# Patient Record
Sex: Male | Born: 1955 | Race: Black or African American | Hispanic: No | Marital: Single | Smoking: Never smoker
Health system: Southern US, Community
[De-identification: ages and names within clinical notes are randomized; demographics above are authoritative.]

## PROBLEM LIST (undated history)

## (undated) DIAGNOSIS — Z8719 Personal history of other diseases of the digestive system: Secondary | ICD-10-CM

## (undated) DIAGNOSIS — K859 Acute pancreatitis without necrosis or infection, unspecified: Secondary | ICD-10-CM

## (undated) DIAGNOSIS — I341 Nonrheumatic mitral (valve) prolapse: Secondary | ICD-10-CM

## (undated) DIAGNOSIS — G8929 Other chronic pain: Secondary | ICD-10-CM

## (undated) DIAGNOSIS — Z9289 Personal history of other medical treatment: Secondary | ICD-10-CM

## (undated) DIAGNOSIS — E785 Hyperlipidemia, unspecified: Secondary | ICD-10-CM

## (undated) DIAGNOSIS — R011 Cardiac murmur, unspecified: Secondary | ICD-10-CM

## (undated) DIAGNOSIS — I10 Essential (primary) hypertension: Secondary | ICD-10-CM

## (undated) DIAGNOSIS — D472 Monoclonal gammopathy: Secondary | ICD-10-CM

## (undated) DIAGNOSIS — S129XXA Fracture of neck, unspecified, initial encounter: Secondary | ICD-10-CM

## (undated) DIAGNOSIS — Z87448 Personal history of other diseases of urinary system: Secondary | ICD-10-CM

## (undated) DIAGNOSIS — D649 Anemia, unspecified: Secondary | ICD-10-CM

## (undated) DIAGNOSIS — R519 Headache, unspecified: Secondary | ICD-10-CM

## (undated) DIAGNOSIS — Z9889 Other specified postprocedural states: Secondary | ICD-10-CM

## (undated) DIAGNOSIS — G4730 Sleep apnea, unspecified: Secondary | ICD-10-CM

## (undated) DIAGNOSIS — I4891 Unspecified atrial fibrillation: Secondary | ICD-10-CM

## (undated) DIAGNOSIS — Z9981 Dependence on supplemental oxygen: Secondary | ICD-10-CM

## (undated) DIAGNOSIS — R51 Headache: Secondary | ICD-10-CM

## (undated) DIAGNOSIS — F419 Anxiety disorder, unspecified: Secondary | ICD-10-CM

## (undated) DIAGNOSIS — F1120 Opioid dependence, uncomplicated: Secondary | ICD-10-CM

## (undated) DIAGNOSIS — F32A Depression, unspecified: Secondary | ICD-10-CM

## (undated) DIAGNOSIS — C9000 Multiple myeloma not having achieved remission: Secondary | ICD-10-CM

## (undated) DIAGNOSIS — J9690 Respiratory failure, unspecified, unspecified whether with hypoxia or hypercapnia: Secondary | ICD-10-CM

## (undated) DIAGNOSIS — I509 Heart failure, unspecified: Secondary | ICD-10-CM

## (undated) DIAGNOSIS — M1990 Unspecified osteoarthritis, unspecified site: Secondary | ICD-10-CM

## (undated) DIAGNOSIS — I5022 Chronic systolic (congestive) heart failure: Secondary | ICD-10-CM

## (undated) DIAGNOSIS — F329 Major depressive disorder, single episode, unspecified: Secondary | ICD-10-CM

## (undated) DIAGNOSIS — E119 Type 2 diabetes mellitus without complications: Secondary | ICD-10-CM

## (undated) HISTORY — DX: Hyperlipidemia, unspecified: E78.5

## (undated) HISTORY — DX: Personal history of other diseases of the digestive system: Z87.19

## (undated) HISTORY — DX: Respiratory failure, unspecified, unspecified whether with hypoxia or hypercapnia: J96.90

## (undated) HISTORY — DX: Opioid dependence, uncomplicated: F11.20

## (undated) HISTORY — DX: Morbid (severe) obesity due to excess calories: E66.01

## (undated) HISTORY — PX: PANCREAS SURGERY: SHX731

## (undated) HISTORY — PX: HERNIA REPAIR: SHX51

## (undated) HISTORY — PX: CHOLECYSTECTOMY: SHX55

## (undated) HISTORY — DX: Acute pancreatitis without necrosis or infection, unspecified: K85.90

## (undated) HISTORY — DX: Personal history of other diseases of urinary system: Z87.448

## (undated) HISTORY — PX: VENTRAL HERNIA REPAIR: SHX424

## (undated) HISTORY — DX: Other chronic pain: G89.29

## (undated) HISTORY — DX: Other specified postprocedural states: Z98.890

## (undated) HISTORY — DX: Heart failure, unspecified: I50.9

## (undated) HISTORY — DX: Unspecified osteoarthritis, unspecified site: M19.90

## (undated) HISTORY — DX: Cardiac murmur, unspecified: R01.1

## (undated) HISTORY — DX: Fracture of neck, unspecified, initial encounter: S12.9XXA

## (undated) HISTORY — PX: SKIN GRAFT: SHX250

## (undated) HISTORY — PX: CATARACT EXTRACTION W/ INTRAOCULAR LENS  IMPLANT, BILATERAL: SHX1307

## (undated) HISTORY — DX: Unspecified atrial fibrillation: I48.91

---

## 2005-03-23 DIAGNOSIS — Z9289 Personal history of other medical treatment: Secondary | ICD-10-CM

## 2005-03-23 HISTORY — PX: TRACHEOSTOMY: SUR1362

## 2005-03-23 HISTORY — DX: Personal history of other medical treatment: Z92.89

## 2006-10-22 HISTORY — PX: INSERTION OF MESH: SHX5868

## 2009-09-23 ENCOUNTER — Inpatient Hospital Stay (HOSPITAL_COMMUNITY): Admission: EM | Admit: 2009-09-23 | Discharge: 2009-09-27 | Payer: Self-pay | Admitting: Emergency Medicine

## 2010-02-12 ENCOUNTER — Encounter (INDEPENDENT_AMBULATORY_CARE_PROVIDER_SITE_OTHER): Payer: Self-pay | Admitting: *Deleted

## 2010-02-12 ENCOUNTER — Inpatient Hospital Stay (HOSPITAL_COMMUNITY): Admission: EM | Admit: 2010-02-12 | Discharge: 2010-02-17 | Payer: Self-pay | Admitting: Emergency Medicine

## 2010-02-17 ENCOUNTER — Encounter (INDEPENDENT_AMBULATORY_CARE_PROVIDER_SITE_OTHER): Payer: Self-pay | Admitting: *Deleted

## 2010-03-03 ENCOUNTER — Telehealth: Payer: Self-pay | Admitting: Internal Medicine

## 2010-03-11 ENCOUNTER — Inpatient Hospital Stay (HOSPITAL_COMMUNITY)
Admission: EM | Admit: 2010-03-11 | Discharge: 2010-03-18 | Disposition: A | Discharge status: Other Acute Inpatient Hospital | Payer: Self-pay | Source: Home / Self Care | Attending: Internal Medicine | Admitting: Internal Medicine

## 2010-03-13 ENCOUNTER — Encounter (INDEPENDENT_AMBULATORY_CARE_PROVIDER_SITE_OTHER): Payer: Self-pay | Admitting: Internal Medicine

## 2010-04-24 NOTE — Discharge Summary (Signed)
Summary: dc summary   Don Brown, Don Brown                 ACCOUNT NO.:  [id number]      MEDICAL RECORD NO.:  [id number]          PATIENT TYPE:  INP      LOCATION:  1336                         FACILITY:  [facility]      PHYSICIAN:  Erick Blinks, MD     DATE OF BIRTH:  July 08, 1955      DATE OF ADMISSION:  02/12/2010   DATE OF DISCHARGE:  02/17/2010                            DISCHARGE SUMMARY - REFERRING         DISCHARGE DIAGNOSES:   1. Acute-on-chronic pancreatitis.   2. Acute gastritis.   3. Chronic pancreatic pseudocyst.   4. Hypertension.   5. Chronic atrial fibrillation.   6. History of congestive heart failure, type unknown, compensated.   7. Acute-on-chronic pain.      DISCHARGE DIAGNOSES:   1. Protonix 40 mg p.o. q.h.s.   2. Metoprolol XL succinate 50 mg p.o. q.a.m.   3. Digoxin 0.125 mg 1 tablet p.o. daily.   4. Tramadol 50 mg p.o. t.i.d. p.r.n.   5. Pancrease 10 1 capsule p.o. t.i.d. with meals.   6. Vicodin 5/500 mg 1-2 tablets p.o. q.6 h. p.r.n.   7. Lasix 40 mg 1 tablet p.o. q.a.m.   8. Lisinopril 10 mg p.o. daily.   9. Advil 200 mg 2 tablets p.o. q.6 h. p.r.n.      BRIEF ADMISSION HISTORY:  This is a 55 year old gentleman with history   of chronic pancreatitis and chronic atrial fibrillation, who presents to   the hospital with abdominal pain.  The patient woke up on the morning of   admission with significant abdominal pain, which was diffuse in nature,   but more so located on the left side.  He reports that this is a regular   pancreatitis pain.  The patient was having nausea as well.  He reported   to the ER, where CT scan was done which showed possible pancreatitis.  He   also had an elevated lipase level.  He was subsequently admitted   for further evaluation.  For further details, please refer the history   and physical dictated on November 23rd.      HOSPITAL COURSE:   1. Acute-on-chronic pancreatitis.  The patient was initially kept       n.p.o., was  given IV fluids.  His diet was subsequently advanced.       His lipase returned to normal level.  He was able to tolerate p.o.       diet and his pancreatitis subsequently resolved.   2. Chronic pancreatic pseudocyst.  This was found on CT scan.  The       patient reports that since his last discharge in July where he was       found to have a pseudocyst of 7 cm.  He had followed up at [facility] with his primary gastroenterologist.  He       reports that pancreatic stent was placed at that time and his cyst  had completely resolved.  Since that time, his stent was removed       and upon presentation during this hospitalization, CT abdomen shows       a recurrence of his pseudocyst at approximately 2.9 x 2.2 cm.  This       was discussed with his primary gastroenterologist at [facility].  It was recommended that once the patient is       discharged from the hospital, he should have a repeat CT scan in       the next 3-4 weeks to evaluate to see if the pseudocyst is       progressively enlarging.  It was recommended that if he wishes he       could follow up at [place] although another option will be to       follow up at [facility] where another stent could       be placed for his pseudocyst if necessary.  This was explained to       the patient.  He does understand this.  A copy of the CT scan       report was also given him.   3. Acute-on-chronic pain.  The patient has significant pain.  He was       using IV narcotics to relieve this.  He was advised that as this       pain began to improve, he should be transitioned over to oral       narcotics.  The patient did not agree to do this.  He adamantly       requested IV pain medicine while he was here in the hospital.  He       was explained that it would not be in his benefit to continue with       IV pain medication as he cannot be discharged home on this.  He        reported that he would deal with oral pain medication when he went       home, but while he was here in the hospital he demanded IV pain       medication.  He said that the oral pain medication made him       increasingly groggy.  A smaller dose of oral pain medication was       offered.  The patient did not want this either.  During his       hospital course, the patient often refused frequent tests.  He       refused any blood draws, any blood cultures.  He refused an x-ray       when he was found to have a mild temperature.  The patient       repeatedly asked for his IV pain medication.  On November 28th on       the day of discharge, the patient was offered only p.o. pain       medication for his pain.  He requested discharge on this day.  The       patient is tolerating an oral diet.  He is consuming 100% of his       tray.  He is ambulating without any assistance.  His vitals were       stable.  His lab work did not show any acute abnormalities and he       appears to be back to  his baseline.  We will discharge him home       today to follow up with the primary care physician and he will be       discharged on oral narcotics.   4. Gastritis.  The patient was maintained on a proton pump inhibitor       while here in the hospital.   5. Chronic atrial fibrillation.  This had remained rate control during       his hospital stay.  The patient is not on Coumadin.      CONSULTATIONS:  None.      PROCEDURES:  None.      DIAGNOSTIC IMAGING:  CT abdomen, pelvis from November 23rd shows a   similar appearance of the pancreatic body and tail favored to represent   findings of chronic calcific pancreatitis given relative normal   appearance of pancreatic head, suspected stricture at the pancreatic   neck, decreased size of peripancreatic fluid, dense lesion likely a   pseudocyst, moderate gastric wall thickening, highly suspicious for   gastritis, mild edema adjacent to the gastric antrum  and pancreatic   neck, although this could be chronic, a component of acute pancreatitis   cannot be excluded, chronic insufficiency/thrombus in the splenic vein   and splenoportal confluence with increase in gastroepiploic collaterals,   hepatic steatosis with probable perfusion anomaly, mild small bowel   adynamic ileus with similar small bowel containing ventral abdominal   wall laxity, suspect LAD coronary artery atherosclerosis.      DISCHARGE INSTRUCTIONS:  The patient should continue on a low-calorie,   low-fat diet.  Conduct his activity as tolerated.  He will need to   follow up with a primary care physician in the next 1-2 weeks.  He will   also need to follow up with his gastroenterologist with a repeat CT scan   in the next 3-4 weeks if he chooses.  He can also follow up at [facility] for the same reason.  Time spent in coordinating this   discharge is 45 minutes.               Erick Blinks, MD            JM/MEDQ  D:  02/17/2010  T:  02/17/2010  Job:  [id number]      Electronically Signed by Durward Mallard MEMON  on 02/26/2010 08:33:00 PM

## 2010-04-24 NOTE — Progress Notes (Signed)
Summary: triage  Phone Note Call from Patient Call back at [phone]   Caller: Patient Call For: Dr. Juanda Chance (doctor of the day) Reason for Call: Talk to Nurse Summary of Call: pt was recently d/c'ed from [facility] with pancreatic problems... was advised to f/u with GI specialist... no GI hx in [place]... says January is too far away for his condition and being out of work due to problem Initial call taken by: Vallarie Mare,  March 03, 2010 3:28 PM  Follow-up for Phone Call        Spoke with pt and retrieved his recent hospital records from Nov., 2011. Also found records of a consult with Dr Evette Cristal from July, 2011. Informed the patient we cannot see him since he was seen at [facility] this year. Pt was very gracious and will call Dr Luan Moore office. Graciella Freer, RN 03/03/10 @ 1615 Follow-up by: Jesse Fall RN,  March 03, 2010 4:18 PM

## 2010-06-02 LAB — COMPREHENSIVE METABOLIC PANEL
ALT: 12 U/L (ref 0–53)
BUN: 9 mg/dL (ref 6–23)
CO2: 27 mEq/L (ref 19–32)
CO2: 29 mEq/L (ref 19–32)
Calcium: 8.2 mg/dL — ABNORMAL LOW (ref 8.4–10.5)
Calcium: 9.1 mg/dL (ref 8.4–10.5)
Chloride: 111 mEq/L (ref 96–112)
Creatinine, Ser: 0.82 mg/dL (ref 0.4–1.5)
Creatinine, Ser: 0.84 mg/dL (ref 0.4–1.5)
GFR calc non Af Amer: >60 mL/min (ref >60)
GFR calc non Af Amer: >60 mL/min (ref >60)
Glucose, Bld: 102 mg/dL — ABNORMAL HIGH (ref 70–99)
Glucose, Bld: 80 mg/dL (ref 70–99)
Sodium: 144 mEq/L (ref 135–145)
Sodium: 145 mEq/L (ref 135–145)
Total Bilirubin: 0.8 mg/dL (ref 0.3–1.2)
Total Protein: 6.6 g/dL (ref 6.0–8.3)

## 2010-06-02 LAB — CBC
HCT: 35.8 % — ABNORMAL LOW (ref 39.0–52.0)
HCT: 42.1 % (ref 39.0–52.0)
Hemoglobin: 11.3 g/dL — ABNORMAL LOW (ref 13.0–17.0)
Hemoglobin: 11.6 g/dL — ABNORMAL LOW (ref 13.0–17.0)
Hemoglobin: 12.7 g/dL — ABNORMAL LOW (ref 13.0–17.0)
Hemoglobin: 13.4 g/dL (ref 13.0–17.0)
MCH: 28 pg (ref 26.0–34.0)
MCH: 28.1 pg (ref 26.0–34.0)
MCH: 28.5 pg (ref 26.0–34.0)
MCHC: 31.3 g/dL (ref 30.0–36.0)
MCHC: 31.6 g/dL (ref 30.0–36.0)
MCHC: 31.8 g/dL (ref 30.0–36.0)
MCV: 88.6 fL (ref 78.0–100.0)
Platelets: 126 10*3/uL — ABNORMAL LOW (ref 150–400)
Platelets: 99 10*3/uL — ABNORMAL LOW (ref 150–400)
RBC: 4.19 MIL/uL — ABNORMAL LOW (ref 4.22–5.81)
RBC: 4.46 MIL/uL (ref 4.22–5.81)
RDW: 16.2 % — ABNORMAL HIGH (ref 11.5–15.5)
RDW: 17 % — ABNORMAL HIGH (ref 11.5–15.5)
WBC: 3.2 10*3/uL — ABNORMAL LOW (ref 4.0–10.5)
WBC: 3.9 10*3/uL — ABNORMAL LOW (ref 4.0–10.5)

## 2010-06-02 LAB — BASIC METABOLIC PANEL
BUN: 3 mg/dL — ABNORMAL LOW (ref 6–23)
CO2: 34 mEq/L — ABNORMAL HIGH (ref 19–32)
Calcium: 8.4 mg/dL (ref 8.4–10.5)
Calcium: 8.6 mg/dL (ref 8.4–10.5)
Creatinine, Ser: 0.91 mg/dL (ref 0.4–1.5)
GFR calc Af Amer: >60 mL/min (ref >60)
GFR calc non Af Amer: >60 mL/min (ref >60)
GFR calc non Af Amer: >60 mL/min (ref >60)
Glucose, Bld: 102 mg/dL — ABNORMAL HIGH (ref 70–99)
Potassium: 3.7 mEq/L (ref 3.5–5.1)
Sodium: 142 mEq/L (ref 135–145)
Sodium: 143 mEq/L (ref 135–145)

## 2010-06-02 LAB — URINALYSIS, ROUTINE W REFLEX MICROSCOPIC
Bilirubin Urine: NEGATIVE
Glucose, UA: NEGATIVE mg/dL
Hgb urine dipstick: NEGATIVE
Ketones, ur: NEGATIVE mg/dL
pH: 5.5 (ref 5.0–8.0)

## 2010-06-02 LAB — LACTIC ACID, PLASMA: Lactic Acid, Venous: 1 mmol/L (ref 0.5–2.2)

## 2010-06-02 LAB — LIPASE, BLOOD
Lipase: 145 U/L — ABNORMAL HIGH (ref 11–59)
Lipase: 427 U/L — ABNORMAL HIGH (ref 11–59)
Lipase: 46 U/L (ref 11–59)

## 2010-06-02 LAB — BRAIN NATRIURETIC PEPTIDE: Pro B Natriuretic peptide (BNP): 528 pg/mL — ABNORMAL HIGH (ref 0.0–100.0)

## 2010-06-02 LAB — DIFFERENTIAL
Basophils Absolute: 0 10*3/uL (ref 0.0–0.1)
Eosinophils Absolute: 0.1 10*3/uL (ref 0.0–0.7)
Lymphs Abs: 0.7 10*3/uL (ref 0.7–4.0)
Neutrophils Relative %: 69 % (ref 43–77)

## 2010-06-02 LAB — POCT CARDIAC MARKERS: Troponin i, poc: <0.05 ng/mL (ref 0.00–0.09)

## 2010-06-02 LAB — MAGNESIUM: Magnesium: 1.6 mg/dL (ref 1.5–2.5)

## 2010-06-03 LAB — CBC
HCT: 43 % (ref 39.0–52.0)
Hemoglobin: 12.5 g/dL — ABNORMAL LOW (ref 13.0–17.0)
Hemoglobin: 14.2 g/dL (ref 13.0–17.0)
MCH: 28.3 pg (ref 26.0–34.0)
MCH: 28.5 pg (ref 26.0–34.0)
MCH: 28.8 pg (ref 26.0–34.0)
MCHC: 32.5 g/dL (ref 30.0–36.0)
MCHC: 32.9 g/dL (ref 30.0–36.0)
MCV: 86.5 fL (ref 78.0–100.0)
MCV: 86.7 fL (ref 78.0–100.0)
MCV: 87.3 fL (ref 78.0–100.0)
Platelets: 124 10*3/uL — ABNORMAL LOW (ref 150–400)
Platelets: 151 10*3/uL (ref 150–400)
RBC: 4.34 MIL/uL (ref 4.22–5.81)
RBC: 4.67 MIL/uL (ref 4.22–5.81)
RDW: 17.9 % — ABNORMAL HIGH (ref 11.5–15.5)
RDW: 18 % — ABNORMAL HIGH (ref 11.5–15.5)
WBC: 3.2 10*3/uL — ABNORMAL LOW (ref 4.0–10.5)

## 2010-06-03 LAB — BASIC METABOLIC PANEL
BUN: 6 mg/dL (ref 6–23)
Calcium: 8 mg/dL — ABNORMAL LOW (ref 8.4–10.5)
Chloride: 109 mEq/L (ref 96–112)
Creatinine, Ser: 0.77 mg/dL (ref 0.4–1.5)

## 2010-06-03 LAB — COMPREHENSIVE METABOLIC PANEL
AST: 16 U/L (ref 0–37)
AST: 18 U/L (ref 0–37)
Albumin: 3.4 g/dL — ABNORMAL LOW (ref 3.5–5.2)
CO2: 28 mEq/L (ref 19–32)
Calcium: 8.3 mg/dL — ABNORMAL LOW (ref 8.4–10.5)
Chloride: 101 mEq/L (ref 96–112)
Chloride: 105 mEq/L (ref 96–112)
Creatinine, Ser: 0.98 mg/dL (ref 0.4–1.5)
Creatinine, Ser: 1.05 mg/dL (ref 0.4–1.5)
GFR calc Af Amer: >60 mL/min (ref >60)
GFR calc Af Amer: >60 mL/min (ref >60)
GFR calc non Af Amer: >60 mL/min (ref >60)
Sodium: 140 mEq/L (ref 135–145)
Total Bilirubin: 0.8 mg/dL (ref 0.3–1.2)

## 2010-06-03 LAB — LIPASE, BLOOD
Lipase: 132 U/L — ABNORMAL HIGH (ref 11–59)
Lipase: 674 U/L — ABNORMAL HIGH (ref 11–59)

## 2010-06-03 LAB — URINALYSIS, ROUTINE W REFLEX MICROSCOPIC
Bilirubin Urine: NEGATIVE
Glucose, UA: NEGATIVE mg/dL
Glucose, UA: NEGATIVE mg/dL
Hgb urine dipstick: NEGATIVE
Hgb urine dipstick: NEGATIVE
Leukocytes, UA: NEGATIVE
Nitrite: NEGATIVE
Protein, ur: 30 mg/dL — AB
Specific Gravity, Urine: 1.01 (ref 1.005–1.030)
Specific Gravity, Urine: >1.046 — ABNORMAL HIGH (ref 1.005–1.030)
Urobilinogen, UA: 1 mg/dL (ref 0.0–1.0)
pH: 6 (ref 5.0–8.0)

## 2010-06-03 LAB — CULTURE, BLOOD (ROUTINE X 2)
Culture  Setup Time: 201111251051
Culture: NO GROWTH

## 2010-06-03 LAB — DIFFERENTIAL
Basophils Absolute: 0 10*3/uL (ref 0.0–0.1)
Basophils Relative: 1 % (ref 0–1)
Eosinophils Absolute: 0.1 10*3/uL (ref 0.0–0.7)
Eosinophils Relative: 1 % (ref 0–5)
Monocytes Absolute: 0.2 10*3/uL (ref 0.1–1.0)
Monocytes Relative: 6 % (ref 3–12)

## 2010-06-03 LAB — HEPATIC FUNCTION PANEL
Albumin: 3.5 g/dL (ref 3.5–5.2)
Alkaline Phosphatase: 52 U/L (ref 39–117)
Bilirubin, Direct: 0.3 mg/dL (ref 0.0–0.3)
Indirect Bilirubin: 0.6 mg/dL (ref 0.3–0.9)
Total Bilirubin: 0.9 mg/dL (ref 0.3–1.2)

## 2010-06-03 LAB — URINE CULTURE: Special Requests: NEGATIVE

## 2010-06-03 LAB — URINE MICROSCOPIC-ADD ON

## 2010-06-08 LAB — COMPREHENSIVE METABOLIC PANEL
ALT: 17 U/L (ref 0–53)
AST: 12 U/L (ref 0–37)
AST: 20 U/L (ref 0–37)
AST: 20 U/L (ref 0–37)
Albumin: 3.3 g/dL — ABNORMAL LOW (ref 3.5–5.2)
Albumin: 3.7 g/dL (ref 3.5–5.2)
Alkaline Phosphatase: 47 U/L (ref 39–117)
Alkaline Phosphatase: 53 U/L (ref 39–117)
BUN: 9 mg/dL (ref 6–23)
CO2: 29 mEq/L (ref 19–32)
Calcium: 9.3 mg/dL (ref 8.4–10.5)
Chloride: 107 mEq/L (ref 96–112)
Chloride: 110 mEq/L (ref 96–112)
Creatinine, Ser: 0.77 mg/dL (ref 0.4–1.5)
Creatinine, Ser: 0.83 mg/dL (ref 0.4–1.5)
GFR calc Af Amer: >60 mL/min (ref >60)
GFR calc Af Amer: >60 mL/min (ref >60)
GFR calc Af Amer: >60 mL/min (ref >60)
GFR calc non Af Amer: >60 mL/min (ref >60)
Potassium: 3.8 mEq/L (ref 3.5–5.1)
Potassium: 4.1 mEq/L (ref 3.5–5.1)
Sodium: 142 mEq/L (ref 135–145)
Total Bilirubin: 0.7 mg/dL (ref 0.3–1.2)
Total Protein: 7.5 g/dL (ref 6.0–8.3)
Total Protein: 8.6 g/dL — ABNORMAL HIGH (ref 6.0–8.3)

## 2010-06-08 LAB — CBC
Hemoglobin: 12.1 g/dL — ABNORMAL LOW (ref 13.0–17.0)
MCH: 28.5 pg (ref 26.0–34.0)
MCHC: 33.3 g/dL (ref 30.0–36.0)
MCV: 87.7 fL (ref 78.0–100.0)
Platelets: 126 10*3/uL — ABNORMAL LOW (ref 150–400)
Platelets: 146 10*3/uL — ABNORMAL LOW (ref 150–400)
RBC: 4.26 MIL/uL (ref 4.22–5.81)
RBC: 4.36 MIL/uL (ref 4.22–5.81)
RBC: 4.96 MIL/uL (ref 4.22–5.81)
RDW: 16.4 % — ABNORMAL HIGH (ref 11.5–15.5)
RDW: 16.9 % — ABNORMAL HIGH (ref 11.5–15.5)
WBC: 4.2 10*3/uL (ref 4.0–10.5)

## 2010-06-08 LAB — BLOOD GAS, ARTERIAL
Acid-base deficit: 1.9 mmol/L (ref 0.0–2.0)
Bicarbonate: 27.4 mEq/L — ABNORMAL HIGH (ref 20.0–24.0)
Bicarbonate: 29.3 mEq/L — ABNORMAL HIGH (ref 20.0–24.0)
Drawn by: 275531
FIO2: 0.21 %
FIO2: 100 %
O2 Saturation: 92.6 %
O2 Saturation: 98.3 %
TCO2: 27.6 mmol/L (ref 0–100)
pCO2 arterial: 38.9 mmHg (ref 35.0–45.0)
pCO2 arterial: 85.6 mmHg (ref 35.0–45.0)
pH, Arterial: 7.462 — ABNORMAL HIGH (ref 7.350–7.450)
pO2, Arterial: 184 mmHg — ABNORMAL HIGH (ref 80.0–100.0)
pO2, Arterial: 60.4 mmHg — ABNORMAL LOW (ref 80.0–100.0)

## 2010-06-08 LAB — LIPID PANEL
Cholesterol: 98 mg/dL (ref 0–200)
HDL: 29 mg/dL — ABNORMAL LOW (ref >39)
Total CHOL/HDL Ratio: 3.4 RATIO
Triglycerides: 81 mg/dL (ref <150)

## 2010-06-08 LAB — URINALYSIS, ROUTINE W REFLEX MICROSCOPIC
Bilirubin Urine: NEGATIVE
Glucose, UA: NEGATIVE mg/dL
Hgb urine dipstick: NEGATIVE
Specific Gravity, Urine: >1.046 — ABNORMAL HIGH (ref 1.005–1.030)
Urobilinogen, UA: 0.2 mg/dL (ref 0.0–1.0)

## 2010-06-08 LAB — MAGNESIUM
Magnesium: 2 mg/dL (ref 1.5–2.5)
Magnesium: 2.2 mg/dL (ref 1.5–2.5)

## 2010-06-08 LAB — DIFFERENTIAL
Eosinophils Relative: 0 % (ref 0–5)
Lymphocytes Relative: 9 % — ABNORMAL LOW (ref 12–46)
Lymphs Abs: 0.6 10*3/uL — ABNORMAL LOW (ref 0.7–4.0)
Monocytes Absolute: 0.1 10*3/uL (ref 0.1–1.0)
Monocytes Relative: 2 % — ABNORMAL LOW (ref 3–12)

## 2010-06-08 LAB — GLUCOSE, CAPILLARY: Glucose-Capillary: 132 mg/dL — ABNORMAL HIGH (ref 70–99)

## 2010-06-08 LAB — URINE MICROSCOPIC-ADD ON

## 2010-06-08 LAB — MRSA PCR SCREENING: MRSA by PCR: NEGATIVE

## 2010-06-08 LAB — CARDIAC PANEL(CRET KIN+CKTOT+MB+TROPI): Relative Index: INVALID (ref 0.0–2.5)

## 2010-06-08 LAB — URINE CULTURE
Colony Count: NO GROWTH
Culture: NO GROWTH
Special Requests: NEGATIVE

## 2010-06-08 LAB — HEMOGLOBIN A1C: Mean Plasma Glucose: 126 mg/dL — ABNORMAL HIGH (ref <117)

## 2010-08-28 ENCOUNTER — Other Ambulatory Visit: Payer: Self-pay | Admitting: Oncology

## 2010-08-28 ENCOUNTER — Encounter (HOSPITAL_BASED_OUTPATIENT_CLINIC_OR_DEPARTMENT_OTHER): Payer: 59 | Admitting: Oncology

## 2010-08-28 DIAGNOSIS — D61818 Other pancytopenia: Secondary | ICD-10-CM

## 2010-08-28 DIAGNOSIS — D72819 Decreased white blood cell count, unspecified: Secondary | ICD-10-CM

## 2010-08-28 DIAGNOSIS — D649 Anemia, unspecified: Secondary | ICD-10-CM

## 2010-08-28 LAB — CBC WITH DIFFERENTIAL/PLATELET
Eosinophils Absolute: 0.1 10*3/uL (ref 0.0–0.5)
LYMPH%: 23.6 % (ref 14.0–49.0)
MONO#: 0.2 10*3/uL (ref 0.1–0.9)
NEUT#: 2 10*3/uL (ref 1.5–6.5)
Platelets: 139 10*3/uL — ABNORMAL LOW (ref 140–400)
RBC: 3.95 10*6/uL — ABNORMAL LOW (ref 4.20–5.82)
WBC: 3 10*3/uL — ABNORMAL LOW (ref 4.0–10.3)

## 2010-08-28 LAB — COMPREHENSIVE METABOLIC PANEL
ALT: 11 U/L (ref 0–53)
Albumin: 3.9 g/dL (ref 3.5–5.2)
CO2: 24 mEq/L (ref 19–32)
Calcium: 8.6 mg/dL (ref 8.4–10.5)
Chloride: 108 mEq/L (ref 96–112)
Creatinine, Ser: 0.84 mg/dL (ref 0.50–1.35)
Sodium: 141 mEq/L (ref 135–145)
Total Protein: 7.6 g/dL (ref 6.0–8.3)

## 2010-08-28 LAB — IRON AND TIBC
%SAT: 4 % — ABNORMAL LOW (ref 20–55)
Iron: 17 ug/dL — ABNORMAL LOW (ref 42–165)
TIBC: 415 ug/dL (ref 215–435)
UIBC: 398 ug/dL

## 2010-08-28 LAB — FERRITIN: Ferritin: 12 ng/mL — ABNORMAL LOW (ref 22–322)

## 2010-08-28 LAB — CHCC SMEAR

## 2010-09-16 ENCOUNTER — Other Ambulatory Visit: Payer: Self-pay | Admitting: Orthopedic Surgery

## 2010-09-16 DIAGNOSIS — M25511 Pain in right shoulder: Secondary | ICD-10-CM

## 2010-09-29 ENCOUNTER — Ambulatory Visit
Admission: RE | Admit: 2010-09-29 | Discharge: 2010-09-29 | Disposition: A | Discharge status: Home / Self Care | Payer: 59 | Source: Ambulatory Visit | Attending: Orthopedic Surgery | Admitting: Orthopedic Surgery

## 2010-09-29 DIAGNOSIS — M25511 Pain in right shoulder: Secondary | ICD-10-CM

## 2010-10-19 ENCOUNTER — Emergency Department (HOSPITAL_COMMUNITY)
Admission: EM | Admit: 2010-10-19 | Discharge: 2010-10-19 | Disposition: A | Discharge status: Other Acute Inpatient Hospital | Payer: 59 | Attending: Emergency Medicine | Admitting: Emergency Medicine

## 2010-10-19 ENCOUNTER — Emergency Department (HOSPITAL_COMMUNITY): Payer: 59

## 2010-10-19 DIAGNOSIS — R1013 Epigastric pain: Secondary | ICD-10-CM | POA: Insufficient documentation

## 2010-10-19 DIAGNOSIS — I509 Heart failure, unspecified: Secondary | ICD-10-CM | POA: Insufficient documentation

## 2010-10-19 DIAGNOSIS — Z9889 Other specified postprocedural states: Secondary | ICD-10-CM | POA: Insufficient documentation

## 2010-10-19 DIAGNOSIS — I10 Essential (primary) hypertension: Secondary | ICD-10-CM | POA: Insufficient documentation

## 2010-10-19 DIAGNOSIS — K859 Acute pancreatitis without necrosis or infection, unspecified: Secondary | ICD-10-CM | POA: Insufficient documentation

## 2010-10-19 LAB — COMPREHENSIVE METABOLIC PANEL
AST: 11 U/L (ref 0–37)
BUN: 19 mg/dL (ref 6–23)
CO2: 29 mEq/L (ref 19–32)
Calcium: 9.2 mg/dL (ref 8.4–10.5)
Chloride: 102 mEq/L (ref 96–112)
Creatinine, Ser: 1.04 mg/dL (ref 0.50–1.35)
GFR calc non Af Amer: >60 mL/min (ref >60)
Total Bilirubin: 0.7 mg/dL (ref 0.3–1.2)

## 2010-10-19 LAB — URINALYSIS, ROUTINE W REFLEX MICROSCOPIC
Bilirubin Urine: NEGATIVE
Glucose, UA: NEGATIVE mg/dL
Hgb urine dipstick: NEGATIVE
Ketones, ur: NEGATIVE mg/dL
Protein, ur: 30 mg/dL — AB
pH: 6 (ref 5.0–8.0)

## 2010-10-19 LAB — POCT I-STAT, CHEM 8
BUN: 20 mg/dL (ref 6–23)
Calcium, Ion: 1.16 mmol/L (ref 1.12–1.32)
Chloride: 104 mEq/L (ref 96–112)
Creatinine, Ser: 1.1 mg/dL (ref 0.50–1.35)
Glucose, Bld: 136 mg/dL — ABNORMAL HIGH (ref 70–99)
TCO2: 28 mmol/L (ref 0–100)

## 2010-10-19 LAB — CBC
HCT: 40.3 % (ref 39.0–52.0)
MCH: 26.5 pg (ref 26.0–34.0)
MCV: 84 fL (ref 78.0–100.0)
Platelets: 106 10*3/uL — ABNORMAL LOW (ref 150–400)
RDW: 17.2 % — ABNORMAL HIGH (ref 11.5–15.5)
WBC: 6.5 10*3/uL (ref 4.0–10.5)

## 2010-10-19 LAB — DIFFERENTIAL
Basophils Relative: 0 % (ref 0–1)
Eosinophils Absolute: 0 10*3/uL (ref 0.0–0.7)
Eosinophils Relative: 0 % (ref 0–5)
Monocytes Absolute: 0.5 10*3/uL (ref 0.1–1.0)
Neutro Abs: 5.3 10*3/uL (ref 1.7–7.7)

## 2010-10-19 LAB — AMYLASE: Amylase: 53 U/L (ref 0–105)

## 2010-10-19 LAB — PROTIME-INR: Prothrombin Time: 14.9 seconds (ref 11.6–15.2)

## 2010-10-19 LAB — URINE MICROSCOPIC-ADD ON

## 2010-10-19 LAB — LIPASE, BLOOD: Lipase: 37 U/L (ref 11–59)

## 2010-10-19 MED ORDER — IOHEXOL 300 MG/ML  SOLN
125.0000 mL | Freq: Once | INTRAMUSCULAR | Status: AC | PRN
  Administered 2010-10-19: 125 mL via INTRAVENOUS

## 2010-10-29 ENCOUNTER — Ambulatory Visit
Admission: RE | Admit: 2010-10-29 | Discharge: 2010-10-29 | Disposition: A | Discharge status: Home / Self Care | Payer: 59 | Source: Ambulatory Visit | Attending: Family Medicine | Admitting: Family Medicine

## 2010-10-29 ENCOUNTER — Other Ambulatory Visit: Payer: Self-pay | Admitting: Family Medicine

## 2010-10-29 DIAGNOSIS — K861 Other chronic pancreatitis: Secondary | ICD-10-CM

## 2010-10-29 MED ORDER — GADOBENATE DIMEGLUMINE 529 MG/ML IV SOLN: 20.0000 mL | Freq: Once | INTRAVENOUS | Status: AC | PRN

## 2010-11-04 ENCOUNTER — Other Ambulatory Visit: Payer: Self-pay | Admitting: Oncology

## 2010-11-04 ENCOUNTER — Encounter (HOSPITAL_BASED_OUTPATIENT_CLINIC_OR_DEPARTMENT_OTHER): Payer: 59 | Admitting: Oncology

## 2010-11-04 DIAGNOSIS — D61818 Other pancytopenia: Secondary | ICD-10-CM

## 2010-11-04 DIAGNOSIS — D72819 Decreased white blood cell count, unspecified: Secondary | ICD-10-CM

## 2010-11-04 DIAGNOSIS — D649 Anemia, unspecified: Secondary | ICD-10-CM

## 2010-11-04 LAB — CBC WITH DIFFERENTIAL/PLATELET
BASO%: 0.4 % (ref 0.0–2.0)
Basophils Absolute: 0 10*3/uL (ref 0.0–0.1)
Eosinophils Absolute: 0.1 10*3/uL (ref 0.0–0.5)
HCT: 42.3 % (ref 38.4–49.9)
HGB: 13.5 g/dL (ref 13.0–17.1)
MCHC: 31.9 g/dL — ABNORMAL LOW (ref 32.0–36.0)
MONO#: 0.2 10*3/uL (ref 0.1–0.9)
NEUT#: 1.6 10*3/uL (ref 1.5–6.5)
NEUT%: 56.4 % (ref 39.0–75.0)
Platelets: 179 10*3/uL (ref 140–400)
WBC: 2.8 10*3/uL — ABNORMAL LOW (ref 4.0–10.3)
lymph#: 0.9 10*3/uL (ref 0.9–3.3)

## 2010-11-04 LAB — COMPREHENSIVE METABOLIC PANEL
AST: 19 U/L (ref 0–37)
Albumin: 3.9 g/dL (ref 3.5–5.2)
BUN: 27 mg/dL — ABNORMAL HIGH (ref 6–23)
CO2: 25 mEq/L (ref 19–32)
Calcium: 9.1 mg/dL (ref 8.4–10.5)
Chloride: 106 mEq/L (ref 96–112)
Potassium: 4 mEq/L (ref 3.5–5.3)

## 2011-01-02 ENCOUNTER — Other Ambulatory Visit: Payer: Self-pay | Admitting: Family Medicine

## 2011-01-02 DIAGNOSIS — K861 Other chronic pancreatitis: Secondary | ICD-10-CM

## 2011-01-05 ENCOUNTER — Ambulatory Visit
Admission: RE | Admit: 2011-01-05 | Discharge: 2011-01-05 | Disposition: A | Discharge status: Home / Self Care | Payer: 59 | Source: Ambulatory Visit | Attending: Family Medicine | Admitting: Family Medicine

## 2011-01-05 DIAGNOSIS — K861 Other chronic pancreatitis: Secondary | ICD-10-CM

## 2011-01-05 MED ORDER — IOHEXOL 300 MG/ML  SOLN
125.0000 mL | Freq: Once | INTRAMUSCULAR | Status: AC | PRN
  Administered 2011-01-05: 125 mL via INTRAVENOUS

## 2011-01-13 ENCOUNTER — Encounter: Payer: Self-pay | Admitting: *Deleted

## 2011-01-14 ENCOUNTER — Ambulatory Visit (INDEPENDENT_AMBULATORY_CARE_PROVIDER_SITE_OTHER): Payer: 59 | Admitting: Cardiology

## 2011-01-14 ENCOUNTER — Encounter: Payer: Self-pay | Admitting: Cardiology

## 2011-01-14 VITALS — BP 107/70 | HR 64 | Ht 72.0 in | Wt 252.0 lb

## 2011-01-14 DIAGNOSIS — I5043 Acute on chronic combined systolic (congestive) and diastolic (congestive) heart failure: Secondary | ICD-10-CM | POA: Insufficient documentation

## 2011-01-14 DIAGNOSIS — I509 Heart failure, unspecified: Secondary | ICD-10-CM

## 2011-01-14 DIAGNOSIS — I4891 Unspecified atrial fibrillation: Secondary | ICD-10-CM

## 2011-01-14 DIAGNOSIS — I38 Endocarditis, valve unspecified: Secondary | ICD-10-CM

## 2011-01-14 MED ORDER — FUROSEMIDE 40 MG PO TABS: 40.0000 mg | ORAL_TABLET | Freq: Every day | ORAL | Status: DC

## 2011-01-14 NOTE — Assessment & Plan Note (Addendum)
Based on patient's history he has chronic systolic heart failure with an ejection fraction of 35%. He does not appear to be volume overloaded on exam today although he reports increased weight gain and edema. His exam is fairly unremarkable. I recommended sodium restriction to less than 2000 mg daily. We will obtain an echocardiogram to further evaluate his left ventricular and valvular function. This may help guide his medical therapy. As regards to his question of whether his cardiac disease was a pre-existing condition prior to his enlistment I think it'll be very difficult to provide any definitive statement concerning this.

## 2011-01-14 NOTE — Assessment & Plan Note (Signed)
He reports a history of rheumatic fever as a child. He also reports mitral valve prolapse. We will followup on his echocardiogram to further define the extent of this issue.

## 2011-01-14 NOTE — Assessment & Plan Note (Signed)
His atrial fibrillation is chronic and permanent. His rate is well controlled. He has not been on chronic anticoagulation because of concerns with bleeding given his history of pancreatitis.

## 2011-01-14 NOTE — Patient Instructions (Signed)
We will schedule you for an Echocardiogram.  Continue your current medications.  Restrict your salt intake to less than 2000 mg daily.

## 2011-01-14 NOTE — Progress Notes (Signed)
Don Brown Date of Birth: 1955/12/13 Medical Record #[id number]  History of Present Illness: Don Brown is seen at the request of Dr. Alwyn Ren for a second cardiac opinion concerning his valvular heart disease. He is a 55 year old male who reports that he had rheumatic fever as a child. He states that he had a murmur that then went away and he had no cardiac symptoms. 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 He was cleared by a cardiologist prior to his enlistment. During training exercises he reports that he was on a hike in two feet of snow with full pack on when he collapsed. He was hospitalized and subsequently diagnosed with a cardiac problem and was granted a medical discharge. This all occurred in 1976. Since then he reports that he has been diagnosed with atrial fibrillation, congestive heart failure with an ejection fraction of 35%, and mitral valve prolapse. In addition to wanting our opinion concerning his cardiac treatment he is also asking [place] to comment on whether his cardiac condition was pre-existing this event in 1976. Next  Patient does have a complex medical history. In addition to his cardiac complaints he has a history of chronic, recurrent pancreatitis. He was hospitalized in 2007 with severe pancreatitis and required ventilator support and dialysis. He had a tracheostomy placed. He essentially had several stents placed in his pancreas. From a cardiac standpoint he does note that he has gained weight. He reports a 20 pound weight gain over the past 6 months. He does have complaints of increased swelling and increased shortness of breath. He denies any dizziness or syncope. He's had no chest pain. He has no history of coronary disease. He reports he has never had a cardiac catheterization performed. He previously was cared for by Dr. Donnie Brown.  Current Outpatient Prescriptions on File Prior to Visit  Medication Sig Dispense Refill  . Amylase-Lipase-Protease (PANCREASE PO) Take by mouth.        Marland Kitchen  aspirin 81 MG tablet Take 81 mg by mouth daily.        . Cyanocobalamin (VITAMIN B 12 PO) Take by mouth.        . digoxin (LANOXIN) 0.125 MG tablet Take 125 mcg by mouth daily.        . Flaxseed, Linseed, (FLAX SEEDS PO) Take by mouth.        Chilton Si Tea, Camillia sinensis, (GREEN TEA PO) Take by mouth.        Marland Kitchen HYDROcodone-acetaminophen (NORCO) 5-325 MG per tablet Take 1 tablet by mouth every 6 (six) hours as needed.        Marland Kitchen lisinopril (PRINIVIL,ZESTRIL) 10 MG tablet Take 10 mg by mouth daily.        . metoprolol (TOPROL-XL) 50 MG 24 hr tablet Take 50 mg by mouth daily.        Marland Kitchen oxyCODONE (OXYCONTIN) 20 MG 12 hr tablet Take 20 mg by mouth every 12 (twelve) hours.        . Tetrahydrozoline HCl (EYE DROPS OP) Apply to eye.        . traMADol (ULTRAM) 50 MG tablet Take 50 mg by mouth every 6 (six) hours as needed. Maximum dose= 8 tablets per day         Allergies  Allergen Reactions  . Morphine And Related     Past Medical History  Diagnosis Date  . Pancreatitis     Severe pancreatitis status post surgery with 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 status post multiple endoscopic drainage procedures and  stents   . Atrial fibrillation   . CHF (congestive heart failure)     Chronic systolic heart failure with an ejection fraction of 40%- 45%  . Morbid obesity   . Chronic pain   . Narcotic dependence, episodic use     chronic narcotic use  . Respiratory failure     History of ventilatory-dependent respiratory failure and tracheostomy   . History of renal failure     requiring hemodialysis in the past with normal renal function at this point  . History of ventral hernia repair   . Cervical compression fracture     Cervical disk disease requiring surgery  . Hypertension   . Hyperlipidemia   . Degenerative joint disease     Past Surgical History  Procedure Date  . Ventral hernia repair   . Cholecystectomy   . Tracheostomy     History  Smoking status  . Unknown If  Ever Smoked  Smokeless tobacco  . Not on file    History  Alcohol Use     Family History  Problem Relation Age of Onset  . Hypertension Mother   . Diabetes Mother   . Lymphoma Father     Review of Systems: As noted in history of present illness.  All other systems were reviewed and are negative.  Physical Exam: BP 107/70  Pulse 64  Ht 6' (1.829 m)  Wt 252 lb (114.306 kg)  BMI 34.18 kg/m2 He is an overweight male in no acute distress. He is normocephalic, atraumatic. Pupils are equal round and reactive to light accommodation. Extraocular movements are full. Sclera are clear and nonicteric. Oropharynx is clear. Neck is supple without JVD, adenopathy, thyromegaly, or bruits. Lungs are clear. Cardiac exam reveals an irregular rate and rhythm without S3. There is a grade 1/6 systolic murmur at the apex. Abdomen is obese, soft, and nontender. There is an extensive surgical scar across the entire upper abdomen. There are no masses. Femoral and pedal pulses are palpable. He has no pretibial edema or pedal edema on exam. Skin is warm and dry. He is alert and oriented x3. Cranial nerves II through XII are intact. LABORATORY DATA: ECG today demonstrates atrial fibrillation with a rate of 74 beats per minute. It is otherwise normal. CT of the abdomen and pelvis dated 01/05/2011 shows findings of chronic pancreatitis with dilatation of the pancreatic duct and a suspected stricture at the pancreatic neck. These findings were felt to be chronic. Blood work from August of 2012 showed a white count of 2800. Hemoglobin was 13.5. Platelet count 179,000. The lead was 27 with a creatinine of 1.01. All liver function studies were normal.  Assessment / Plan:

## 2011-01-16 ENCOUNTER — Ambulatory Visit (HOSPITAL_COMMUNITY): Payer: 59 | Attending: Cardiology

## 2011-01-16 DIAGNOSIS — I4891 Unspecified atrial fibrillation: Secondary | ICD-10-CM | POA: Insufficient documentation

## 2011-01-16 DIAGNOSIS — I359 Nonrheumatic aortic valve disorder, unspecified: Secondary | ICD-10-CM | POA: Insufficient documentation

## 2011-01-16 DIAGNOSIS — I509 Heart failure, unspecified: Secondary | ICD-10-CM | POA: Insufficient documentation

## 2011-01-19 ENCOUNTER — Telehealth: Payer: Self-pay | Admitting: Cardiology

## 2011-01-19 NOTE — Telephone Encounter (Signed)
Called requesting results of echo. Advised Dr. [place] was out of town. Will call him once he has reviewed.

## 2011-01-19 NOTE — Telephone Encounter (Signed)
Pt called. He wanted to know the echo results from Friday please call

## 2011-01-26 ENCOUNTER — Telehealth: Payer: Self-pay | Admitting: Cardiology

## 2011-01-26 NOTE — Telephone Encounter (Signed)
Patient returning call back to nurse.

## 2011-01-26 NOTE — Progress Notes (Signed)
lm

## 2011-01-27 ENCOUNTER — Telehealth: Payer: Self-pay | Admitting: *Deleted

## 2011-01-27 ENCOUNTER — Encounter: Payer: Self-pay | Admitting: *Deleted

## 2011-01-27 NOTE — Telephone Encounter (Signed)
Notified of echo results. Also states he needs letter for [facility] stating that if he was inducted into [employer] today that Dr. Sw[place] would not recommend induction due to his cardiac conditon.

## 2011-03-10 ENCOUNTER — Other Ambulatory Visit: Payer: Self-pay | Admitting: Oncology

## 2011-03-10 ENCOUNTER — Telehealth: Payer: Self-pay | Admitting: *Deleted

## 2011-03-10 DIAGNOSIS — D729 Disorder of white blood cells, unspecified: Secondary | ICD-10-CM

## 2011-03-10 NOTE — Telephone Encounter (Signed)
Message on voicemail from pt stating that he went to the [place] for labwork and based on the results was told he needs to be seen by Dr. Clelia Croft. Pt states he will fax the results to our office. Results received and given to MD

## 2011-03-13 ENCOUNTER — Telehealth: Payer: Self-pay | Admitting: Oncology

## 2011-03-13 NOTE — Telephone Encounter (Signed)
S/w pt today re appt for 1/25. Slot for 1/22 not available.

## 2011-04-17 ENCOUNTER — Ambulatory Visit (HOSPITAL_BASED_OUTPATIENT_CLINIC_OR_DEPARTMENT_OTHER): Payer: Non-veteran care | Admitting: Oncology

## 2011-04-17 ENCOUNTER — Other Ambulatory Visit: Payer: Non-veteran care | Admitting: Lab

## 2011-04-17 VITALS — BP 112/73 | HR 75 | Temp 97.0°F | Ht 72.0 in | Wt 258.4 lb

## 2011-04-17 DIAGNOSIS — D729 Disorder of white blood cells, unspecified: Secondary | ICD-10-CM

## 2011-04-17 DIAGNOSIS — D72819 Decreased white blood cell count, unspecified: Secondary | ICD-10-CM

## 2011-04-17 DIAGNOSIS — R799 Abnormal finding of blood chemistry, unspecified: Secondary | ICD-10-CM

## 2011-04-17 DIAGNOSIS — D509 Iron deficiency anemia, unspecified: Secondary | ICD-10-CM

## 2011-04-17 LAB — CBC WITH DIFFERENTIAL/PLATELET
Basophils Absolute: 0 10*3/uL (ref 0.0–0.1)
Eosinophils Absolute: 0.1 10*3/uL (ref 0.0–0.5)
HCT: 43.9 % (ref 38.4–49.9)
HGB: 14.4 g/dL (ref 13.0–17.1)
LYMPH%: 28.3 % (ref 14.0–49.0)
MCV: 86.6 fL (ref 79.3–98.0)
MONO%: 5.5 % (ref 0.0–14.0)
NEUT#: 2.3 10*3/uL (ref 1.5–6.5)
Platelets: 148 10*3/uL (ref 140–400)
RDW: 15.8 % — ABNORMAL HIGH (ref 11.0–14.6)

## 2011-04-17 NOTE — Progress Notes (Signed)
Hematology and Oncology Follow Up Visit  Don Brown [id number] 03/24/1955 56 y.o. 04/17/2011 9:29 AM  CC: Peyton Najjar, MD    Principle Diagnosis: 56 year old with :  1. Iron-deficiency anemia. resolved 2. Mils leukopenia, and thrombocytopenia. 3. Abnormal SPEP, work up is ongoing.   Interim History: Don Brown presents today for a followup visit.  He is a gentleman that I saw for the first time on 08/28/2010 for evaluation for pancytopenia.  At that time, his white cell count was 3000 and he had a normal differential.  His hemoglobin was 10.9 and platelet count 139.  He had a workup for his anemia that included iron deficiency workup, which did confirm the presence of an iron-deficiency anemia at that time.  His ferritin was 12, iron level was 17, saturation of 4%.  At that time, the patient was instructed to start iron supplements at 325 mg b.i.d.   He did report abdominal pain at times, grading somewhere between 2 to 7 out of 10, predominantly midepigastric, radiating to the back.  It was felt to be an acute pancreatitis  The patient presents today for a followup visit today and he is reporting at this point that his abdominal pain is improved at this point, again is 2 out of 10.  He is not reporting any toxicity associated with  iron.  He is not reporting any other complaints.  He is not reporting any melena, does not report any hematochezia.  Did not report any hemoptysis or hematemesis.  He did have an SPEP done on 12/13 at the [place] which showed a spike up to 2.5 g/dl.  Medications: I have reviewed the patient's current medications. Current outpatient prescriptions:Amylase-Lipase-Protease (PANCREASE PO), Take by mouth.  , Disp: , Rfl: ;  aspirin 81 MG tablet, Take 81 mg by mouth daily.  , Disp: , Rfl: ;  Cyanocobalamin (VITAMIN B 12 PO), Take by mouth.  , Disp: , Rfl: ;  digoxin (LANOXIN) 0.125 MG tablet, Take 125 mcg by mouth daily.  , Disp: , Rfl: ;  Flaxseed, Linseed, (FLAX SEEDS PO),  Take by mouth.  , Disp: , Rfl:  furosemide (LASIX) 40 MG tablet, Take 1 tablet (40 mg total) by mouth daily., Disp: 30 tablet, Rfl: 11;  Green Tea, Camillia sinensis, (GREEN TEA PO), Take by mouth.  , Disp: , Rfl: ;  HYDROcodone-acetaminophen (NORCO) 5-325 MG per tablet, Take 1 tablet by mouth every 6 (six) hours as needed.  , Disp: , Rfl: ;  lisinopril (PRINIVIL,ZESTRIL) 10 MG tablet, Take 10 mg by mouth daily.  , Disp: , Rfl:  metoprolol (TOPROL-XL) 50 MG 24 hr tablet, Take 50 mg by mouth daily.  , Disp: , Rfl: ;  oxyCODONE (OXYCONTIN) 15 MG TB12, Take 15 mg by mouth every 12 (twelve) hours.  , Disp: , Rfl: ;  oxyCODONE (OXYCONTIN) 20 MG 12 hr tablet, Take 20 mg by mouth every 12 (twelve) hours.  , Disp: , Rfl: ;  traMADol (ULTRAM) 50 MG tablet, Take 50 mg by mouth every 6 (six) hours as needed. Maximum dose= 8 tablets per day , Disp: , Rfl:   Allergies:  Allergies  Allergen Reactions  . Morphine And Related     Past Medical History, Surgical history, Social history, and Family History were reviewed and updated.  Review of Systems: Constitutional:  Negative for fever, chills, night sweats, anorexia, weight loss, pain. Cardiovascular: no chest pain or dyspnea on exertion Respiratory: no cough, shortness of breath, or  wheezing Neurological: no TIA or stroke symptoms Dermatological: negative ENT: negative Skin: Negative. Gastrointestinal: no abdominal pain, change in bowel habits, or black or bloody stools Genito-Urinary: no dysuria, trouble voiding, or hematuria Hematological and Lymphatic: negative Breast: negative Musculoskeletal: negative Remaining ROS negative. Physical Exam: Blood pressure 112/73, pulse 75, temperature 97 F (36.1 C), temperature source Oral, height 6' (1.829 m), weight 258 lb 6.4 oz (117.209 kg). ECOG: 1 General appearance: alert Head: Normocephalic, without obvious abnormality, atraumatic Neck: no adenopathy, no carotid bruit, no JVD, supple, symmetrical,  trachea midline and thyroid not enlarged, symmetric, no tenderness/mass/nodules Lymph nodes: Cervical, supraclavicular, and axillary nodes normal. Heart:regular rate and rhythm, S1, S2 normal, no murmur, click, rub or gallop Lung:chest clear, no wheezing, rales, normal symmetric air entry Abdomin: soft, non-tender, without masses or organomegaly EXT:no erythema, induration, or nodules   Lab Results: Lab Results  Component Value Date   WBC 3.6* 04/17/2011   HGB 14.4 04/17/2011   HCT 43.9 04/17/2011   MCV 86.6 04/17/2011   PLT 148 04/17/2011     Chemistry      Component Value Date/Time   NA 139 11/04/2010 1256   K 4.0 11/04/2010 1256   CL 106 11/04/2010 1256   CO2 25 11/04/2010 1256   BUN 27* 11/04/2010 1256   CREATININE 1.01 11/04/2010 1256      Component Value Date/Time   CALCIUM 9.1 11/04/2010 1256   ALKPHOS 59 11/04/2010 1256   AST 19 11/04/2010 1256   ALT 18 11/04/2010 1256   BILITOT 0.4 11/04/2010 1256       Impression and Plan:   This is a 56 year old gentleman with the following issues:  1. Microcytic anemia related to iron deficiency at this point.  Given the fact that he had responded very well to oral iron, his hemoglobin completely normalized.  At this point, I have recommended to Mr. Don Brown to continue on maintenance iron sulfate 325 mg daily  2. Leukocytopenia.  His absolute neutrophil count is 2300 which is with in normal range.  Differential diagnosis was discussed today, including ethnic variation versus reactive process related to his pancreatitis.  3. Thrombocytopenia has resolved at this point and is completely normal.  The reason why it was probably low last time was probably related to his iron deficiency. 4. Positive SPEP: a plasma disorder work up is ongoing. I will repeat SPEP, QIG with IF. We will obtain a 24 hour urine collection for PEP as well as a skeletal survey. I doubt we are dealing with multiple myeloma given the lack of end organ damage, but certainly this  could represent MGUS.      [facility], MD 1/25/20139:29 AM

## 2011-04-20 ENCOUNTER — Ambulatory Visit (HOSPITAL_COMMUNITY)
Admission: RE | Admit: 2011-04-20 | Discharge: 2011-04-20 | Disposition: A | Discharge status: Home / Self Care | Payer: Non-veteran care | Source: Ambulatory Visit | Attending: Oncology | Admitting: Oncology

## 2011-04-20 DIAGNOSIS — M51379 Other intervertebral disc degeneration, lumbosacral region without mention of lumbar back pain or lower extremity pain: Secondary | ICD-10-CM | POA: Insufficient documentation

## 2011-04-20 DIAGNOSIS — M129 Arthropathy, unspecified: Secondary | ICD-10-CM | POA: Insufficient documentation

## 2011-04-20 DIAGNOSIS — D7289 Other specified disorders of white blood cells: Secondary | ICD-10-CM | POA: Insufficient documentation

## 2011-04-20 DIAGNOSIS — M4120 Other idiopathic scoliosis, site unspecified: Secondary | ICD-10-CM | POA: Insufficient documentation

## 2011-04-20 DIAGNOSIS — D729 Disorder of white blood cells, unspecified: Secondary | ICD-10-CM

## 2011-04-20 DIAGNOSIS — M899 Disorder of bone, unspecified: Secondary | ICD-10-CM | POA: Insufficient documentation

## 2011-04-20 DIAGNOSIS — M5137 Other intervertebral disc degeneration, lumbosacral region: Secondary | ICD-10-CM | POA: Insufficient documentation

## 2011-04-21 LAB — SPEP & IFE WITH QIG
Albumin ELP: 46.9 % — ABNORMAL LOW (ref 55.8–66.1)
Alpha-2-Globulin: 9.7 % (ref 7.1–11.8)
IgA: 131 mg/dL (ref 68–379)
IgG (Immunoglobin G), Serum: 3120 mg/dL — ABNORMAL HIGH (ref 650–1600)
M-Spike, %: 2.18 g/dL
Total Protein, Serum Electrophoresis: 8.2 g/dL (ref 6.0–8.3)

## 2011-04-21 LAB — COMPREHENSIVE METABOLIC PANEL
ALT: 15 U/L (ref 0–53)
Alkaline Phosphatase: 56 U/L (ref 39–117)
CO2: 20 mEq/L (ref 19–32)
Creatinine, Ser: 1.16 mg/dL (ref 0.50–1.35)
Total Bilirubin: 0.6 mg/dL (ref 0.3–1.2)

## 2011-04-21 LAB — KAPPA/LAMBDA LIGHT CHAINS
Kappa free light chain: 29.4 mg/dL — ABNORMAL HIGH (ref 0.33–1.94)
Lambda Free Lght Chn: 1.74 mg/dL (ref 0.57–2.63)

## 2011-04-22 LAB — UIFE/LIGHT CHAINS/TP QN, 24-HR UR
Alpha 1, Urine: DETECTED — AB
Alpha 2, Urine: DETECTED — AB
Free Kappa Lt Chains,Ur: 86.1 mg/dL — ABNORMAL HIGH (ref 0.14–2.42)
Gamma Globulin, Urine: DETECTED — AB

## 2011-04-27 ENCOUNTER — Telehealth: Payer: Self-pay

## 2011-04-27 NOTE — Telephone Encounter (Signed)
.   [facility]   Sherald Hess with [facility] [facility] [facility] calling for last visit records,and meds,  Please fax to [phone]  Best phone for jacqueline [phone]   [facility]

## 2011-05-05 ENCOUNTER — Telehealth: Payer: Self-pay | Admitting: *Deleted

## 2011-05-05 NOTE — Telephone Encounter (Signed)
Don Brown in managed care to assist with getting a bone marrow biopsy set up at the [facility]. In [place], per dr Clelia Croft.

## 2011-05-12 ENCOUNTER — Telehealth: Payer: Self-pay | Admitting: *Deleted

## 2011-05-12 NOTE — Telephone Encounter (Signed)
CHECKED WITH ELIZABETH IN MANAGED CARE. SHE HAS PAPERS FOR DR.SHADAD TO SIGN BEFORE AN APPOINTMENT CAN BE MADE. DR.SHADAD WILL RETURN TO THE OFFICE TOMORROW. THE ABOVE INFORMATION WAS GIVEN TO PT. HE WAS INSTRUCTED TO CONTACT DR.SHADAD'S NURSE TOMORROW.

## 2011-05-18 ENCOUNTER — Other Ambulatory Visit: Payer: Self-pay | Admitting: Oncology

## 2011-05-18 DIAGNOSIS — D729 Disorder of white blood cells, unspecified: Secondary | ICD-10-CM

## 2011-05-19 ENCOUNTER — Telehealth: Payer: Self-pay | Admitting: *Deleted

## 2011-05-19 NOTE — Telephone Encounter (Signed)
Confirmed with MD that ct biopsy order placed on 05/18/11 is for a bone marrow biopsy. Verbal order given to [facility] in radiology

## 2011-05-20 ENCOUNTER — Other Ambulatory Visit: Payer: Self-pay | Admitting: Radiology

## 2011-05-22 ENCOUNTER — Other Ambulatory Visit: Payer: Self-pay | Admitting: Radiology

## 2011-05-25 ENCOUNTER — Other Ambulatory Visit: Payer: Self-pay | Admitting: Radiology

## 2011-05-25 ENCOUNTER — Encounter (HOSPITAL_COMMUNITY): Payer: Self-pay | Admitting: Pharmacy Technician

## 2011-05-25 ENCOUNTER — Telehealth: Payer: Self-pay | Admitting: *Deleted

## 2011-05-25 NOTE — Telephone Encounter (Signed)
Patient called asking about test results.  Reports had a Bone Scan on April 20, 2011 and has not heard anything.  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 told him he has a cancer called Multiple Myeloma and feels we don't have a clue.  This nurse asked about Bone Marrow Biopsy and it has been rescheduled to Thursday, 05-28-11.  Explained that he has had a bone survey not a scan and that more information is needed.  Further questions about what is the difference in a bone survey and a bone scan.  What is Mult. Myeloma, what are the causes, treatment and prognosis.  Questions gently answered.  He will be looking for more information and answers when Bx performed.    * Expressed concern about [facility] not authorizing this test.  Was told by [facility] that they could have done this test and now I have to pay for it.  Does not want to be a "lab rat".  Asked if he has [employer] and he does.  Will make sure staff is aware that all tests and treatments must be sent to the [facility] five days in advance for authorization and in emergencies a call will suffice.  Don Brown thanked me for helping and answering questions.  Will notify providers.

## 2011-05-26 ENCOUNTER — Other Ambulatory Visit (HOSPITAL_COMMUNITY): Payer: Non-veteran care

## 2011-05-26 ENCOUNTER — Telehealth: Payer: Self-pay

## 2011-05-26 NOTE — Telephone Encounter (Signed)
.  [facility] PT WOULD LIKE TO KNOW THE FIRST TIME HE WAS TOLD ABOUT HIS WBC WITH [place] PLEASE CALL [phone]

## 2011-05-27 NOTE — Telephone Encounter (Signed)
Gave pt info about WBC labs. Mailed copy of all CBCs done here per pt request, so he can show hematologist if needed.

## 2011-05-28 ENCOUNTER — Encounter (HOSPITAL_COMMUNITY): Payer: Self-pay

## 2011-05-28 ENCOUNTER — Ambulatory Visit (HOSPITAL_COMMUNITY)
Admission: RE | Admit: 2011-05-28 | Discharge: 2011-05-28 | Disposition: A | Discharge status: Home / Self Care | Payer: Non-veteran care | Source: Ambulatory Visit | Attending: Oncology | Admitting: Oncology

## 2011-05-28 DIAGNOSIS — I10 Essential (primary) hypertension: Secondary | ICD-10-CM | POA: Insufficient documentation

## 2011-05-28 DIAGNOSIS — E8809 Other disorders of plasma-protein metabolism, not elsewhere classified: Secondary | ICD-10-CM | POA: Insufficient documentation

## 2011-05-28 DIAGNOSIS — D72819 Decreased white blood cell count, unspecified: Secondary | ICD-10-CM | POA: Insufficient documentation

## 2011-05-28 DIAGNOSIS — R109 Unspecified abdominal pain: Secondary | ICD-10-CM | POA: Insufficient documentation

## 2011-05-28 DIAGNOSIS — IMO0001 Reserved for inherently not codable concepts without codable children: Secondary | ICD-10-CM | POA: Insufficient documentation

## 2011-05-28 DIAGNOSIS — Z79899 Other long term (current) drug therapy: Secondary | ICD-10-CM | POA: Insufficient documentation

## 2011-05-28 DIAGNOSIS — R5381 Other malaise: Secondary | ICD-10-CM | POA: Insufficient documentation

## 2011-05-28 DIAGNOSIS — I4891 Unspecified atrial fibrillation: Secondary | ICD-10-CM | POA: Insufficient documentation

## 2011-05-28 DIAGNOSIS — E785 Hyperlipidemia, unspecified: Secondary | ICD-10-CM | POA: Insufficient documentation

## 2011-05-28 DIAGNOSIS — I509 Heart failure, unspecified: Secondary | ICD-10-CM | POA: Insufficient documentation

## 2011-05-28 DIAGNOSIS — D649 Anemia, unspecified: Secondary | ICD-10-CM | POA: Insufficient documentation

## 2011-05-28 DIAGNOSIS — D696 Thrombocytopenia, unspecified: Secondary | ICD-10-CM | POA: Insufficient documentation

## 2011-05-28 DIAGNOSIS — R002 Palpitations: Secondary | ICD-10-CM | POA: Insufficient documentation

## 2011-05-28 DIAGNOSIS — M7989 Other specified soft tissue disorders: Secondary | ICD-10-CM | POA: Insufficient documentation

## 2011-05-28 DIAGNOSIS — D729 Disorder of white blood cells, unspecified: Secondary | ICD-10-CM

## 2011-05-28 LAB — CBC
HCT: 41.7 % (ref 39.0–52.0)
Hemoglobin: 13.4 g/dL (ref 13.0–17.0)
MCH: 28.3 pg (ref 26.0–34.0)
MCHC: 32.1 g/dL (ref 30.0–36.0)
RDW: 15.5 % (ref 11.5–15.5)

## 2011-05-28 MED ORDER — FENTANYL CITRATE 0.05 MG/ML IJ SOLN
INTRAMUSCULAR | Status: AC
  Filled 2011-05-28: qty 4

## 2011-05-28 MED ORDER — FENTANYL CITRATE 0.05 MG/ML IJ SOLN
INTRAMUSCULAR | Status: AC | PRN
  Administered 2011-05-28: 100 ug via INTRAVENOUS

## 2011-05-28 MED ORDER — MIDAZOLAM HCL 2 MG/2ML IJ SOLN
INTRAMUSCULAR | Status: AC
  Filled 2011-05-28: qty 2

## 2011-05-28 MED ORDER — MIDAZOLAM HCL 5 MG/5ML IJ SOLN
INTRAMUSCULAR | Status: AC | PRN
  Administered 2011-05-28: 2 mg via INTRAVENOUS

## 2011-05-28 MED ORDER — SODIUM CHLORIDE 0.9 % IV SOLN
Freq: Once | INTRAVENOUS | Status: AC
  Administered 2011-05-28: 500 mL via INTRAVENOUS

## 2011-05-28 NOTE — Procedures (Signed)
CT guided bone marrow aspirate and biopsy.  No immediate complications.  See dictated Radiology report.

## 2011-05-28 NOTE — H&P (Signed)
Don Brown is an 56 y.o. male.   Chief Complaint: possible multiple myeloma.  Presents today for bone marrow needle core biopsy.  HPI: pancytopenia, anemia, seen by oncologist. ? MGUS.   Past Medical History  Diagnosis Date  . 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 Atrial fibrillation   . CHF (congestive heart failure)     Chronic systolic heart failure with an ejection fraction of 40%- 45%  . Morbid obesity   . Chronic pain   . Narcotic dependence, episodic use     chronic narcotic use  . Respiratory failure     History of ventilatory-dependent respiratory failure and tracheostomy   . History of renal failure     requiring hemodialysis in the past with normal renal function at this point  . History of ventral hernia repair   . Cervical compression fracture     Cervical disk disease requiring surgery  . Hypertension   . Hyperlipidemia   . Degenerative joint disease     Past Surgical History  Procedure Date  . Ventral hernia repair   . Cholecystectomy   . Tracheostomy   . Pancreas surgery   . Pancreatic stents     Family History  Problem Relation Age of Onset  . Hypertension Mother   . Diabetes Mother   . Lymphoma Father    Social History:  has an unknown smoking status. He does not have any smokeless tobacco history on file. His alcohol and drug histories not on file.  Allergies:  Allergies  Allergen Reactions  . Morphine And Related Nausea And Vomiting    Medications Prior to Admission  Medication Sig Dispense Refill  . Amylase-Lipase-Protease (PANCREASE PO) Take 2 capsules by mouth 3 (three) times daily with meals.       Marland Kitchen aspirin 81 MG tablet Take 81 mg by mouth daily before breakfast.       . Cyanocobalamin (VITAMIN B 12 PO) Take 2 tablets by mouth daily.       . digoxin (LANOXIN) 0.125 MG tablet Take 125 mcg by mouth  every morning.       . Flaxseed, Linseed, (FLAX SEEDS PO) Take 2 capsules by mouth daily.       Chilton Si Tea, Camillia sinensis, (GREEN TEA PO) Take 2 tablets by mouth daily.       Marland Kitchen HYDROcodone-acetaminophen (NORCO) 5-325 MG per tablet Take 1 tablet by mouth every 6 (six) hours as needed. Pain       . lisinopril (PRINIVIL,ZESTRIL) 10 MG tablet Take 10 mg by mouth daily.       . metoprolol (TOPROL-XL) 50 MG 24 hr tablet Take 50 mg by mouth every morning.       . OXYCODONE HCL PO Take 10 mg by mouth every 12 (twelve) hours as needed. Pain      . furosemide (LASIX) 40 MG tablet Take 40 mg by mouth daily as needed. Fluid      . oxyCODONE (OXYCONTIN) 20 MG 12 hr tablet Take 20 mg by mouth every 12 (twelve) hours.        . traMADol (ULTRAM) 50 MG tablet Take 50 mg by mouth every 6 (six) hours as needed. Maximum dose= 8 tablets per day. Pain        Medications Prior to Admission  Medication Dose Route Frequency Provider Last Rate Last Dose  . 0.9 %  sodium chloride infusion   Intravenous Once Robet Leu, PA         Review of Systems  Constitutional: Positive for malaise/fatigue. Negative for fever, chills and weight loss.  Cardiovascular: Positive for palpitations and leg swelling. Negative for chest pain.  Gastrointestinal: Positive for abdominal pain. Negative for nausea and vomiting.  Musculoskeletal: Positive for myalgias.  Neurological: Positive for weakness. Negative for seizures.    Blood pressure 158/93, pulse 73, temperature 98.4 F (36.9 C), temperature source Oral, resp. rate 18, height 6' (1.829 m), SpO2 96.00%. Physical Exam  Constitutional: He is oriented to person, place, and time. He appears well-developed and well-nourished. No distress.  HENT:  Head: Normocephalic and atraumatic.  Cardiovascular: Exam reveals gallop. Exam reveals no friction rub.   Murmur heard.      Ir, ir   Respiratory: Effort normal and breath sounds normal. No respiratory distress. He has no  wheezes.  GI: Bowel sounds are normal. There is tenderness.  Neurological: He is alert and oriented to person, place, and time.  Skin: Skin is warm and dry.  Psychiatric: His behavior is normal. Judgment normal.     Assessment/Plan Patient presents today for bone marrow needle core biopsy.  Procedure details, risks and benefits discussed with the patient's apparent understanding.  Written consent obtained.  Plan to proceed with biopsy once labs reviewed.   Daemion Mcniel D 05/28/2011, 8:40 AM

## 2011-05-28 NOTE — Discharge Instructions (Signed)
Biopsy A biopsy is a procedure in which small samples of tissue are removed from the body. The tissue is examined under a microscope. A biopsy may be done to determine the cause (diagnosis) of a condition or mass (tumor). A biopsy may also be done to determine the best treatment for you. In some instances, a biopsy may be performed on normal tissue to determine if cancer has spread or if a transplanted organ is being rejected. There are 2 ways to obtain samples:  Fine needle biopsy. Samples are removed using a thin needle inserted through the skin.   Open biopsy. Samples are removed after a cut (incision) is made through the skin.  LET YOUR CAREGIVER KNOW ABOUT:  Allergies to food or medicine.   Medicines taken, including vitamins, herbs, eyedrops, over-the-counter medicines, and creams.   Use of steroids (by mouth or creams).   Previous problems with anesthetics or numbing medicines.   History of bleeding problems or blood clots.   Previous surgery.   Other health problems, including diabetes and kidney problems.   Possibility of pregnancy, if this applies.  RISKS AND COMPLICATIONS  Bleeding from the biopsy site. The risk of bleeding is higher if you have a bleeding disorder or are taking any blood thinning medicines (anticoagulants).   Infection.   Injury to organs or structures near the biopsy site.   Chronic pain at the biopsy site. This is defined as pain that lasts for more than 3 months.   Very rarely, a second biopsy may be required if not enough tissue was collected during the first biopsy.  BEFORE THE PROCEDURE Ask your caregiver what time you need to arrive for your procedure. Ask your caregiver whether you need to stop eating or drinking (fast) before your procedure. Ask your caregiver about changing or stopping your regular medicines. A blood sample may be done to determine your blood clotting time. Medicine may be given to help you relax (sedative). PROCEDURE During  a fine needle biopsy, you will be awake during the procedure. You will be positioned to allow the best possible access to the biopsy site. Let your caregiver know if the position is not comfortable. The biopsy site will be cleaned. A needle is inserted through your skin. You may feel mild discomfort during this procedure. The needle is withdrawn once tissue samples have been removed. Pressure may be applied to the biopsy site to reduce swelling and to ensure that bleeding has stopped. The samples will be sent to be examined. During an open biopsy, you may be given medicine that numbs the area (local anesthetic) or medicine that makes you sleep (general anesthetic). An incision is made through the skin. A tissue sample or the entire mass is removed. The sample or mass will be sent to be examined. Sometimes, the sample or mass may be examined during the procedure. If the sample or mass contains cancer cells, further tissue or structures may be removed. The incision is then closed with stitches (sutures) or skin glue (adhesive). AFTER THE PROCEDURE Your recovery will be assessed and monitored. If there are no problems, you should be able to go home shortly after the procedure (outpatient). You will need to arrange for someone to drive you home if you received a sedative or pain relieving medicine during the procedure. Ask when your test results will be ready. Make sure you get your test results. Document Released: 03/06/2000 Document Revised: 02/26/2011 Document Reviewed: 09/04/2010 [employer] Patient Information 2012 [employer]. 

## 2011-06-02 ENCOUNTER — Telehealth: Payer: Self-pay | Admitting: *Deleted

## 2011-06-02 NOTE — Telephone Encounter (Signed)
Patient called to request a copy of x-ray done in January and his BM BX report. Okay to mail per dr Clelia Croft. Mailed.

## 2011-06-05 ENCOUNTER — Encounter: Payer: Self-pay | Admitting: Oncology

## 2011-06-11 ENCOUNTER — Other Ambulatory Visit: Payer: Self-pay | Admitting: Oncology

## 2011-06-11 ENCOUNTER — Telehealth: Payer: Self-pay | Admitting: Oncology

## 2011-06-11 IMAGING — CT CT ABD-PELV W/ CM
2 of 5 series · 16 of 46 positions shown, 18 images · IV contrast (agent unspecified)
Comparison: CT scan 02/12/2010.

CLINICAL DATA: Acute pancreatitis.  Evaluate pseudocyst.

CT ABDOMEN AND PELVIS WITH CONTRAST
TECHNIQUE: Multidetector CT imaging of the abdomen and pelvis was
performed following the standard protocol during bolus
administration of intravenous contrast.
Contrast: 125 ml [id number]-1CC.

[Series 2: abd_pel 5.0 b40f st · axial · 0.88mm/px · z∈[-482,-2]mm · 13 of 108 slices shown, 15 images]
[im 6/108  soft-tissue]
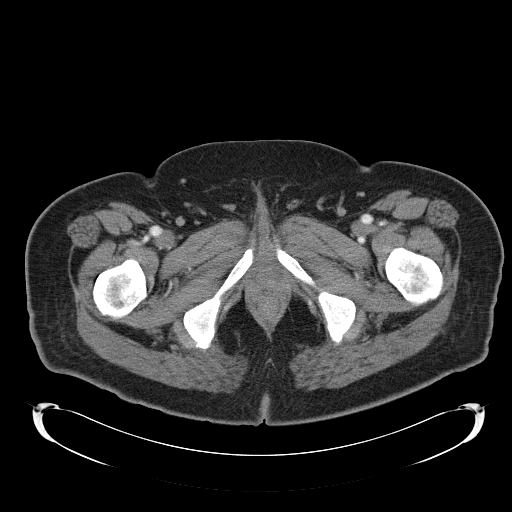
[im 6/108  bone]
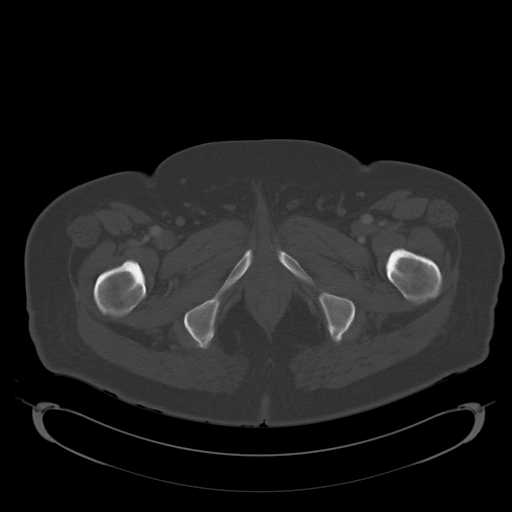
[im 17/108  soft-tissue]
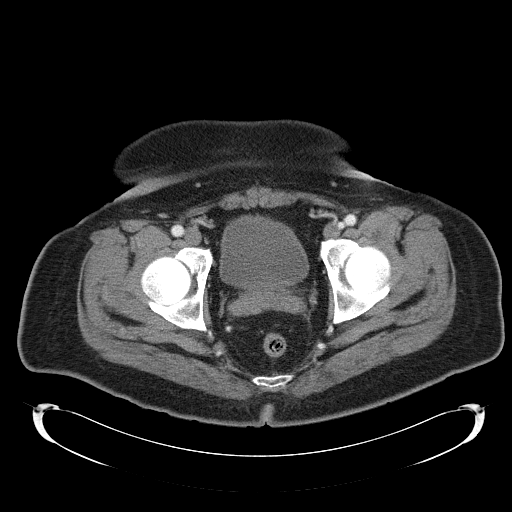
[im 23/108  soft-tissue]
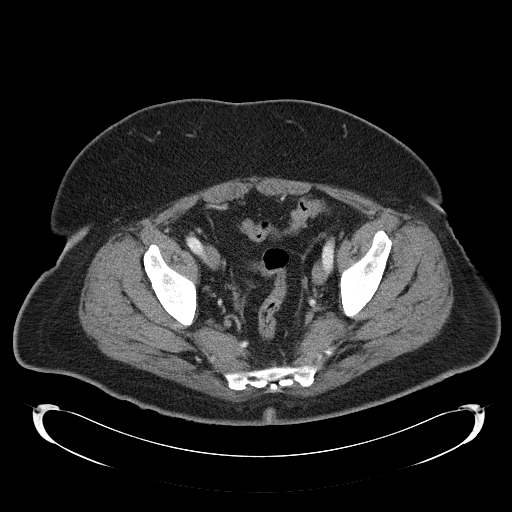
[im 29/108  soft-tissue]
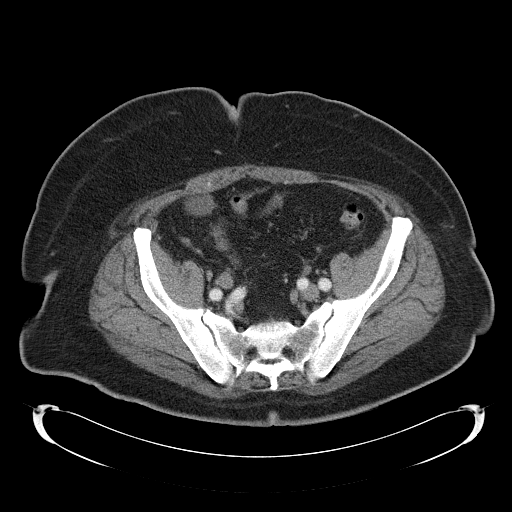
[im 40/108  soft-tissue]
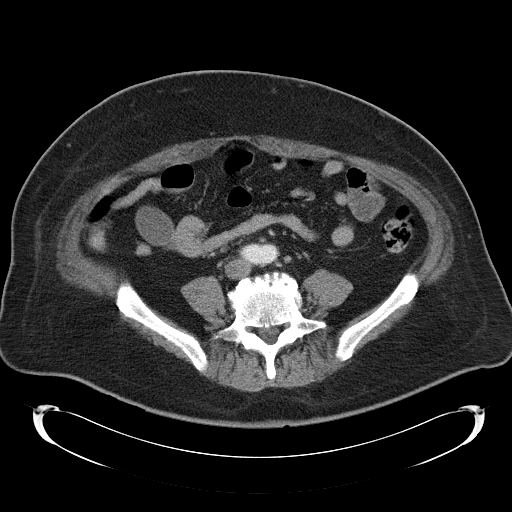
[im 46/108  soft-tissue]
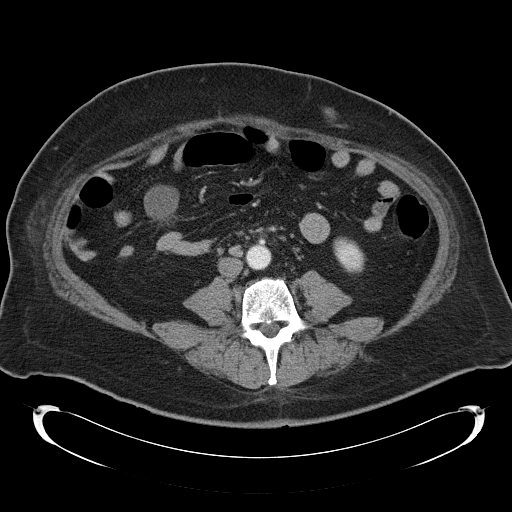
[im 57/108  soft-tissue]
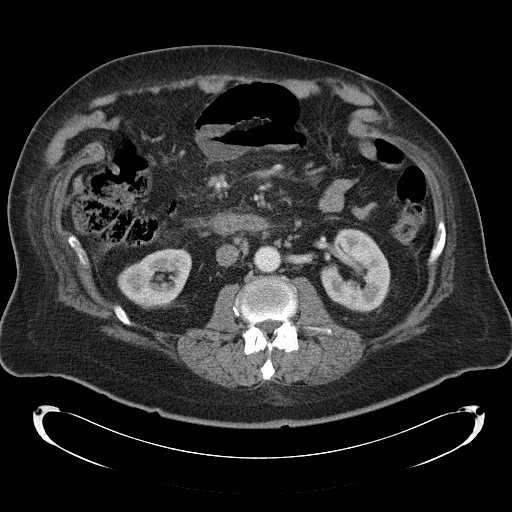
[im 62/108  soft-tissue]
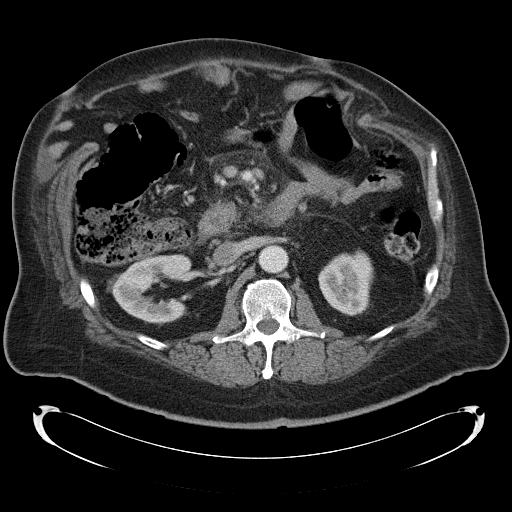
[im 68/108  soft-tissue]
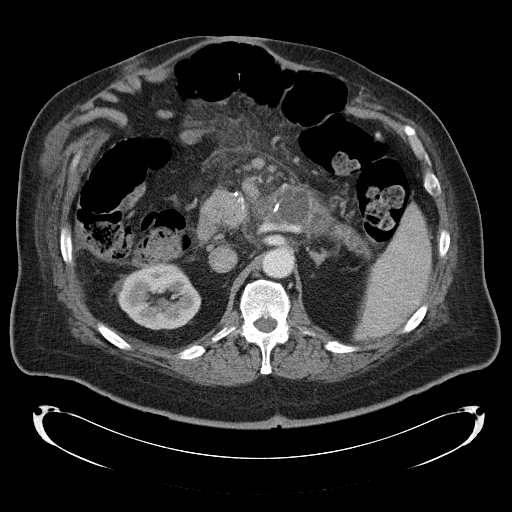
[im 68/108  bone]
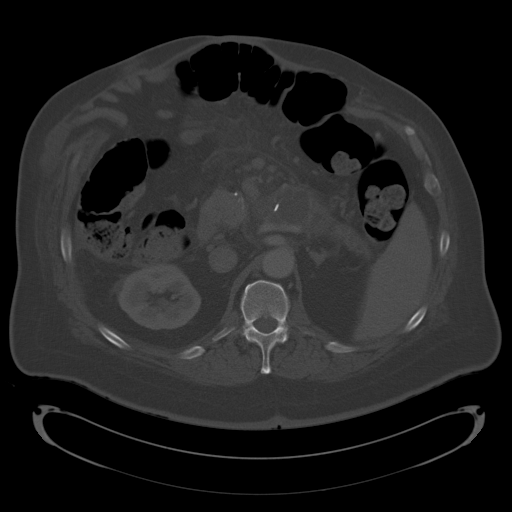
[im 79/108  soft-tissue]
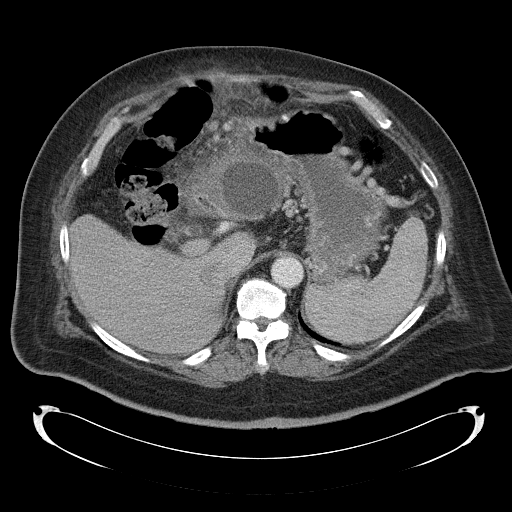
[im 85/108  soft-tissue]
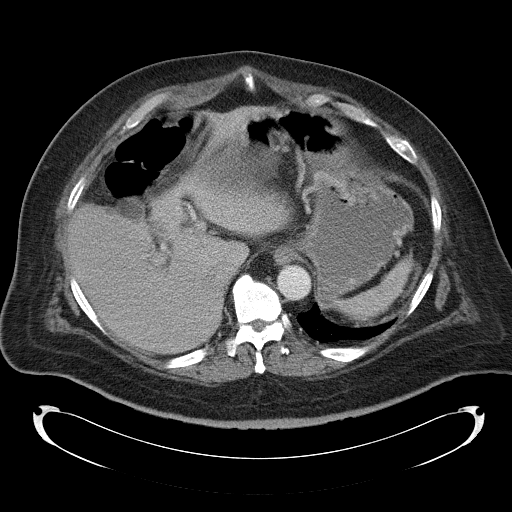
[im 91/108  soft-tissue]
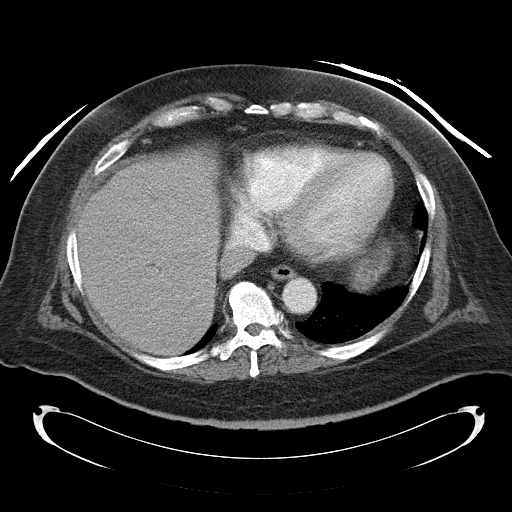
[im 102/108  soft-tissue]
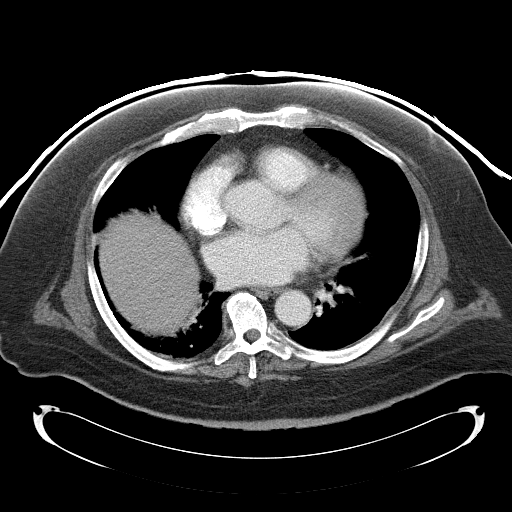

[Series 602: coronal abdomen · coronal · 1.09mm/px · 3 of 162 slices shown]
[im 54/162  soft-tissue]
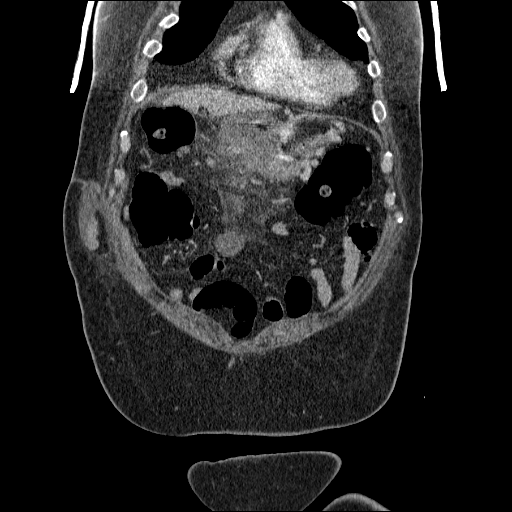
[im 72/162  soft-tissue]
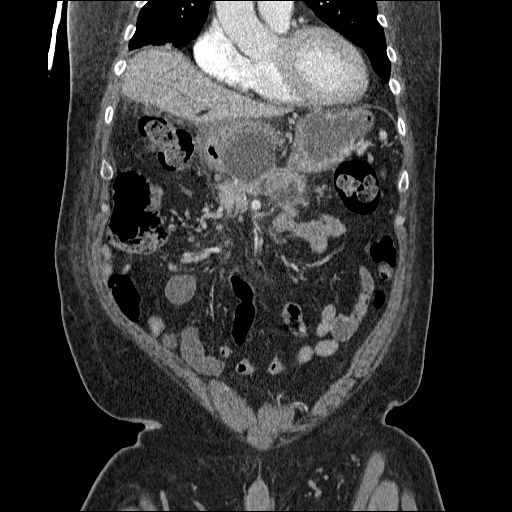
[im 90/162  soft-tissue]
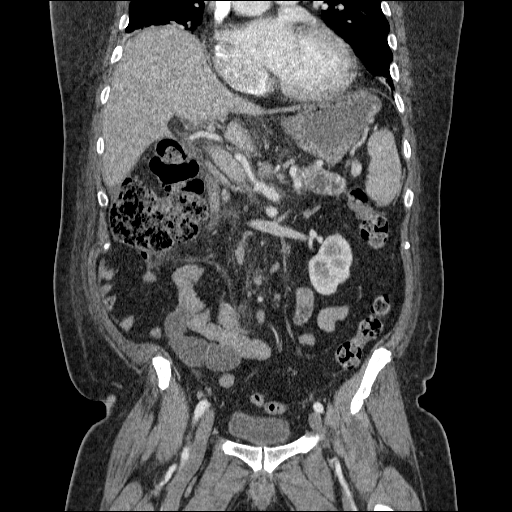

[16 of 46 positions shown; findings below may reference images not displayed]

FINDINGS: The lung bases are clear except for streaky subsegmental
atelectasis.  The heart is enlarged but stable.

Stable hepatic cysts.  No biliary dilatation.  The portal vein is
patent.  Stable occlusion of the splenic vein with extensive
perigastric collaterals.  There are chronic changes of pancreatitis
with what is likely a combination of a markedly dilated tortuous
main pancreatic duct in the body and tail region and possible small
pseudocysts.  There is a 5.9 x 4.9 cm pancreatic pseudocyst
projecting off the midbody region superiorly into the lesser sac.
This has increased significantly since the prior examination would
measured 2.8 x 2.0 cm.  No active inflammatory change involving the
pancreas.  Normal enhancement without findings for necrosis.  There
are enlarged peripancreatic and celiac axis lymph nodes.  Probable
chronic inflammation in the mesentery.

The spleen is normal in size.  No focal lesions.  Adrenal glands
and kidneys are unremarkable and stable.

The aorta is normal in caliber.  No dissection.  The major branch
vessels are normal.  No retroperitoneal adenopathy.

The stomach is stable in appearance.  The duodenum, small bowel and
colon are unremarkable and unchanged.  There are a few air filled
small bowel loops in the central abdomen but no findings for
obstruction.

The bladder, prostate gland and seminal vesicles are unremarkable
and stable.  Stable soft tissue nodule in the left perirectal
region is likely a lymph node.

The bony structures are unremarkable and stable.
IMPRESSION: 1.  Enlarging pancreatic pseudocyst projecting up into the lesser
sac.  This measures 5.9 x 4.9 cm.
2.  Chronic changes of remote pancreatitis with a markedly dilated
tortuous main pancreatic duct in the tail region and the or
pseudocysts.
3.  Persistent peripancreatic lymphadenopathy.

4.  A stable hepatic cysts.
5.  Chronic splenic vein obstruction with perigastric collaterals.

## 2011-06-11 NOTE — Telephone Encounter (Signed)
S/w pt re appt for 4/9 @ 12 pm.

## 2011-06-30 ENCOUNTER — Telehealth: Payer: Self-pay | Admitting: Oncology

## 2011-06-30 ENCOUNTER — Ambulatory Visit (HOSPITAL_BASED_OUTPATIENT_CLINIC_OR_DEPARTMENT_OTHER): Payer: Non-veteran care | Admitting: Oncology

## 2011-06-30 ENCOUNTER — Ambulatory Visit (HOSPITAL_BASED_OUTPATIENT_CLINIC_OR_DEPARTMENT_OTHER): Payer: Non-veteran care | Admitting: Lab

## 2011-06-30 VITALS — BP 143/87 | HR 66 | Temp 97.2°F | Ht 72.0 in | Wt 266.2 lb

## 2011-06-30 DIAGNOSIS — D509 Iron deficiency anemia, unspecified: Secondary | ICD-10-CM

## 2011-06-30 DIAGNOSIS — D729 Disorder of white blood cells, unspecified: Secondary | ICD-10-CM

## 2011-06-30 DIAGNOSIS — E8809 Other disorders of plasma-protein metabolism, not elsewhere classified: Secondary | ICD-10-CM

## 2011-06-30 DIAGNOSIS — D7289 Other specified disorders of white blood cells: Secondary | ICD-10-CM

## 2011-06-30 LAB — CBC WITH DIFFERENTIAL/PLATELET
BASO%: 0.7 % (ref 0.0–2.0)
Basophils Absolute: 0 10*3/uL (ref 0.0–0.1)
EOS%: 4.6 % (ref 0.0–7.0)
HCT: 43.6 % (ref 38.4–49.9)
HGB: 13.8 g/dL (ref 13.0–17.1)
LYMPH%: 25.5 % (ref 14.0–49.0)
MCH: 27.9 pg (ref 27.2–33.4)
MCHC: 31.6 g/dL — ABNORMAL LOW (ref 32.0–36.0)
MCV: 88.1 fL (ref 79.3–98.0)
MONO%: 8.8 % (ref 0.0–14.0)
NEUT%: 60.4 % (ref 39.0–75.0)
Platelets: 136 10*3/uL — ABNORMAL LOW (ref 140–400)
lymph#: 0.9 10*3/uL (ref 0.9–3.3)

## 2011-06-30 NOTE — Progress Notes (Signed)
Hematology and Oncology Follow Up Visit  Don Brown [id number] 30-May-1955 56 y.o. 06/30/2011 12:14 PM  CC: Don Najjar, MD    Principle Diagnosis: 56 year old with :  1. Iron-deficiency anemia. resolved 2. Mils leukopenia, and thrombocytopenia. 3. 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    Interim History: Don Brown presents today for a followup visit.  He is a gentleman that I saw for the first time on 08/28/2010 for evaluation for pancytopenia.  At that time, his white cell count was 3000 and he had a normal differential.  His hemoglobin was 10.9 and platelet count 139.  He had a workup for his anemia that included iron deficiency workup, which did confirm the presence of an iron-deficiency anemia at that time.  His ferritin was 12, iron level was 17, saturation of 4%.  At that time, the patient was instructed to start iron supplements at 325 mg b.i.d.    He did have an SPEP done on 12/13 at the [facility] which showed a spike up to 2.5 g/dl. His Don Brown showed 5% plasma cells. Patient asymptomatic.   Medications: I have reviewed the patient's current medications. Current outpatient prescriptions:Amylase-Lipase-Protease (PANCREASE PO), Take 2 capsules by mouth 3 (three) times daily with meals. , Disp: , Rfl: ;  aspirin 81 MG tablet, Take 81 mg by mouth daily before breakfast. , Disp: , Rfl: ;  Cyanocobalamin (VITAMIN B 12 PO), Take 2 tablets by mouth daily. , Disp: , Rfl: ;  digoxin (LANOXIN) 0.125 MG tablet, Take 125 mcg by mouth every morning. , Disp: , Rfl:  Flaxseed, Linseed, (FLAX SEEDS PO), Take 2 capsules by mouth daily. , Disp: , Rfl: ;  furosemide (LASIX) 40 MG tablet, Take 40 mg by mouth daily as needed. Fluid, Disp: , Rfl: ;  Green Tea, Camillia sinensis, (GREEN TEA PO), Take 2 tablets by mouth daily. , Disp: , Rfl: ;  HYDROcodone-acetaminophen (NORCO) 5-325 MG per tablet, Take 1 tablet by mouth every 6 (six) hours as needed. Pain , Disp: , Rfl:  lisinopril  (PRINIVIL,ZESTRIL) 10 MG tablet, Take 10 mg by mouth daily. , Disp: , Rfl: ;  metoprolol (TOPROL-XL) 50 MG 24 hr tablet, Take 50 mg by mouth every morning. , Disp: , Rfl: ;  oxyCODONE (OXYCONTIN) 20 MG 12 hr tablet, Take 20 mg by mouth every 12 (twelve) hours.  , Disp: , Rfl: ;  OXYCODONE HCL PO, Take 10 mg by mouth every 12 (twelve) hours as needed. Pain, Disp: , Rfl:  traMADol (ULTRAM) 50 MG tablet, Take 50 mg by mouth every 6 (six) hours as needed. Maximum dose= 8 tablets per day. Pain , Disp: , Rfl:   Allergies:  Allergies  Allergen Reactions  . Morphine And Related Nausea And Vomiting    Past Medical History, Surgical history, Social history, and Family History were reviewed and updated.  Review of Systems: Constitutional:  Negative for fever, chills, night sweats, anorexia, weight loss, pain. Cardiovascular: no chest pain or dyspnea on exertion Respiratory: no cough, shortness of breath, or wheezing Neurological: no TIA or stroke symptoms Dermatological: negative ENT: negative Skin: Negative. Gastrointestinal: no abdominal pain, change in bowel habits, or black or bloody stools Genito-Urinary: no dysuria, trouble voiding, or hematuria Hematological and Lymphatic: negative Breast: negative Musculoskeletal: negative Remaining ROS negative. Physical Exam: Blood pressure 143/87, pulse 66, temperature 97.2 F (36.2 C), temperature source Oral, height 6' (1.829 m), weight 266 lb 3.2 oz (120.748 kg). ECOG: 1   Lab Results:  Lab Results  Component Value Date   WBC 3.5* 05/28/2011   HGB 13.4 05/28/2011   HCT 41.7 05/28/2011   MCV 88.2 05/28/2011   PLT 148* 05/28/2011     Chemistry      Component Value Date/Time   NA 138 04/17/2011 0839   K 4.2 04/17/2011 0839   CL 111 04/17/2011 0839   CO2 20 04/17/2011 0839   BUN 27* 04/17/2011 0839   CREATININE 1.16 04/17/2011 0839      Component Value Date/Time   CALCIUM 8.7 04/17/2011 0839   ALKPHOS 56 04/17/2011 0839   AST 18 04/17/2011 0839   ALT  15 04/17/2011 0839   BILITOT 0.6 04/17/2011 0839       Impression and Plan:   This is a 56 year old gentleman with the following issues:  1. Microcytic anemia related to iron deficiency at this point.  Given the fact that he had responded very well to oral iron, his hemoglobin completely normalized.  At this point, I have recommended to Don Brown to continue on maintenance iron sulfate 325 mg daily  2. Leukocytopenia and Thrombocytopenia has resolved at this point and is close to normal.  The reason why it was probably low last time was probably related to his iron deficiency. 3.     Positive SPEP: a plasma disorder work up showed 5% plasma cells and no clear cut end organ damage. I would like to watch him closely and repeat SPEP today and repeat imaging studies and make sure there are no convincing evidence of end organ damage. The skeletal survey did suggest ill defined lesions in the right femur and left humerus. These changes are very subtle for me to call multiple meyloma and more likely MGUS.   I had a lengthy discussion with Don Brown today discussing the above findings. I discussed with him the natural course of plasma cell disorder  in general and more specifically MGUS and possible myeloma. He did not accept these findings and he felt that that is not enough being done at this time when I suggested repeating the x-rays and the Don survey specifically he refused. He was adamant to have blood work done on a more frequent basis than every 3 months he was agreeable to have his blood work done today and he was agreeable to come back and see me in one month's time. Overall he he was very frustrated with his overall care and I have urged him to seek another opinion specifically from the [facility] oncology clinic. I have given him copies of all his blood work as well as the results of the Don marrow biopsy. I have tried to address all his concerns and questions despite my best effort [name]. continued to be dissatisfied with his overall care.  At this time,  I feel uncomfortable calling his conditioning full-blown multiple myeloma given the subtle finding in his Don survey and the fact that he is really no other end organ damage with very limited plasma cell involvement in the Don  Marrow. I feel close observation and repeat imaging studies closely and initiate myeloma treatment if we see changes both of the blood work on his SPEP or on imaging studies.  DonBrown continued to refuse any of these recommendations yet he wants to be "treated for this condition".  I'm afraid that I cannot satisfy Don Brown appropriately and initiate treatments for multiple myeloma with the above findings. I continue to urge him to get a second  opinion at this time.  At times Don Brown was fairly confrontational and at times I felt uncomfortable doing an examination on  Don Brown today.   For the time being I told him that I will do everything to assist in his care and I urged him to get another opinion somewhere else at this time.     [facility], MD 4/9/201312:14 PM

## 2011-06-30 NOTE — Telephone Encounter (Signed)
Gv pt appt for [id number] 

## 2011-07-01 ENCOUNTER — Encounter: Payer: Self-pay | Admitting: *Deleted

## 2011-07-03 LAB — COMPREHENSIVE METABOLIC PANEL
Albumin: 4 g/dL (ref 3.5–5.2)
Alkaline Phosphatase: 56 U/L (ref 39–117)
BUN: 14 mg/dL (ref 6–23)
Creatinine, Ser: 1.07 mg/dL (ref 0.50–1.35)
Glucose, Bld: 103 mg/dL — ABNORMAL HIGH (ref 70–99)
Potassium: 4.2 mEq/L (ref 3.5–5.3)

## 2011-07-03 LAB — SPEP & IFE WITH QIG
Albumin ELP: 43.8 % — ABNORMAL LOW (ref 55.8–66.1)
Beta 2: 3.2 % (ref 3.2–6.5)
Gamma Globulin: 33.7 % — ABNORMAL HIGH (ref 11.1–18.8)
IgA: 124 mg/dL (ref 68–379)
IgM, Serum: 54 mg/dL (ref 41–251)

## 2011-07-03 LAB — KAPPA/LAMBDA LIGHT CHAINS
Kappa free light chain: 26.7 mg/dL — ABNORMAL HIGH (ref 0.33–1.94)
Kappa:Lambda Ratio: 18.41 — ABNORMAL HIGH (ref 0.26–1.65)

## 2011-07-10 ENCOUNTER — Encounter: Payer: Self-pay | Admitting: *Deleted

## 2011-07-14 ENCOUNTER — Encounter: Payer: Self-pay | Admitting: Oncology

## 2011-07-30 ENCOUNTER — Ambulatory Visit (HOSPITAL_BASED_OUTPATIENT_CLINIC_OR_DEPARTMENT_OTHER): Payer: Non-veteran care | Admitting: Oncology

## 2011-07-30 ENCOUNTER — Telehealth: Payer: Self-pay | Admitting: Oncology

## 2011-07-30 ENCOUNTER — Other Ambulatory Visit (HOSPITAL_BASED_OUTPATIENT_CLINIC_OR_DEPARTMENT_OTHER): Payer: Non-veteran care | Admitting: Lab

## 2011-07-30 VITALS — BP 141/83 | HR 63 | Temp 97.8°F | Ht 72.0 in | Wt 270.5 lb

## 2011-07-30 DIAGNOSIS — D729 Disorder of white blood cells, unspecified: Secondary | ICD-10-CM

## 2011-07-30 DIAGNOSIS — D509 Iron deficiency anemia, unspecified: Secondary | ICD-10-CM

## 2011-07-30 DIAGNOSIS — D7289 Other specified disorders of white blood cells: Secondary | ICD-10-CM

## 2011-07-30 DIAGNOSIS — D472 Monoclonal gammopathy: Secondary | ICD-10-CM

## 2011-07-30 LAB — CBC WITH DIFFERENTIAL/PLATELET
BASO%: 1.1 % (ref 0.0–2.0)
EOS%: 3.6 % (ref 0.0–7.0)
HCT: 44.5 % (ref 38.4–49.9)
LYMPH%: 29.8 % (ref 14.0–49.0)
MCH: 27.8 pg (ref 27.2–33.4)
MCHC: 31.8 g/dL — ABNORMAL LOW (ref 32.0–36.0)
NEUT%: 59.1 % (ref 39.0–75.0)
Platelets: 138 10*3/uL — ABNORMAL LOW (ref 140–400)

## 2011-07-30 LAB — COMPREHENSIVE METABOLIC PANEL
AST: 13 U/L (ref 0–37)
Alkaline Phosphatase: 53 U/L (ref 39–117)
BUN: 14 mg/dL (ref 6–23)
Creatinine, Ser: 1.07 mg/dL (ref 0.50–1.35)
Total Bilirubin: 0.7 mg/dL (ref 0.3–1.2)

## 2011-07-30 NOTE — Progress Notes (Signed)
Hematology and Oncology Follow Up Visit  Don Brown [id number] [birth year] 56 y.o. 07/30/2011 10:27 AM  CC: Don Najjar, MD    Principle Diagnosis: 56 year old with :  1. Iron-deficiency anemia. resolved 2. Mils leukopenia, and thrombocytopenia. 3. 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 IgG level of 4200. M-Spike 2.39.  Prior work up: [name] showed 5% plasma cells, done on 05/28/2011. Bone survey in 03/2011 is negative (the femur lesion was not seen on Ct scan and likely benign)   Interim History: Don Brown presents today for a followup visit.  He is a gentleman that I saw for the first time on 08/28/2010 for evaluation for pancytopenia.  At that time, his white cell count was 3000 and he had a normal differential.  His hemoglobin was 10.9 and platelet count 139.  He had a workup for his anemia is related to Fe deficiency. He was able to replace Fe with po supplement andd his Hgb has normalized. 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  Patient asymptomatic from a plasma cell disorder. He does report abdominal pain and nausea which is base line for him. He reports no bone pain. No frequent infections or recent hospitalizations.   Medications: I have reviewed the patient's current medications. Current outpatient prescriptions:Amylase-Lipase-Protease (PANCREASE PO), Take 2 capsules by mouth 3 (three) times daily with meals. , Disp: , Rfl: ;  aspirin 81 MG tablet, Take 81 mg by mouth daily before breakfast. , Disp: , Rfl: ;  Cyanocobalamin (VITAMIN B 12 PO), Take 2 tablets by mouth daily. , Disp: , Rfl: ;  digoxin (LANOXIN) 0.125 MG tablet, Take 125 mcg by mouth every morning. , Disp: , Rfl:  Flaxseed, Linseed, (FLAX SEEDS PO), Take 2 capsules by mouth daily. , Disp: , Rfl: ;  furosemide (LASIX) 40 MG tablet, Take 40 mg by mouth daily as needed. Fluid, Disp: , Rfl: ;  Green Tea, Camillia sinensis, (GREEN TEA  PO), Take 2 tablets by mouth daily. , Disp: , Rfl: ;  HYDROcodone-acetaminophen (NORCO) 5-325 MG per tablet, Take 1 tablet by mouth every 6 (six) hours as needed. Pain , Disp: , Rfl:  lisinopril (PRINIVIL,ZESTRIL) 10 MG tablet, Take 10 mg by mouth daily. , Disp: , Rfl: ;  metoprolol (TOPROL-XL) 50 MG 24 hr tablet, Take 50 mg by mouth every morning. , Disp: , Rfl: ;  oxyCODONE (OXYCONTIN) 20 MG 12 hr tablet, Take 20 mg by mouth every 12 (twelve) hours.  , Disp: , Rfl: ;  OXYCODONE HCL PO, Take 10 mg by mouth every 12 (twelve) hours as needed. Pain, Disp: , Rfl:  traMADol (ULTRAM) 50 MG tablet, Take 50 mg by mouth every 6 (six) hours as needed. Maximum dose= 8 tablets per day. Pain , Disp: , Rfl:   Allergies:  Allergies  Allergen Reactions  . Morphine And Related Nausea And Vomiting    Past Medical History, Surgical history, Social history, and Family History were reviewed and updated.  Review of Systems: Constitutional:  Negative for fever, chills, night sweats, anorexia, weight loss, pain. Cardiovascular: no chest pain or dyspnea on exertion Respiratory: no cough, shortness of breath, or wheezing Neurological: no TIA or stroke symptoms Dermatological: negative ENT: negative Skin: Negative. Gastrointestinal: no abdominal pain, change in bowel habits, or black or bloody stools Genito-Urinary: no dysuria, trouble voiding, or hematuria Hematological and Lymphatic: negative Breast: negative Musculoskeletal: negative Remaining ROS negative. Physical  Exam: Blood pressure 141/83, pulse 63, temperature 97.8 F (36.6 C), temperature source Oral, height 6' (1.829 m), weight 270 lb 8 oz (122.698 kg). ECOG: 1 General appearance: alert  Head: Normocephalic, without obvious abnormality, atraumatic  Neck: no adenopathy, no carotid bruit, no JVD, supple, symmetrical, trachea midline and thyroid not enlarged, symmetric, no tenderness/mass/nodules  Lymph nodes: Cervical, supraclavicular, and axillary  nodes normal.  Heart:regular rate and rhythm, S1, S2 normal, no murmur, click, rub or gallop  Lung:chest clear, no wheezing, rales, normal symmetric air entry  Abdomin: soft, non-tender, without masses or organomegaly  EXT:no erythema, induration, or nodules      Lab Results: Lab Results  Component Value Date   WBC 3.2* 07/30/2011   HGB 14.2 07/30/2011   HCT 44.5 07/30/2011   MCV 87.3 07/30/2011   PLT 138* 07/30/2011     Chemistry      Component Value Date/Time   NA 144 06/30/2011 1226   K 4.2 06/30/2011 1226   CL 101 06/30/2011 1226   CO2 34* 06/30/2011 1226   BUN 14 06/30/2011 1226   CREATININE 1.07 06/30/2011 1226      Component Value Date/Time   CALCIUM 9.1 06/30/2011 1226   ALKPHOS 56 06/30/2011 1226   AST 14 06/30/2011 1226   ALT 9 06/30/2011 1226   BILITOT 0.8 06/30/2011 1226       Impression and Plan:   This is a 56 year old gentleman with the following issues:  1. Microcytic anemia related to iron deficiency at this point.  Given the fact that he had responded very well to oral iron, his hemoglobin completely normalized.  At this point, I have recommended to Don Brown to continue on maintenance iron sulfate 325 mg daily  2. Leukocytopenia and Thrombocytopenia has resolved at this point and is close to normal.  The reason why it was probably low last time was probably related to his iron deficiency vs chronic benign changes. 3.     Positive SPEP: a plasma disorder work up showed 5% plasma cells and no clear cut end organ damage.These changes are very subtle for me to call multiple meyloma and more likely MGUS.   I will repeat SPEP in 6 weeks. I also need the [facility] permission  To get a bone survey repeated in the near future.    At this time,  I feel uncomfortable calling his conditioning full-blown multiple myeloma given the subtle finding in his bone survey and the fact that he is really no other end organ damage with very limited plasma cell involvement in the bone  Marrow. I feel close  observation and repeat imaging studies closely and initiate myeloma treatment if we see changes both of the blood work on his SPEP or on imaging studies.   I continued to educate Don Brown about this condition and written information was given to the patient.  Mr. Tsou was less confrontational during today's visit. But I feel he still unsatisfied with my assessment of his condition and continue to urge him to get a second opinion if he wishes.        [facility], MD 5/9/201310:27 AM

## 2011-07-30 NOTE — Telephone Encounter (Signed)
appts made and printed for pt aom °

## 2011-08-05 ENCOUNTER — Telehealth: Payer: Self-pay | Admitting: *Deleted

## 2011-08-05 NOTE — Telephone Encounter (Signed)
Mailed copy of labs done on 07/30/11 to patient's home. Per his request. Shaune Pollack

## 2011-08-26 ENCOUNTER — Telehealth: Payer: Self-pay | Admitting: *Deleted

## 2011-08-26 NOTE — Telephone Encounter (Signed)
Confirmed upcoming appointments with patient.  Also updated new address, as updated under demographics.

## 2011-09-03 ENCOUNTER — Other Ambulatory Visit: Payer: Non-veteran care

## 2011-09-03 ENCOUNTER — Ambulatory Visit: Payer: Non-veteran care | Admitting: Oncology

## 2011-09-10 ENCOUNTER — Telehealth: Payer: Self-pay

## 2011-09-10 NOTE — Telephone Encounter (Addendum)
PATIENT IS CALLING ABOUT HIS RECORDS BEING SENT TO THE [facility].  HE IS NOT HAPPY.  HE IS SAYING THAT IS BEING TOLD THAT HIS RECORDS PRACTICALLY DON'T EXIST.  HE APOLOGIZES FOR BEING AGGRAVATED, BUT HE IS ON A LOT OF MEDICINE AND HAS BEEN DIAGNOSED WITH A NEW AILMENT.  HE KNOWS HE SIGNED AUTHORIZATIONS BOTH HERE AND AT THE [facility] FOR THIS PAPERWORK.  HE THINKS HIS CHART IS LOST.  PLEASE HELP HIM

## 2011-09-16 NOTE — Telephone Encounter (Addendum)
Patient needs MR sent to [facility] ASAP. Message was put in but not routed, so this message has been waiting for 6 days. Thanks!

## 2011-09-16 NOTE — Telephone Encounter (Signed)
Patient was notified on June 20 that we needed a signed release so we will know where to send his records since this was unclear. Patient waited 6 days before coming in and signing a release. Will send records according to usual medical records policy.

## 2011-10-15 ENCOUNTER — Telehealth: Payer: Self-pay | Admitting: *Deleted

## 2011-10-15 ENCOUNTER — Ambulatory Visit: Payer: Non-veteran care | Admitting: Oncology

## 2011-10-15 ENCOUNTER — Other Ambulatory Visit: Payer: Non-veteran care

## 2011-10-15 ENCOUNTER — Telehealth: Payer: Self-pay | Admitting: Oncology

## 2011-10-15 NOTE — Telephone Encounter (Signed)
Patient called to cancel appointment today 10/15/2011.  POF sent to scheduling to re-schedule patient.

## 2011-10-15 NOTE — Telephone Encounter (Signed)
Pt called and r/s'd 7/25 appt to 9/3.

## 2011-11-20 ENCOUNTER — Telehealth: Payer: Self-pay | Admitting: Oncology

## 2011-11-20 NOTE — Telephone Encounter (Signed)
Returned pt's call re cx his 9/3 appt. Per pt he is not coming in until this billing situation is straightened out. S/ w pt he is aware that I received his message and cx'd appt. Message to desk nurse and Darlena.

## 2011-11-24 ENCOUNTER — Encounter: Payer: Self-pay | Admitting: Oncology

## 2011-11-24 ENCOUNTER — Other Ambulatory Visit: Payer: Non-veteran care

## 2011-11-24 ENCOUNTER — Ambulatory Visit: Payer: Non-veteran care | Admitting: Oncology

## 2011-11-24 NOTE — Progress Notes (Signed)
Pt called upset about his bill dos 04/20/11.  Pt was told by Radiology that the scan would be covered by the [facility].  When he received the bill he called the [place] and they told him that the scan is not covered.  He mentioned that he received a letter from collections.  I emailed Roberts Gaudy to follow up on this.

## 2012-04-06 ENCOUNTER — Telehealth: Payer: Self-pay

## 2012-04-06 NOTE — Telephone Encounter (Signed)
Patient needs the name of his cardiologist to get his records and reports for [facility] purposes.   Call [phone]

## 2012-04-06 NOTE — Telephone Encounter (Signed)
Pt called stated he needs copy of reports from cardiologist. Please let pt know when he may  Pick up  [phone]

## 2012-04-07 NOTE — Telephone Encounter (Signed)
[phone] is the number for Dr Swaziland, he was the physician who he was seeing for Cardiology.

## 2012-06-06 ENCOUNTER — Telehealth: Payer: Self-pay | Admitting: Cardiology

## 2012-06-06 NOTE — Telephone Encounter (Signed)
New problem   Pt need form stating he had no heart murmur from Dr Swaziland. He had spoke to Dr Swaziland about this matter before. Pt would like to speak to nurse or doctor concerning this matter.

## 2012-06-06 NOTE — Telephone Encounter (Signed)
Returned call to patient no answer.LMTC. 

## 2012-06-07 ENCOUNTER — Telehealth: Payer: Self-pay | Admitting: Cardiology

## 2012-06-07 NOTE — Telephone Encounter (Signed)
Spoke to patient he stated he needed Dr.Jordan to write a letter stating based on the medical evidence that the patient provided that he did not have a heart murmur at the time he joined th [employer] in 1976.Stated he has his [employer] medical records for Dr.Jordan to review.Patient was told Dr.Jordan out of the office until Thursday 06/09/12, will check with him and call back.

## 2012-06-07 NOTE — Telephone Encounter (Signed)
See 06/07/12 note.

## 2012-06-07 NOTE — Telephone Encounter (Signed)
New problem    Pt was returning your phone call from yesterday

## 2012-06-10 NOTE — Telephone Encounter (Signed)
Patient called was told spoke to Dr.Jordan he will not be able to write a letter that says patient did not have a heart murmur at the time he joined the [employer].Patient requested appointment to see Dr.Jordan.Appointment scheduled 07/14/12.

## 2012-07-14 ENCOUNTER — Ambulatory Visit (INDEPENDENT_AMBULATORY_CARE_PROVIDER_SITE_OTHER): Payer: Non-veteran care | Admitting: Cardiology

## 2012-07-14 ENCOUNTER — Encounter: Payer: Self-pay | Admitting: Cardiology

## 2012-07-14 VITALS — Ht 72.0 in | Wt 317.4 lb

## 2012-07-14 DIAGNOSIS — I428 Other cardiomyopathies: Secondary | ICD-10-CM

## 2012-07-14 DIAGNOSIS — I4891 Unspecified atrial fibrillation: Secondary | ICD-10-CM

## 2012-07-14 DIAGNOSIS — I38 Endocarditis, valve unspecified: Secondary | ICD-10-CM

## 2012-07-14 DIAGNOSIS — I509 Heart failure, unspecified: Secondary | ICD-10-CM

## 2012-07-14 NOTE — Progress Notes (Signed)
Don Brown Date of Birth: [birth year] Medical Record #[id number]  History of Present Illness: Don Brown is seen today for consultation. I saw him on 01/14/2011 for a second opinion concerning his heart disease. The patient reports a history of rheumatic fever at age 57. 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 According to the records that he provides today there was no other cardiac evaluation done. 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 He was hospitalized and diagnosed with a cardiac problem and was granted a medical discharge. This was in 1976. He was diagnosed with congestive heart failure and ejection fraction was 35%. In some of his medical records that are provided he is referred to as having rheumatic heart disease. When we evaluated him in 2012 he had an echocardiogram in 2011 which showed an ejection fraction of 40-45% with mild mitral and aortic insufficiency. This was repeated in 2012 and showed similar findings. His aortic insufficiency at that time was described as mild to moderate. The aortic valve was trileaflet with normal thickness. Patient currently denies any chest pain or dizziness. He does have chronic symptoms of shortness of breath as well as intermittent edema.   Current Outpatient Prescriptions on File Prior to Visit  Medication Sig Dispense Refill  . Amylase-Lipase-Protease (PANCREASE PO) Take 2 capsules by mouth 3 (three) times daily with meals.       Marland Kitchen aspirin 81 MG tablet Take 81 mg by mouth daily before breakfast.       . Cyanocobalamin (VITAMIN B 12 PO) Take 2 tablets by mouth daily.       . digoxin (LANOXIN) 0.125 MG tablet Take 125 mcg by mouth every morning.       . furosemide (LASIX) 40 MG tablet Take 40 mg by mouth daily as needed. Fluid      . Green Tea, Camillia sinensis, (GREEN TEA PO) Take 2 tablets by mouth daily.       Marland Kitchen HYDROcodone-acetaminophen (NORCO)  5-325 MG per tablet Take 1 tablet by mouth every 6 (six) hours as needed. Pain       . lisinopril (PRINIVIL,ZESTRIL) 10 MG tablet Take 10 mg by mouth daily.       . metoprolol (TOPROL-XL) 50 MG 24 hr tablet Take 50 mg by mouth every morning.        No current facility-administered medications on file prior to visit.    Allergies  Allergen Reactions  . Morphine And Related Nausea And Vomiting    Past Medical History  Diagnosis Date  . 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 Atrial fibrillation   . CHF (congestive heart failure)     Chronic systolic heart failure with an ejection fraction of 40%- 45%  . Morbid obesity   . Chronic pain   . Narcotic dependence, episodic use     chronic narcotic use  . Respiratory failure     History of ventilatory-dependent respiratory failure and tracheostomy   . History of renal failure     requiring hemodialysis in the past with normal renal function at this point  . History of ventral hernia repair   . Cervical compression fracture     Cervical disk disease requiring surgery  . Hypertension   . Hyperlipidemia   .  Degenerative joint disease     Past Surgical History  Procedure Laterality Date  . Ventral hernia repair    . Cholecystectomy    . Tracheostomy    . Pancreas surgery    . Pancreatic stents      History  Smoking status  . Unknown If Ever Smoked  Smokeless tobacco  . Not on file    History  Alcohol Use     Family History  Problem Relation Age of Onset  . Hypertension Mother   . Diabetes Mother   . Lymphoma Father     Review of Systems:   Physical Exam: Ht 6' (1.829 m)  Wt 317 lb 6.4 oz (143.972 kg)  BMI 43.04 kg/m2 No other exam performed. LABORATORY DATA:   Assessment / Plan: I spent 30 minutes in consultation with Don Brown today. I reviewed his old medical records  that he brought with him today and his most recent Echo findings. We also discussed documentation he was seeking for his disability determination with the [facility]. It is my opinion that the patient has a nonischemic cardiomyopathy of unknown cause. I do not feel that he has rheumatic heart disease. I think that his aortic and mitral insufficiency are related to his LV dilatation. There is no significant evidence of valve thickening to suggest a rheumatic component. It is impossible to tell at this point whether his cardiomyopathy preceded his collapse in 1976 but there is no documentation on his enlistment physical of this condition. It was clearly diagnosed at the time of his evaluation for his collapse in 1976. It is likely that his extreme physical stress at the time of his military service contributed to the decompensation and/or manifestation of his cardiac disease. His cardiac dysfunction has persisted to the present time.

## 2012-07-25 ENCOUNTER — Telehealth: Payer: Self-pay | Admitting: Cardiology

## 2012-07-25 NOTE — Telephone Encounter (Signed)
Returned call to patient he stated he gave Dr.Jordan some paper work at his last office visit.Stated he wanted to know if it was completed.Dr.Jordan out of office this week will check with him next week and call him back.

## 2012-07-25 NOTE — Telephone Encounter (Signed)
New Prob      Pt requested a form to be signed by Dr. Swaziland regarding a questionnaire. Would like to speak to nurse.

## 2012-08-03 NOTE — Telephone Encounter (Signed)
Returned call to patient no answer.Left message on personal voice mail copy of 07/14/12 office note mailed to your home.

## 2012-08-08 ENCOUNTER — Telehealth: Payer: Self-pay | Admitting: Cardiology

## 2012-08-08 NOTE — Telephone Encounter (Signed)
Returned call to patient 07/14/12 office note mailed 08/03/12.Patient stated he has not received yet.Copy of 07/14/12 office note re mailed.

## 2012-08-08 NOTE — Telephone Encounter (Signed)
Follow-up:    Patient called in because he has not received his mail correspondence as of yet.  Please call back.

## 2012-08-18 ENCOUNTER — Telehealth: Payer: Self-pay | Admitting: Cardiology

## 2012-08-18 NOTE — Telephone Encounter (Signed)
New problem  Pt states he needs to speak with Dr Swaziland. I asked pt what it was concerning but he said to just have Dr Swaziland call him.

## 2012-08-18 NOTE — Telephone Encounter (Signed)
Returned call to patient he stated he received his last office note and he wanted to know what chronic narcotic use means.Patient was explained he takes hydrocodone as needed for pain.

## 2012-12-29 ENCOUNTER — Encounter (INDEPENDENT_AMBULATORY_CARE_PROVIDER_SITE_OTHER): Payer: Self-pay | Admitting: General Surgery

## 2012-12-29 ENCOUNTER — Ambulatory Visit (INDEPENDENT_AMBULATORY_CARE_PROVIDER_SITE_OTHER): Payer: Non-veteran care | Admitting: General Surgery

## 2012-12-29 VITALS — BP 140/90 | HR 84 | Resp 24 | Ht 72.0 in | Wt 331.4 lb

## 2012-12-29 DIAGNOSIS — K430 Incisional hernia with obstruction, without gangrene: Secondary | ICD-10-CM

## 2012-12-29 NOTE — Progress Notes (Addendum)
Patient ID: Don Brown, male   DOB: 04-03-1955, 57 y.o.   MRN: [id number]  Chief Complaint  Patient presents with  . Hernia    HPI Don Brown is a 57 y.o. male.  He is referred by Dr. Homero Fellers  at the [facility] in [place] for an opinion regarding repair of a complex ventral incisional hernia.  The patient has a complex surgical and medical history.  He has a history of open surgery for pancreatitis, pseudocyst, and possible  partial pancreatectomy at [facility] in [place]. Many years ago. He developed a hernia and underwent open repair of ventral hernia with mesh at [facility] in [place] in 2008. He thinks the hernia recurred about a year later. He has morbid obesity and states he's gained 100 pounds in 2 years. Current BMI 44.94. He states that he has chronic abdominal pain with sharp pain, dull pain, spasms, cramps diffusely. His appetite is good. His bowel functions are normal.  Other comorbidities include a history of vent. dependent respiratory failure, tracheostomy ... this was after his pancreatic surgery. He has nonischemic cardiomyopathy and atrial fibrillation, followed by [facility] [place]. Cardiac murmur. Congestive heart failure. Hypertension. He is also followed by Dr. Venda Rodes for monoclonal gammopathy of uncertain significance. His [facility] physician has told him he is not a good candidate for operative intervention.  I have reviewed a CT scan that was done in [place] all in 06/29/2012. 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 This is a large defect. No obstruction. No inflammatory change. There is evidence of prior cholecystectomy and pancreatic surgery. Chronic pancreatitis with calcifications, atrophy, and ductal dilatation.  He states he has had a history of a pancreatic duct stent which was placed at [facility] but has now been removed a few years ago. He is in no acute distress  today.  HPI  Past Medical History  Diagnosis Date  . 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 Atrial fibrillation   . CHF (congestive heart failure)     Chronic systolic heart failure with an ejection fraction of 40%- 45%  . Morbid obesity   . Chronic pain   . Narcotic dependence, episodic use     chronic narcotic use  . Respiratory failure     History of ventilatory-dependent respiratory failure and tracheostomy   . History of renal failure     requiring hemodialysis in the past with normal renal function at this point  . History of ventral hernia repair   . Cervical compression fracture     Cervical disk disease requiring surgery  . Hypertension   . Hyperlipidemia   . Degenerative joint disease   . Heart murmur     Past Surgical History  Procedure Laterality Date  . Ventral hernia repair    . Cholecystectomy    . Tracheostomy    . Pancreas surgery    . Pancreatic stents    . Skin graft    . Insertion of mesh      Family History  Problem Relation Age of Onset  . Hypertension Mother   . Diabetes Mother   . Lymphoma Father     Social History History  Substance Use Topics  . Smoking status: Never Smoker   . Smokeless tobacco: Not on file  . Alcohol  Use: No    Allergies  Allergen Reactions  . Morphine And Related Nausea And Vomiting    Current Outpatient Prescriptions  Medication Sig Dispense Refill  . Amylase-Lipase-Protease (PANCREASE PO) Take 2 capsules by mouth 3 (three) times daily with meals.       Marland Kitchen aspirin 81 MG tablet Take 81 mg by mouth daily before breakfast.       . Cyanocobalamin (VITAMIN B 12 PO) Take 2 tablets by mouth daily.       . digoxin (LANOXIN) 0.125 MG tablet Take 125 mcg by mouth every morning.       Chilton Si Tea, Camillia sinensis, (GREEN TEA PO) Take 2 tablets by mouth daily.       Marland Kitchen  HYDROcodone-acetaminophen (NORCO) 5-325 MG per tablet Take 1 tablet by mouth every 6 (six) hours as needed. Pain       . ibuprofen (ADVIL,MOTRIN) 200 MG tablet Take 200 mg by mouth every 6 (six) hours as needed for pain.      Marland Kitchen lisinopril (PRINIVIL,ZESTRIL) 10 MG tablet Take 10 mg by mouth daily.       . metoprolol (TOPROL-XL) 50 MG 24 hr tablet Take 50 mg by mouth every morning.       Marland Kitchen oxyCODONE (OXY IR/ROXICODONE) 5 MG immediate release tablet Take 10 mg by mouth every 6 (six) hours as needed for pain.      . furosemide (LASIX) 40 MG tablet Take 40 mg by mouth daily as needed. Fluid       No current facility-administered medications for this visit.    Review of Systems Review of Systems  Constitutional: Positive for unexpected weight change. Negative for fever and chills.  HENT: Negative for congestion, hearing loss, sore throat, trouble swallowing and voice change.   Eyes: Negative for visual disturbance.  Respiratory: Positive for cough, chest tightness and shortness of breath. Negative for wheezing.   Cardiovascular: Positive for palpitations and leg swelling. Negative for chest pain.  Gastrointestinal: Positive for abdominal pain. Negative for nausea, vomiting, diarrhea, constipation, blood in stool, abdominal distention, anal bleeding and rectal pain.  Genitourinary: Negative for hematuria and difficulty urinating.  Musculoskeletal: Positive for arthralgias and myalgias.  Skin: Negative for rash and wound.  Neurological: Negative for seizures, syncope, weakness and headaches.  Hematological: Negative for adenopathy. Does not bruise/bleed easily.  Psychiatric/Behavioral: Negative for confusion.    Blood pressure 140/90, pulse 84, resp. rate 24, height 6' (1.829 m), weight 331 lb 6 oz (150.311 kg).  Physical Exam Physical Exam  Constitutional: He is oriented to person, place, and time. He appears well-developed and well-nourished. No distress.  BMI 44.9  HENT:  Head:  Normocephalic.  Nose: Nose normal.  Mouth/Throat: No oropharyngeal exudate.  Eyes: Conjunctivae and EOM are normal. Pupils are equal, round, and reactive to light. Right eye exhibits no discharge. Left eye exhibits no discharge. No scleral icterus.  Neck: Normal range of motion. Neck supple. No JVD present. No tracheal deviation present. No thyromegaly present.  Cardiovascular: Normal rate, regular rhythm, normal heart sounds and intact distal pulses.   No murmur heard. Pulmonary/Chest: Effort normal and breath sounds normal. No stridor. No respiratory distress. He has no wheezes. He has no rales. He exhibits no tenderness.  He became very short of breath simply getting on and  Off of the exam table. He moves very slowly.  Abdominal: Soft. Bowel sounds are normal. He exhibits no distension and no mass. There is no tenderness. There is no rebound and  no guarding.  Morbid obesity. Bilateral subcostal chevron incision healed with skin intact. Massive abdomen.Soft without significant tenderness. The hernia defect was so large I cannot really feel the edges. Not particularly tender.  Musculoskeletal: Normal range of motion. He exhibits edema. He exhibits no tenderness.  Lymphadenopathy:    He has no cervical adenopathy.  Neurological: He is alert and oriented to person, place, and time. He has normal reflexes. Coordination normal.  Skin: Skin is warm and dry. No rash noted. He is not diaphoretic. No erythema. No pallor.  Psychiatric: He has a normal mood and affect. His behavior is normal. Judgment and thought content normal.    Data Reviewed CT scan from [place]. Outpatient records from [place]. Authorization from [place] for single outpatient visit. They state that I am not authorized for services at the expense of the [facility] other than this visit.  Assessment    Massive, recurrent ventral hernia. Most likely he is not an operative candidate from a technical standpoint. He is certainly an extremely  poor candidate from a medical standpoint.  No evidence of ischemic or obstructive intestinal problems related to his hernia.  History of surgery for pancreatic pseudocyst and pancreatitis  History of ventillator dependent respirator failure with tracheostomy  History of ventral hernia repair with mesh-failed.  Nonischemic cardiomyopathy  Atrial fibrillation  Congestive heart failure  Morbid obesity  Hypertension  Monoclonal gammopathy of uncertain significance     Plan    I told this patient that we would be unable to repair his hernia in [place], and probably he was not an operative candidate.  I told him that his risk for incarceration and strangulation was low because of the very large open mouth of the hernia  I told him that if  he wishes  another surgical opinion to ask his [facility] physician to refer him to a university center such as Dr. Marvene Staff in [place] or Dr. Barnetta Chapel at [facility]. I told them that it was possible they would also decline to do the surgery because of the very high risk for complications and death.  He asked about weight reduction and bariatric surgery. I told him that he probably was not a candidate for bariatric surgery, but if he wanted a bariatric surgery opinion to ask his primary physician at the  [place] to refer him  Return to see [place] as needed.        Angelia Mould. Derrell Lolling, M.D., [facility] General and Minimally invasive Surgery Breast and Colorectal Surgery Office:   [phone] Pager:   [phone]  12/29/2012, 11:04 AM

## 2012-12-29 NOTE — Patient Instructions (Signed)
Your ventral incisional hernia has recurred. It is a giant hernia and the muscle separation is probably greater than can be repaired with surgery.  Your options at this point in time are to live with the hernia, and that is reasonable since it is so large it is unlikely to cause a bowel obstruction or strangulation. Another option is to be referred to [facility] or [facility] to a hernia specialist.  In terms of weight reduction, the only surgical solution is a bariatric procedure. You also may be at too high a risk for that considering your surgical and medical history. Your physician at the [facility] can consider consultation with a bariatric surgeon if you wish. My instinct is that you would be better served by weight reduction with medical measures.  There is nothing we can do to repair your hernia surgically here in [place]. In general, I think that you are not a good operative candidate for that surgery.  Return to see [place] as needed.

## 2013-04-30 ENCOUNTER — Emergency Department (HOSPITAL_COMMUNITY): Payer: Non-veteran care

## 2013-04-30 ENCOUNTER — Inpatient Hospital Stay (HOSPITAL_COMMUNITY)
Admission: EM | Admit: 2013-04-30 | Discharge: 2013-05-17 | DRG: 070 | Disposition: A | Discharge status: Home Health | Payer: Non-veteran care | Attending: Internal Medicine | Admitting: Internal Medicine

## 2013-04-30 ENCOUNTER — Encounter (HOSPITAL_COMMUNITY): Payer: Self-pay | Admitting: Emergency Medicine

## 2013-04-30 DIAGNOSIS — K861 Other chronic pancreatitis: Secondary | ICD-10-CM

## 2013-04-30 DIAGNOSIS — I428 Other cardiomyopathies: Secondary | ICD-10-CM

## 2013-04-30 DIAGNOSIS — D649 Anemia, unspecified: Secondary | ICD-10-CM | POA: Diagnosis not present

## 2013-04-30 DIAGNOSIS — E872 Acidosis, unspecified: Secondary | ICD-10-CM

## 2013-04-30 DIAGNOSIS — Z79899 Other long term (current) drug therapy: Secondary | ICD-10-CM

## 2013-04-30 DIAGNOSIS — E876 Hypokalemia: Secondary | ICD-10-CM

## 2013-04-30 DIAGNOSIS — K859 Acute pancreatitis without necrosis or infection, unspecified: Secondary | ICD-10-CM

## 2013-04-30 DIAGNOSIS — R404 Transient alteration of awareness: Secondary | ICD-10-CM

## 2013-04-30 DIAGNOSIS — E785 Hyperlipidemia, unspecified: Secondary | ICD-10-CM | POA: Diagnosis present

## 2013-04-30 DIAGNOSIS — F10231 Alcohol dependence with withdrawal delirium: Secondary | ICD-10-CM | POA: Diagnosis present

## 2013-04-30 DIAGNOSIS — G609 Hereditary and idiopathic neuropathy, unspecified: Secondary | ICD-10-CM

## 2013-04-30 DIAGNOSIS — F1020 Alcohol dependence, uncomplicated: Secondary | ICD-10-CM | POA: Diagnosis present

## 2013-04-30 DIAGNOSIS — Z7982 Long term (current) use of aspirin: Secondary | ICD-10-CM

## 2013-04-30 DIAGNOSIS — E662 Morbid (severe) obesity with alveolar hypoventilation: Secondary | ICD-10-CM | POA: Diagnosis present

## 2013-04-30 DIAGNOSIS — D696 Thrombocytopenia, unspecified: Secondary | ICD-10-CM | POA: Diagnosis present

## 2013-04-30 DIAGNOSIS — I4891 Unspecified atrial fibrillation: Secondary | ICD-10-CM

## 2013-04-30 DIAGNOSIS — E875 Hyperkalemia: Secondary | ICD-10-CM

## 2013-04-30 DIAGNOSIS — F10931 Alcohol use, unspecified with withdrawal delirium: Secondary | ICD-10-CM | POA: Diagnosis present

## 2013-04-30 DIAGNOSIS — K5900 Constipation, unspecified: Secondary | ICD-10-CM | POA: Diagnosis not present

## 2013-04-30 DIAGNOSIS — E869 Volume depletion, unspecified: Secondary | ICD-10-CM | POA: Diagnosis present

## 2013-04-30 DIAGNOSIS — J9600 Acute respiratory failure, unspecified whether with hypoxia or hypercapnia: Secondary | ICD-10-CM

## 2013-04-30 DIAGNOSIS — R1310 Dysphagia, unspecified: Secondary | ICD-10-CM | POA: Diagnosis present

## 2013-04-30 DIAGNOSIS — E1100 Type 2 diabetes mellitus with hyperosmolarity without nonketotic hyperglycemic-hyperosmolar coma (NKHHC): Secondary | ICD-10-CM

## 2013-04-30 DIAGNOSIS — E871 Hypo-osmolality and hyponatremia: Secondary | ICD-10-CM | POA: Diagnosis not present

## 2013-04-30 DIAGNOSIS — I38 Endocarditis, valve unspecified: Secondary | ICD-10-CM

## 2013-04-30 DIAGNOSIS — G9341 Metabolic encephalopathy: Principal | ICD-10-CM | POA: Diagnosis present

## 2013-04-30 DIAGNOSIS — I10 Essential (primary) hypertension: Secondary | ICD-10-CM | POA: Diagnosis present

## 2013-04-30 DIAGNOSIS — G4733 Obstructive sleep apnea (adult) (pediatric): Secondary | ICD-10-CM | POA: Diagnosis present

## 2013-04-30 DIAGNOSIS — E1101 Type 2 diabetes mellitus with hyperosmolarity with coma: Secondary | ICD-10-CM

## 2013-04-30 DIAGNOSIS — Z6841 Body Mass Index (BMI) 40.0 and over, adult: Secondary | ICD-10-CM

## 2013-04-30 DIAGNOSIS — G894 Chronic pain syndrome: Secondary | ICD-10-CM | POA: Diagnosis present

## 2013-04-30 DIAGNOSIS — E874 Mixed disorder of acid-base balance: Secondary | ICD-10-CM | POA: Diagnosis present

## 2013-04-30 DIAGNOSIS — J189 Pneumonia, unspecified organism: Secondary | ICD-10-CM | POA: Diagnosis present

## 2013-04-30 DIAGNOSIS — I5043 Acute on chronic combined systolic (congestive) and diastolic (congestive) heart failure: Secondary | ICD-10-CM

## 2013-04-30 DIAGNOSIS — R509 Fever, unspecified: Secondary | ICD-10-CM

## 2013-04-30 DIAGNOSIS — E870 Hyperosmolality and hypernatremia: Secondary | ICD-10-CM | POA: Diagnosis not present

## 2013-04-30 DIAGNOSIS — R4182 Altered mental status, unspecified: Secondary | ICD-10-CM

## 2013-04-30 DIAGNOSIS — R319 Hematuria, unspecified: Secondary | ICD-10-CM | POA: Diagnosis present

## 2013-04-30 DIAGNOSIS — N179 Acute kidney failure, unspecified: Secondary | ICD-10-CM

## 2013-04-30 DIAGNOSIS — R410 Disorientation, unspecified: Secondary | ICD-10-CM

## 2013-04-30 DIAGNOSIS — I509 Heart failure, unspecified: Secondary | ICD-10-CM

## 2013-04-30 DIAGNOSIS — H43399 Other vitreous opacities, unspecified eye: Secondary | ICD-10-CM | POA: Diagnosis present

## 2013-04-30 DIAGNOSIS — K430 Incisional hernia with obstruction, without gangrene: Secondary | ICD-10-CM

## 2013-04-30 HISTORY — DX: Monoclonal gammopathy: D47.2

## 2013-04-30 LAB — RAPID URINE DRUG SCREEN, HOSP PERFORMED
AMPHETAMINES: NOT DETECTED
BENZODIAZEPINES: NOT DETECTED
Barbiturates: NOT DETECTED
Cocaine: NOT DETECTED
Opiates: NOT DETECTED
Tetrahydrocannabinol: NOT DETECTED

## 2013-04-30 LAB — GLUCOSE, CAPILLARY

## 2013-04-30 LAB — POCT I-STAT 3, ART BLOOD GAS (G3+)
Acid-base deficit: 5 mmol/L — ABNORMAL HIGH (ref 0.0–2.0)
BICARBONATE: 23.9 meq/L (ref 20.0–24.0)
O2 Saturation: 97 %
PH ART: 7.232 — AB (ref 7.350–7.450)
TCO2: 26 mmol/L (ref 0–100)
pCO2 arterial: 57.4 mmHg (ref 35.0–45.0)
pO2, Arterial: 117 mmHg — ABNORMAL HIGH (ref 80.0–100.0)

## 2013-04-30 LAB — CBC WITH DIFFERENTIAL/PLATELET
BASOS ABS: 0 10*3/uL (ref 0.0–0.1)
BASOS PCT: 0 % (ref 0–1)
EOS PCT: 1 % (ref 0–5)
Eosinophils Absolute: 0.1 10*3/uL (ref 0.0–0.7)
HEMATOCRIT: 47.7 % (ref 39.0–52.0)
Hemoglobin: 15.4 g/dL (ref 13.0–17.0)
Lymphocytes Relative: 35 % (ref 12–46)
Lymphs Abs: 1.8 10*3/uL (ref 0.7–4.0)
MCH: 28.3 pg (ref 26.0–34.0)
MCHC: 32.3 g/dL (ref 30.0–36.0)
MCV: 87.7 fL (ref 78.0–100.0)
MONO ABS: 0.2 10*3/uL (ref 0.1–1.0)
Monocytes Relative: 3 % (ref 3–12)
NEUTROS ABS: 3.1 10*3/uL (ref 1.7–7.7)
Neutrophils Relative %: 60 % (ref 43–77)
Platelets: 123 10*3/uL — ABNORMAL LOW (ref 150–400)
RBC: 5.44 MIL/uL (ref 4.22–5.81)
RDW: 14.9 % (ref 11.5–15.5)
WBC: 5.1 10*3/uL (ref 4.0–10.5)

## 2013-04-30 LAB — URINE MICROSCOPIC-ADD ON

## 2013-04-30 LAB — URINALYSIS, ROUTINE W REFLEX MICROSCOPIC
Bilirubin Urine: NEGATIVE
Ketones, ur: NEGATIVE mg/dL
LEUKOCYTES UA: NEGATIVE
Nitrite: NEGATIVE
PH: 6 (ref 5.0–8.0)
Protein, ur: 30 mg/dL — AB
Specific Gravity, Urine: 1.034 — ABNORMAL HIGH (ref 1.005–1.030)
Urobilinogen, UA: 0.2 mg/dL (ref 0.0–1.0)

## 2013-04-30 LAB — COMPREHENSIVE METABOLIC PANEL
ALBUMIN: 3.5 g/dL (ref 3.5–5.2)
ALT: 20 U/L (ref 0–53)
AST: 24 U/L (ref 0–37)
Alkaline Phosphatase: 127 U/L — ABNORMAL HIGH (ref 39–117)
BUN: 20 mg/dL (ref 6–23)
CALCIUM: 8.5 mg/dL (ref 8.4–10.5)
CO2: 18 mEq/L — ABNORMAL LOW (ref 19–32)
CREATININE: 1.26 mg/dL (ref 0.50–1.35)
Chloride: 95 mEq/L — ABNORMAL LOW (ref 96–112)
GFR calc Af Amer: 71 mL/min — ABNORMAL LOW (ref >90)
GFR calc non Af Amer: 61 mL/min — ABNORMAL LOW (ref >90)
Glucose, Bld: 976 mg/dL (ref 70–99)
Potassium: 3.9 mEq/L (ref 3.7–5.3)
Sodium: 137 mEq/L (ref 137–147)
Total Bilirubin: 0.4 mg/dL (ref 0.3–1.2)
Total Protein: 8.8 g/dL — ABNORMAL HIGH (ref 6.0–8.3)

## 2013-04-30 LAB — POCT I-STAT, CHEM 8
BUN: 22 mg/dL (ref 6–23)
CHLORIDE: 96 meq/L (ref 96–112)
Calcium, Ion: 1.14 mmol/L (ref 1.12–1.23)
Creatinine, Ser: 1.4 mg/dL — ABNORMAL HIGH (ref 0.50–1.35)
Glucose, Bld: >700 mg/dL (ref 70–99)
HEMATOCRIT: 54 % — AB (ref 39.0–52.0)
Hemoglobin: 18.4 g/dL — ABNORMAL HIGH (ref 13.0–17.0)
Potassium: 3.7 mEq/L (ref 3.7–5.3)
Sodium: 138 mEq/L (ref 137–147)
TCO2: 21 mmol/L (ref 0–100)

## 2013-04-30 LAB — LACTIC ACID, PLASMA: Lactic Acid, Venous: 8.7 mmol/L — ABNORMAL HIGH (ref 0.5–2.2)

## 2013-04-30 LAB — HIV RAPID SCREEN (BLD OR BODY FLD EXPOSURE): Rapid HIV Screen: NONREACTIVE

## 2013-04-30 LAB — TROPONIN I: Troponin I: <0.3 ng/mL (ref <0.30)

## 2013-04-30 LAB — KETONES, QUALITATIVE: Acetone, Bld: NEGATIVE

## 2013-04-30 MED ORDER — DEXTROSE-NACL 5-0.45 % IV SOLN
INTRAVENOUS | Status: DC
  Administered 2013-04-30: via INTRAVENOUS

## 2013-04-30 MED ORDER — PIPERACILLIN-TAZOBACTAM 3.375 G IVPB
3.3750 g | Freq: Once | INTRAVENOUS | Status: AC
  Administered 2013-05-01: 3.375 g via INTRAVENOUS
  Filled 2013-04-30: qty 50

## 2013-04-30 MED ORDER — SODIUM CHLORIDE 0.9 % IV SOLN: INTRAVENOUS | Status: DC

## 2013-04-30 MED ORDER — VANCOMYCIN HCL 10 G IV SOLR
2000.0000 mg | Freq: Once | INTRAVENOUS | Status: AC
  Administered 2013-04-30: 2000 mg via INTRAVENOUS
  Filled 2013-04-30: qty 2000

## 2013-04-30 MED ORDER — SODIUM CHLORIDE 0.9 % IV SOLN
INTRAVENOUS | Status: AC
  Administered 2013-04-30: 22:00:00 via INTRAVENOUS

## 2013-04-30 MED ORDER — DEXTROSE 50 % IV SOLN: 25.0000 mL | INTRAVENOUS | Status: DC | PRN

## 2013-04-30 MED ORDER — MIDAZOLAM HCL 2 MG/2ML IJ SOLN
INTRAMUSCULAR | Status: AC
  Filled 2013-04-30: qty 6

## 2013-04-30 MED ORDER — MIDAZOLAM HCL 2 MG/2ML IJ SOLN
INTRAMUSCULAR | Status: AC
  Administered 2013-04-30: 6 mg
  Filled 2013-04-30: qty 6

## 2013-04-30 MED ORDER — POTASSIUM CHLORIDE 10 MEQ/100ML IV SOLN: 10.0000 meq | INTRAVENOUS | Status: AC

## 2013-04-30 MED ORDER — SODIUM CHLORIDE 0.9 % IV SOLN
INTRAVENOUS | Status: DC
  Administered 2013-05-01: 5.4 [IU]/h via INTRAVENOUS
  Administered 2013-05-01: 13:00:00 via INTRAVENOUS
  Filled 2013-04-30 (×2): qty 1

## 2013-04-30 MED ORDER — MIDAZOLAM HCL 2 MG/2ML IJ SOLN
4.0000 mg | Freq: Once | INTRAMUSCULAR | Status: DC
  Filled 2013-04-30 (×2): qty 4

## 2013-04-30 NOTE — ED Notes (Signed)
Daughters (2) at bedside.  Stated that for the past several days he has been drinking a lot and having to urinate a lot.  Tonight he was at home with another daughter and went into the BR and fell in the floor.

## 2013-04-30 NOTE — ED Provider Notes (Signed)
CSN: [id number]     Arrival date & time 04/30/13  2110 History   First MD Initiated Contact with Patient 04/30/13 2113     Chief Complaint  Patient presents with  . Fall  . Aggressive Behavior    HPI: Don Brown is a 58 yo M with history of chronic pancreatitis, DM, atrial fibrillation, CHF and HTN who presents with altered mental status. The patient is unable to provide history given his altered mental status. His daughters and EMS provide history. He has had increased fluid intake and urination over the last couple of days. This evening he went into the bathroom and collapsed. His daughters called 62. On EMS arrival the patient was alerted but also combative, he required Haldol 5 mg and Versed 5 mg with improvement of his combativeness. He is noted to be tachycardic and hypertensive. No obvious external trauma noted.    Past Medical History  Diagnosis Date  . 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 Atrial fibrillation   . CHF (congestive heart failure)     Chronic systolic heart failure with an ejection fraction of 40%- 45%  . Morbid obesity   . Chronic pain   . Narcotic dependence, episodic use     chronic narcotic use  . Respiratory failure     History of ventilatory-dependent respiratory failure and tracheostomy   . History of renal failure     requiring hemodialysis in the past with normal renal function at this point  . History of ventral hernia repair   . Cervical compression fracture     Cervical disk disease requiring surgery  . Hypertension   . Hyperlipidemia   . Degenerative joint disease   . Heart murmur    Past Surgical History  Procedure Laterality Date  . Ventral hernia repair    . Cholecystectomy    . Tracheostomy    . Pancreas surgery    . Pancreatic stents    . Skin graft    . Insertion of mesh     Family History   Problem Relation Age of Onset  . Hypertension Mother   . Diabetes Mother   . Lymphoma Father    History  Substance Use Topics  . Smoking status: Never Smoker   . Smokeless tobacco: Not on file  . Alcohol Use: No    Review of Systems  Unable to perform ROS: Mental status change    Allergies  Morphine and related  Home Medications   Current Outpatient Rx  Name  Route  Sig  Dispense  Refill  . Amylase-Lipase-Protease (PANCREASE PO)   Oral   Take 2 capsules by mouth 3 (three) times daily with meals.          . digoxin (LANOXIN) 0.125 MG tablet   Oral   Take 125 mcg by mouth every morning.          . furosemide (LASIX) 40 MG tablet   Oral   Take 40 mg by mouth daily as needed. Fluid         . metoprolol (TOPROL-XL) 50 MG 24 hr tablet   Oral   Take 50 mg by mouth every morning.          Marland Kitchen oxyCODONE (OXY IR/ROXICODONE) 5 MG immediate release tablet   Oral   Take 10 mg by mouth every 6 (six) hours as needed for pain.         Marland Kitchen  promethazine (PHENERGAN) 25 MG tablet   Oral   Take 25 mg by mouth every 8 (eight) hours as needed for nausea or vomiting.         Marland Kitchen aspirin 81 MG tablet   Oral   Take 81 mg by mouth daily before breakfast.          . Cyanocobalamin (VITAMIN B 12 PO)   Oral   Take 2 tablets by mouth daily.          Nyoka Cowden Tea, Camillia sinensis, (GREEN TEA PO)   Oral   Take 2 tablets by mouth daily.          Marland Kitchen HYDROcodone-acetaminophen (NORCO) 5-325 MG per tablet   Oral   Take 1 tablet by mouth every 6 (six) hours as needed. Pain          . ibuprofen (ADVIL,MOTRIN) 200 MG tablet   Oral   Take 200 mg by mouth every 6 (six) hours as needed for pain.         Marland Kitchen lisinopril (PRINIVIL,ZESTRIL) 10 MG tablet   Oral   Take 10 mg by mouth daily.           BP 155/99  Pulse 148  Temp(Src) 100.6 F (38.1 C) (Rectal)  Resp 22  SpO2 99% Physical Exam  Nursing note and vitals reviewed. Constitutional: He appears lethargic. He is  uncooperative. He is easily aroused. He appears toxic. Face mask in place.  Morbidly obese male, laying in bed, alerted, looking around, does not respond to questions. Moves all extremities. No gross neurologic deficits noted.   HENT:  Head: Normocephalic and atraumatic.  Mouth/Throat: Mucous membranes are dry.  Eyes: Right conjunctiva is injected. Left conjunctiva is injected.  Pupils 2 mm, minimally reactive.   Neck: Neck supple. JVD present.  Cardiovascular: Regular rhythm and intact distal pulses.  Tachycardia present.  Exam reveals distant heart sounds.   Pulmonary/Chest: Accessory muscle usage present. Tachypnea noted. No respiratory distress. He has decreased breath sounds (in all lung fields). He has rales (at bases).  Abdominal: Soft. He exhibits distension. Bowel sounds are absent. There is tenderness. There is no rigidity.  Large surgical scar across abdomen   Musculoskeletal: He exhibits edema (1+ LE edema).  Neurological: He is easily aroused. He appears lethargic. He is disoriented. GCS eye subscore is 2. GCS verbal subscore is 3. GCS motor subscore is 5.  No focal neurologic deficits but exam limited due to his combativeness. He is moving all extremities.   Skin: Skin is warm and dry. No rash noted.  Psychiatric: He is agitated.    ED Course  Procedures (including critical care time) Labs Review Labs Reviewed  CBC WITH DIFFERENTIAL - Abnormal; Notable for the following:    Platelets 123 (*)    All other components within normal limits  COMPREHENSIVE METABOLIC PANEL - Abnormal; Notable for the following:    Chloride 95 (*)    CO2 18 (*)    Glucose, Bld 976 (*)    Total Protein 8.8 (*)    Alkaline Phosphatase 127 (*)    GFR calc non Af Amer 61 (*)    GFR calc Af Amer 71 (*)    All other components within normal limits  LACTIC ACID, PLASMA - Abnormal; Notable for the following:    Lactic Acid, Venous 8.7 (*)    All other components within normal limits  URINALYSIS,  ROUTINE W REFLEX MICROSCOPIC - Abnormal; Notable for the following:  Color, Urine STRAW (*)    APPearance CLOUDY (*)    Specific Gravity, Urine 1.034 (*)    Glucose, UA >1000 (*)    Hgb urine dipstick TRACE (*)    Protein, ur 30 (*)    All other components within normal limits  URINE MICROSCOPIC-ADD ON - Abnormal; Notable for the following:    Bacteria, UA FEW (*)    All other components within normal limits  POCT I-STAT, CHEM 8 - Abnormal; Notable for the following:    Creatinine, Ser 1.40 (*)    Glucose, Bld >700 (*)    Hemoglobin 18.4 (*)    HCT 54.0 (*)    All other components within normal limits  POCT I-STAT 3, BLOOD GAS (G3+) - Abnormal; Notable for the following:    pH, Arterial 7.232 (*)    pCO2 arterial 57.4 (*)    pO2, Arterial 117.0 (*)    Acid-base deficit 5.0 (*)    All other components within normal limits  CULTURE, BLOOD (ROUTINE X 2)  CULTURE, BLOOD (ROUTINE X 2)  URINE RAPID DRUG SCREEN (HOSP PERFORMED)  TROPONIN I  KETONES, QUALITATIVE  HIV RAPID SCREEN (BLD OR BODY FLD EXPOSURE)  BLOOD GAS, ARTERIAL  HEPATITIS B SURFACE ANTIGEN  HEPATITIS C ANTIBODY (REFLEX)  BASIC METABOLIC PANEL  BASIC METABOLIC PANEL  BASIC METABOLIC PANEL  BASIC METABOLIC PANEL  CBC   Imaging Review Ct Head Wo Contrast  04/30/2013   CLINICAL DATA:  Altered mental status.  EXAM: CT HEAD WITHOUT CONTRAST  TECHNIQUE: Contiguous axial images were obtained from the base of the skull through the vertex without intravenous contrast.  COMPARISON:  None.  FINDINGS: Mild cerebral atrophy. No acute intracranial abnormality. Specifically, no hemorrhage, hydrocephalus, mass lesion, acute infarction, or significant intracranial injury. No acute calvarial abnormality. Visualized paranasal sinuses and mastoids clear. Orbital soft tissues unremarkable.  IMPRESSION: No acute intracranial abnormality.  Mild cerebral atrophy.   Electronically Signed   By: Rolm Baptise M.D.   On: 04/30/2013 21:41       MDM   58 yo M with history of chronic pancreatitis, DM, atrial fibrillation, CHF and HTN who presents with altered mental status. On arrival he is combative, requiring versed and haldol by EMS. His GCS is 10, protecting his airway initially. CBG is >500. Initial concern for ICH given apparent acute onset of symptoms. Taken emergently to head CT which was negative for acute abnormalities. Once back from CT his labs were concerning for [facility] given glucose of 975, CO2 19, lactic acid 8. Mild AKI (cr 1.4). Also noted to be febrile, considered meningitis or encephalitis but felt to be unlikely as his neck is supple, AMS likely due to significant metabolic abnormalities. I did give him empiric, broad spectrum antibiotics given fever. CXR with possible consolidation vs edema. UA not infected. Again feel the patient has HONC, started IVF's and insulin gtt. He was admitted to critical care for further evaluation. I spoke to the patient's family about his diagnosis about work up and need for admission, they were in agreement with plan.   Reviewed imaging, labs, ECG and previous medical records, utilized in MDM  Discussed case with Dr. Aline Brochure  Clinical Impression 1. Hyperosmolar non-ketotic coma 2. Respiratory acidosis 3. AG metabolic acidosis 4. SIRS criteria, no obvious infection    Louretta Shorten, MD 05/02/13 [id number]

## 2013-04-30 NOTE — ED Notes (Signed)
Patient arrived via EMS.  EMS reports he was very combative at home and in the truck.  Originally patient was not going to come to the ED.  During transport patient was combative and was given Versed 5mg  IV and Haldol 5mg  IM.

## 2013-04-30 NOTE — ED Notes (Signed)
Back from CT

## 2013-04-30 NOTE — Progress Notes (Signed)
Patient awakened and became combative and stated he couldn't breath. Nurse removed Bipap and placed patient on 100% NRB mask. Sp02 at this time is 99%.

## 2013-04-30 NOTE — ED Notes (Signed)
Patient transported to CT 

## 2013-05-01 ENCOUNTER — Inpatient Hospital Stay (HOSPITAL_COMMUNITY): Payer: Non-veteran care

## 2013-05-01 ENCOUNTER — Emergency Department (HOSPITAL_COMMUNITY): Payer: Non-veteran care

## 2013-05-01 ENCOUNTER — Encounter (HOSPITAL_COMMUNITY): Payer: Self-pay | Admitting: Radiology

## 2013-05-01 DIAGNOSIS — K861 Other chronic pancreatitis: Secondary | ICD-10-CM | POA: Diagnosis present

## 2013-05-01 DIAGNOSIS — I369 Nonrheumatic tricuspid valve disorder, unspecified: Secondary | ICD-10-CM

## 2013-05-01 DIAGNOSIS — E872 Acidosis, unspecified: Secondary | ICD-10-CM | POA: Diagnosis present

## 2013-05-01 DIAGNOSIS — R4182 Altered mental status, unspecified: Secondary | ICD-10-CM

## 2013-05-01 DIAGNOSIS — R404 Transient alteration of awareness: Secondary | ICD-10-CM

## 2013-05-01 DIAGNOSIS — J9600 Acute respiratory failure, unspecified whether with hypoxia or hypercapnia: Secondary | ICD-10-CM | POA: Diagnosis present

## 2013-05-01 DIAGNOSIS — E1100 Type 2 diabetes mellitus with hyperosmolarity without nonketotic hyperglycemic-hyperosmolar coma (NKHHC): Secondary | ICD-10-CM | POA: Diagnosis present

## 2013-05-01 DIAGNOSIS — R410 Disorientation, unspecified: Secondary | ICD-10-CM | POA: Diagnosis present

## 2013-05-01 LAB — POCT I-STAT 3, ART BLOOD GAS (G3+)
ACID-BASE DEFICIT: 5 mmol/L — AB (ref 0.0–2.0)
ACID-BASE DEFICIT: 5 mmol/L — AB (ref 0.0–2.0)
Acid-base deficit: 5 mmol/L — ABNORMAL HIGH (ref 0.0–2.0)
Acid-base deficit: 6 mmol/L — ABNORMAL HIGH (ref 0.0–2.0)
BICARBONATE: 21.9 meq/L (ref 20.0–24.0)
Bicarbonate: 22.2 mEq/L (ref 20.0–24.0)
Bicarbonate: 21.5 mEq/L (ref 20.0–24.0)
Bicarbonate: 20.5 mEq/L (ref 20.0–24.0)
O2 Saturation: 97 %
O2 Saturation: 100 %
O2 Saturation: 100 %
O2 Saturation: 96 %
PCO2 ART: 50.8 mmHg — AB (ref 35.0–45.0)
PH ART: 7.248 — AB (ref 7.350–7.450)
PH ART: 7.226 — AB (ref 7.350–7.450)
Patient temperature: 99.2
Patient temperature: 98.6
TCO2: 24 mmol/L (ref 0–100)
TCO2: 23 mmol/L (ref 0–100)
TCO2: 23 mmol/L (ref 0–100)
TCO2: 22 mmol/L (ref 0–100)
pCO2 arterial: 52.9 mmHg — ABNORMAL HIGH (ref 35.0–45.0)
pCO2 arterial: 44.2 mmHg (ref 35.0–45.0)
pCO2 arterial: 40.5 mmHg (ref 35.0–45.0)
pH, Arterial: 7.296 — ABNORMAL LOW (ref 7.350–7.450)
pH, Arterial: 7.313 — ABNORMAL LOW (ref 7.350–7.450)
pO2, Arterial: 105 mmHg — ABNORMAL HIGH (ref 80.0–100.0)
pO2, Arterial: 234 mmHg — ABNORMAL HIGH (ref 80.0–100.0)
pO2, Arterial: 225 mmHg — ABNORMAL HIGH (ref 80.0–100.0)
pO2, Arterial: 90 mmHg (ref 80.0–100.0)

## 2013-05-01 LAB — GLUCOSE, CAPILLARY
GLUCOSE-CAPILLARY: 143 mg/dL — AB (ref 70–99)
GLUCOSE-CAPILLARY: 166 mg/dL — AB (ref 70–99)
GLUCOSE-CAPILLARY: 187 mg/dL — AB (ref 70–99)
GLUCOSE-CAPILLARY: 173 mg/dL — AB (ref 70–99)
GLUCOSE-CAPILLARY: 176 mg/dL — AB (ref 70–99)
GLUCOSE-CAPILLARY: 207 mg/dL — AB (ref 70–99)
GLUCOSE-CAPILLARY: 169 mg/dL — AB (ref 70–99)
Glucose-Capillary: >600 mg/dL (ref 70–99)
Glucose-Capillary: 347 mg/dL — ABNORMAL HIGH (ref 70–99)
Glucose-Capillary: 187 mg/dL — ABNORMAL HIGH (ref 70–99)
Glucose-Capillary: 160 mg/dL — ABNORMAL HIGH (ref 70–99)
Glucose-Capillary: 190 mg/dL — ABNORMAL HIGH (ref 70–99)
Glucose-Capillary: 194 mg/dL — ABNORMAL HIGH (ref 70–99)
Glucose-Capillary: 158 mg/dL — ABNORMAL HIGH (ref 70–99)
Glucose-Capillary: 98 mg/dL (ref 70–99)
Glucose-Capillary: 131 mg/dL — ABNORMAL HIGH (ref 70–99)
Glucose-Capillary: 193 mg/dL — ABNORMAL HIGH (ref 70–99)

## 2013-05-01 LAB — CBC
HCT: 38.3 % — ABNORMAL LOW (ref 39.0–52.0)
HCT: 39.3 % (ref 39.0–52.0)
HCT: 49.2 % (ref 39.0–52.0)
HEMOGLOBIN: 12.9 g/dL — AB (ref 13.0–17.0)
Hemoglobin: 12.5 g/dL — ABNORMAL LOW (ref 13.0–17.0)
Hemoglobin: 16 g/dL (ref 13.0–17.0)
MCH: 27.9 pg (ref 26.0–34.0)
MCH: 27.9 pg (ref 26.0–34.0)
MCH: 28 pg (ref 26.0–34.0)
MCHC: 32.5 g/dL (ref 30.0–36.0)
MCHC: 32.6 g/dL (ref 30.0–36.0)
MCHC: 32.8 g/dL (ref 30.0–36.0)
MCV: 85.7 fL (ref 78.0–100.0)
MCV: 84.9 fL (ref 78.0–100.0)
MCV: 85.9 fL (ref 78.0–100.0)
PLATELETS: 115 10*3/uL — AB (ref 150–400)
PLATELETS: 84 10*3/uL — AB (ref 150–400)
Platelets: 127 10*3/uL — ABNORMAL LOW (ref 150–400)
RBC: 4.46 MIL/uL (ref 4.22–5.81)
RBC: 4.63 MIL/uL (ref 4.22–5.81)
RBC: 5.74 MIL/uL (ref 4.22–5.81)
RDW: 15 % (ref 11.5–15.5)
RDW: 15.4 % (ref 11.5–15.5)
RDW: 15 % (ref 11.5–15.5)
WBC: 3.7 10*3/uL — ABNORMAL LOW (ref 4.0–10.5)
WBC: 5.8 10*3/uL (ref 4.0–10.5)
WBC: 7.1 10*3/uL (ref 4.0–10.5)

## 2013-05-01 LAB — MAGNESIUM
Magnesium: 2.3 mg/dL (ref 1.5–2.5)
Magnesium: 2.3 mg/dL (ref 1.5–2.5)
Magnesium: 2 mg/dL (ref 1.5–2.5)

## 2013-05-01 LAB — LACTIC ACID, PLASMA
LACTIC ACID, VENOUS: 0.9 mmol/L (ref 0.5–2.2)
Lactic Acid, Venous: 2.8 mmol/L — ABNORMAL HIGH (ref 0.5–2.2)
Lactic Acid, Venous: 1.9 mmol/L (ref 0.5–2.2)
Lactic Acid, Venous: 1.1 mmol/L (ref 0.5–2.2)

## 2013-05-01 LAB — COMPREHENSIVE METABOLIC PANEL
ALT: 13 U/L (ref 0–53)
AST: 14 U/L (ref 0–37)
Albumin: 2.8 g/dL — ABNORMAL LOW (ref 3.5–5.2)
Alkaline Phosphatase: 76 U/L (ref 39–117)
BUN: 18 mg/dL (ref 6–23)
CALCIUM: 7.7 mg/dL — AB (ref 8.4–10.5)
CO2: 21 mEq/L (ref 19–32)
CREATININE: 1.44 mg/dL — AB (ref 0.50–1.35)
Chloride: 118 mEq/L — ABNORMAL HIGH (ref 96–112)
GFR calc non Af Amer: 52 mL/min — ABNORMAL LOW (ref >90)
GFR, EST AFRICAN AMERICAN: 60 mL/min — AB (ref >90)
Glucose, Bld: 138 mg/dL — ABNORMAL HIGH (ref 70–99)
Potassium: 3.9 mEq/L (ref 3.7–5.3)
Sodium: 151 mEq/L — ABNORMAL HIGH (ref 137–147)
TOTAL PROTEIN: 7 g/dL (ref 6.0–8.3)
Total Bilirubin: 0.4 mg/dL (ref 0.3–1.2)

## 2013-05-01 LAB — POCT I-STAT 3, VENOUS BLOOD GAS (G3P V)
Acid-base deficit: 1 mmol/L (ref 0.0–2.0)
Acid-base deficit: 4 mmol/L — ABNORMAL HIGH (ref 0.0–2.0)
BICARBONATE: 27 meq/L — AB (ref 20.0–24.0)
Bicarbonate: 22.6 mEq/L (ref 20.0–24.0)
O2 SAT: 50 %
O2 Saturation: 54 %
PCO2 VEN: 47.2 mmHg (ref 45.0–50.0)
PH VEN: 7.288 (ref 7.250–7.300)
PO2 VEN: 32 mmHg (ref 30.0–45.0)
Patient temperature: 98.5
TCO2: 24 mmol/L (ref 0–100)
TCO2: 29 mmol/L (ref 0–100)
pCO2, Ven: 57.4 mmHg — ABNORMAL HIGH (ref 45.0–50.0)
pH, Ven: 7.278 (ref 7.250–7.300)
pO2, Ven: 30 mmHg (ref 30.0–45.0)

## 2013-05-01 LAB — RAPID URINE DRUG SCREEN, HOSP PERFORMED
AMPHETAMINES: NOT DETECTED
BARBITURATES: NOT DETECTED
BENZODIAZEPINES: NOT DETECTED
Cocaine: NOT DETECTED
Opiates: NOT DETECTED
Tetrahydrocannabinol: NOT DETECTED

## 2013-05-01 LAB — AMMONIA: Ammonia: 30 umol/L (ref 11–60)

## 2013-05-01 LAB — PHOSPHORUS
Phosphorus: 3.7 mg/dL (ref 2.3–4.6)
Phosphorus: 2.9 mg/dL (ref 2.3–4.6)
Phosphorus: 3.5 mg/dL (ref 2.3–4.6)

## 2013-05-01 LAB — BASIC METABOLIC PANEL
BUN: 21 mg/dL (ref 6–23)
BUN: 19 mg/dL (ref 6–23)
BUN: 18 mg/dL (ref 6–23)
BUN: 14 mg/dL (ref 6–23)
BUN: 16 mg/dL (ref 6–23)
CALCIUM: 7.5 mg/dL — AB (ref 8.4–10.5)
CALCIUM: 7.6 mg/dL — AB (ref 8.4–10.5)
CALCIUM: 7.7 mg/dL — AB (ref 8.4–10.5)
CALCIUM: 8.7 mg/dL (ref 8.4–10.5)
CHLORIDE: 118 meq/L — AB (ref 96–112)
CO2: 21 mEq/L (ref 19–32)
CO2: 21 mEq/L (ref 19–32)
CO2: 21 mEq/L (ref 19–32)
CO2: 20 mEq/L (ref 19–32)
CO2: 19 mEq/L (ref 19–32)
CREATININE: 1.12 mg/dL (ref 0.50–1.35)
CREATININE: 1.11 mg/dL (ref 0.50–1.35)
Calcium: 8.4 mg/dL (ref 8.4–10.5)
Chloride: 97 mEq/L (ref 96–112)
Chloride: 100 mEq/L (ref 96–112)
Chloride: 113 mEq/L — ABNORMAL HIGH (ref 96–112)
Chloride: 116 mEq/L — ABNORMAL HIGH (ref 96–112)
Creatinine, Ser: 1.04 mg/dL (ref 0.50–1.35)
Creatinine, Ser: 1.44 mg/dL — ABNORMAL HIGH (ref 0.50–1.35)
Creatinine, Ser: 1.09 mg/dL (ref 0.50–1.35)
GFR calc Af Amer: 83 mL/min — ABNORMAL LOW (ref >90)
GFR calc Af Amer: 60 mL/min — ABNORMAL LOW (ref >90)
GFR calc Af Amer: 85 mL/min — ABNORMAL LOW (ref >90)
GFR calc Af Amer: 82 mL/min — ABNORMAL LOW (ref >90)
GFR calc non Af Amer: 52 mL/min — ABNORMAL LOW (ref >90)
GFR calc non Af Amer: 71 mL/min — ABNORMAL LOW (ref >90)
GFR, EST AFRICAN AMERICAN: 90 mL/min — AB (ref >90)
GFR, EST NON AFRICAN AMERICAN: 73 mL/min — AB (ref >90)
GFR, EST NON AFRICAN AMERICAN: 71 mL/min — AB (ref >90)
GFR, EST NON AFRICAN AMERICAN: 77 mL/min — AB (ref >90)
GLUCOSE: 803 mg/dL — AB (ref 70–99)
Glucose, Bld: 679 mg/dL (ref 70–99)
Glucose, Bld: 137 mg/dL — ABNORMAL HIGH (ref 70–99)
Glucose, Bld: 155 mg/dL — ABNORMAL HIGH (ref 70–99)
Glucose, Bld: 237 mg/dL — ABNORMAL HIGH (ref 70–99)
POTASSIUM: 4.5 meq/L (ref 3.7–5.3)
Potassium: 4.4 mEq/L (ref 3.7–5.3)
Potassium: 4.1 mEq/L (ref 3.7–5.3)
Potassium: 3.2 mEq/L — ABNORMAL LOW (ref 3.7–5.3)
Potassium: 4 mEq/L (ref 3.7–5.3)
SODIUM: 136 meq/L — AB (ref 137–147)
Sodium: 138 mEq/L (ref 137–147)
Sodium: 151 mEq/L — ABNORMAL HIGH (ref 137–147)
Sodium: 144 mEq/L (ref 137–147)
Sodium: 147 mEq/L (ref 137–147)

## 2013-05-01 LAB — TROPONIN I
TROPONIN I: 0.38 ng/mL — AB (ref <0.30)
TROPONIN I: 0.38 ng/mL — AB (ref <0.30)
TROPONIN I: 0.31 ng/mL — AB (ref <0.30)
Troponin I: 0.45 ng/mL (ref <0.30)

## 2013-05-01 LAB — HEMOGLOBIN A1C
HEMOGLOBIN A1C: 12.5 % — AB (ref <5.7)
Mean Plasma Glucose: 312 mg/dL — ABNORMAL HIGH (ref <117)

## 2013-05-01 LAB — ETHANOL: Alcohol, Ethyl (B): <11 mg/dL (ref 0–11)

## 2013-05-01 LAB — LIPASE, BLOOD: LIPASE: 15 U/L (ref 11–59)

## 2013-05-01 LAB — HEPATITIS B SURFACE ANTIGEN: Hepatitis B Surface Ag: NEGATIVE

## 2013-05-01 LAB — HEPATITIS C ANTIBODY (REFLEX): HCV AB: NEGATIVE

## 2013-05-01 LAB — DIGOXIN LEVEL: DIGOXIN LVL: 0.4 ng/mL — AB (ref 0.8–2.0)

## 2013-05-01 LAB — AMYLASE: AMYLASE: 38 U/L (ref 0–105)

## 2013-05-01 LAB — MRSA PCR SCREENING: MRSA by PCR: NEGATIVE

## 2013-05-01 LAB — HEPARIN LEVEL (UNFRACTIONATED): HEPARIN UNFRACTIONATED: 0.45 [IU]/mL (ref 0.30–0.70)

## 2013-05-01 LAB — TRIGLYCERIDES: Triglycerides: 170 mg/dL — ABNORMAL HIGH (ref <150)

## 2013-05-01 MED ORDER — INSULIN ASPART 100 UNIT/ML ~~LOC~~ SOLN
3.0000 [IU] | SUBCUTANEOUS | Status: DC
  Administered 2013-05-02 – 2013-05-05 (×18): 3 [IU] via SUBCUTANEOUS

## 2013-05-01 MED ORDER — ASPIRIN 325 MG PO TABS
325.0000 mg | ORAL_TABLET | Freq: Every day | ORAL | Status: DC
  Administered 2013-05-01 – 2013-05-12 (×11): 325 mg via ORAL
  Filled 2013-05-01 (×13): qty 1

## 2013-05-01 MED ORDER — DILTIAZEM HCL 100 MG IV SOLR
5.0000 mg/h | INTRAVENOUS | Status: DC
  Filled 2013-05-01: qty 100

## 2013-05-01 MED ORDER — SODIUM CHLORIDE 0.9 % IV SOLN: 250.0000 mL | INTRAVENOUS | Status: DC | PRN

## 2013-05-01 MED ORDER — VITAL HIGH PROTEIN PO LIQD
1000.0000 mL | ORAL | Status: DC
  Administered 2013-05-01: 1000 mL
  Administered 2013-05-02: 06:00:00
  Filled 2013-05-01 (×3): qty 1000

## 2013-05-01 MED ORDER — DEXTROSE-NACL 5-0.45 % IV SOLN
INTRAVENOUS | Status: DC
  Administered 2013-05-01 (×2): via INTRAVENOUS

## 2013-05-01 MED ORDER — VANCOMYCIN HCL 10 G IV SOLR
1250.0000 mg | Freq: Three times a day (TID) | INTRAVENOUS | Status: DC
  Administered 2013-05-01: 1250 mg via INTRAVENOUS
  Filled 2013-05-01 (×3): qty 1250

## 2013-05-01 MED ORDER — SODIUM CHLORIDE 0.9 % IV BOLUS (SEPSIS): 1000.0000 mL | Freq: Once | INTRAVENOUS | Status: DC

## 2013-05-01 MED ORDER — VECURONIUM BROMIDE 10 MG IV SOLR
INTRAVENOUS | Status: AC
  Filled 2013-05-01: qty 10

## 2013-05-01 MED ORDER — MIDAZOLAM HCL 2 MG/2ML IJ SOLN
2.0000 mg | Freq: Once | INTRAMUSCULAR | Status: AC
  Administered 2013-05-01 (×2): 2 mg via INTRAVENOUS

## 2013-05-01 MED ORDER — SODIUM CHLORIDE 0.9 % IV SOLN: INTRAVENOUS | Status: DC

## 2013-05-01 MED ORDER — MIDAZOLAM HCL 2 MG/2ML IJ SOLN: 2.0000 mg | Freq: Once | INTRAMUSCULAR | Status: DC

## 2013-05-01 MED ORDER — ROCURONIUM BROMIDE 50 MG/5ML IV SOLN
50.0000 mg | Freq: Once | INTRAVENOUS | Status: AC
  Administered 2013-05-01: 50 mg via INTRAVENOUS

## 2013-05-01 MED ORDER — DEXMEDETOMIDINE HCL IN NACL 200 MCG/50ML IV SOLN
0.4000 ug/kg/h | INTRAVENOUS | Status: DC
  Administered 2013-05-01: 0.4 ug/kg/h via INTRAVENOUS
  Filled 2013-05-01 (×2): qty 50

## 2013-05-01 MED ORDER — CHLORHEXIDINE GLUCONATE 0.12 % MT SOLN
15.0000 mL | Freq: Two times a day (BID) | OROMUCOSAL | Status: DC
  Administered 2013-05-01 – 2013-05-05 (×8): 15 mL via OROMUCOSAL
  Filled 2013-05-01 (×7): qty 15

## 2013-05-01 MED ORDER — FENTANYL CITRATE 0.05 MG/ML IJ SOLN
INTRAMUSCULAR | Status: AC
  Filled 2013-05-01: qty 4

## 2013-05-01 MED ORDER — DEXTROSE 10 % IV SOLN: INTRAVENOUS | Status: DC | PRN

## 2013-05-01 MED ORDER — INSULIN ASPART 100 UNIT/ML ~~LOC~~ SOLN
20.0000 [IU] | Freq: Once | SUBCUTANEOUS | Status: AC
  Administered 2013-05-01: 20 [IU] via SUBCUTANEOUS

## 2013-05-01 MED ORDER — PROPOFOL 10 MG/ML IV EMUL
INTRAVENOUS | Status: AC
  Filled 2013-05-01: qty 100

## 2013-05-01 MED ORDER — FENTANYL CITRATE 0.05 MG/ML IJ SOLN
100.0000 ug | Freq: Once | INTRAMUSCULAR | Status: AC
  Administered 2013-05-01: 100 ug via INTRAVENOUS

## 2013-05-01 MED ORDER — POTASSIUM CHLORIDE 20 MEQ/15ML (10%) PO LIQD
40.0000 meq | Freq: Every day | ORAL | Status: DC
  Administered 2013-05-01 – 2013-05-02 (×2): 40 meq via ORAL
  Filled 2013-05-01 (×2): qty 30

## 2013-05-01 MED ORDER — PROPOFOL 10 MG/ML IV EMUL
5.0000 ug/kg/min | INTRAVENOUS | Status: DC
  Administered 2013-05-01: 50 ug/kg/min via INTRAVENOUS
  Administered 2013-05-01: 45 ug/kg/min via INTRAVENOUS
  Administered 2013-05-01 (×2): 50 ug/kg/min via INTRAVENOUS
  Administered 2013-05-01: 10 ug/kg/min via INTRAVENOUS
  Administered 2013-05-02: 55 ug/kg/min via INTRAVENOUS
  Administered 2013-05-02: 60 ug/kg/min via INTRAVENOUS
  Administered 2013-05-02: 55 ug/kg/min via INTRAVENOUS
  Administered 2013-05-02 (×3): 60 ug/kg/min via INTRAVENOUS
  Filled 2013-05-01: qty 300
  Filled 2013-05-01: qty 100
  Filled 2013-05-01: qty 200
  Filled 2013-05-01 (×2): qty 100
  Filled 2013-05-01: qty 200

## 2013-05-01 MED ORDER — POTASSIUM CHLORIDE 10 MEQ/100ML IV SOLN: 10.0000 meq | INTRAVENOUS | Status: DC

## 2013-05-01 MED ORDER — SODIUM CHLORIDE 0.9 % IV SOLN
Freq: Once | INTRAVENOUS | Status: AC
Start: 2013-05-01 — End: 2013-05-01
  Administered 2013-05-01 (×2): 1000 mL via INTRAVENOUS

## 2013-05-01 MED ORDER — IPRATROPIUM-ALBUTEROL 0.5-2.5 (3) MG/3ML IN SOLN
3.0000 mL | RESPIRATORY_TRACT | Status: DC | PRN
  Filled 2013-05-01: qty 3

## 2013-05-01 MED ORDER — HEPARIN SODIUM (PORCINE) 5000 UNIT/ML IJ SOLN
5000.0000 [IU] | Freq: Three times a day (TID) | INTRAMUSCULAR | Status: DC
  Administered 2013-05-01: 5000 [IU] via SUBCUTANEOUS
  Filled 2013-05-01 (×4): qty 1

## 2013-05-01 MED ORDER — ETOMIDATE 2 MG/ML IV SOLN
20.0000 mg | Freq: Once | INTRAVENOUS | Status: AC
  Administered 2013-05-01: 20 mg via INTRAVENOUS

## 2013-05-01 MED ORDER — PIPERACILLIN-TAZOBACTAM 3.375 G IVPB
3.3750 g | Freq: Three times a day (TID) | INTRAVENOUS | Status: DC
  Administered 2013-05-01 – 2013-05-08 (×22): 3.375 g via INTRAVENOUS
  Filled 2013-05-01 (×24): qty 50

## 2013-05-01 MED ORDER — MIDAZOLAM HCL 2 MG/2ML IJ SOLN
INTRAMUSCULAR | Status: AC
  Administered 2013-05-01: 2 mg via INTRAVENOUS
  Filled 2013-05-01: qty 4

## 2013-05-01 MED ORDER — PRO-STAT SUGAR FREE PO LIQD
60.0000 mL | Freq: Four times a day (QID) | ORAL | Status: DC
  Administered 2013-05-01 – 2013-05-02 (×4): 60 mL
  Filled 2013-05-01 (×6): qty 60

## 2013-05-01 MED ORDER — VITAL HIGH PROTEIN PO LIQD
1000.0000 mL | ORAL | Status: DC
  Filled 2013-05-01 (×2): qty 1000

## 2013-05-01 MED ORDER — MIDAZOLAM HCL 2 MG/2ML IJ SOLN
2.0000 mg | INTRAMUSCULAR | Status: DC | PRN
  Administered 2013-05-01 – 2013-05-02 (×6): 2 mg via INTRAVENOUS
  Filled 2013-05-01 (×3): qty 2

## 2013-05-01 MED ORDER — INSULIN ASPART 100 UNIT/ML ~~LOC~~ SOLN
0.0000 [IU] | SUBCUTANEOUS | Status: DC
  Administered 2013-05-01: 3 [IU] via SUBCUTANEOUS
  Administered 2013-05-02: 5 [IU] via SUBCUTANEOUS
  Administered 2013-05-02 (×2): 8 [IU] via SUBCUTANEOUS
  Administered 2013-05-02: 15 [IU] via SUBCUTANEOUS
  Administered 2013-05-02 (×2): 8 [IU] via SUBCUTANEOUS
  Administered 2013-05-03 (×2): 5 [IU] via SUBCUTANEOUS
  Administered 2013-05-03: 8 [IU] via SUBCUTANEOUS
  Administered 2013-05-03: 11 [IU] via SUBCUTANEOUS
  Administered 2013-05-03: 2 [IU] via SUBCUTANEOUS
  Administered 2013-05-03: 5 [IU] via SUBCUTANEOUS
  Administered 2013-05-04: 3 [IU] via SUBCUTANEOUS
  Administered 2013-05-04 (×2): 11 [IU] via SUBCUTANEOUS
  Administered 2013-05-04: 8 [IU] via SUBCUTANEOUS
  Administered 2013-05-04: 7 [IU] via SUBCUTANEOUS
  Administered 2013-05-04 – 2013-05-05 (×2): 8 [IU] via SUBCUTANEOUS
  Administered 2013-05-05: 3 [IU] via SUBCUTANEOUS
  Administered 2013-05-05: 11 [IU] via SUBCUTANEOUS
  Administered 2013-05-05 (×2): 5 [IU] via SUBCUTANEOUS
  Administered 2013-05-05: 15 [IU] via SUBCUTANEOUS
  Administered 2013-05-06: 8 [IU] via SUBCUTANEOUS
  Administered 2013-05-06: 2 [IU] via SUBCUTANEOUS
  Administered 2013-05-06 (×2): 5 [IU] via SUBCUTANEOUS
  Administered 2013-05-06: 8 [IU] via SUBCUTANEOUS
  Administered 2013-05-06 – 2013-05-07 (×3): 2 [IU] via SUBCUTANEOUS
  Administered 2013-05-07: 5 [IU] via SUBCUTANEOUS
  Administered 2013-05-07: 2 [IU] via SUBCUTANEOUS
  Administered 2013-05-07: 3 [IU] via SUBCUTANEOUS
  Administered 2013-05-07: 5 [IU] via SUBCUTANEOUS
  Administered 2013-05-08: 3 [IU] via SUBCUTANEOUS
  Administered 2013-05-08: 2 [IU] via SUBCUTANEOUS
  Administered 2013-05-08: 3 [IU] via SUBCUTANEOUS

## 2013-05-01 MED ORDER — SODIUM CHLORIDE 0.9 % IV BOLUS (SEPSIS)
1000.0000 mL | Freq: Once | INTRAVENOUS | Status: AC
  Administered 2013-05-01: 1000 mL via INTRAVENOUS

## 2013-05-01 MED ORDER — THIAMINE HCL 100 MG/ML IJ SOLN
Freq: Once | INTRAVENOUS | Status: AC
  Administered 2013-05-02: 08:00:00 via INTRAVENOUS
  Filled 2013-05-01: qty 1000

## 2013-05-01 MED ORDER — PANTOPRAZOLE SODIUM 40 MG IV SOLR
40.0000 mg | Freq: Every day | INTRAVENOUS | Status: DC
  Administered 2013-05-01: 40 mg via INTRAVENOUS
  Filled 2013-05-01 (×2): qty 40

## 2013-05-01 MED ORDER — FAMOTIDINE IN NACL 20-0.9 MG/50ML-% IV SOLN
20.0000 mg | Freq: Two times a day (BID) | INTRAVENOUS | Status: DC
  Administered 2013-05-01: 20 mg via INTRAVENOUS
  Filled 2013-05-01 (×2): qty 50

## 2013-05-01 MED ORDER — IOHEXOL 350 MG/ML SOLN
100.0000 mL | Freq: Once | INTRAVENOUS | Status: AC | PRN
  Administered 2013-05-01: 100 mL via INTRAVENOUS

## 2013-05-01 MED ORDER — PRO-STAT SUGAR FREE PO LIQD
30.0000 mL | Freq: Two times a day (BID) | ORAL | Status: DC
  Filled 2013-05-01 (×2): qty 30

## 2013-05-01 MED ORDER — HEPARIN (PORCINE) IN NACL 100-0.45 UNIT/ML-% IJ SOLN
1550.0000 [IU]/h | INTRAMUSCULAR | Status: DC
  Administered 2013-05-01: 1550 [IU]/h via INTRAVENOUS
  Filled 2013-05-01 (×3): qty 250

## 2013-05-01 MED ORDER — VANCOMYCIN HCL 10 G IV SOLR
1500.0000 mg | Freq: Two times a day (BID) | INTRAVENOUS | Status: DC
  Administered 2013-05-01 – 2013-05-04 (×6): 1500 mg via INTRAVENOUS
  Filled 2013-05-01 (×6): qty 1500

## 2013-05-01 MED ORDER — THIAMINE HCL 100 MG/ML IJ SOLN
Freq: Once | INTRAVENOUS | Status: AC
  Administered 2013-05-01: 04:00:00 via INTRAVENOUS
  Filled 2013-05-01: qty 1000

## 2013-05-01 MED ORDER — MIDAZOLAM HCL 2 MG/2ML IJ SOLN: 2.0000 mg | Freq: Once | INTRAMUSCULAR | Status: AC

## 2013-05-01 MED ORDER — THIAMINE HCL 100 MG/ML IJ SOLN
Freq: Once | INTRAVENOUS | Status: DC
  Filled 2013-05-01: qty 1000

## 2013-05-01 MED ORDER — IOHEXOL 300 MG/ML  SOLN
25.0000 mL | INTRAMUSCULAR | Status: AC
  Administered 2013-05-01 (×2): 25 mL via ORAL

## 2013-05-01 MED ORDER — ETOMIDATE 2 MG/ML IV SOLN
INTRAVENOUS | Status: AC
  Filled 2013-05-01: qty 20

## 2013-05-01 MED ORDER — BIOTENE DRY MOUTH MT LIQD
15.0000 mL | Freq: Four times a day (QID) | OROMUCOSAL | Status: DC
  Administered 2013-05-02 – 2013-05-07 (×22): 15 mL via OROMUCOSAL

## 2013-05-01 MED ORDER — ACETAMINOPHEN 650 MG RE SUPP
650.0000 mg | Freq: Once | RECTAL | Status: AC
  Administered 2013-05-01: 650 mg via RECTAL

## 2013-05-01 MED ORDER — MIDAZOLAM HCL 2 MG/2ML IJ SOLN
INTRAMUSCULAR | Status: AC
  Filled 2013-05-01: qty 2

## 2013-05-01 MED ORDER — SODIUM CHLORIDE 0.9 % IV SOLN
INTRAVENOUS | Status: DC
  Administered 2013-05-01 (×2): via INTRAVENOUS

## 2013-05-01 MED ORDER — VITAL HIGH PROTEIN PO LIQD
1000.0000 mL | ORAL | Status: DC
  Filled 2013-05-01: qty 1000

## 2013-05-01 NOTE — Procedures (Signed)
Intubation Procedure Note Don Brown [id number] [birth year]  Procedure: Intubation Indications: Respiratory insufficiency  Procedure Details Consent: Unable to obtain consent because of altered level of consciousness. Time Out: Verified patient identification, verified procedure, site/side was marked, verified correct patient position, special equipment/implants available, medications/allergies/relevent history reviewed, required imaging and test results available.  Performed  MAC and 4   Evaluation Hemodynamic Status: BP stable throughout; O2 sats: stable throughout Patient's Current Condition: stable Complications: No apparent complications Patient did tolerate procedure well. Chest X-ray ordered to verify placement.  CXR: pending.   Howard Pouch DO PGY-2 05/01/2013   I was present and supervised the entire procedure.  Patient seen and examined, agree with above note.  I dictated the care and orders written for this patient under my direction.  Rush Farmer, MD [phone]

## 2013-05-01 NOTE — ED Notes (Signed)
Patient was source for blood exposure to [facility] staff member.  Per hospital policy blood was drawn for exposure panel.

## 2013-05-01 NOTE — Progress Notes (Signed)
Utilization review completed.  

## 2013-05-01 NOTE — Progress Notes (Addendum)
ANTIBIOTIC CONSULT NOTE - INITIAL  Pharmacy Consult for Vancomycin Indication: fevers, r/o meningitis  Allergies  Allergen Reactions  . Morphine And Related Nausea And Vomiting    Patient Measurements: Height: 6' 0.05" (183 cm) Weight: 330 lb 11 oz (150 kg) (12/2012) IBW/kg (Calculated) : 77.71 Adjusted Body Weight: 105 kg  Vital Signs: Temp: 102 F (38.9 C) (02/09 0015) Temp src: Rectal (02/08 2117) BP: 179/120 mmHg (02/09 0015) Pulse Rate: 133 (02/09 0015)  Labs:  Recent Labs  04/30/13 2127 04/30/13 2141 04/30/13 2337  WBC 5.1  --   --   HGB 15.4 18.4*  --   PLT 123*  --   --   CREATININE 1.26 1.40* 1.11   Estimated Creatinine Clearance: 109.4 ml/min (by C-G formula based on Cr of 1.11). No results found for this basename: VANCOTROUGH, VANCOPEAK, VANCORANDOM, GENTTROUGH, GENTPEAK, GENTRANDOM, TOBRATROUGH, TOBRAPEAK, TOBRARND, AMIKACINPEAK, AMIKACINTROU, AMIKACIN,  in the last 72 hours   Microbiology: No results found for this or any previous visit (from the past 720 hour(s)).  Medical History: Past Medical History  Diagnosis Date  . 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 Atrial fibrillation   . CHF (congestive heart failure)     Chronic systolic heart failure with an ejection fraction of 40%- 45%  . Morbid obesity   . Chronic pain   . Narcotic dependence, episodic use     chronic narcotic use  . Respiratory failure     History of ventilatory-dependent respiratory failure and tracheostomy   . History of renal failure     requiring hemodialysis in the past with normal renal function at this point  . History of ventral hernia repair   . Cervical compression fracture     Cervical disk disease requiring surgery  . Hypertension   . Hyperlipidemia   . Degenerative joint disease   . Heart murmur     Medications:   Pancrease  ASA  Digoxin  Lasix  Vicodin  Advil  Zestril  Toprol XL   OxyIR  Assessment: 58 yo male with AMS/fever, possible intraabdominal pain or meningitis, for empiric antibiotics.  Vancomycin 2 g IV given in ED at midnight  Goal of Therapy:  Vancomycin trough level 15-20 mcg/ml  Plan:  Vancomycin 1250 mg IV q8h  Abbott, Bronson Curb 05/01/2013,1:32 AM    ==============================   Addendum: - patient's SCr changed from 1.04 to 1.44 within several hours - UOP decreasing per RN   Plan: - Change vanc to 1500mg  IV Q12H - Continue Zosyn as ordered - Monitor renal fxn and adjust abx and check vanc level as appropriate    Vi Whitesel D. Mina Marble, PharmD, BCPS Pager:  [phone] 05/01/2013, 8:20 AM

## 2013-05-01 NOTE — Progress Notes (Signed)
Inpatient Diabetes Program Recommendations  AACE/ADA: New Consensus Statement on Inpatient Glycemic Control (2013)  Target Ranges:  Prepandial:   less than 140 mg/dL      Peak postprandial:   less than 180 mg/dL (1-2 hours)      Critically ill patients:  140 - 180 mg/dL   Reason for Visit: Results for BACILIO, ABASCAL (MRN [id number]) as of 05/01/2013 13:55  Ref. Range 04/30/2013 23:39 05/01/2013 01:05 05/01/2013 02:24 05/01/2013 04:08 05/01/2013 05:39  Glucose-Capillary Latest Range: 70-99 mg/dL >600 (HH) >600 (HH) 347 (H) 187 (H) 143 (H)    Diabetes history: No history of diabetes noted. A1C pending. Current orders for Inpatient glycemic control: IV insulin.  CBG's improved.  Note that patient intubated.  May consider ICU glycemic control order set for transition off insulin drip once transition criteria met.   Thanks, Adah Perl, RN, BC-ADM Inpatient Diabetes Coordinator Pager [phone]

## 2013-05-01 NOTE — ED Notes (Signed)
Dr. Aline Brochure aware of BS [phone]

## 2013-05-01 NOTE — Progress Notes (Signed)
INITIAL NUTRITION ASSESSMENT  DOCUMENTATION CODES Per approved criteria  -Morbid Obesity   INTERVENTION:  Utilize 71M PEPuP Protocol: initiate TF via OGT with Vital High Protein at 10 ml/h and Prostat 60 ml QID on day 1; on day 2, continue at goal rate of 10 ml/h (240 ml per day) with Prostat 60 ml QID to provide 1040 kcals, 141 gm protein, 201 ml free water daily.  Above TF regimen plus current Propofol rate will provide a total of 2062 kcals per day (25 kcals/kg ideal weight).  NUTRITION DIAGNOSIS: Inadequate oral intake related to inability to eat as evidenced by NPO status.   Goal: Enteral nutrition to provide 60-70% of estimated calorie needs (22-25 kcals/kg ideal body weight) and 100% of estimated protein needs, based on ASPEN guidelines for permissive underfeeding in critically ill obese individuals.  Monitor:  TF tolerance/adequacy, weight trend, labs, vent status.  Reason for Assessment: MD Consult for TF initiation and management.  58 y.o. male  Admitting Dx: Hyperosmolar non-ketotic state in patient with type 2 diabetes mellitus  ASSESSMENT: 58 yo M with known chronic pancreatitis, AFib, HF, obesity, HTN, HLD, coming in with acute agitation and fever, has HHS + elevated lactate. Concern for possible DT vs intraabdominal process (pancreatitis vs infection vs bowel ischemia).   Patient is currently intubated on ventilator support.  MV: 7.1 L/min Temp (24hrs), Avg:100.6 F (38.1 C), Min:98.2 F (36.8 C), Max:102.6 F (39.2 C)  Propofol: 38.7 ml/hr providing 1022 kcals/day.  Nutrition focused physical exam completed.  No muscle or subcutaneous fat depletion noticed.  Height: Ht Readings from Last 1 Encounters:  05/01/13 6' 0.05" (1.83 m)    Weight: Wt Readings from Last 1 Encounters:  05/01/13 316 lb 2.2 oz (143.4 kg)    Ideal Body Weight: 80.9 kg  % Ideal Body Weight: 177%  Wt Readings from Last 10 Encounters:  05/01/13 316 lb 2.2 oz (143.4 kg)   12/29/12 331 lb 6 oz (150.311 kg)  07/14/12 317 lb 6.4 oz (143.972 kg)  07/30/11 270 lb 8 oz (122.698 kg)  06/30/11 266 lb 3.2 oz (120.748 kg)  04/17/11 258 lb 6.4 oz (117.209 kg)  01/14/11 252 lb (114.306 kg)    Usual Body Weight: 331 lb  % Usual Body Weight: 95%  BMI:  Body mass index is 42.82 kg/(m^2). class 3, extreme/morbid obesity  Estimated Nutritional Needs: Kcal: 2760 Protein: 165-200 gm Fluid: 2.6-2.8 L  Skin: no wounds  Diet Order: NPO  EDUCATION NEEDS: -Education not appropriate at this time   Intake/Output Summary (Last 24 hours) at 05/01/13 1413 Last data filed at 05/01/13 1335  Gross per 24 hour  Intake 4590.8 ml  Output   1355 ml  Net 3235.8 ml    Last BM: None documented since admission    Labs:   Recent Labs Lab 04/30/13 2337 05/01/13 0131 05/01/13 0304 05/01/13 0504  NA 136* 138 151* 151*  K 4.4 4.5 4.1 3.9  CL 97 100 118* 118*  CO2 21 21 21 21   BUN 21 19 18 18   CREATININE 1.11 1.04 1.44* 1.44*  CALCIUM 8.7 8.4 7.7* 7.7*  MG  --  2.3  --  2.3  PHOS  --  3.7  --  2.9  GLUCOSE 803* 679* 137* 138*    CBG (last 3)   Recent Labs  05/01/13 0224 05/01/13 0408 05/01/13 0539  GLUCAP 347* 187* 143*    Scheduled Meds: . aspirin  325 mg Oral Daily  . feeding supplement (PRO-STAT SUGAR  FREE 64)  30 mL Per Tube BID  . feeding supplement (VITAL HIGH PROTEIN)  1,000 mL Per Tube Q24H  . midazolam  2 mg Intravenous Once  . midazolam  4 mg Intravenous Once  . pantoprazole (PROTONIX) IV  40 mg Intravenous QHS  . piperacillin-tazobactam (ZOSYN)  IV  3.375 g Intravenous Q8H  . [START ON 05/02/2013] banana bag IV 1000 mL   Intravenous Once  . sodium chloride  1,000 mL Intravenous Once  . vancomycin  1,500 mg Intravenous Q12H    Continuous Infusions: . sodium chloride 125 mL/hr at 05/01/13 0631  . dexmedetomidine 0.4 mcg/kg/hr (05/01/13 0113)  . dextrose 5 % and 0.45% NaCl 100 mL/hr at 05/01/13 0720  . diltiazem (CARDIZEM) infusion  Stopped (05/01/13 1335)  . heparin 1,550 Units/hr (05/01/13 1319)  . insulin (NOVOLIN-R) infusion 4.6 Units/hr (05/01/13 1335)  . propofol 45 mcg/kg/min (05/01/13 1343)    Past Medical History  Diagnosis Date  . 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 Atrial fibrillation   . CHF (congestive heart failure)     Chronic systolic heart failure with an ejection fraction of 40%- 45%  . Morbid obesity   . Chronic pain   . Narcotic dependence, episodic use     chronic narcotic use  . Respiratory failure     History of ventilatory-dependent respiratory failure and tracheostomy   . History of renal failure     requiring hemodialysis in the past with normal renal function at this point  . History of ventral hernia repair   . Cervical compression fracture     Cervical disk disease requiring surgery  . Hypertension   . Hyperlipidemia   . Degenerative joint disease   . Heart murmur     Past Surgical History  Procedure Laterality Date  . Ventral hernia repair    . Cholecystectomy    . Tracheostomy    . Pancreas surgery    . Pancreatic stents    . Skin graft    . Insertion of mesh      Molli Barrows, RD, LDN, [place] Pager [phone] After Hours Pager [phone]

## 2013-05-01 NOTE — ED Notes (Signed)
To CT then to floor

## 2013-05-01 NOTE — Progress Notes (Signed)
ANTICOAGULATION CONSULT NOTE - Initial Consult  Pharmacy Consult:  Heparin Indication:  Rule out ACS + Afib  Allergies  Allergen Reactions  . Morphine And Related Nausea And Vomiting    Patient Measurements: Height: 6' 0.05" (183 cm) Weight: 316 lb 2.2 oz (143.4 kg) IBW/kg (Calculated) : 77.71 Heparin Dosing Weight: 110 kg  Vital Signs: Temp: 98.4 F (36.9 C) (02/09 1100) Temp src: Core (Comment) (02/09 1130) BP: 223/117 mmHg (02/09 1143) Pulse Rate: 113 (02/09 1143)  Labs:  Recent Labs  04/30/13 2127 04/30/13 2130 04/30/13 2141 04/30/13 2337 05/01/13 0131 05/01/13 0304 05/01/13 0500 05/01/13 0504 05/01/13 0652 05/01/13 1100  HGB 15.4  --  18.4* 16.0  --   --   --   --  12.9*  --   HCT 47.7  --  54.0* 49.2  --   --   --   --  39.3  --   PLT 123*  --   --  127*  --   --   --   --  115*  --   CREATININE 1.26  --  1.40* 1.11 1.04 1.44*  --  1.44*  --   --   TROPONINI  --  <0.30  --   --   --   --  0.45*  --   --  0.38*    Estimated Creatinine Clearance: 82.3 ml/min (by C-G formula based on Cr of 1.44).   Medical History: Past Medical History  Diagnosis Date  . 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 Atrial fibrillation   . CHF (congestive heart failure)     Chronic systolic heart failure with an ejection fraction of 40%- 45%  . Morbid obesity   . Chronic pain   . Narcotic dependence, episodic use     chronic narcotic use  . Respiratory failure     History of ventilatory-dependent respiratory failure and tracheostomy   . History of renal failure     requiring hemodialysis in the past with normal renal function at this point  . History of ventral hernia repair   . Cervical compression fracture     Cervical disk disease requiring surgery  . Hypertension   . Hyperlipidemia   . Degenerative joint disease   . Heart murmur        Assessment: 57 YOM with Afib (CHADS2 = 2) and positive troponin to start IV heparin.  Patient received SQ heparin this AM and his platelets is low at 115.   Goal of Therapy:  Heparin level 0.3-0.7 units/ml Monitor platelets by anticoagulation protocol: Yes    Plan:  - D/C SQ heparin - Heparin gtt at 1550 units/hr - Check 6 hr HL - Daily HL / CBC - Vanc 1500mg  IV Q12H and Zosyn 3.375gm IV Q8H, 4 hr infusion    Manette Doto D. Mina Marble, PharmD, BCPS Pager:  [phone] 05/01/2013, 1:34 PM

## 2013-05-01 NOTE — H&P (Signed)
Name: Don Brown MRN: [id number] DOB: May 07, 1955    ADMISSION DATE:  04/30/2013  REFERRING MD :  ED PRIMARY SERVICE: PCCM  CHIEF COMPLAINT:  Altered mental status  BRIEF PATIENT DESCRIPTION: 62 M with known chronic pancreatitis, AFib, HF, obesity, HTN, HLD, coming in with acute agitation and fever, has HHS + elevated lactate. Concern for possible DT vs intraabdominal process (pancreatitis vs infection vs bowel ischemia). Cannot rule out meningitis but less likely (no headache, no nuchal rigidity)  SIGNIFICANT EVENTS / STUDIES:  CT head 04/30/13 negative for acute process  LINES / TUBES: Foley 04/30/13 >>>  CULTURES: Pending  ANTIBIOTICS: Zosyn 05/01/13 >>> Vancomycin 05/01/13 >>>  HISTORY OF PRESENT ILLNESS:  Patient currently unable to give history, history taken from daughters and chart review. According to his daughters, for the past several days he has been drinking a lot of V8 juice and having to urinate a lot. Tonight he was at home with a family member and went into the BR and fell in the floor and brought to ED. They denied any recent EtOH or drug abuse, in fact according to the the patient has been trying to reduce/wean off  the amount of pain medication he was taking. Has fever 102 in ED, very high blood sugar above 900, lactic acidosis, without adequate respiratory compensation. Patient initially responded well to versed and haldol then got agitated again. He was very delirious. No report of any sick contact, recent travel, or any unusual substance taken.   PAST MEDICAL HISTORY :  Past Medical History  Diagnosis Date  . 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 Atrial fibrillation   . CHF (congestive heart failure)     Chronic systolic heart failure with an ejection fraction of 40%- 45%  . Morbid obesity   . Chronic pain   . Narcotic  dependence, episodic use     chronic narcotic use  . Respiratory failure     History of ventilatory-dependent respiratory failure and tracheostomy   . History of renal failure     requiring hemodialysis in the past with normal renal function at this point  . History of ventral hernia repair   . Cervical compression fracture     Cervical disk disease requiring surgery  . Hypertension   . Hyperlipidemia   . Degenerative joint disease   . Heart murmur    Past Surgical History  Procedure Laterality Date  . Ventral hernia repair    . Cholecystectomy    . Tracheostomy    . Pancreas surgery    . Pancreatic stents    . Skin graft    . Insertion of mesh     Prior to Admission medications   Medication Sig Start Date End Date Taking? Authorizing Provider  Amylase-Lipase-Protease (PANCREASE PO) Take 2 capsules by mouth 3 (three) times daily with meals.    Yes Historical Provider, MD  digoxin (LANOXIN) 0.125 MG tablet Take 125 mcg by mouth every morning.    Yes Historical Provider, MD  furosemide (LASIX) 40 MG tablet Take 40 mg by mouth daily as needed. Fluid 01/14/11  Yes Peter M Martinique, MD  metoprolol (TOPROL-XL) 50 MG 24 hr tablet Take 50 mg by mouth every morning.    Yes Historical Provider, MD  oxyCODONE (OXY IR/ROXICODONE) 5 MG immediate release tablet Take 10 mg by mouth every 6 (six) hours as  needed for pain.   Yes Historical Provider, MD  promethazine (PHENERGAN) 25 MG tablet Take 25 mg by mouth every 8 (eight) hours as needed for nausea or vomiting.   Yes Historical Provider, MD  aspirin 81 MG tablet Take 81 mg by mouth daily before breakfast.     Historical Provider, MD  Cyanocobalamin (VITAMIN B 12 PO) Take 2 tablets by mouth daily.     Historical Provider, MD  Nyoka Cowden Tea, Camillia sinensis, (GREEN TEA PO) Take 2 tablets by mouth daily.     Historical Provider, MD  HYDROcodone-acetaminophen (NORCO) 5-325 MG per tablet Take 1 tablet by mouth every 6 (six) hours as needed. Pain      Historical Provider, MD  ibuprofen (ADVIL,MOTRIN) 200 MG tablet Take 200 mg by mouth every 6 (six) hours as needed for pain.    Historical Provider, MD  lisinopril (PRINIVIL,ZESTRIL) 10 MG tablet Take 10 mg by mouth daily.     Historical Provider, MD   Allergies  Allergen Reactions  . Morphine And Related Nausea And Vomiting    FAMILY HISTORY:  Family History  Problem Relation Age of Onset  . Hypertension Mother   . Diabetes Mother   . Lymphoma Father    SOCIAL HISTORY:  reports that he has never smoked. He does not have any smokeless tobacco history on file. He reports that he does not drink alcohol or use illicit drugs.  REVIEW OF SYSTEMS:  Patient unable to provide due to extreme agitation and confusion  SUBJECTIVE:   VITAL SIGNS: Temp:  [98.6 F (37 C)-102 F (38.9 C)] 102 F (38.9 C) (02/09 0015) Pulse Rate:  [61-148] 133 (02/09 0015) Resp:  [14-36] 24 (02/09 0015) BP: (109-204)/(79-120) 179/120 mmHg (02/09 0015) SpO2:  [94 %-100 %] 94 % (02/09 0015) HEMODYNAMICS:   VENTILATOR SETTINGS:   INTAKE / OUTPUT: Intake/Output   None     PHYSICAL EXAMINATION: General:  Very agitated, awake, combative, not oriented at all, speaks inapproriate things Neuro:  No focal weakness, no facial droop, no dysarthria HEENT:  No enlarged LN, no stridor, EOM full and equal Cardiovascular:  Irregular, no loud murmur Lungs:  Slightly diminished air entry bilaterally, no wheeze, no rales Abdomen:  Soft, no tenderness, no guarding Musculoskeletal:  No leg edema, no clubbing Skin:  Has a large abdominal scar, no sacral ulcer  LABS:  CBC  Recent Labs Lab 04/30/13 2127 04/30/13 2141  WBC 5.1  --   HGB 15.4 18.4*  HCT 47.7 54.0*  PLT 123*  --    Coag's No results found for this basename: APTT, INR,  in the last 168 hours BMET  Recent Labs Lab 04/30/13 2127 04/30/13 2141  NA 137 138  K 3.9 3.7  CL 95* 96  CO2 18*  --   BUN 20 22  CREATININE 1.26 1.40*  GLUCOSE 976*  >700*   Electrolytes  Recent Labs Lab 04/30/13 2127  CALCIUM 8.5   Sepsis Markers  Recent Labs Lab 04/30/13 2130  LATICACIDVEN 8.7*   ABG  Recent Labs Lab 04/30/13 2144  PHART 7.232*  PCO2ART 57.4*  PO2ART 117.0*   Liver Enzymes  Recent Labs Lab 04/30/13 2127  AST 24  ALT 20  ALKPHOS 127*  BILITOT 0.4  ALBUMIN 3.5   Cardiac Enzymes  Recent Labs Lab 04/30/13 2130  TROPONINI <0.30   Glucose  Recent Labs Lab 04/30/13 2339  GLUCAP >600*    Imaging Ct Head Wo Contrast  04/30/2013   CLINICAL DATA:  Altered  mental status.  EXAM: CT HEAD WITHOUT CONTRAST  TECHNIQUE: Contiguous axial images were obtained from the base of the skull through the vertex without intravenous contrast.  COMPARISON:  None.  FINDINGS: Mild cerebral atrophy. No acute intracranial abnormality. Specifically, no hemorrhage, hydrocephalus, mass lesion, acute infarction, or significant intracranial injury. No acute calvarial abnormality. Visualized paranasal sinuses and mastoids clear. Orbital soft tissues unremarkable.  IMPRESSION: No acute intracranial abnormality.  Mild cerebral atrophy.   Electronically Signed   By: Rolm Baptise M.D.   On: 04/30/2013 21:41     CXR: Congestive heart failure. Patchy consolidation of medial left lung base which can be due to superimposed pneumonia.  ASSESSMENT / PLAN:  PULMONARY A: combined metabolic and resp acidosis, pH seems to be getting better with current intervention P:   Currently not needing intubation, but needs close monitoring and repeat ABG or VBG every 4 hours. If pH worsens significantly, then intubate. CT abdomen will help investigate the bases of the lung to see if there is any pneumonia (CXR shows some patchy consolidation on LLL)  CARDIOVASCULAR A: AFib, Systolic HF EF 80-99% in 8338, MR, AR Has ST-T wave inversion on lateral leads, which are new compared to 2012, possibly rate-related P:  Currently rate is high due to acute  stress Repeat EKG, troponin Probably intravascularly depleted due to HHS and hemoconcentration  RENAL A:  Mild AKI, resolving after IVF P:   IVF resuscitation, minimize potentially nephrotoxic meds  GASTROINTESTINAL A:  Question of possible pancreatitis vs intraabdominal infection vs bowel ischemia P:   Pending: lipase, amylase. If lactate worse, will do CT abdomen Currently on broad spectrum abx  HEMATOLOGIC A:  Hemoconcentration P:  IVF resuscitation  INFECTIOUS A:  Seems to be less likely as a cause of fever but possibilities are: intraabdominal infection vs meningitis P:   Currently on Zosyn + vanco. Culture pending. CT abdomen and head pending. May need LP if does not improve  ENDOCRINE A:  Has HHS P:   Insulin drip, monitor electrolytes. May need to replace K, Mg, Phos soon IVF resuscitation with 2 liters NS bolus then maintenance 125 / hr  NEUROLOGIC A:  Acute delirium, most likely cause metabolic encephalopathy (HHS vs DT). Less likely meningitis. P:   Will manage HHS, will also place on precedex drip plus versed PRN. Banana bag daily. Broadly covered with abx for now. CT head pending. If does not improve soon, may need LP in the future.  TODAY'S SUMMARY: admitted for acute delirium, HHS, lactic acidosis, fever, AFib. Most likely etiology: HHS. Differentials: DT, needs to rule out intraabdominal infection, bowel ischemia, pancreatitis. Much less likely meningitis. On IVF, insulin drip, broad spectrum abx.  I have personally obtained a history, examined the patient, evaluated laboratory and imaging results, formulated the assessment and plan and placed orders. CRITICAL CARE: The patient is critically ill with multiple organ systems failure and requires high complexity decision making for assessment and support, frequent evaluation and titration of therapies, application of advanced monitoring technologies and extensive interpretation of multiple databases. Critical  Care Time devoted to patient care services described in this note is 45 minutes.    Pulmonary and [place] Pager: [phone]  05/01/2013, 12:29 AM

## 2013-05-01 NOTE — ED Notes (Signed)
Unable to complete CT.  30M nurse notified

## 2013-05-01 NOTE — Procedures (Signed)
Central Venous Catheter Insertion Procedure Note Don Brown [id number] October 23, 1955  Procedure: Insertion of Central Venous Catheter Indications: Drug and/or fluid administration and Frequent blood sampling  Procedure Details Consent: Unable to obtain consent because of emergent medical necessity. Time Out: Verified patient identification, verified procedure, site/side was marked, verified correct patient position, special equipment/implants available, medications/allergies/relevent history reviewed, required imaging and test results available.  Performed  Maximum sterile technique was used including antiseptics, cap, gloves, gown, hand hygiene, mask and sheet. Skin prep: Chlorhexidine; local anesthetic administered A antimicrobial bonded/coated triple lumen catheter was placed in the right internal jugular vein using the Seldinger technique.  Evaluation Blood flow good Complications: No apparent complications Patient did tolerate procedure well. Chest X-ray ordered to verify placement.  CXR: pending.  Don Brown 05/01/2013, 5:40 AM

## 2013-05-01 NOTE — Progress Notes (Signed)
eLink Physician-Brief Progress Note Patient Name: Don Brown DOB: June 08, 1955 MRN: [id number]  Date of Service  05/01/2013   HPI/Events of Note  Hypokalemia AG now closed and ready to transition to SSI   eICU Interventions  K replaced Insulin gtt stopped and SSI initiated D5 stopped   Intervention Category Intermediate Interventions: Electrolyte abnormality - evaluation and management;Hyperglycemia - evaluation and treatment  Don Brown S. 05/01/2013, 7:21 PM

## 2013-05-01 NOTE — Progress Notes (Signed)
Patient ID: Don Brown, male   DOB: 1955-05-02, 58 y.o.   MRN: [id number]    Name: Don Brown MRN: [id number] DOB: [birth year]    ADMISSION DATE:  04/30/2013 REFERRING MD : ED  PRIMARY SERVICE: PCCM  CHIEF COMPLAINT: Altered mental status   BRIEF PATIENT DESCRIPTION: 57 M with known chronic pancreatitis, AFib, HF, obesity, HTN, HLD, coming in with acute agitation and fever, has HHS + elevated lactate. Concern for possible DT vs intraabdominal process (pancreatitis vs infection vs bowel ischemia). Cannot rule out meningitis but less likely (no headache, no nuchal rigidity)   SIGNIFICANT EVENTS / STUDIES:  CT head 04/30/13 negative for acute process   LINES / TUBES:  Foley 04/30/13 >>>   CULTURES:  2/8 Bld Cx: pending   ANTIBIOTICS:  Zosyn 05/01/13 >>>  Vancomycin 05/01/13 >>>   SUBJECTIVE: Sedated  VITAL SIGNS: Temp:  [98.2 F (36.8 C)-102.6 F (39.2 C)] 99 F (37.2 C) (02/09 0700) Pulse Rate:  [40-148] 84 (02/09 0700) Resp:  [14-36] 20 (02/09 0700) BP: (83-204)/(47-120) 113/71 mmHg (02/09 0700) SpO2:  [94 %-100 %] 100 % (02/09 0700) Weight:  [316 lb 2.2 oz (143.4 kg)-330 lb 11 oz (150 kg)] 316 lb 2.2 oz (143.4 kg) (02/09 0414) HEMODYNAMICS:   VENTILATOR SETTINGS:   INTAKE / OUTPUT: Intake/Output     02/08 0701 - 02/09 0700 02/09 0701 - 02/10 0700   I.V. (mL/kg) 3199.1 (22.3)    IV Piggyback 300    Total Intake(mL/kg) 3499.1 (24.4)    Urine (mL/kg/hr) 1170    Total Output 1170     Net +2329.1            PHYSICAL EXAMINATION: General: Sedated. Does not wake to name or pain Neuro: Sedated.  HEENT: No enlarged LN, no stridor, EOM full and equal  Cardiovascular: Irregular, no loud murmur  Lungs: Slightly diminished air entry bilaterally, diffuse rhonchi Abdomen: Soft, no tenderness, no guarding  Musculoskeletal: No leg edema, no clubbing   LABS:  CBC  Recent Labs Lab 04/30/13 2127 04/30/13 2141 04/30/13 2337 05/01/13 0652  WBC 5.1  --  7.1 5.8  HGB 15.4  18.4* 16.0 12.9*  HCT 47.7 54.0* 49.2 39.3  PLT 123*  --  127* 115*   Coag's No results found for this basename: APTT, INR,  in the last 168 hours BMET  Recent Labs Lab 05/01/13 0131 05/01/13 0304 05/01/13 0504  NA 138 151* 151*  K 4.5 4.1 3.9  CL 100 118* 118*  CO2 21 21 21   BUN 19 18 18   CREATININE 1.04 1.44* 1.44*  GLUCOSE 679* 137* 138*   Electrolytes  Recent Labs Lab 05/01/13 0131 05/01/13 0304 05/01/13 0504  CALCIUM 8.4 7.7* 7.7*  MG 2.3  --  2.3  PHOS 3.7  --  2.9   Sepsis Markers  Recent Labs Lab 04/30/13 2130 05/01/13 0051 05/01/13 0500  LATICACIDVEN 8.7* 2.8* 1.9   ABG  Recent Labs Lab 04/30/13 2144  PHART 7.232*  PCO2ART 57.4*  PO2ART 117.0*   Liver Enzymes  Recent Labs Lab 04/30/13 2127 05/01/13 0504  AST 24 14  ALT 20 13  ALKPHOS 127* 76  BILITOT 0.4 0.4  ALBUMIN 3.5 2.8*   Cardiac Enzymes  Recent Labs Lab 04/30/13 2130 05/01/13 0500  TROPONINI <0.30 0.45*   Glucose  Recent Labs Lab 04/30/13 2339 05/01/13 0105 05/01/13 0224 05/01/13 0408 05/01/13 0539  GLUCAP >600* >600* 347* 187* 143*    Imaging Ct Head Wo Contrast  04/30/2013   CLINICAL  DATA:  Altered mental status.  EXAM: CT HEAD WITHOUT CONTRAST  TECHNIQUE: Contiguous axial images were obtained from the base of the skull through the vertex without intravenous contrast.  COMPARISON:  None.  FINDINGS: Mild cerebral atrophy. No acute intracranial abnormality. Specifically, no hemorrhage, hydrocephalus, mass lesion, acute infarction, or significant intracranial injury. No acute calvarial abnormality. Visualized paranasal sinuses and mastoids clear. Orbital soft tissues unremarkable.  IMPRESSION: No acute intracranial abnormality.  Mild cerebral atrophy.   Electronically Signed   By: Rolm Baptise M.D.   On: 04/30/2013 21:41   Dg Chest Port 1 View  05/01/2013   CLINICAL DATA:  Central line placement.  EXAM: PORTABLE CHEST - 1 VIEW  COMPARISON:  05/01/2013 at 0051 hr   FINDINGS: Right internal jugular central venous line tip lies in the lower superior vena cava, well positioned. There is no pneumothorax.  Lung volumes are low. There is central vascular congestion, mild irregular interstitial thickening and cardiomegaly findings supporting mild congestive heart failure.  IMPRESSION: New right internal jugular central venous line tip in the lower superior vena cava. No pneumothorax. No other change.   Electronically Signed   By: Lajean Manes M.D.   On: 05/01/2013 07:29   Dg Chest Port 1 View  05/01/2013   CLINICAL DATA:  Pneumonia  EXAM: PORTABLE CHEST - 1 VIEW  COMPARISON:  None.  FINDINGS: The lung volumes are low. The heart size is markedly enlarged. There is minimal fluid in the minor fissure. There is increased interstitial markings and central pulmonary markings bilaterally, left greater than right. There is patchy consolidation of medial left lung base. No acute abnormalities identified within the visualized bones.  IMPRESSION: Congestive heart failure. Patchy consolidation of medial left lung base which can be due to superimposed pneumonia.   Electronically Signed   By: Abelardo Diesel M.D.   On: 05/01/2013 01:06    CXR: Congestive heart failure. Patchy consolidation of medial left lung base which can be due to superimposed pneumonia.   ASSESSMENT / PLAN:  PULMONARY  A: combined metabolic and resp acidosis, pH seems to be getting better with current intervention.  Unable to protect airway. P:  - Intubate. - Mechanically ventilate.  - CXR and ABG now. - CXR and ABG in AM. - Adjust vent to ABG.  CARDIOVASCULAR  A: AFib, Systolic HF EF 29-92% in 4268, MR, AR  Has ST-T wave inversion on lateral leads, which are new compared to 2012, possibly rate-related  A-fib with RVR now. P:  - Rate controlled. - Troponin elevated this am (o.30 >> 0.45); start ASA.  Will start heparin [place] score of 2 with positive trops and a-fib with RVR. - Cardizem drip for rate  control. - Repeat 2D echo. - Volume resuscitation, will place central line in AM (stble hemodynamically for now).  RENAL  A: Mild AKI, resolving after IVF  P:  - IVF resuscitation, minimize potentially nephrotoxic meds  Hypernatremia with hypochloridemia (139 >> 151 na )  Cr 1.04 > 1.44  GASTROINTESTINAL  A: Question of possible pancreatitis vs intraabdominal infection vs bowel ischemia  P:  - Lactic acid improving 2.8 >> 1.9  - Lipase, amylase normal   - Currently on broad spctrum abx   HEMATOLOGIC  A: Hemoconcentration  P:  - IVF resuscitation with D5 1/2 NS at 100 ml/hr. - Hep sq proph; scds.  INFECTIOUS  A: Seems to be less likely as a cause of fever but possibilities are: intraabdominal infection vs meningitis  P:  -  Currently on Zosyn + vanco. Culture pending. CT head no acute process.  - No LP, supple neck prior to intubation.  ENDOCRINE  A: Has HHS  P:  - Insulin drip, monitor electrolytes.  - K, Mg, Phos still within normal ranges; continue to monitor. - Received 3 liters NS bolus, then maintenance D5 1/2 NS @ 100 ml hr and banana bag. - CBG 143 today; AG  today to 12 (normal), delta gap normal  A1c ordered  NEUROLOGIC  A: Acute delirium, most likely cause metabolic encephalopathy (HHS vs DT). Less likely meningitis.  P:  - Propofol drip for agitation and severe HTN - Banana bag daily for presumed etoh abuse with DT's on admittance.  - Broadly covered with abx for now. CT head normal.  TODAY'S SUMMARY: CBG controlled on insulin drip, HHS, lactic acidosis, fever, AFib. Most likely etiology: HHS. Differentials: DT,lactic acid normal, unlikely  bowel ischemia. Pancreatic enzymes normal. Much less likely meningitis. On IVF, insulin drip, broad spectrum abx.  I have personally obtained a history, examined the patient, evaluated laboratory and imaging results, formulated the assessment and plan and placed orders.  CRITICAL CARE: The patient is critically ill with  multiple organ systems failure and requires high complexity decision making for assessment and support, frequent evaluation and titration of therapies, application of advanced monitoring technologies and extensive interpretation of multiple databases. Critical Care Time devoted to patient care services described in this note is 35 minutes.   Rush Farmer, M.D. [facility] Pulmonary/Critical Care Medicine. Pager: [phone]. After hours pager: [phone].

## 2013-05-01 NOTE — Progress Notes (Signed)
  Echocardiogram 2D Echocardiogram has been performed.  Don Brown 05/01/2013, 5:46 PM

## 2013-05-01 NOTE — Progress Notes (Signed)
ANTICOAGULATION CONSULT NOTE - Follow Up Consult  Pharmacy Consult for heparin Indication: r/o ACS, afib  Allergies  Allergen Reactions  . Morphine And Related Nausea And Vomiting    Patient Measurements: Height: 6' 0.05" (183 cm) Weight: 316 lb 2.2 oz (143.4 kg) IBW/kg (Calculated) : 77.71 Heparin Dosing Weight: 110kg  Vital Signs: Temp: 98.9 F (37.2 C) (02/09 1400) Temp src: Core (Comment) (02/09 1400) BP: 106/63 mmHg (02/09 2002) Pulse Rate: 84 (02/09 2002)  Labs:  Recent Labs  04/30/13 2127  04/30/13 2141 04/30/13 2337  05/01/13 0304  05/01/13 0504 05/01/13 0652 05/01/13 1100 05/01/13 1243 05/01/13 1710 05/01/13 1843 05/01/13 1855  HGB 15.4  --  18.4* 16.0  --   --   --   --  12.9*  --   --   --   --   --   HCT 47.7  --  54.0* 49.2  --   --   --   --  39.3  --   --   --   --   --   PLT 123*  --   --  127*  --   --   --   --  115*  --   --   --   --   --   HEPARINUNFRC  --   --   --   --   --   --   --   --   --   --   --   --   --  0.45  CREATININE 1.26  --  1.40* 1.11  < > 1.44*  --  1.44*  --   --   --  1.09  --   --   TROPONINI  --   < >  --   --   --   --   < >  --   --  0.38* 0.38*  --  0.31*  --   < > = values in this interval not displayed.  Estimated Creatinine Clearance: 108.7 ml/min (by C-G formula based on Cr of 1.09).   Medications:  Scheduled:  . [START ON 05/02/2013] antiseptic oral rinse  15 mL Mouth Rinse QID  . aspirin  325 mg Oral Daily  . chlorhexidine  15 mL Mouth Rinse BID  . feeding supplement (PRO-STAT SUGAR FREE 64)  60 mL Per Tube QID  . feeding supplement (VITAL HIGH PROTEIN)  1,000 mL Per Tube Q24H  . insulin aspart  0-15 Units Subcutaneous Q4H  . insulin aspart  3 Units Subcutaneous Q4H  . midazolam  2 mg Intravenous Once  . midazolam  4 mg Intravenous Once  . pantoprazole (PROTONIX) IV  40 mg Intravenous QHS  . piperacillin-tazobactam (ZOSYN)  IV  3.375 g Intravenous Q8H  . potassium chloride  40 mEq Oral Daily  . [START  ON 05/02/2013] banana bag IV 1000 mL   Intravenous Once  . sodium chloride  1,000 mL Intravenous Once  . vancomycin  1,500 mg Intravenous Q12H   Infusions:  . dextrose    . heparin 1,550 Units/hr (05/01/13 1319)  . propofol 50 mcg/kg/min (05/01/13 2000)    Assessment: 89 YOM with Afib (CHADS2 = 2) and positive troponin on heparin at 1500 units/hr and initial level noted at goal (HL= 0.45)  Goal of Therapy:  Heparin level 0.3-0.7 units/ml Monitor platelets by anticoagulation protocol: Yes   Plan:   -No heparin changes needed -Daily heparin level and CBC  Hildred Laser, Pharm D 05/01/2013  8:09 PM

## 2013-05-02 ENCOUNTER — Inpatient Hospital Stay (HOSPITAL_COMMUNITY): Payer: Non-veteran care

## 2013-05-02 LAB — CBC
HCT: 47.3 % (ref 39.0–52.0)
HEMATOCRIT: 41.1 % (ref 39.0–52.0)
HEMOGLOBIN: 13.5 g/dL (ref 13.0–17.0)
Hemoglobin: 15.3 g/dL (ref 13.0–17.0)
MCH: 27.6 pg (ref 26.0–34.0)
MCH: 28.1 pg (ref 26.0–34.0)
MCHC: 32.3 g/dL (ref 30.0–36.0)
MCHC: 32.8 g/dL (ref 30.0–36.0)
MCV: 85.2 fL (ref 78.0–100.0)
MCV: 85.4 fL (ref 78.0–100.0)
Platelets: 94 10*3/uL — ABNORMAL LOW (ref 150–400)
Platelets: 106 10*3/uL — ABNORMAL LOW (ref 150–400)
RBC: 5.55 MIL/uL (ref 4.22–5.81)
RBC: 4.81 MIL/uL (ref 4.22–5.81)
RDW: 15.5 % (ref 11.5–15.5)
RDW: 15.8 % — ABNORMAL HIGH (ref 11.5–15.5)
WBC: 5.2 10*3/uL (ref 4.0–10.5)
WBC: 6 10*3/uL (ref 4.0–10.5)

## 2013-05-02 LAB — BASIC METABOLIC PANEL
BUN: 15 mg/dL (ref 6–23)
BUN: 19 mg/dL (ref 6–23)
CO2: 17 mEq/L — ABNORMAL LOW (ref 19–32)
CO2: 19 mEq/L (ref 19–32)
CREATININE: 1.29 mg/dL (ref 0.50–1.35)
Calcium: 8.4 mg/dL (ref 8.4–10.5)
Calcium: 8.1 mg/dL — ABNORMAL LOW (ref 8.4–10.5)
Chloride: 113 mEq/L — ABNORMAL HIGH (ref 96–112)
Chloride: 117 mEq/L — ABNORMAL HIGH (ref 96–112)
Creatinine, Ser: 0.96 mg/dL (ref 0.50–1.35)
GFR calc Af Amer: >90 mL/min (ref >90)
GFR calc Af Amer: 69 mL/min — ABNORMAL LOW (ref >90)
GFR, EST NON AFRICAN AMERICAN: 90 mL/min — AB (ref >90)
GFR, EST NON AFRICAN AMERICAN: 60 mL/min — AB (ref >90)
GLUCOSE: 297 mg/dL — AB (ref 70–99)
Glucose, Bld: 323 mg/dL — ABNORMAL HIGH (ref 70–99)
POTASSIUM: 4.1 meq/L (ref 3.7–5.3)
Potassium: 3.7 mEq/L (ref 3.7–5.3)
Sodium: 146 mEq/L (ref 137–147)
Sodium: 147 mEq/L (ref 137–147)

## 2013-05-02 LAB — MAGNESIUM
Magnesium: 2 mg/dL (ref 1.5–2.5)
Magnesium: 2 mg/dL (ref 1.5–2.5)

## 2013-05-02 LAB — GLUCOSE, CAPILLARY
GLUCOSE-CAPILLARY: 285 mg/dL — AB (ref 70–99)
Glucose-Capillary: 237 mg/dL — ABNORMAL HIGH (ref 70–99)
Glucose-Capillary: 263 mg/dL — ABNORMAL HIGH (ref 70–99)
Glucose-Capillary: 277 mg/dL — ABNORMAL HIGH (ref 70–99)
Glucose-Capillary: 278 mg/dL — ABNORMAL HIGH (ref 70–99)
Glucose-Capillary: 373 mg/dL — ABNORMAL HIGH (ref 70–99)

## 2013-05-02 LAB — POCT I-STAT 3, ART BLOOD GAS (G3+)
Acid-base deficit: 5 mmol/L — ABNORMAL HIGH (ref 0.0–2.0)
Bicarbonate: 18.9 mEq/L — ABNORMAL LOW (ref 20.0–24.0)
O2 SAT: 99 %
PCO2 ART: 32.9 mmHg — AB (ref 35.0–45.0)
TCO2: 20 mmol/L (ref 0–100)
pH, Arterial: 7.372 (ref 7.350–7.450)
pO2, Arterial: 131 mmHg — ABNORMAL HIGH (ref 80.0–100.0)

## 2013-05-02 LAB — PHOSPHORUS
PHOSPHORUS: 2.1 mg/dL — AB (ref 2.3–4.6)
Phosphorus: 2.2 mg/dL — ABNORMAL LOW (ref 2.3–4.6)

## 2013-05-02 LAB — HEPARIN LEVEL (UNFRACTIONATED): Heparin Unfractionated: 0.38 IU/mL (ref 0.30–0.70)

## 2013-05-02 MED ORDER — DEXMEDETOMIDINE HCL IN NACL 200 MCG/50ML IV SOLN
0.4000 ug/kg/h | INTRAVENOUS | Status: DC
  Administered 2013-05-02: 0.4 ug/kg/h via INTRAVENOUS
  Administered 2013-05-02: 1 ug/kg/h via INTRAVENOUS
  Filled 2013-05-02 (×3): qty 50

## 2013-05-02 MED ORDER — ACETAMINOPHEN 160 MG/5ML PO SOLN: 500.0000 mg | Freq: Four times a day (QID) | ORAL | Status: DC | PRN

## 2013-05-02 MED ORDER — VITAMIN B-1 100 MG PO TABS
100.0000 mg | ORAL_TABLET | Freq: Every day | ORAL | Status: DC
  Administered 2013-05-03 – 2013-05-16 (×12): 100 mg via ORAL
  Filled 2013-05-02 (×15): qty 1

## 2013-05-02 MED ORDER — VITAL HIGH PROTEIN PO LIQD
1000.0000 mL | ORAL | Status: DC
  Administered 2013-05-02 – 2013-05-03 (×2): 1000 mL
  Filled 2013-05-02 (×2): qty 1000

## 2013-05-02 MED ORDER — INSULIN GLARGINE 100 UNIT/ML ~~LOC~~ SOLN
5.0000 [IU] | Freq: Every day | SUBCUTANEOUS | Status: DC
  Filled 2013-05-02: qty 0.05

## 2013-05-02 MED ORDER — MIDAZOLAM HCL 2 MG/2ML IJ SOLN
INTRAMUSCULAR | Status: AC
  Filled 2013-05-02: qty 2

## 2013-05-02 MED ORDER — FOLIC ACID 1 MG PO TABS
1.0000 mg | ORAL_TABLET | Freq: Every day | ORAL | Status: DC
  Administered 2013-05-03 – 2013-05-16 (×12): 1 mg via ORAL
  Filled 2013-05-02 (×15): qty 1

## 2013-05-02 MED ORDER — POTASSIUM PHOSPHATE DIBASIC 3 MMOLE/ML IV SOLN
10.0000 mmol | Freq: Once | INTRAVENOUS | Status: AC
  Administered 2013-05-02: 10 mmol via INTRAVENOUS
  Filled 2013-05-02: qty 3.33

## 2013-05-02 MED ORDER — DILTIAZEM 12 MG/ML ORAL SUSPENSION
60.0000 mg | Freq: Four times a day (QID) | ORAL | Status: DC
  Administered 2013-05-02 – 2013-05-05 (×12): 60 mg via ORAL
  Filled 2013-05-02 (×16): qty 6

## 2013-05-02 MED ORDER — PRO-STAT SUGAR FREE PO LIQD
30.0000 mL | Freq: Three times a day (TID) | ORAL | Status: DC
Start: 2013-05-02 — End: 2013-05-05
  Administered 2013-05-02 – 2013-05-04 (×7): 30 mL
  Filled 2013-05-02 (×10): qty 30

## 2013-05-02 MED ORDER — INSULIN GLARGINE 100 UNIT/ML ~~LOC~~ SOLN
25.0000 [IU] | Freq: Every day | SUBCUTANEOUS | Status: DC
  Administered 2013-05-02: 25 [IU] via SUBCUTANEOUS
  Filled 2013-05-02 (×2): qty 0.25

## 2013-05-02 MED ORDER — PANTOPRAZOLE SODIUM 40 MG PO PACK
40.0000 mg | PACK | Freq: Every day | ORAL | Status: DC
Start: 2013-05-02 — End: 2013-05-05
  Administered 2013-05-02 – 2013-05-04 (×3): 40 mg
  Filled 2013-05-02 (×4): qty 20

## 2013-05-02 MED ORDER — DILTIAZEM HCL 100 MG IV SOLR
5.0000 mg/h | INTRAVENOUS | Status: DC
  Administered 2013-05-02: 20 mg/h via INTRAVENOUS
  Administered 2013-05-02: 5 mg/h via INTRAVENOUS
  Filled 2013-05-02 (×3): qty 100

## 2013-05-02 MED ORDER — ADULT MULTIVITAMIN LIQUID CH
5.0000 mL | Freq: Every day | ORAL | Status: DC
  Administered 2013-05-03 – 2013-05-15 (×9): 5 mL via ORAL
  Filled 2013-05-02 (×15): qty 5

## 2013-05-02 MED ORDER — FUROSEMIDE 10 MG/ML IJ SOLN
40.0000 mg | Freq: Four times a day (QID) | INTRAMUSCULAR | Status: AC
  Administered 2013-05-02 (×3): 40 mg via INTRAVENOUS
  Filled 2013-05-02 (×3): qty 4

## 2013-05-02 MED ORDER — POTASSIUM CHLORIDE 20 MEQ/15ML (10%) PO LIQD
40.0000 meq | Freq: Once | ORAL | Status: AC
  Administered 2013-05-02: 40 meq via ORAL
  Filled 2013-05-02: qty 30

## 2013-05-02 MED ORDER — VITAL HIGH PROTEIN PO LIQD: 1000.0000 mL | ORAL | Status: DC

## 2013-05-02 MED ORDER — FENTANYL CITRATE 0.05 MG/ML IJ SOLN
50.0000 ug | Freq: Once | INTRAMUSCULAR | Status: DC
  Filled 2013-05-02: qty 2

## 2013-05-02 MED ORDER — NALOXONE HCL 0.4 MG/ML IJ SOLN
INTRAMUSCULAR | Status: AC
  Administered 2013-05-02: 0.4 mg
  Filled 2013-05-02: qty 1

## 2013-05-02 MED ORDER — INSULIN GLARGINE 100 UNIT/ML ~~LOC~~ SOLN: 10.0000 [IU] | Freq: Every day | SUBCUTANEOUS | Status: DC

## 2013-05-02 MED ORDER — DILTIAZEM LOAD VIA INFUSION
20.0000 mg | Freq: Once | INTRAVENOUS | Status: AC
  Administered 2013-05-02: 20 mg via INTRAVENOUS
  Filled 2013-05-02: qty 20

## 2013-05-02 MED ORDER — FENTANYL CITRATE 0.05 MG/ML IJ SOLN
50.0000 ug | INTRAMUSCULAR | Status: DC | PRN
Start: 2013-05-02 — End: 2013-05-05
  Administered 2013-05-02: 100 ug via INTRAVENOUS
  Filled 2013-05-02: qty 2

## 2013-05-02 MED ORDER — ACETAMINOPHEN 160 MG/5ML PO SOLN: 650.0000 mg | Freq: Four times a day (QID) | ORAL | Status: DC | PRN

## 2013-05-02 MED ORDER — ACETAMINOPHEN 160 MG/5ML PO SOLN
650.0000 mg | Freq: Four times a day (QID) | ORAL | Status: DC | PRN
  Administered 2013-05-02 – 2013-05-05 (×4): 650 mg
  Filled 2013-05-02 (×4): qty 20.3

## 2013-05-02 MED ORDER — SODIUM CHLORIDE 0.9 % IV SOLN
2.0000 mg/h | INTRAVENOUS | Status: DC
  Administered 2013-05-02: 2 mg/h via INTRAVENOUS
  Administered 2013-05-04: 3 mg/h via INTRAVENOUS
  Filled 2013-05-02 (×5): qty 10

## 2013-05-02 MED ORDER — HYDRALAZINE HCL 20 MG/ML IJ SOLN
10.0000 mg | INTRAMUSCULAR | Status: DC | PRN
  Administered 2013-05-02: 40 mg via INTRAVENOUS
  Administered 2013-05-02: 20 mg via INTRAVENOUS
  Administered 2013-05-02: 10 mg via INTRAVENOUS
  Administered 2013-05-06: 20 mg via INTRAVENOUS
  Filled 2013-05-02 (×3): qty 1
  Filled 2013-05-02 (×2): qty 2

## 2013-05-02 MED ORDER — FENTANYL CITRATE 0.05 MG/ML IJ SOLN: 25.0000 ug | Freq: Once | INTRAMUSCULAR | Status: DC

## 2013-05-02 MED ORDER — SODIUM CHLORIDE 0.9 % IV SOLN
25.0000 ug/h | INTRAVENOUS | Status: DC
  Administered 2013-05-02: 100 ug/h via INTRAVENOUS
  Administered 2013-05-03 – 2013-05-04 (×3): 200 ug/h via INTRAVENOUS
  Administered 2013-05-04: 300 ug/h via INTRAVENOUS
  Administered 2013-05-05: 30 ug/h via INTRAVENOUS
  Filled 2013-05-02 (×6): qty 50

## 2013-05-02 NOTE — Care Management Note (Addendum)
    Page 1 of 2   05/17/2013     2:07:17 PM   CARE MANAGEMENT NOTE 05/17/2013  Patient:  [facility]   Account Number:  [id number]  Date Initiated:  05/02/2013  Documentation initiated by:  [facility]  Subjective/Objective Assessment:   ADmitted with fever and elevated lactic acid.  intubated on 2-9     Action/Plan:   Anticipated DC Date:  05/09/2013   Anticipated DC Plan:  [place]  CM consult      Choice offered to / List presented to:          [facility] arranged  HH-1 RN  [place] [facility]      [place].   Status of service:  In process, will continue to follow Medicare Important Message given?   (If response is "NO", the following Medicare IM given date fields will be blank) Date Medicare IM given:   Date Additional Medicare IM given:    Discharge Disposition:    Per UR Regulation:  Reviewed for med. necessity/level of care/duration of stay  If discussed at [place] of Stay Meetings, dates discussed:    Comments:  ContactDuran, [place] Daughter [phone]  05/17/13 Fuller Mandril, RN, BSN, NCM [phone] Spoke to pt at bedside regarding discharge planning. Offered list of [place] agencies.  Pt chose [facility] to render services.  Janae Sauce, RN of [facility] notified.  PT recommends rolling walker for home use.  Darlyn Chamber of [facility] notified and delivered to pt prior to d/c home. [facility] to [facility].  Rose will pt tomorrow to set up appt with pt as pt is very sleepy at this time.  PT PCP is Dr. Michell Heinrich at [facility].  05/16/13 [place], RN, BSN, [place] [phone] Spoke with Dr. [place] Pink regarding pt status. Pt renal fx improving and in need of diuresing.  Would like to keep pt at least one more day and if pt still refusing tests/care; MD will D/C.  05/10/13 0845 Fuller Mandril, RN, BSN, NCM [phone] Spoke to Jeneen Montgomery, SW at [place] in [place] to get  approval for [facility] services for pt.  Notified Ke[place] of new DM medications pt will be requiring.  Advised to fax order of medications, meter and needle scrpits to [phone] ASAP so [facility] can have it prepared for pt.   [facility] [place].  05-05-13 2:25pm Luz Lex, [place] [phone] Patient extubated but in Afib with RVR - started on cardizem drip.  Updated [facility] - April at [place].  States will not take till at least 24 hours post extubation.  05-02-13 2:20pm Luz Lex, [place] - [phone] Notifed [place] - April - of Admission.  Put in a note of notification of admission in [place] system.

## 2013-05-02 NOTE — Progress Notes (Signed)
Patient ID: Don Brown, male   DOB: [birth year], 58 y.o.   MRN: [id number]    Name: Ezrael Sam MRN: [id number] DOB: 30-Mar-1955    ADMISSION DATE:  04/30/2013 REFERRING MD : ED  PRIMARY SERVICE: PCCM  CHIEF COMPLAINT: Altered mental status   BRIEF PATIENT DESCRIPTION: 47 M with known chronic pancreatitis, AFib, HF, obesity, HTN, HLD, coming in with acute agitation and fever, has HHS + elevated lactate. Concern for possible DT vs intraabdominal process (pancreatitis vs infection vs bowel ischemia). Cannot rule out meningitis but less likely (no headache, no nuchal rigidity)   SIGNIFICANT EVENTS / STUDIES:  CT head 04/30/13 negative for acute process  2/8 Neg HIV, Hep B and Hep C  2/9 CT : RLL > LLL opacity, likely atelectasis, ? PNA, liver lesions (cyst), pancreatic cyst (No acute pancreatitis), Gastriris?, Mild adenopathy along the gastrohepatic ligament, no abscess. Below the diaphragm, there is no convincing source for sepsis. No evidence of bowel ischemia. 2/10 Echo: Left ventricle:severely dilated.  LVH. Systolic severely reduced. The EF 20% to 25%. Diffuse hypokinesis.Right atrium: mildly dilated.  LINES / TUBES:  Foley 04/30/13 >>>  ETT>> 2/9  CULTURES:  2/8 Bld Cx: pending  ANTIBIOTICS:  Zosyn 05/01/13 >>>  Vancomycin 05/01/13 >>>   SUBJECTIVE: Sedated  VITAL SIGNS: Temp:  [98.4 F (36.9 C)-100 F (37.8 C)] 99.8 F (37.7 C) (02/10 0700) Pulse Rate:  [38-151] 117 (02/10 0700) Resp:  [16-33] 29 (02/10 0700) BP: (80-223)/(51-122) 159/87 mmHg (02/10 0700) SpO2:  [93 %-100 %] 94 % (02/10 0700) FiO2 (%):  [40 %-100 %] 40 % (02/10 0325) Weight:  [328 lb 7.8 oz (149 kg)] 328 lb 7.8 oz (149 kg) (02/10 0117) HEMODYNAMICS: CVP:  [2 mmHg-13 mmHg] 13 mmHg VENTILATOR SETTINGS: Vent Mode:  [-] PRVC FiO2 (%):  [40 %-100 %] 40 % Set Rate:  [14 bmp-20 bmp] 20 bmp Vt Set:  [500 mL] 500 mL PEEP:  [5 cmH20] 5 cmH20 Plateau Pressure:  [17 cmH20-19 cmH20] 19 cmH20 INTAKE /  OUTPUT: Intake/Output     02/09 0701 - 02/10 0700 02/10 0701 - 02/11 0700   P.O. 800    I.V. (mL/kg) 2473.7 (16.6)    NG/GT 335.4    IV Piggyback 1200    Total Intake(mL/kg) 4809.1 (32.3)    Urine (mL/kg/hr) 2150 (0.6)    Total Output 2150     Net +2659.1          Urine Occurrence 1 x      PHYSICAL EXAMINATION: General: Sedated. Does not wake to name or pain Neuro: Sedated.  HEENT: No enlarged LN, no stridor, EOM full and equal  Cardiovascular: Irregular, no loud murmur  Lungs: Slightly diminished air entry bilaterally, diffuse rhonchi Abdomen: Soft, no tenderness, no guarding  Musculoskeletal: No leg edema, no clubbing   LABS:  CBC  Recent Labs Lab 05/01/13 0652 05/01/13 2300 05/02/13 0426  WBC 5.8 3.7* 5.2  HGB 12.9* 12.5* 15.3  HCT 39.3 38.3* 47.3  PLT 115* 84* 94*   Coag's No results found for this basename: APTT, INR,  in the last 168 hours BMET  Recent Labs Lab 05/01/13 1710 05/01/13 2000 05/02/13 0426  NA 144 147 146  K 3.2* 4.0 3.7  CL 113* 116* 113*  CO2 20 19 17*  BUN '14 16 15  ' CREATININE 1.09 1.12 0.96  GLUCOSE 155* 237* 323*   Electrolytes  Recent Labs Lab 05/01/13 0504 05/01/13 1249 05/01/13 1710 05/01/13 2000 05/02/13 0002 05/02/13 0426  CALCIUM 7.7*  --  7.6* 7.5*  --  8.4  MG 2.3 2.0  --   --  2.0  --   PHOS 2.9 3.5  --   --  2.1*  --    Sepsis Markers  Recent Labs Lab 05/01/13 0500 05/01/13 0830 05/01/13 1300  LATICACIDVEN 1.9 0.9 1.1   ABG  Recent Labs Lab 05/01/13 1305 05/01/13 1448 05/01/13 1813  PHART 7.226* 7.296* 7.313*  PCO2ART 52.9* 44.2 40.5  PO2ART 234.0* 225.0* 90.0   Liver Enzymes  Recent Labs Lab 04/30/13 2127 05/01/13 0504  AST 24 14  ALT 20 13  ALKPHOS 127* 76  BILITOT 0.4 0.4  ALBUMIN 3.5 2.8*   Cardiac Enzymes  Recent Labs Lab 05/01/13 1100 05/01/13 1243 05/01/13 1843  TROPONINI 0.38* 0.38* 0.31*   Glucose  Recent Labs Lab 05/01/13 1641 05/01/13 1756 05/01/13 1900  05/01/13 2005 05/02/13 0018 05/02/13 0348  GLUCAP 158* 98 131* 193* 237* 263*   A1c 12.5   Imaging Ct Head Wo Contrast  04/30/2013   CLINICAL DATA:  Altered mental status.  EXAM: CT HEAD WITHOUT CONTRAST  TECHNIQUE: Contiguous axial images were obtained from the base of the skull through the vertex without intravenous contrast.  COMPARISON:  None.  FINDINGS: Mild cerebral atrophy. No acute intracranial abnormality. Specifically, no hemorrhage, hydrocephalus, mass lesion, acute infarction, or significant intracranial injury. No acute calvarial abnormality. Visualized paranasal sinuses and mastoids clear. Orbital soft tissues unremarkable.  IMPRESSION: No acute intracranial abnormality.  Mild cerebral atrophy.   Electronically Signed   By: Rolm Baptise M.D.   On: 04/30/2013 21:41   Ct Abdomen Pelvis W Contrast  05/01/2013   CLINICAL DATA:  Evaluate for possible abdominal infection. Looking for source of sepsis.  EXAM: CT ABDOMEN AND PELVIS WITH CONTRAST  TECHNIQUE: Multidetector CT imaging of the abdomen and pelvis was performed using the standard protocol following bolus administration of intravenous contrast.  CONTRAST:  164m OMNIPAQUE IOHEXOL 350 MG/ML SOLN  COMPARISON:  01/05/2011.  FINDINGS: There is confluent opacity in the right lower lobe at the posterior lung base. This is most likely atelectasis. There is a smaller area similar opacity at the left posterior medial lung base. There are adjacent coarse reticular opacities likely additional subsegmental atelectasis. The heart is mildly enlarged. Infiltrate at either lung base is possible.  There are 2 small low-density lesions in the lateral segment of the left lobe of the liver, with a third in the posterior medial right upper lobe and a fourth from the caudate lobe these were present previously. There are most likely cysts. There is another round cystic lesion lies along the gallbladder fossa. This may simply reflect gallbladder. It is stable.  There is some generalized increased attenuation from the caudate lobe.  The spleen is unremarkable. No bile duct dilation. There is irregular dilation of the pancreatic duct leading to a and irregular cystic appearance throughout pancreatic tail. This is stable. No discrete pancreatic mass or inflammatory changes seen.  No adrenal masses. Kidneys and ureters are unremarkable. The bladder is decompressed by a Foley catheter.  There are mildly enlarged gastrohepatic ligament lymph nodes, largest measuring 15 mm in short axis. There are mildly prominent retroperitoneal lymph nodes none of which are pathologically enlarged by size criteria. There is no ascites.  The colon and small bowel are unremarkable. A normal size appendix is seen.  There are prominent, tortuous vessels along the anterior and greater curvature region of the stomach. Nasogastric tube lies in the distal stomach. There is slight  inflammatory type stranding adjacent to the stomach particularly the antrum and pylorus region. This could reflect gastritis.  There are degenerative changes along the visible spine. No osteoblastic or osteolytic lesions. No CT evidence of discitis.  IMPRESSION: 1. There are multiple abnormalities as detailed above. 2. Lung base opacity now right lower lobe greater than left lower lobe bubbles likely atelectasis. Pneumonia is possible. 3. Low-density liver lesions, likely cysts it is stable. 4. Irregular cystic change along the tail the pancreas much of which likely a tortuous dilated duct. This suggests chronic pancreatitis. There is no convincing acute pancreatitis. 5. There is prominent vascularity adjacent to the stomach with subtle hazy opacity in the fat adjacent to the gastric antrum and pylorus. Consider gastritis in the proper clinical setting. 6. Mild adenopathy along the gastrohepatic ligament. 7. There is no evidence of an abscess. Below the diaphragm, there is no convincing source for sepsis. No evidence of bowel  ischemia.   Electronically Signed   By: Lajean Manes M.D.   On: 05/01/2013 15:33   Dg Chest Port 1 View  05/01/2013   CLINICAL DATA:  ET tube placement  EXAM: PORTABLE CHEST - 1 VIEW  COMPARISON:  05/01/2013  FINDINGS: Endotracheal tube tip projects 2 cm above the chronic, well positioned.  New gastric tube extends below the diaphragm into the stomach.  No change in cardiomegaly or in the appearance of the lungs. Stable right internal jugular central venous line, well positioned.  IMPRESSION: Endotracheal tube and gastric tube are well positioned. No change from the earlier study.   Electronically Signed   By: Lajean Manes M.D.   On: 05/01/2013 12:03   Dg Chest Port 1 View  05/01/2013   CLINICAL DATA:  Central line placement.  EXAM: PORTABLE CHEST - 1 VIEW  COMPARISON:  05/01/2013 at 0051 hr  FINDINGS: Right internal jugular central venous line tip lies in the lower superior vena cava, well positioned. There is no pneumothorax.  Lung volumes are low. There is central vascular congestion, mild irregular interstitial thickening and cardiomegaly findings supporting mild congestive heart failure.  IMPRESSION: New right internal jugular central venous line tip in the lower superior vena cava. No pneumothorax. No other change.   Electronically Signed   By: Lajean Manes M.D.   On: 05/01/2013 07:29   Dg Chest Port 1 View  05/01/2013   CLINICAL DATA:  Pneumonia  EXAM: PORTABLE CHEST - 1 VIEW  COMPARISON:  None.  FINDINGS: The lung volumes are low. The heart size is markedly enlarged. There is minimal fluid in the minor fissure. There is increased interstitial markings and central pulmonary markings bilaterally, left greater than right. There is patchy consolidation of medial left lung base. No acute abnormalities identified within the visualized bones.  IMPRESSION: Congestive heart failure. Patchy consolidation of medial left lung base which can be due to superimposed pneumonia.   Electronically Signed   By:  Abelardo Diesel M.D.   On: 05/01/2013 01:06    CXR: Congestive heart failure. Patchy consolidation of medial left lung base which can be due to superimposed pneumonia.   ASSESSMENT / PLAN:  PULMONARY  A: combined metabolic and resp acidosis, pH seems to be getting better with current intervention.  Unable to protect airway.      Intubated 2/9  P:  - Intubated 2/9. - Begin PS trials.  - CXR 2/10: ET tube too low. - ABG 2/10: pending - Adjust vent to ABG. - Lasix as ordered.  CARDIOVASCULAR  A:  AFib, Systolic HF EF 31-54% in 0086, MR, AR  Has ST-T wave inversion on lateral leads, which are new compared to 2012, possibly rate-related  A-fib with RVR now. P:  - HR 117, on cardizem drip and start cardizem 60 mg PO QID with holding parameters. - Troponin mildly elevated x3 (0.38 max), likely strain. ASA started 2/9.   - [place] score of 2 with  a-fib with RVR, heparin started yesterday, pt with hematuria after start of hep will d/c and monitor for now. - Echo: Left ventricle:severely dilated.  LVH. Systolic severely reduced. The EF 20% to 25%. Diffuse hypokinesis.Right atrium: mildly dilated. - KVO IVF.  RENAL  A: Mild AKI, resolving after IVF      Hematuria, after hep start 2/9 P:  - KVO IVF.  - Hypochloridemia mild, hyponatremia improved today. - Cr returned to baseline today. - Hb stable.  GASTROINTESTINAL  A: Question of possible pancreatitis vs intraabdominal infection vs bowel ischemia  P:  - Lactic acid improving 2.8 >> 1.9>>1.1 - Lipase, amylase normal   - Currently on broad spctrum abx  - PPI IV  HEMATOLOGIC  A: Hemoconcentration      Prior work up for Multiple myeloma? P:  - KVO NS and  banana bag. - Hematuria, Hb stable, continue monitor. - Hep sq proph; scds.  INFECTIOUS  A: Seems to be less likely as a cause of fever but possibilities are: intraabdominal infection vs meningitis  P:  - Currently on Zosyn + vanco.  - CT ABD/Pelvis Head report above - Culture  pending.  ENDOCRINE  A: Has HHS  P:  - Monitor electrolytes.  - SSI  - K and  Mg still within normal ranges; continue to monitor. Phos mildly low,  - CBG: elevated (263). AG: 16, delta 4 - Hg A1c 12.5 - Lantus 25 units QHS.  CBG (last 3)   Recent Labs  05/01/13 2005 05/02/13 0018 05/02/13 0348  GLUCAP 193* 237* 263*   NEUROLOGIC  A: Acute delirium, most likely cause metabolic encephalopathy (HHS vs DT). Less likely meningitis.  P:  - Propofol drip for agitation and severe HTN, precdex started - D/C banana bag.  - Broadly covered with abx for now. CT head normal.  TODAY'S SUMMARY: GAP beginning to reopen, lantus started,  AFib.  Most likely etiology: HHS. Broad spectrum abx.  Aggressive diureses today and begin weaning trials.  Daughters updated bedside.  CC time 35 min.  Patient seen and examined, agree with above note.  I dictated the care and orders written for this patient under my direction.  Rush Farmer, MD [phone]

## 2013-05-02 NOTE — Progress Notes (Signed)
RT called to room by RN.  RN stated that patient had jerked his head and pulled ETT out to 18.  ETT was originally at 26cm @ lip.  RT could hear a huge leak.  Attempted to advance ETT and ETT would not advance.  Patients sats were dropping, RT did not hear any breath sounds.  RT pulled ETT and began to bag patient.  RN called Warren Lacy and CCM MDs came to reintubate patient.  Patient was Reintubated by Marni Griffon and Dr Chase Caller with a size 8.0 ETT secured at 26@ lip using the glidescope.  RT placed patient back on the ventilator at previous settings.

## 2013-05-02 NOTE — Progress Notes (Addendum)
ANTICOAGULATION CONSULT NOTE - Follow Up Consult  Pharmacy Consult:  Heparin Indication:  Rule out ACS + Afib  Allergies  Allergen Reactions  . Morphine And Related Nausea And Vomiting    Patient Measurements: Height: 6' 0.05" (183 cm) Weight: 328 lb 7.8 oz (149 kg) IBW/kg (Calculated) : 77.71 Heparin Dosing Weight: 110 kg  Vital Signs: Temp: 100.1 F (37.8 C) (02/10 0800) BP: 166/91 mmHg (02/10 0800) Pulse Rate: 101 (02/10 0800)  Labs:  Recent Labs  05/01/13 0652 05/01/13 1100 05/01/13 1243 05/01/13 1710 05/01/13 1843 05/01/13 1855 05/01/13 2000 05/01/13 2300 05/02/13 0100 05/02/13 0426  HGB 12.9*  --   --   --   --   --   --  12.5*  --  15.3  HCT 39.3  --   --   --   --   --   --  38.3*  --  47.3  PLT 115*  --   --   --   --   --   --  84*  --  94*  HEPARINUNFRC  --   --   --   --   --  0.45  --   --  0.38  --   CREATININE  --   --   --  1.09  --   --  1.12  --   --  0.96  TROPONINI  --  0.38* 0.38*  --  0.31*  --   --   --   --   --     Estimated Creatinine Clearance: 126 ml/min (by C-G formula based on Cr of 0.96).     Assessment: 10 YOM with Afib and positive troponin to continue on IV heparin.  Heparin level therapeutic.  Patient's hemoglobin and platelets are decreasing and he has hematuria.   Goal of Therapy:  Heparin level 0.3-0.7 units/ml Monitor platelets by anticoagulation protocol: Yes    Plan:  - Heparin gtt at 1550 units/hr, f/u plan - Daily HL / CBC - Vanc 1500mg  IV Q12H and Zosyn 3.375gm IV Q8H, 4 hr infusion    Javohn Basey D. Mina Marble, PharmD, BCPS Pager:  [phone] 05/02/2013, 8:46 AM

## 2013-05-02 NOTE — Procedures (Signed)
Intubation Procedure Note Don Brown [id number] [birth year]  Procedure: Intubation Indications: Respiratory insufficiency  Procedure Details Consent: Unable to obtain consent because of emergent medical necessity. Time Out: Verified patient identification, verified procedure, site/side was marked, verified correct patient position, special equipment/implants available, medications/allergies/relevent history reviewed, required imaging and test results available.  Performed  Maximum sterile technique was used including antiseptics, cap, gloves, gown, hand hygiene and mask.  MAC 4 glide scope     Evaluation Hemodynamic Status: BP stable throughout; O2 sats: stable throughout Patient's Current Condition: stable Complications: No apparent complications Patient did tolerate procedure well. Chest X-ray ordered to verify placement.  CXR: pending.   Don Brown,PETE 05/02/2013

## 2013-05-02 NOTE — Progress Notes (Signed)
NUTRITION FOLLOW UP  Intervention:    Increase Vital High Protein goal rate to 70 ml/h (1680 ml per day) with Prostat 30 ml TID to provide 1980 kcals (24 kcals/kg ideal weight), 192 gm protein, 1404 ml free water daily.  Nutrition Dx:   Inadequate oral intake related to inability to eat as evidenced by NPO status. Ongoing.  Goal:   Enteral nutrition to provide 60-70% of estimated calorie needs (22-25 kcals/kg ideal body weight) and 100% of estimated protein needs, based on ASPEN guidelines for permissive underfeeding in critically ill obese individuals. Unmet.  Monitor:   TF tolerance/adequacy, weight trend, labs, vent status.  Assessment:   58 yo M with known chronic pancreatitis, AFib, HF, obesity, HTN, HLD, coming in with acute agitation and fever, has HHS + elevated lactate. Concern for possible DT vs intraabdominal process (pancreatitis vs infection vs bowel ischemia).   Patient is currently intubated on ventilator support.  MV: 11.4 L/min Temp (24hrs), Avg:100 F (37.8 C), Min:99.8 F (37.7 C), Max:100.5 F (38.1 C)  Propofol: off since this morning.   Spoke with RN regarding TF. TF was increased to previous goal rate of 40 ml/h yesterday, then decreased to new goal rate of 10 ml/h at 6 AM today. RD has re-estimated nutrition needs now that Propofol has been discontinued.   Currently receiving Vital High Protein at 10 ml/h (240 ml per day) with Prostat 60 ml QID to provide 1040 kcals, 141 gm protein, 201 ml free water daily.   Height: Ht Readings from Last 1 Encounters:  05/01/13 6' 0.05" (1.83 m)    Weight Status:   Wt Readings from Last 1 Encounters:  05/02/13 328 lb 7.8 oz (149 kg)    Re-estimated needs:  Kcal: 2760 Protein: 165-200 gm Fluid: 2.6-2.8 L  Skin: no wounds  Diet Order: NPO   Intake/Output Summary (Last 24 hours) at 05/02/13 1518 Last data filed at 05/02/13 1316  Gross per 24 hour  Intake 3237.92 ml  Output   3265 ml  Net -27.08 ml     Last BM: None documented since admission   Labs:   Recent Labs Lab 05/01/13 1249 05/01/13 1710 05/01/13 2000 05/02/13 0002 05/02/13 0426 05/02/13 1215  NA  --  144 147  --  146  --   K  --  3.2* 4.0  --  3.7  --   CL  --  113* 116*  --  113*  --   CO2  --  20 19  --  17*  --   BUN  --  14 16  --  15  --   CREATININE  --  1.09 1.12  --  0.96  --   CALCIUM  --  7.6* 7.5*  --  8.4  --   MG 2.0  --   --  2.0  --  2.0  PHOS 3.5  --   --  2.1*  --  2.2*  GLUCOSE  --  155* 237*  --  323*  --     CBG (last 3)   Recent Labs  05/02/13 0018 05/02/13 0348 05/02/13 1131  GLUCAP 237* 263* 285*    Scheduled Meds: . antiseptic oral rinse  15 mL Mouth Rinse QID  . aspirin  325 mg Oral Daily  . chlorhexidine  15 mL Mouth Rinse BID  . diltiazem  60 mg Oral Q6H  . feeding supplement (PRO-STAT SUGAR FREE 64)  60 mL Per Tube QID  . feeding supplement (VITAL  HIGH PROTEIN)  1,000 mL Per Tube Q24H  . [START ON 7/34/1937] folic acid  1 mg Oral Daily  . furosemide  40 mg Intravenous Q6H  . insulin aspart  0-15 Units Subcutaneous Q4H  . insulin aspart  3 Units Subcutaneous Q4H  . insulin glargine  25 Units Subcutaneous Daily  . midazolam  2 mg Intravenous Once  . midazolam  4 mg Intravenous Once  . [START ON 05/03/2013] multivitamin  5 mL Oral Daily  . pantoprazole sodium  40 mg Per Tube Daily  . piperacillin-tazobactam (ZOSYN)  IV  3.375 g Intravenous Q8H  . potassium phosphate IVPB (mmol)  10 mmol Intravenous Once  . [START ON 05/03/2013] thiamine  100 mg Oral Daily  . vancomycin  1,500 mg Intravenous Q12H    Continuous Infusions: . dextrose    . fentaNYL infusion INTRAVENOUS    . midazolam (VERSED) infusion 2 mg/hr (05/02/13 1511)    Molli Barrows, RD, [place], [place] Pager [phone] After Hours Pager [phone]

## 2013-05-02 NOTE — Progress Notes (Signed)
[facility] Progress Note Patient Name: Devonn Giampietro DOB: [birth year] MRN: [id number]  Date of Service  05/02/2013   HPI/Events of Note  Foley not draining properly. Appears to be obstructed  eICU Interventions     Intervention Category Major Interventions: Other: foley cath exchange  Ryler Laskowski 05/02/2013, 12:29 AM

## 2013-05-02 NOTE — Progress Notes (Signed)
Pulled back ET tube 2cm. ET tube now at 25.

## 2013-05-02 NOTE — Progress Notes (Signed)
Reported increase heart rate to MD Simmonds. Awaiting orders- will continue to monitor patient.

## 2013-05-02 NOTE — Progress Notes (Signed)
Inpatient Diabetes Program Recommendations  AACE/ADA: New Consensus Statement on Inpatient Glycemic Control (2013)  Target Ranges:  Prepandial:   less than 140 mg/dL      Peak postprandial:   less than 180 mg/dL (1-2 hours)      Critically ill patients:  140 - 180 mg/dL   Reason for Visit: Hyperglycemia.    Diabetes history: No previous history of diabetes noted, however A1C=12.5% indicating average CBG's 315 mg/dL in past 2-3 months.  Current orders for Inpatient glycemic control: Moderate correction q 4 hours, Novolog tube feed coverage 3 units q 4 hours, and Lantus 10 units q HS   Discussed with Dr. Nelda Marseille.  Based on patient's weight and insulin drip rates on 05/01/13, patient will likely need more Lantus.  He did not receive any Lantus when transitioned off insulin drip last PM and CBG back up to the 300's.  May also need to recheck BMET this afternoon.  Orders received for Lantus 25 units q AM to be given this morning.  Adah Perl, RN, BC-ADM Inpatient Diabetes Coordinator Pager [phone]

## 2013-05-02 NOTE — Progress Notes (Signed)
[facility] Electrolyte Replacement Protocol  Patient Name: Don Brown DOB: Oct 06, 1955 MRN: [id number]  Date of Service  05/02/2013   HPI/Events of Note    Recent Labs Lab 04/30/13 2337 05/01/13 0131 05/01/13 0304 05/01/13 0504 05/01/13 1249 05/01/13 1710 05/01/13 2000 05/02/13 0002 05/02/13 0426  NA 136* 138 151* 151*  --  144 147  --  146  K 4.4 4.5 4.1 3.9  --  3.2* 4.0  --  3.7  CL 97 100 118* 118*  --  113* 116*  --  113*  CO2 '21 21 21 21  ' --  20 19  --  17*  GLUCOSE 803* 679* 137* 138*  --  155* 237*  --  323*  BUN '21 19 18 18  ' --  14 16  --  15  CREATININE 1.11 1.04 1.44* 1.44*  --  1.09 1.12  --  0.96  CALCIUM 8.7 8.4 7.7* 7.7*  --  7.6* 7.5*  --  8.4  MG  --  2.3  --  2.3 2.0  --   --  2.0  --   PHOS  --  3.7  --  2.9 3.5  --   --  2.1*  --     Estimated Creatinine Clearance: 126 ml/min (by C-G formula based on Cr of 0.96).  Intake/Output     02/09 0701 - 02/10 0700   P.O. 800   I.V. (mL/kg) 2212.4 (14.8)   NG/GT 275.4   IV Piggyback 650   Total Intake(mL/kg) 3937.8 (26.4)   Urine (mL/kg/hr) 1650 (0.5)   Total Output 1650   Net +2287.8       Urine Occurrence 1 x    - I/O DETAILED x24h    Total I/O In: 954 [I.V.:629; NG/GT:225; IV Piggyback:100] Out: 865 [Urine:865] - I/O THIS SHIFT    ASSESSMENT   eICURN Interventions   Electrolyte protocol criteria not met. Value not replaced per protocol. MD notified.   ASSESSMENT: MAJOR ELECTROLYTE    [name] 05/02/2013, 5:50 AM

## 2013-05-03 ENCOUNTER — Inpatient Hospital Stay (HOSPITAL_COMMUNITY): Payer: Non-veteran care

## 2013-05-03 DIAGNOSIS — D72819 Decreased white blood cell count, unspecified: Secondary | ICD-10-CM

## 2013-05-03 DIAGNOSIS — D696 Thrombocytopenia, unspecified: Secondary | ICD-10-CM

## 2013-05-03 DIAGNOSIS — R509 Fever, unspecified: Secondary | ICD-10-CM | POA: Diagnosis present

## 2013-05-03 DIAGNOSIS — D472 Monoclonal gammopathy: Secondary | ICD-10-CM

## 2013-05-03 LAB — CBC
HCT: 38.4 % — ABNORMAL LOW (ref 39.0–52.0)
Hemoglobin: 12.2 g/dL — ABNORMAL LOW (ref 13.0–17.0)
MCH: 27.7 pg (ref 26.0–34.0)
MCHC: 31.8 g/dL (ref 30.0–36.0)
MCV: 87.3 fL (ref 78.0–100.0)
PLATELETS: 86 10*3/uL — AB (ref 150–400)
RBC: 4.4 MIL/uL (ref 4.22–5.81)
RDW: 16.6 % — ABNORMAL HIGH (ref 11.5–15.5)
WBC: 4.7 10*3/uL (ref 4.0–10.5)

## 2013-05-03 LAB — BASIC METABOLIC PANEL
BUN: 20 mg/dL (ref 6–23)
CALCIUM: 8.3 mg/dL — AB (ref 8.4–10.5)
CO2: 21 mEq/L (ref 19–32)
Chloride: 117 mEq/L — ABNORMAL HIGH (ref 96–112)
Creatinine, Ser: 1.27 mg/dL (ref 0.50–1.35)
GFR calc Af Amer: 70 mL/min — ABNORMAL LOW (ref >90)
GFR calc non Af Amer: 61 mL/min — ABNORMAL LOW (ref >90)
GLUCOSE: 181 mg/dL — AB (ref 70–99)
Potassium: 3.4 mEq/L — ABNORMAL LOW (ref 3.7–5.3)
SODIUM: 148 meq/L — AB (ref 137–147)

## 2013-05-03 LAB — URINALYSIS, ROUTINE W REFLEX MICROSCOPIC
Bilirubin Urine: NEGATIVE
GLUCOSE, UA: NEGATIVE mg/dL
Ketones, ur: NEGATIVE mg/dL
Nitrite: NEGATIVE
PROTEIN: 100 mg/dL — AB
Specific Gravity, Urine: 1.035 — ABNORMAL HIGH (ref 1.005–1.030)
Urobilinogen, UA: 0.2 mg/dL (ref 0.0–1.0)
pH: 5.5 (ref 5.0–8.0)

## 2013-05-03 LAB — URINE MICROSCOPIC-ADD ON

## 2013-05-03 LAB — GLUCOSE, CAPILLARY
GLUCOSE-CAPILLARY: 202 mg/dL — AB (ref 70–99)
GLUCOSE-CAPILLARY: 230 mg/dL — AB (ref 70–99)
GLUCOSE-CAPILLARY: 257 mg/dL — AB (ref 70–99)
Glucose-Capillary: 143 mg/dL — ABNORMAL HIGH (ref 70–99)
Glucose-Capillary: 231 mg/dL — ABNORMAL HIGH (ref 70–99)
Glucose-Capillary: 321 mg/dL — ABNORMAL HIGH (ref 70–99)

## 2013-05-03 LAB — POCT I-STAT 3, ART BLOOD GAS (G3+)
Acid-base deficit: 4 mmol/L — ABNORMAL HIGH (ref 0.0–2.0)
Bicarbonate: 21.6 mEq/L (ref 20.0–24.0)
O2 Saturation: 92 %
PH ART: 7.335 — AB (ref 7.350–7.450)
Patient temperature: 98.6
TCO2: 23 mmol/L (ref 0–100)
pCO2 arterial: 40.6 mmHg (ref 35.0–45.0)
pO2, Arterial: 66 mmHg — ABNORMAL LOW (ref 80.0–100.0)

## 2013-05-03 LAB — SAVE SMEAR

## 2013-05-03 LAB — MAGNESIUM
MAGNESIUM: 1.7 mg/dL (ref 1.5–2.5)
MAGNESIUM: 1.8 mg/dL (ref 1.5–2.5)

## 2013-05-03 LAB — PHOSPHORUS
PHOSPHORUS: 3.4 mg/dL (ref 2.3–4.6)
Phosphorus: 4.5 mg/dL (ref 2.3–4.6)

## 2013-05-03 MED ORDER — METOPROLOL TARTRATE 1 MG/ML IV SOLN
INTRAVENOUS | Status: AC
  Administered 2013-05-03: 01:00:00
  Filled 2013-05-03: qty 5

## 2013-05-03 MED ORDER — POTASSIUM CHLORIDE 20 MEQ/15ML (10%) PO LIQD
40.0000 meq | Freq: Three times a day (TID) | ORAL | Status: AC
  Administered 2013-05-03 (×2): 40 meq
  Filled 2013-05-03 (×2): qty 30

## 2013-05-03 MED ORDER — POTASSIUM CHLORIDE 20 MEQ/15ML (10%) PO LIQD
ORAL | Status: AC
  Filled 2013-05-03: qty 15

## 2013-05-03 MED ORDER — POTASSIUM CHLORIDE 20 MEQ/15ML (10%) PO LIQD
40.0000 meq | Freq: Once | ORAL | Status: AC
  Administered 2013-05-03: 40 meq via ORAL
  Filled 2013-05-03: qty 30

## 2013-05-03 MED ORDER — VITAL HIGH PROTEIN PO LIQD
1000.0000 mL | ORAL | Status: DC
  Administered 2013-05-03: 1000 mL
  Filled 2013-05-03 (×6): qty 1000

## 2013-05-03 MED ORDER — FREE WATER
300.0000 mL | Freq: Four times a day (QID) | Status: DC
  Administered 2013-05-03 – 2013-05-05 (×8): 300 mL

## 2013-05-03 MED ORDER — INSULIN GLARGINE 100 UNIT/ML ~~LOC~~ SOLN
35.0000 [IU] | Freq: Every day | SUBCUTANEOUS | Status: DC
  Administered 2013-05-03: 35 [IU] via SUBCUTANEOUS
  Filled 2013-05-03 (×2): qty 0.35

## 2013-05-03 MED ORDER — FUROSEMIDE 10 MG/ML IJ SOLN
40.0000 mg | Freq: Four times a day (QID) | INTRAMUSCULAR | Status: AC
  Administered 2013-05-03 – 2013-05-04 (×3): 40 mg via INTRAVENOUS
  Filled 2013-05-03 (×3): qty 4

## 2013-05-03 NOTE — Progress Notes (Signed)
Patient ID: Don Brown, male   DOB: 1955-06-18, 58 y.o.   MRN: [id number]         [facility]    Date of Admission:  04/30/2013           Day 3 vancomycin        Day 3 piperacillin tazobactam       Reason for Consult: Persistent, unexplained fever    Referring Physician: Dr. Ellin Goodie   Principal Problem:   Fever Active Problems:   Acute respiratory failure   Atrial fibrillation   CHF (congestive heart failure)   Nonischemic cardiomyopathy   Hyperosmolar non-ketotic state in patient with type 2 diabetes mellitus   Lactic acidosis   Delirium, acute   Chronic pancreatitis   . antiseptic oral rinse  15 mL Mouth Rinse QID  . aspirin  325 mg Oral Daily  . chlorhexidine  15 mL Mouth Rinse BID  . diltiazem  60 mg Oral 4 times per day  . feeding supplement (PRO-STAT SUGAR FREE 64)  30 mL Per Tube TID  . folic acid  1 mg Oral Daily  . free water  300 mL Per Tube Q6H  . furosemide  40 mg Intravenous Q6H  . insulin aspart  0-15 Units Subcutaneous 6 times per day  . insulin aspart  3 Units Subcutaneous 6 times per day  . insulin glargine  35 Units Subcutaneous Daily  . midazolam  2 mg Intravenous Once  . midazolam  4 mg Intravenous Once  . multivitamin  5 mL Oral Daily  . pantoprazole sodium  40 mg Per Tube Daily  . piperacillin-tazobactam (ZOSYN)  IV  3.375 g Intravenous 3 times per day  . potassium chloride  40 mEq Per Tube TID  . thiamine  100 mg Oral Daily  . vancomycin  1,500 mg Intravenous Q12H    Recommendations: 1. Continue current antibiotics 2. Check viral respiratory panel 3. Droplet proportions   Assessment: The source of his fevers remain unclear but I suspect that he has pneumonia. He does have some faint infiltrates and a moderately purulent secretions. I will continue current empiric antibacterial therapy and check respiratory viral panel. Influenza is certainly a possibility.    HPI: Don Brown is a 58 y.o. male with morbid  obesity, poorly controlled diabetes, nonischemic cardiomyopathy, history of chronic pancreatitis, pancytopenia and a monoclonal gammopathy of unknown significance. He was admitted 3 days ago with acute fever to 102, delirium, severe hyperglycemia, lactic acidosis and acute respiratory failure. He was intubated and started on broad antibacterial therapy. So far blood cultures are negative.   Review of Systems: Review of systems not obtained due to patient factors.  Past Medical History  Diagnosis Date  . 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 Atrial fibrillation   . CHF (congestive heart failure)     Chronic systolic heart failure with an ejection fraction of 40%- 45%  . Morbid obesity   . Chronic pain   . Narcotic dependence, episodic use     chronic narcotic use  . Respiratory failure     History of ventilatory-dependent respiratory failure and tracheostomy   . History of renal failure     requiring hemodialysis in the past with normal renal function at this point  . History of ventral hernia repair   . Cervical compression fracture  Cervical disk disease requiring surgery  . Hypertension   . Hyperlipidemia   . Degenerative joint disease   . Heart murmur     History  Substance Use Topics  . Smoking status: Never Smoker   . Smokeless tobacco: Not on file  . Alcohol Use: No    Family History  Problem Relation Age of Onset  . Hypertension Mother   . Diabetes Mother   . Lymphoma Father    Allergies  Allergen Reactions  . Morphine And Related Nausea And Vomiting    OBJECTIVE: Blood pressure 140/77, pulse 111, temperature 100 F (37.8 C), temperature source Core (Comment), resp. rate 20, height 6' 0.05" (1.83 m), weight 147.4 kg (324 lb 15.3 oz), SpO2 95.00%. General: He is sedated on the ventilator. He is morbidly obese Skin: No  rash. He has peripheral IVs in his left and right arms and a right IJ central line Lungs: Clear anteriorly Cor: Distant but regular S1 and S2 with no murmurs Abdomen: Obese, soft and nontender. He has a large, healed transverse surgical incision Joints and extremities: No acute abnormalities  Lab Results Lab Results  Component Value Date   WBC 4.7 05/03/2013   HGB 12.2* 05/03/2013   HCT 38.4* 05/03/2013   MCV 87.3 05/03/2013   PLT 86* 05/03/2013    Lab Results  Component Value Date   CREATININE 1.27 05/03/2013   BUN 20 05/03/2013   NA 148* 05/03/2013   K 3.4* 05/03/2013   CL 117* 05/03/2013   CO2 21 05/03/2013    Lab Results  Component Value Date   ALT 13 05/01/2013   AST 14 05/01/2013   ALKPHOS 76 05/01/2013   BILITOT 0.4 05/01/2013     Microbiology: Recent Results (from the past 240 hour(s))  CULTURE, BLOOD (ROUTINE X 2)     Status: None   Collection Time    04/30/13 11:37 PM      Result Value Ref Range Status   Specimen Description BLOOD RIGHT HAND   Final   Special Requests BOTTLES DRAWN AEROBIC ONLY [facility]   Final   Culture  Setup Time     Final   Value: 05/01/2013 08:20     Performed at [facility]   Culture     Final   Value:        BLOOD CULTURE RECEIVED NO GROWTH TO DATE CULTURE WILL BE HELD FOR 5 DAYS BEFORE ISSUING A FINAL NEGATIVE REPORT     Performed at [facility]   Report Status PENDING   Incomplete  CULTURE, BLOOD (ROUTINE X 2)     Status: None   Collection Time    04/30/13 11:47 PM      Result Value Ref Range Status   Specimen Description BLOOD LEFT HAND   Final   Special Requests BOTTLES DRAWN AEROBIC AND ANAEROBIC 5CC EACH   Final   Culture  Setup Time     Final   Value: 05/01/2013 08:20     Performed at [facility]   Culture     Final   Value:        BLOOD CULTURE RECEIVED NO GROWTH TO DATE CULTURE WILL BE HELD FOR 5 DAYS BEFORE ISSUING A FINAL NEGATIVE REPORT     Performed at [facility]   Report Status PENDING   Incomplete   MRSA PCR SCREENING     Status: None   Collection Time    05/01/13  6:16 AM  Result Value Ref Range Status   MRSA by PCR NEGATIVE  NEGATIVE Final   Comment:            The GeneXpert MRSA Assay (FDA     approved for NASAL specimens     only), is one component of a     comprehensive MRSA colonization     surveillance program. It is not     intended to diagnose MRSA     infection nor to guide or     monitor treatment for     MRSA infections.    Michel Bickers, MD [facility] [phone] pager   [phone] cell 05/03/2013, 1:10 PM

## 2013-05-03 NOTE — Progress Notes (Signed)
Patients daughter and main point of contact, Joelene Millin, notified of patient self extubation, reintubation and necessary steps staff had to take for patients physical safety.  Patient's family deemed staff interventions necessary, no further questions or issues with decisions made.

## 2013-05-03 NOTE — Procedures (Signed)
This intubation was done 05/02/13 around 11.50pm. Glidescope was used. I was with NP at bedside for entire procedure.   Dr. Brand Males, M.D., [facility].C.P Pulmonary and Critical Care Medicine Staff Physician [facility] Pager: [phone], If no answer or between  15:00h - 7:00h: call [phone]  05/03/2013 12:01 AM

## 2013-05-03 NOTE — Progress Notes (Signed)
At approximately 11:50 pm on 05/02/2013 patient self extubated by thrashing in the bed enough to dislodge ETT. Simonds MD instructed for this RN to place patient on NRB, patient unable to keep O2 saturations up with this method. Patient bagged by RT for approximately 3 minutes and given 2 mg narcan. Narcan successful, but patient still unable to protect own airway. Reinsertion of ETT performed by Ann Lions MD and Jerrye Bushy NP.  30mg  Etomidate and 100 mg Rocuronium utilized for ETT insertion. Post procedure patient tachycardic between 130-170, 5 mg Metoprolol ordered by Marni Griffon NP. This successfully brought patients heart rate down to <100. Patient stable and continued to be sedated with fentanyl and versed after procedure.

## 2013-05-03 NOTE — Progress Notes (Signed)
Placed on droplet precaution pr Dr Hale Bogus order to r/o flu. No family present at this time. Will informed family when they visit. Specimen sent to lab for flu pcr.

## 2013-05-03 NOTE — ED Provider Notes (Signed)
Medical screening examination/treatment/procedure(s) were conducted as a shared visit with resident physician and myself.  I personally evaluated the patient during the encounter.  I interviewed and examined the patient. Lungs w/ dec BS diffusely, rales in the bases, tachypnea. Cardiac exam wnl, pt is tachycardic. Abdomen mildly distended w/ mild non-focal ttp. GCS of 10 requiring chemical restraint and close monitoring of airway although pt is currently protecting. Broad spectrum abx initiated d/t presentation w/ fever. CT neg. AMS likely d/t metabolic abnormalities. CC consulted for admission.   CRITICAL CARE Performed by: Pamella Pert, S Total critical care time: 40 min Critical care time was exclusive of separately billable procedures and treating other patients. Critical care was necessary to treat or prevent imminent or life-threatening deterioration. Critical care was time spent personally by me on the following activities: development of treatment plan with patient and/or surrogate as well as nursing, discussions with consultants, evaluation of patient's response to treatment, examination of patient, obtaining history from patient or surrogate, ordering and performing treatments and interventions, ordering and review of laboratory studies, ordering and review of radiographic studies, pulse oximetry and re-evaluation of patient's condition.   Blanchard Kelch, MD 05/03/13 [phone]

## 2013-05-03 NOTE — Progress Notes (Signed)
Patient ID: Don Brown, male   DOB: [birth year], 58 y.o.   MRN: [id number]    Name: Don Brown MRN: [id number] DOB: Jun 10, 1955    ADMISSION DATE:  04/30/2013 REFERRING MD : ED  PRIMARY SERVICE: PCCM  CHIEF COMPLAINT: Altered mental status   BRIEF PATIENT DESCRIPTION: 89 M with known chronic pancreatitis, AFib, HF, obesity, HTN, HLD, coming in with acute agitation and fever, has HHS + elevated lactate. Concern for possible DT vs intraabdominal process (pancreatitis vs infection vs bowel ischemia). Cannot rule out meningitis but less likely (no headache, no nuchal rigidity)   SIGNIFICANT EVENTS / STUDIES:  CT head 04/30/13 negative for acute process  2/8 Neg HIV, Hep B and Hep C  2/9 CT : RLL > LLL opacity, likely atelectasis, ? PNA, liver lesions (cyst), pancreatic cyst (No acute pancreatitis), Gastriris?, Mild adenopathy along the gastrohepatic ligament, no abscess. Below the diaphragm, there is no convincing source for sepsis. No evidence of bowel ischemia. 2/10 Echo: Left ventricle:severely dilated.  LVH. Systolic severely reduced. The EF 20% to 25%. Diffuse hypokinesis.Right atrium: mildly dilated. 2/10 SPEP/UPEP: pending  LINES / TUBES:  Foley 04/30/13 >>>  RIJ 2/9 >>  ETT>> 2/9 >>2/10 pt self extubated ETT replaced 2/10 PIV x2  CULTURES:  2/8 Bld Cx: NGTD  ANTIBIOTICS:  Zosyn 05/01/13 >>>  Vancomycin 05/01/13 >>>   SUBJECTIVE: Pt self extubated last night and re intubated. Currently sedated.  VITAL SIGNS: Temp:  [99.6 F (37.6 C)-101.7 F (38.7 C)] 101.1 F (38.4 C) (02/11 0600) Pulse Rate:  [62-117] 73 (02/11 0600) Resp:  [0-31] 20 (02/11 0600) BP: (86-166)/(48-95) 99/57 mmHg (02/11 0600) SpO2:  [91 %-100 %] 97 % (02/11 0600) FiO2 (%):  [40 %] 40 % (02/11 0344) Weight:  [324 lb 15.3 oz (147.4 kg)] 324 lb 15.3 oz (147.4 kg) (02/11 0500) HEMODYNAMICS: CVP:  [7 mmHg-22 mmHg] 13 mmHg VENTILATOR SETTINGS: Vent Mode:  [-] PRVC FiO2 (%):  [40 %] 40 % Set Rate:  [20 bmp] 20  bmp Vt Set:  [500 mL] 500 mL PEEP:  [5 cmH20] 5 cmH20 Pressure Support:  [10 cmH20] 10 cmH20 Plateau Pressure:  [12 cmH20-20 cmH20] 20 cmH20 INTAKE / OUTPUT: Intake/Output     02/10 0701 - 02/11 0700   P.O. 240   I.V. (mL/kg) 710.8 (4.8)   NG/GT 1096   IV Piggyback 1402   Total Intake(mL/kg) 3448.8 (23.4)   Urine (mL/kg/hr) 3900 (1.1)   Total Output 3900   Net -451.2         PHYSICAL EXAMINATION: General: Sedated. Does not wake to name or pain Neuro: Sedated.  HEENT: No enlarged LN, no stridor, EOM full and equal  Cardiovascular: Irregular, no loud murmur  Lungs: CTAB Abdomen: Soft, no tenderness, no guarding  Musculoskeletal: No leg edema, no clubbing   LABS:  CBC  Recent Labs Lab 05/02/13 0426 05/02/13 1100 05/03/13 0500  WBC 5.2 6.0 4.7  HGB 15.3 13.5 12.2*  HCT 47.3 41.1 38.4*  PLT 94* 106* 86*   Coag's No results found for this basename: APTT, INR,  in the last 168 hours BMET  Recent Labs Lab 05/02/13 0426 05/02/13 2000 05/03/13 0500  NA 146 147 148*  K 3.7 4.1 3.4*  CL 113* 117* 117*  CO2 17* 19 21  BUN '15 19 20  ' CREATININE 0.96 1.29 1.27  GLUCOSE 323* 297* 181*   Electrolytes  Recent Labs Lab 05/02/13 0002 05/02/13 0426 05/02/13 1215 05/02/13 2000 05/03/13 0057 05/03/13 0500  CALCIUM  --  8.4  --  8.1*  --  8.3*  MG 2.0  --  2.0  --  1.7  --   PHOS 2.1*  --  2.2*  --  4.5  --    Sepsis Markers  Recent Labs Lab 05/01/13 0500 05/01/13 0830 05/01/13 1300  LATICACIDVEN 1.9 0.9 1.1   ABG  Recent Labs Lab 05/01/13 1813 05/02/13 1448 05/03/13 0357  PHART 7.313* 7.372 7.335*  PCO2ART 40.5 32.9* 40.6  PO2ART 90.0 131.0* 66.0*   Liver Enzymes  Recent Labs Lab 04/30/13 2127 05/01/13 0504  AST 24 14  ALT 20 13  ALKPHOS 127* 76  BILITOT 0.4 0.4  ALBUMIN 3.5 2.8*   Cardiac Enzymes  Recent Labs Lab 05/01/13 1100 05/01/13 1243 05/01/13 1843  TROPONINI 0.38* 0.38* 0.31*   Glucose  Recent Labs Lab 05/02/13 0816  05/02/13 1131 05/02/13 1535 05/02/13 1905 05/03/13 0027 05/03/13 0443  GLUCAP 277* 285* 373* 278* 257* 202*   A1c 12.5   Imaging Ct Abdomen Pelvis W Contrast  05/01/2013   CLINICAL DATA:  Evaluate for possible abdominal infection. Looking for source of sepsis.  EXAM: CT ABDOMEN AND PELVIS WITH CONTRAST  TECHNIQUE: Multidetector CT imaging of the abdomen and pelvis was performed using the standard protocol following bolus administration of intravenous contrast.  CONTRAST:  120m OMNIPAQUE IOHEXOL 350 MG/ML SOLN  COMPARISON:  01/05/2011.  FINDINGS: There is confluent opacity in the right lower lobe at the posterior lung base. This is most likely atelectasis. There is a smaller area similar opacity at the left posterior medial lung base. There are adjacent coarse reticular opacities likely additional subsegmental atelectasis. The heart is mildly enlarged. Infiltrate at either lung base is possible.  There are 2 small low-density lesions in the lateral segment of the left lobe of the liver, with a third in the posterior medial right upper lobe and a fourth from the caudate lobe these were present previously. There are most likely cysts. There is another round cystic lesion lies along the gallbladder fossa. This may simply reflect gallbladder. It is stable. There is some generalized increased attenuation from the caudate lobe.  The spleen is unremarkable. No bile duct dilation. There is irregular dilation of the pancreatic duct leading to a and irregular cystic appearance throughout pancreatic tail. This is stable. No discrete pancreatic mass or inflammatory changes seen.  No adrenal masses. Kidneys and ureters are unremarkable. The bladder is decompressed by a Foley catheter.  There are mildly enlarged gastrohepatic ligament lymph nodes, largest measuring 15 mm in short axis. There are mildly prominent retroperitoneal lymph nodes none of which are pathologically enlarged by size criteria. There is no  ascites.  The colon and small bowel are unremarkable. A normal size appendix is seen.  There are prominent, tortuous vessels along the anterior and greater curvature region of the stomach. Nasogastric tube lies in the distal stomach. There is slight inflammatory type stranding adjacent to the stomach particularly the antrum and pylorus region. This could reflect gastritis.  There are degenerative changes along the visible spine. No osteoblastic or osteolytic lesions. No CT evidence of discitis.  IMPRESSION: 1. There are multiple abnormalities as detailed above. 2. Lung base opacity now right lower lobe greater than left lower lobe bubbles likely atelectasis. Pneumonia is possible. 3. Low-density liver lesions, likely cysts it is stable. 4. Irregular cystic change along the tail the pancreas much of which likely a tortuous dilated duct. This suggests chronic pancreatitis. There is no convincing acute pancreatitis. 5. There  is prominent vascularity adjacent to the stomach with subtle hazy opacity in the fat adjacent to the gastric antrum and pylorus. Consider gastritis in the proper clinical setting. 6. Mild adenopathy along the gastrohepatic ligament. 7. There is no evidence of an abscess. Below the diaphragm, there is no convincing source for sepsis. No evidence of bowel ischemia.   Electronically Signed   By: Lajean Manes M.D.   On: 05/01/2013 15:33   Dg Chest Port 1 View  05/03/2013   CLINICAL DATA:  Assess endotracheal tube placement  EXAM: PORTABLE CHEST - 1 VIEW  COMPARISON:  May 02, 2013 8 a.m.  FINDINGS: The heart size and mediastinal contours are stable. The heart size is enlarged. The aorta is tortuous. Endotracheal tube is seen with distal tip 3.9 cm from carina. There is no pneumothorax. Right jugular central venous line is unchanged. Both lungs are clear. The visualized skeletal structures are unremarkable.  IMPRESSION: Endotracheal tube distal tip 3.9 cm from carina. There is no pneumothorax.  Cardiomegaly.   Electronically Signed   By: Abelardo Diesel M.D.   On: 05/03/2013 00:23   Dg Chest Port 1 View  05/02/2013   CLINICAL DATA:  Dyspnea  EXAM: PORTABLE CHEST - 1 VIEW  COMPARISON:  May 01, 2013  FINDINGS: Endotracheal tube tip is 8 mm above the carinal. Nasogastric tube tip and side port are below the diaphragm. Central catheter tip is at the cavoatrial junction. No pneumothorax.  There is mild atelectasis in the left base. The lungs are otherwise clear. Heart is enlarged with normal pulmonary vascularity, stable. No adenopathy.  IMPRESSION: Endotracheal tube tip is less than 1 cm above the carina. Consider withdrawal approximately 3 cm. No edema or consolidation. No pneumothorax.   Electronically Signed   By: Lowella Grip M.D.   On: 05/02/2013 08:37   Dg Chest Port 1 View  05/01/2013   CLINICAL DATA:  ET tube placement  EXAM: PORTABLE CHEST - 1 VIEW  COMPARISON:  05/01/2013  FINDINGS: Endotracheal tube tip projects 2 cm above the chronic, well positioned.  New gastric tube extends below the diaphragm into the stomach.  No change in cardiomegaly or in the appearance of the lungs. Stable right internal jugular central venous line, well positioned.  IMPRESSION: Endotracheal tube and gastric tube are well positioned. No change from the earlier study.   Electronically Signed   By: Lajean Manes M.D.   On: 05/01/2013 12:03   Dg Abd Portable 1v  05/03/2013   CLINICAL DATA:  Nasogastric tube placement  EXAM: PORTABLE ABDOMEN - 1 VIEW  COMPARISON:  None.  FINDINGS: Nasogastric tube is identified with distal tip in the proximal to mid stomach. The stomach is air distended.  IMPRESSION: Nasogastric tube is identified with distal tip in the proximal to mid stomach.   Electronically Signed   By: Abelardo Diesel M.D.   On: 05/03/2013 00:41    CXR:   ASSESSMENT / PLAN:  PULMONARY  A: combined metabolic and resp acidosis, pH seems to be getting better with current intervention.  Unable to protect  airway.      Intubated 2/9, then re-intubated 2/10 d/t self extubation  P:  - Re-intubated 2/10 after self extubation. - Begin PS trials.  - CXR 2/10: ETT 3.9 above carina, lungs clear - ABG 2/10: Compensated Metabolic acidosis - Adjust vent to ABG. - Lasix 40 mg IV x3 started yesterday, with 3900 UOP, cxr with still with mild cephalization. Repeat lasix today.  CARDIOVASCULAR  A:  AFib     2/9 Echo: Left ventricle:severely dilated.  LVH. Systolic severely reduced. The EF 20% to 25%. Diffuse hypokinesis.Right atrium:         mildly dilated. Has ST-T wave inversion on lateral leads, which are new compared to 2012, possibly rate-related  A-fib with RVR now [place] score 2 P:  - HR , controlled on cardizem 60 mg PO QID with holding parameters and sedation - IVF KVO.  RENAL  A: Mild AKI, resolving after IVF      Hematuria, after hep start 2/9>> resolved with hep dc P:  - KVO IVF.  - Hyperchloremia mild, mild hypernatremia stable - Cr normal after lasix use  - Potassium replacement. - Lasix as ordered.  GASTROINTESTINAL  A: Question of possible pancreatitis vs intraabdominal infection vs bowel ischemia, ruled out with CT and normal Panc enzymes, normal lactic acid P:  - Currently on broad spctrum abx. - PPI IV.  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 P:  - KVO NS and d/c banana bag. - Hematuria resolved with dc of hep 2/10, Hb 15.3 >>12.2, continue monitor. - Spep, Upep pending - Scds. - H/O consult.  INFECTIOUS  A: Seems to be less likely as a cause of fever but possibilities are: intraabdominal infection vs meningitis  P:  - Currently on Zosyn + vanco.  - CT ABD/Pelvis Head report above - Bld Cx: NGTD - ID consult.  ENDOCRINE  A: Has HHS      Diabetes mellitus, 2/8 A1c 12.5  P:  - Monitor electrolytes. Replace potasium today - SSI  - K, Mg, phos all WNL - CBG: elevated 202- 278 - Increase Lantus from 25 units to 35u this  morning  NEUROLOGIC  A: Acute delirium, most likely cause metabolic encephalopathy (HHS vs DT). Less likely meningitis.  P:  - Fentanyl, versed for sedation - Thiamine and folate started  - Broadly covered with abx for now. CT head normal.   Raoul Pitch, Renee DO PGY-2  TODAY'S SUMMARY: will continue diureses today, start PS trials, continue to hold heparin, H/O consult and ID consult to be called today.  Continue abx.    CC time 35 min.  Patient seen and examined, agree with above note.  I dictated the care and orders written for this patient under my direction.  Rush Farmer, M.D. [facility] Pulmonary/Critical Care Medicine. Pager: [phone]. After hours pager: [phone].

## 2013-05-03 NOTE — Progress Notes (Signed)
05/03/2013  9:41 PM   24 hr urine collection started at 2130  [place]

## 2013-05-03 NOTE — Consult Note (Signed)
[place]  Telephone:(336) [place] NOTE  Don Brown                                MR#: [id number]  DOB: 1955-06-25                       CSN#: [id number]  Referring MD:  [facility]  Primary MD: Dr. [facility] in [place]    Reason for Consult: r/o Multiple Myeloma  1. History of  Iron-deficiency anemia. resolved  2. Mild leukopenia, and thrombocytopenia.  3. 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 IgG level of 4200. M-Spike 2.39.   Prior work up:  [name] showed 5% plasma cells, done on 05/28/2011.  Bone survey in 03/2011 is negative (the femur lesion was not seen on Ct scan and likely benign)    Don Brown is a 58 y.o.   male asked to see to rule out multiple myeloma. He was admitted on 05/01/2013 with acute agitation and fever up to 102 F and critical blood sugars at 900; He had lactic acidosis without respiratory compensation.a CT of the head and 04/30/2013 was negative for acute process.he did have some hematuria up to 05/02/2013, which now resolved. Important to mention that as of 05/02/2013, his hemoglobin was 15.3, now at 12.2 , and is being monitored closely clear. After cultures  were obtained, he was placed on Zosyn and vancomycin, and is followed by critical care medicine.to date, these cultures are negative.he was negative for HIV, hepatitis B and hepatitis C. CT of the abdomen and pelvis and 05/01/2013 revealed possible pneumonia, cystic liver lesions, and pancreatic cyst. Questionable gastritis as well.in addition, mild adenopathy along the gastrohepatic ligament without abscess was seen. Below the diaphragm, there is no convincing source of sepsis. No evidence of bowel ischemia.2-D echo on 210 showed a severely dilated left ventricle, severely reduced systolic function, and ejection fraction between 20 and 25%, with diffuse hypokinesis.counts as of today  show a white count of 4.7 hemoglobin 12.2 hematocrit 38.4 MCV 87.3 platelets 86K. Creatinine is 1.27.liver functions are normal  He is intubated at this time.were kindly informed of the patient's admission, rule out the possibility of multiple myeloma. UPEP. SPEP with IFE pending.   PMH:  Past Medical History  Diagnosis Date  . 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 Atrial fibrillation   . CHF (congestive heart failure)     Chronic systolic heart failure with an ejection fraction of 40%- 45%  . Morbid obesity   . Chronic pain   . Narcotic dependence, episodic use     chronic narcotic use  . Respiratory failure     History of ventilatory-dependent respiratory failure and tracheostomy   . History of renal failure     requiring hemodialysis in the past with normal renal function at this point  . History of ventral hernia repair   . Cervical compression fracture     Cervical disk disease requiring surgery  . Hypertension   . Hyperlipidemia   . Degenerative joint disease   . Heart murmur   History of rheumatic fever as a child  Surgeries:  Past Surgical History  Procedure Laterality Date  .  Ventral hernia repair    . Cholecystectomy    . Tracheostomy    . Pancreas surgery    . Pancreatic stents    . Skin graft    . Insertion of mesh      Allergies:  Allergies  Allergen Reactions  . Morphine And Related Nausea And Vomiting    Medications:   Prior to Admission:  . antiseptic oral rinse  15 mL Mouth Rinse QID  . aspirin  325 mg Oral Daily  . chlorhexidine  15 mL Mouth Rinse BID  . diltiazem  60 mg Oral 4 times per day  . feeding supplement (PRO-STAT SUGAR FREE 64)  30 mL Per Tube TID  . folic acid  1 mg Oral Daily  . free water  300 mL Per Tube Q6H  . furosemide  40 mg Intravenous Q6H  . insulin aspart  0-15 Units Subcutaneous 6  times per day  . insulin aspart  3 Units Subcutaneous 6 times per day  . insulin glargine  35 Units Subcutaneous Daily  . midazolam  2 mg Intravenous Once  . midazolam  4 mg Intravenous Once  . multivitamin  5 mL Oral Daily  . pantoprazole sodium  40 mg Per Tube Daily  . piperacillin-tazobactam (ZOSYN)  IV  3.375 g Intravenous 3 times per day  . potassium chloride  40 mEq Per Tube TID  . thiamine  100 mg Oral Daily  . vancomycin  1,500 mg Intravenous Q12H    [name], dextrose, dextrose, fentaNYL, hydrALAZINE, ipratropium-albuterol  ROS:  the patient cannot provide history  Family History:    Family History  Problem Relation Age of Onset  . Hypertension Mother   . Diabetes Mother   . Lymphoma Father      Social History:  reports that he has never smoked. He does not have any smokeless tobacco history on file. He reports that he does not drink alcohol or use illicit drugs. retired from [employer]. Single 2 daughters. Lives in [place].   Physical Exam     Filed Vitals:   05/03/13 1242  BP: 140/77  Pulse: 111  Temp: 100 F (37.8 C)  Resp: 20     Filed Weights   05/01/13 0414 05/02/13 0117 05/03/13 0500  Weight: 316 lb 2.2 oz (143.4 kg) 328 lb 7.8 oz (149 kg) 324 lb 15.3 oz (147.4 kg)    General:  26 -year-old AAM  male   in no acute distress intubated and sedated.  HEENT: Normocephalic, atraumatic, PERRLA. Sclerae anicteric.No periorbital edema. Oral cavity without thrush or lesions.No glossitis. NECK:supple. no thyromegaly, no cervical or supraclavicular adenopathy  LUNGS: clear bilaterally . No wheezing, rhonchi or rales. No axillary masses. BREASTS: not examined. CARDIOVASCULAR: irregular rate and rhythm,no murmur , rubs or gallops ABDOMEN: soft nontender , bowel sounds x4. No HSM. No organomegaly.  GU/rectal: deferred. EXTREMITIES: no clubbing cyanosis or edema. No bruising or petechial rash MUSCULOSKELETAL: no spinal  tenderness.  NEURO: Non Focal. No Horner's.   Labs:    CBC   Recent Labs Lab 04/30/13 2127  05/01/13 0652 05/01/13 2300 05/02/13 0426 05/02/13 1100 05/03/13 0500  WBC 5.1  < > 5.8 3.7* 5.2 6.0 4.7  HGB 15.4  < > 12.9* 12.5* 15.3 13.5 12.2*  HCT 47.7  < > 39.3 38.3* 47.3 41.1 38.4*  PLT 123*  < > 115* 84* 94* 106* 86*  MCV 87.7  < > 84.9 85.9 85.2 85.4 87.3  MCH 28.3  < > 27.9 28.0 27.6 28.1 27.7  MCHC 32.3  < > 32.8 32.6 32.3 32.8 31.8  RDW 14.9  < > 15.0 15.4 15.5 15.8* 16.6*  LYMPHSABS 1.8  --   --   --   --   --   --   MONOABS 0.2  --   --   --   --   --   --   EOSABS 0.1  --   --   --   --   --   --   BASOSABS 0.0  --   --   --   --   --   --   < > = values in this interval not displayed.   CMP    Recent Labs Lab 04/30/13 2127  05/01/13 0504 05/01/13 1249 05/01/13 1710 05/01/13 2000 05/02/13 0002 05/02/13 0426 05/02/13 1215 05/02/13 2000 05/03/13 0057 05/03/13 0500  NA 137  < > 151*  --  144 147  --  146  --  147  --  148*  K 3.9  < > 3.9  --  3.2* 4.0  --  3.7  --  4.1  --  3.4*  CL 95*  < > 118*  --  113* 116*  --  113*  --  117*  --  117*  CO2 18*  < > 21  --  20 19  --  17*  --  19  --  21  GLUCOSE 976*  < > 138*  --  155* 237*  --  323*  --  297*  --  181*  BUN 20  < > 18  --  14 16  --  15  --  19  --  20  CREATININE 1.26  < > 1.44*  --  1.09 1.12  --  0.96  --  1.29  --  1.27  CALCIUM 8.5  < > 7.7*  --  7.6* 7.5*  --  8.4  --  8.1*  --  8.3*  MG  --   < > 2.3 2.0  --   --  2.0  --  2.0  --  1.7  --   AST 24  --  14  --   --   --   --   --   --   --   --   --   ALT 20  --  13  --   --   --   --   --   --   --   --   --   ALKPHOS 127*  --  76  --   --   --   --   --   --   --   --   --   BILITOT 0.4  --  0.4  --   --   --   --   --   --   --   --   --   < > = values in this interval not displayed.   Anemia panel:  No results found for this basename: VITAMINB12, FOLATE, FERRITIN, TIBC, IRON, RETICCTPCT,  in the last 72 hours   Imaging  Studies:  Ct Head Wo Contrast  04/30/2013   CLINICAL DATA:  Altered mental status.  EXAM: CT HEAD WITHOUT CONTRAST  TECHNIQUE: Contiguous axial images were obtained from the base of the skull through the vertex without intravenous contrast.  COMPARISON:  None.  FINDINGS: Mild cerebral  atrophy. No acute intracranial abnormality. Specifically, no hemorrhage, hydrocephalus, mass lesion, acute infarction, or significant intracranial injury. No acute calvarial abnormality. Visualized paranasal sinuses and mastoids clear. Orbital soft tissues unremarkable.  IMPRESSION: No acute intracranial abnormality.  Mild cerebral atrophy.   Electronically Signed   By: Rolm Baptise M.D.   On: 04/30/2013 21:41   Ct Abdomen Pelvis W Contrast  05/01/2013   CLINICAL DATA:  Evaluate for possible abdominal infection. Looking for source of sepsis.  EXAM: CT ABDOMEN AND PELVIS WITH CONTRAST  TECHNIQUE: Multidetector CT imaging of the abdomen and pelvis was performed using the standard protocol following bolus administration of intravenous contrast.  CONTRAST:  170mL OMNIPAQUE IOHEXOL 350 MG/ML SOLN  COMPARISON:  01/05/2011.  FINDINGS: There is confluent opacity in the right lower lobe at the posterior lung base. This is most likely atelectasis. There is a smaller area similar opacity at the left posterior medial lung base. There are adjacent coarse reticular opacities likely additional subsegmental atelectasis. The heart is mildly enlarged. Infiltrate at either lung base is possible.  There are 2 small low-density lesions in the lateral segment of the left lobe of the liver, with a third in the posterior medial right upper lobe and a fourth from the caudate lobe these were present previously. There are most likely cysts. There is another round cystic lesion lies along the gallbladder fossa. This may simply reflect gallbladder. It is stable. There is some generalized increased attenuation from the caudate lobe.  The spleen is unremarkable.  No bile duct dilation. There is irregular dilation of the pancreatic duct leading to a and irregular cystic appearance throughout pancreatic tail. This is stable. No discrete pancreatic mass or inflammatory changes seen.  No adrenal masses. Kidneys and ureters are unremarkable. The bladder is decompressed by a Foley catheter.  There are mildly enlarged gastrohepatic ligament lymph nodes, largest measuring 15 mm in short axis. There are mildly prominent retroperitoneal lymph nodes none of which are pathologically enlarged by size criteria. There is no ascites.  The colon and small bowel are unremarkable. A normal size appendix is seen.  There are prominent, tortuous vessels along the anterior and greater curvature region of the stomach. Nasogastric tube lies in the distal stomach. There is slight inflammatory type stranding adjacent to the stomach particularly the antrum and pylorus region. This could reflect gastritis.  There are degenerative changes along the visible spine. No osteoblastic or osteolytic lesions. No CT evidence of discitis.  IMPRESSION: 1. There are multiple abnormalities as detailed above. 2. Lung base opacity now right lower lobe greater than left lower lobe bubbles likely atelectasis. Pneumonia is possible. 3. Low-density liver lesions, likely cysts it is stable. 4. Irregular cystic change along the tail the pancreas much of which likely a tortuous dilated duct. This suggests chronic pancreatitis. There is no convincing acute pancreatitis. 5. There is prominent vascularity adjacent to the stomach with subtle hazy opacity in the fat adjacent to the gastric antrum and pylorus. Consider gastritis in the proper clinical setting. 6. Mild adenopathy along the gastrohepatic ligament. 7. There is no evidence of an abscess. Below the diaphragm, there is no convincing source for sepsis. No evidence of bowel ischemia.   Electronically Signed   By: Lajean Manes M.D.   On: 05/01/2013 15:33   Dg Chest Port  1 View  05/03/2013   CLINICAL DATA:  Assess endotracheal tube placement  EXAM: PORTABLE CHEST - 1 VIEW  COMPARISON:  May 02, 2013 8 a.m.  FINDINGS: The  heart size and mediastinal contours are stable. The heart size is enlarged. The aorta is tortuous. Endotracheal tube is seen with distal tip 3.9 cm from carina. There is no pneumothorax. Right jugular central venous line is unchanged. Both lungs are clear. The visualized skeletal structures are unremarkable.  IMPRESSION: Endotracheal tube distal tip 3.9 cm from carina. There is no pneumothorax. Cardiomegaly.   Electronically Signed   By: Abelardo Diesel M.D.   On: 05/03/2013 00:23   Dg Chest Port 1 View  05/02/2013   CLINICAL DATA:  Dyspnea  EXAM: PORTABLE CHEST - 1 VIEW  COMPARISON:  May 01, 2013  FINDINGS: Endotracheal tube tip is 8 mm above the carinal. Nasogastric tube tip and side port are below the diaphragm. Central catheter tip is at the cavoatrial junction. No pneumothorax.  There is mild atelectasis in the left base. The lungs are otherwise clear. Heart is enlarged with normal pulmonary vascularity, stable. No adenopathy.  IMPRESSION: Endotracheal tube tip is less than 1 cm above the carina. Consider withdrawal approximately 3 cm. No edema or consolidation. No pneumothorax.   Electronically Signed   By: Lowella Grip M.D.   On: 05/02/2013 08:37   Dg Chest Port 1 View  05/01/2013   CLINICAL DATA:  ET tube placement  EXAM: PORTABLE CHEST - 1 VIEW  COMPARISON:  05/01/2013  FINDINGS: Endotracheal tube tip projects 2 cm above the chronic, well positioned.  New gastric tube extends below the diaphragm into the stomach.  No change in cardiomegaly or in the appearance of the lungs. Stable right internal jugular central venous line, well positioned.  IMPRESSION: Endotracheal tube and gastric tube are well positioned. No change from the earlier study.   Electronically Signed   By: Lajean Manes M.D.   On: 05/01/2013 12:03   Dg Chest Port 1  View  05/01/2013   CLINICAL DATA:  Central line placement.  EXAM: PORTABLE CHEST - 1 VIEW  COMPARISON:  05/01/2013 at 0051 hr  FINDINGS: Right internal jugular central venous line tip lies in the lower superior vena cava, well positioned. There is no pneumothorax.  Lung volumes are low. There is central vascular congestion, mild irregular interstitial thickening and cardiomegaly findings supporting mild congestive heart failure.  IMPRESSION: New right internal jugular central venous line tip in the lower superior vena cava. No pneumothorax. No other change.   Electronically Signed   By: Lajean Manes M.D.   On: 05/01/2013 07:29   Dg Chest Port 1 View  05/01/2013   CLINICAL DATA:  Pneumonia  EXAM: PORTABLE CHEST - 1 VIEW  COMPARISON:  None.  FINDINGS: The lung volumes are low. The heart size is markedly enlarged. There is minimal fluid in the minor fissure. There is increased interstitial markings and central pulmonary markings bilaterally, left greater than right. There is patchy consolidation of medial left lung base. No acute abnormalities identified within the visualized bones.  IMPRESSION: Congestive heart failure. Patchy consolidation of medial left lung base which can be due to superimposed pneumonia.   Electronically Signed   By: Abelardo Diesel M.D.   On: 05/01/2013 01:06   Dg Abd Portable 1v  05/03/2013   CLINICAL DATA:  Nasogastric tube placement  EXAM: PORTABLE ABDOMEN - 1 VIEW  COMPARISON:  None.  FINDINGS: Nasogastric tube is identified with distal tip in the proximal to mid stomach. The stomach is air distended.  IMPRESSION: Nasogastric tube is identified with distal tip in the proximal to mid stomach.   Electronically Signed  By: Abelardo Diesel M.D.   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 He was admitted on 2/9   with acute fever to 102, delirium, severe hyperglycemia, lactic acidosis and acute respiratory failure. He was intubated and  started on broad antibacterial therapy. To date blood cultures are negative.he is intubated at this time. SPEP and UPEP with IFE are pending. Would add save smear rule out rouleaux.   Dr. Marin Olp    is to see the patient following this consult with recommendations regarding diagnosis, treatment options and further workup studies. Addendum to this note to be written. Thank you for the referral.   WERTMAN,SARA E, PA-C 05/03/2013 1:16 PM   ADDENDUM:  Very fascinating case!!  He is very large!!  Multiple medical issues. Intubated.  ?? Pneumonia.  Had MGUS noted 2 yrs ago.  BM bx done with 5% plasma cells.  Had >2083m of light chain in the urine then!!!  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  Has severe cardiomyopathy.  Very poor LVEF.   I do not see any obvious bone lesions on any of his scans.  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 I think one could also think about amyloidosis given the cardiac function.  He will need a bone marrow bx at some point.  I am also checking a serum viscosity.  We will have to await the w/u in order to decide if there is any indication for therapy.  THANKS!!!  PLaurey ArrowE  I suppose one could make a case for amyloidosis here.

## 2013-05-04 ENCOUNTER — Inpatient Hospital Stay (HOSPITAL_COMMUNITY): Payer: Non-veteran care

## 2013-05-04 LAB — RESPIRATORY VIRUS PANEL
ADENOVIRUS: NOT DETECTED
INFLUENZA A H1: NOT DETECTED
INFLUENZA A H3: NOT DETECTED
INFLUENZA B 1: NOT DETECTED
Influenza A: NOT DETECTED
Metapneumovirus: NOT DETECTED
PARAINFLUENZA 3 A: NOT DETECTED
Parainfluenza 1: NOT DETECTED
Parainfluenza 2: NOT DETECTED
RESPIRATORY SYNCYTIAL VIRUS A: NOT DETECTED
RHINOVIRUS: NOT DETECTED
Respiratory Syncytial Virus B: NOT DETECTED

## 2013-05-04 LAB — COMPREHENSIVE METABOLIC PANEL
ALBUMIN: 2.4 g/dL — AB (ref 3.5–5.2)
ALK PHOS: 53 U/L (ref 39–117)
ALT: 8 U/L (ref 0–53)
AST: 9 U/L (ref 0–37)
BUN: 19 mg/dL (ref 6–23)
CHLORIDE: 111 meq/L (ref 96–112)
CO2: 24 mEq/L (ref 19–32)
Calcium: 8.2 mg/dL — ABNORMAL LOW (ref 8.4–10.5)
Creatinine, Ser: 1.19 mg/dL (ref 0.50–1.35)
GFR calc Af Amer: 76 mL/min — ABNORMAL LOW (ref >90)
GFR calc non Af Amer: 66 mL/min — ABNORMAL LOW (ref >90)
Glucose, Bld: 326 mg/dL — ABNORMAL HIGH (ref 70–99)
Potassium: 3.6 mEq/L — ABNORMAL LOW (ref 3.7–5.3)
SODIUM: 147 meq/L (ref 137–147)
TOTAL PROTEIN: 7.1 g/dL (ref 6.0–8.3)
Total Bilirubin: 0.6 mg/dL (ref 0.3–1.2)

## 2013-05-04 LAB — PROTEIN ELECTROPHORESIS, SERUM
ALPHA-1-GLOBULIN: 4.1 % (ref 2.9–4.9)
ALPHA-2-GLOBULIN: 9.9 % (ref 7.1–11.8)
Albumin ELP: 46.2 % — ABNORMAL LOW (ref 55.8–66.1)
BETA GLOBULIN: 5.9 % (ref 4.7–7.2)
Beta 2: 4.7 % (ref 3.2–6.5)
Gamma Globulin: 29.2 % — ABNORMAL HIGH (ref 11.1–18.8)
M-SPIKE, %: 1.78 g/dL
Total Protein ELP: 7.1 g/dL (ref 6.0–8.3)

## 2013-05-04 LAB — GLUCOSE, CAPILLARY
GLUCOSE-CAPILLARY: 198 mg/dL — AB (ref 70–99)
Glucose-Capillary: 254 mg/dL — ABNORMAL HIGH (ref 70–99)
Glucose-Capillary: 278 mg/dL — ABNORMAL HIGH (ref 70–99)
Glucose-Capillary: 288 mg/dL — ABNORMAL HIGH (ref 70–99)
Glucose-Capillary: 317 mg/dL — ABNORMAL HIGH (ref 70–99)
Glucose-Capillary: 323 mg/dL — ABNORMAL HIGH (ref 70–99)

## 2013-05-04 LAB — LACTATE DEHYDROGENASE: LDH: 169 U/L (ref 94–250)

## 2013-05-04 LAB — UIFE/LIGHT CHAINS/TP QN, 24-HR UR
ALBUMIN, U: DETECTED
ALPHA 1 UR: DETECTED — AB
Alpha 2, Urine: DETECTED — AB
Beta, Urine: DETECTED — AB
FREE LAMBDA LT CHAINS, UR: 0.95 mg/dL — AB (ref 0.02–0.67)
Free Kappa Lt Chains,Ur: 83.1 mg/dL — ABNORMAL HIGH (ref 0.14–2.42)
Free Kappa/Lambda Ratio: 87.47 ratio — ABNORMAL HIGH (ref 2.04–10.37)
GAMMA UR: DETECTED — AB
Total Protein, Urine: 103.3 mg/dL

## 2013-05-04 LAB — CBC
HCT: 37.5 % — ABNORMAL LOW (ref 39.0–52.0)
Hemoglobin: 12 g/dL — ABNORMAL LOW (ref 13.0–17.0)
MCH: 28.1 pg (ref 26.0–34.0)
MCHC: 32 g/dL (ref 30.0–36.0)
MCV: 87.8 fL (ref 78.0–100.0)
PLATELETS: 75 10*3/uL — AB (ref 150–400)
RBC: 4.27 MIL/uL (ref 4.22–5.81)
RDW: 16.5 % — ABNORMAL HIGH (ref 11.5–15.5)
WBC: 3.9 10*3/uL — ABNORMAL LOW (ref 4.0–10.5)

## 2013-05-04 LAB — BLOOD GAS, ARTERIAL
Acid-Base Excess: 1.7 mmol/L (ref 0.0–2.0)
BICARBONATE: 26.2 meq/L — AB (ref 20.0–24.0)
Drawn by: 36496
FIO2: 0.4 %
MECHVT: 500 mL
O2 SAT: 95.9 %
PEEP/CPAP: 5 cmH2O
Patient temperature: 98.6
RATE: 20 resp/min
TCO2: 27.5 mmol/L (ref 0–100)
pCO2 arterial: 44.5 mmHg (ref 35.0–45.0)
pH, Arterial: 7.387 (ref 7.350–7.450)
pO2, Arterial: 74 mmHg — ABNORMAL LOW (ref 80.0–100.0)

## 2013-05-04 LAB — PHOSPHORUS
PHOSPHORUS: 4.3 mg/dL (ref 2.3–4.6)
Phosphorus: 2.6 mg/dL (ref 2.3–4.6)

## 2013-05-04 LAB — MAGNESIUM
MAGNESIUM: 1.6 mg/dL (ref 1.5–2.5)
MAGNESIUM: 2.1 mg/dL (ref 1.5–2.5)

## 2013-05-04 LAB — URINE CULTURE
Colony Count: NO GROWTH
Culture: NO GROWTH

## 2013-05-04 LAB — VANCOMYCIN, TROUGH: VANCOMYCIN TR: 20.7 ug/mL — AB (ref 10.0–20.0)

## 2013-05-04 LAB — BETA 2 MICROGLOBULIN, SERUM: Beta-2 Microglobulin: 2.78 mg/L — ABNORMAL HIGH (ref 1.01–1.73)

## 2013-05-04 MED ORDER — FUROSEMIDE 10 MG/ML IJ SOLN
40.0000 mg | Freq: Four times a day (QID) | INTRAMUSCULAR | Status: AC
  Administered 2013-05-04 (×3): 40 mg via INTRAVENOUS
  Filled 2013-05-04 (×3): qty 4

## 2013-05-04 MED ORDER — POTASSIUM PHOSPHATE DIBASIC 3 MMOLE/ML IV SOLN
30.0000 mmol | Freq: Once | INTRAVENOUS | Status: AC
  Administered 2013-05-04: 30 mmol via INTRAVENOUS
  Filled 2013-05-04: qty 10

## 2013-05-04 MED ORDER — POTASSIUM CHLORIDE 20 MEQ/15ML (10%) PO LIQD
40.0000 meq | Freq: Three times a day (TID) | ORAL | Status: AC
  Administered 2013-05-04 (×2): 40 meq
  Filled 2013-05-04 (×2): qty 30

## 2013-05-04 MED ORDER — POTASSIUM CHLORIDE 20 MEQ/15ML (10%) PO LIQD: 20.0000 meq | ORAL | Status: DC

## 2013-05-04 MED ORDER — VANCOMYCIN HCL 10 G IV SOLR
1250.0000 mg | Freq: Two times a day (BID) | INTRAVENOUS | Status: DC
  Administered 2013-05-04 – 2013-05-06 (×4): 1250 mg via INTRAVENOUS
  Filled 2013-05-04 (×5): qty 1250

## 2013-05-04 MED ORDER — DIGOXIN 125 MCG PO TABS
0.1250 mg | ORAL_TABLET | Freq: Every day | ORAL | Status: DC
  Administered 2013-05-04 – 2013-05-12 (×8): 0.125 mg via ORAL
  Filled 2013-05-04 (×9): qty 1

## 2013-05-04 MED ORDER — MAGNESIUM SULFATE 40 MG/ML IJ SOLN
2.0000 g | Freq: Once | INTRAMUSCULAR | Status: AC
  Administered 2013-05-04: 2 g via INTRAVENOUS
  Filled 2013-05-04: qty 50

## 2013-05-04 MED ORDER — INSULIN GLARGINE 100 UNIT/ML ~~LOC~~ SOLN
45.0000 [IU] | Freq: Every day | SUBCUTANEOUS | Status: DC
  Administered 2013-05-04 – 2013-05-06 (×3): 45 [IU] via SUBCUTANEOUS
  Filled 2013-05-04 (×3): qty 0.45

## 2013-05-04 NOTE — Progress Notes (Addendum)
Patient ID: Don Brown, male   DOB: [birth year], 58 y.o.   MRN: [id number]    Name: Don Brown MRN: [id number] DOB: [birth year]    ADMISSION DATE:  04/30/2013 REFERRING MD : ED  PRIMARY SERVICE: PCCM  CHIEF COMPLAINT: Altered mental status   BRIEF PATIENT DESCRIPTION: 18 M with known chronic pancreatitis, AFib, HF, obesity, HTN, HLD, coming in with acute agitation and fever, has HHS + elevated lactate. Concern for possible DT vs intraabdominal process (pancreatitis vs infection vs bowel ischemia). Cannot rule out meningitis but less likely (no headache, no nuchal rigidity)   SIGNIFICANT EVENTS / STUDIES:  CT head 04/30/13 negative for acute process  2/8 Neg HIV, Hep B and Hep C  2/9 CT : RLL > LLL opacity, likely atelectasis, ? PNA, liver lesions (cyst), pancreatic cyst (No acute pancreatitis), Gastriris?, Mild adenopathy along the gastrohepatic ligament, no abscess. Below the diaphragm, there is no convincing source for sepsis. No evidence of bowel ischemia. 2/10 Echo: Left ventricle:severely dilated.  LVH. Systolic severely reduced. The EF 20% to 25%. Diffuse hypokinesis.Right atrium: mildly dilated. 2/10 SPEP/UPEP: pending 2/11: Consulted Hem/Onc and ID 2/11: 24 hour urin:  Pending 2/11: resp panel pending  LINES / TUBES:  Foley 04/30/13 >>>  RIJ 2/9 >>  ETT>> 2/9 >>2/10 pt self extubated ETT replaced 2/10 PIV x2  CULTURES:  2/8 Bld Cx: NGTD 2/11 ucx pending  ANTIBIOTICS:  Zosyn 05/01/13 >>>  Vancomycin 05/01/13 >>>   SUBJECTIVE: Currently sedated.  VITAL SIGNS: Temp:  [99.7 F (37.6 C)-101.7 F (38.7 C)] 100.5 F (38.1 C) (02/12 0400) Pulse Rate:  [42-111] 77 (02/12 0436) Resp:  [0-21] 20 (02/12 0400) BP: (87-144)/(52-90) 90/68 mmHg (02/12 0436) SpO2:  [93 %-100 %] 99 % (02/12 0400) FiO2 (%):  [40 %-50 %] 40 % (02/12 0436) Weight:  [317 lb 7.4 oz (144 kg)] 317 lb 7.4 oz (144 kg) (02/12 0329) HEMODYNAMICS: CVP:  [13 mmHg-14 mmHg] 13 mmHg VENTILATOR SETTINGS: Vent Mode:   [-] PRVC FiO2 (%):  [40 %-50 %] 40 % Set Rate:  [20 bmp-23 bmp] 23 bmp Vt Set:  [500 mL] 500 mL PEEP:  [5 cmH20] 5 cmH20 Pressure Support:  [10 cmH20] 10 cmH20 Plateau Pressure:  [18 cmH20-25 cmH20] 19 cmH20 INTAKE / OUTPUT: Intake/Output     02/11 0701 - 02/12 0700   I.V. (mL/kg) 404 (2.8)   NG/GT 2440   IV Piggyback 50   Total Intake(mL/kg) 2894 (20.1)   Urine (mL/kg/hr) 5675 (1.6)   Total Output 5675   Net -2781         PHYSICAL EXAMINATION: General: Sedated. Wakes to name, shakes head to communicate Neuro: Sedated.  HEENT: No enlarged LN, no stridor, EOM full and equal  Cardiovascular: Irregular, no loud murmur  Lungs: CTAB Abdomen: Soft, no tenderness, no guarding  Musculoskeletal: No leg edema, no clubbing   LABS:  CBC  Recent Labs Lab 05/02/13 1100 05/03/13 0500 05/04/13 0430  WBC 6.0 4.7 3.9*  HGB 13.5 12.2* 12.0*  HCT 41.1 38.4* 37.5*  PLT 106* 86* 75*   Coag's No results found for this basename: APTT, INR,  in the last 168 hours BMET  Recent Labs Lab 05/02/13 2000 05/03/13 0500 05/04/13 0430  NA 147 148* 147  K 4.1 3.4* 3.6*  CL 117* 117* 111  CO2 '19 21 24  ' BUN '19 20 19  ' CREATININE 1.29 1.27 1.19  GLUCOSE 297* 181* 326*   Electrolytes  Recent Labs Lab 05/02/13 2000 05/03/13 0057 05/03/13 0500 05/03/13  1249 05/04/13 0430  CALCIUM 8.1*  --  8.3*  --  8.2*  MG  --  1.7  --  1.8 1.6  PHOS  --  4.5  --  3.4 2.6   Sepsis Markers  Recent Labs Lab 05/01/13 0500 05/01/13 0830 05/01/13 1300  LATICACIDVEN 1.9 0.9 1.1   ABG  Recent Labs Lab 05/02/13 1448 05/03/13 0357 05/04/13 0500  PHART 7.372 7.335* 7.387  PCO2ART 32.9* 40.6 44.5  PO2ART 131.0* 66.0* 74.0*   Liver Enzymes  Recent Labs Lab 04/30/13 2127 05/01/13 0504 05/04/13 0430  AST '24 14 9  ' ALT '20 13 8  ' ALKPHOS 127* 76 53  BILITOT 0.4 0.4 0.6  ALBUMIN 3.5 2.8* 2.4*   Cardiac Enzymes  Recent Labs Lab 05/01/13 1100 05/01/13 1243 05/01/13 1843  TROPONINI  0.38* 0.38* 0.31*   Glucose  Recent Labs Lab 05/03/13 0759 05/03/13 1231 05/03/13 1536 05/03/13 1931 05/04/13 0021 05/04/13 0348  GLUCAP 143* 230* 231* 321* 323* 317*   A1c 12.5  Urinalysis    Component Value Date/Time   COLORURINE AMBER* 05/03/2013 1131   APPEARANCEUR TURBID* 05/03/2013 1131   LABSPEC 1.035* 05/03/2013 1131   PHURINE 5.5 05/03/2013 1131   GLUCOSEU NEGATIVE 05/03/2013 1131   HGBUR LARGE* 05/03/2013 1131   BILIRUBINUR NEGATIVE 05/03/2013 [place] 05/03/2013 1131   PROTEINUR 100* 05/03/2013 1131   UROBILINOGEN 0.2 05/03/2013 1131   NITRITE NEGATIVE 05/03/2013 1131   LEUKOCYTESUR SMALL* 05/03/2013 1131    Drugs of Abuse     Component Value Date/Time   LABOPIA NONE DETECTED 04/30/2013 2145   LABOPIA NONE DETECTED 04/30/2013 2145   COCAINSCRNUR NONE DETECTED 04/30/2013 2145   COCAINSCRNUR NONE DETECTED 04/30/2013 2145   LABBENZ NONE DETECTED 04/30/2013 2145   LABBENZ NONE DETECTED 04/30/2013 2145   AMPHETMU NONE DETECTED 04/30/2013 2145   AMPHETMU NONE DETECTED 04/30/2013 2145   THCU NONE DETECTED 04/30/2013 2145   THCU NONE DETECTED 04/30/2013 2145   LABBARB NONE DETECTED 04/30/2013 2145   LABBARB NONE DETECTED 04/30/2013 2145    Imaging Dg Chest Port 1 View  05/03/2013   CLINICAL DATA:  Assess endotracheal tube placement  EXAM: PORTABLE CHEST - 1 VIEW  COMPARISON:  May 02, 2013 8 a.m.  FINDINGS: The heart size and mediastinal contours are stable. The heart size is enlarged. The aorta is tortuous. Endotracheal tube is seen with distal tip 3.9 cm from carina. There is no pneumothorax. Right jugular central venous line is unchanged. Both lungs are clear. The visualized skeletal structures are unremarkable.  IMPRESSION: Endotracheal tube distal tip 3.9 cm from carina. There is no pneumothorax. Cardiomegaly.   Electronically Signed   By: Abelardo Diesel M.D.   On: 05/03/2013 00:23   Dg Chest Port 1 View  05/02/2013   CLINICAL DATA:  Dyspnea  EXAM: PORTABLE CHEST - 1  VIEW  COMPARISON:  May 01, 2013  FINDINGS: Endotracheal tube tip is 8 mm above the carinal. Nasogastric tube tip and side port are below the diaphragm. Central catheter tip is at the cavoatrial junction. No pneumothorax.  There is mild atelectasis in the left base. The lungs are otherwise clear. Heart is enlarged with normal pulmonary vascularity, stable. No adenopathy.  IMPRESSION: Endotracheal tube tip is less than 1 cm above the carina. Consider withdrawal approximately 3 cm. No edema or consolidation. No pneumothorax.   Electronically Signed   By: Lowella Grip M.D.   On: 05/02/2013 08:37   Dg Abd Portable 1v  05/03/2013  CLINICAL DATA:  Nasogastric tube placement  EXAM: PORTABLE ABDOMEN - 1 VIEW  COMPARISON:  None.  FINDINGS: Nasogastric tube is identified with distal tip in the proximal to mid stomach. The stomach is air distended.  IMPRESSION: Nasogastric tube is identified with distal tip in the proximal to mid stomach.   Electronically Signed   By: Abelardo Diesel M.D.   On: 05/03/2013 00:41    CXR: pending  ASSESSMENT / PLAN:  PULMONARY  A: combined metabolic and resp acidosis, pH seems to be getting better with current intervention.  Unable to protect airway.      Intubated 2/9, then re-intubated 2/10 d/t self extubation  P:  - Re-intubated 2/10 after self extubation. - Begin PS trials.  - CXR in AM. - ABG in AM. - Adjust vent to ABG. - s/p lasix: cxr clear.  CARDIOVASCULAR  A: AFib     2/9 Echo: Left ventricle:severely dilated.  LVH. Systolic severely reduced. The EF 20% to 25%. Diffuse hypokinesis.Right atrium:         mildly dilated. Has ST-T wave inversion on lateral leads, which are new compared to 2012, possibly rate-related  A-fib with RVR now [place] score 2 P:  - HR , controlled on cardizem 60 mg PO QID with holding parameters and sedation - IVF KVO.  RENAL  A: Mild AKI, resolving after IVF      Hematuria, after hep start 2/9>> resolved with hep dc P:  - KVO  IVF.  - Cr normal after lasix use. - K, PO4 and Mg replacement.  GASTROINTESTINAL  A: Question of possible pancreatitis vs intraabdominal infection vs bowel ischemia, ruled out with CT and normal Panc enzymes, normal lactic acid P:  - Currently on broad spctrum abx. - PPI IV.  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 - Hb 12.0, continue monitor. PLT 75, WBC 3.9 - Spep, Upep pending - 24 hour urine pending - Scds. - H/O consult.  INFECTIOUS  A: Seems to be less likely as a cause of fever but possibilities are: intraabdominal infection vs meningitis  P:  - Currently on Zosyn + vanco.  - CT ABD/Pelvis Head report above. - Bld Cx: NGTD. - resp panel pending. - Ucx pending. - ID consult appreciated.  ENDOCRINE  A: Has HHS      Diabetes mellitus, 2/8 A1c 12.5  P:  - Monitor electrolytes. Replace potasium today - SSI  - K (3.6) , Mg (1.6) , phos NL >> replace Mg and K  today - CBG: elevated 326 this morning - Increase 35u this morning  NEUROLOGIC  A: Acute delirium, most likely cause metabolic encephalopathy (HHS vs DT). Less likely meningitis.  P:  - Fentanyl (200), versed (300) for sedation - Thiamine and folate started  - Broadly covered with abx for now. CT head normal.   Howard Pouch DO PGY-2  TODAY'S SUMMARY: Remains febrile. Urine studies pending.  Start PS trials, continue to hold heparin, H/O consult and ID consulted.  Continue abx.    CC time 35 min.  Patient seen and examined, agree with above note.  I dictated the care and orders written for this patient under my direction.  Rush Farmer, MD [phone]

## 2013-05-04 NOTE — Progress Notes (Signed)
Inpatient Diabetes Program Recommendations  AACE/ADA: New Consensus Statement on Inpatient Glycemic Control (2013)  Target Ranges:  Prepandial:   less than 140 mg/dL      Peak postprandial:   less than 180 mg/dL (1-2 hours)      Critically ill patients:  140 - 180 mg/dL   Reason for Assessment: Results for [name], [name] (MRN [id number]) as of 05/04/2013 12:27  Ref. Range 05/04/2013 00:21 05/04/2013 03:48 05/04/2013 08:38 05/04/2013 11:11  Glucose-Capillary Latest Range: 70-99 mg/dL 323 (H) 317 (H) 288 (H) 254 (H)   CBG's continue to be higher than goal.  Note that Lantus increased to 45 units daily today.  Patient is also on Novolog tube feed coverage 3 units q 4 hours.  Consider increasing Novolog tube feed coverage to 6 units q 4 hours.  If CBG's remain elevated, may need to resume IV insulin.  Thanks, Adah Perl, RN, BC-ADM Inpatient Diabetes Coordinator Pager [phone]

## 2013-05-04 NOTE — Progress Notes (Signed)
ANTIBIOTIC CONSULT NOTE - FOLLOW UP  Pharmacy Consult:  Vancomycin / Zosyn Indication:  Rule out PNA + persistent fever  Allergies  Allergen Reactions  . Morphine And Related Nausea And Vomiting    Patient Measurements: Height: 6' 0.05" (183 cm) Weight: 317 lb 7.4 oz (144 kg) IBW/kg (Calculated) : 77.71  Vital Signs: Temp: 100.5 F (38.1 C) (02/12 0400) BP: 90/68 mmHg (02/12 0436) Pulse Rate: 77 (02/12 0436) Intake/Output from previous day: 02/11 0701 - 02/12 0700 In: 2894 [I.V.:404; NG/GT:2440; IV Piggyback:50] Out: 5675 [Urine:5675]  Labs:  Recent Labs  05/02/13 1100 05/02/13 2000 05/03/13 0500 05/04/13 0430  WBC 6.0  --  4.7 3.9*  HGB 13.5  --  12.2* 12.0*  PLT 106*  --  86* 75*  CREATININE  --  1.29 1.27 1.19   Estimated Creatinine Clearance: 99.7 ml/min (by C-G formula based on Cr of 1.19).  Recent Labs  05/04/13 0430  VANCOTROUGH 20.7*     Microbiology: Recent Results (from the past 720 hour(s))  CULTURE, BLOOD (ROUTINE X 2)     Status: None   Collection Time    04/30/13 11:37 PM      Result Value Ref Range Status   Specimen Description BLOOD RIGHT HAND   Final   Special Requests BOTTLES DRAWN AEROBIC ONLY [facility]   Final   Culture  Setup Time     Final   Value: 05/01/2013 08:20     Performed at [facility]   Culture     Final   Value:        BLOOD CULTURE RECEIVED NO GROWTH TO DATE CULTURE WILL BE HELD FOR 5 DAYS BEFORE ISSUING A FINAL NEGATIVE REPORT     Performed at [facility]   Report Status PENDING   Incomplete  CULTURE, BLOOD (ROUTINE X 2)     Status: None   Collection Time    04/30/13 11:47 PM      Result Value Ref Range Status   Specimen Description BLOOD LEFT HAND   Final   Special Requests BOTTLES DRAWN AEROBIC AND ANAEROBIC 5CC EACH   Final   Culture  Setup Time     Final   Value: 05/01/2013 08:20     Performed at [facility]   Culture     Final   Value:        BLOOD CULTURE RECEIVED NO GROWTH TO DATE  CULTURE WILL BE HELD FOR 5 DAYS BEFORE ISSUING A FINAL NEGATIVE REPORT     Performed at [facility]   Report Status PENDING   Incomplete  MRSA PCR SCREENING     Status: None   Collection Time    05/01/13  6:16 AM      Result Value Ref Range Status   MRSA by PCR NEGATIVE  NEGATIVE Final   Comment:            The GeneXpert MRSA Assay (FDA     approved for NASAL specimens     only), is one component of a     comprehensive MRSA colonization     surveillance program. It is not     intended to diagnose MRSA     infection nor to guide or     monitor treatment for     MRSA infections.      Assessment: Don Brown admitted with AMS and fever and was started on vancomycin and Zosyn for possible abdominal infection versus meningitis.  Now concerned with PNA/influenza  in setting of persistent fevers.  Patient's vancomycin trough is slightly supra-therapeutic.  His renal function is fluctuating (SCr range 0.96-1.44).  Vanc 2/9 >> Zosyn 2/9 >>  2/12 VT = 20.7 mcg/mL (on 1500 q12, SCr 1.19)  2/11 RVP - collected 2/11 UCx - collected 2/8 BCx x2 - NGTD   Goal of Therapy:  Vancomycin trough level 15-20 mcg/ml   Plan:  - Change vanc to 1250mg  IV Q12H - Continue Zosyn 3.375gm IV Q8H, 4 hr infusion - Monitor renal fxn, clinical course, weekly vanc trough - F/U ?start empiric Tamiflu    Styles Fambro D. Mina Marble, PharmD, BCPS Pager:  [phone] 05/04/2013, 7:32 AM

## 2013-05-04 NOTE — Progress Notes (Signed)
[facility] Electrolyte Replacement Protocol  Patient Name: Don Brown DOB: 1955/06/22 MRN: [id number]  Date of Service  05/04/2013   HPI/Events of Note    Recent Labs Lab 05/01/13 2000 05/02/13 0002 05/02/13 0426 05/02/13 1215 05/02/13 2000 05/03/13 0057 05/03/13 0500 05/03/13 1249 05/04/13 0430  NA 147  --  146  --  147  --  148*  --  147  K 4.0  --  3.7  --  4.1  --  3.4*  --  3.6*  CL 116*  --  113*  --  117*  --  117*  --  111  CO2 19  --  17*  --  19  --  21  --  24  GLUCOSE 237*  --  323*  --  297*  --  181*  --  326*  BUN 16  --  15  --  19  --  20  --  19  CREATININE 1.12  --  0.96  --  1.29  --  1.27  --  1.19  CALCIUM 7.5*  --  8.4  --  8.1*  --  8.3*  --  8.2*  MG  --  2.0  --  2.0  --  1.7  --  1.8 1.6  PHOS  --  2.1*  --  2.2*  --  4.5  --  3.4 2.6    Estimated Creatinine Clearance: 99.7 ml/min (by C-G formula based on Cr of 1.19).  Intake/Output     02/11 0701 - 02/12 0700   I.V. (mL/kg) 404 (2.8)   NG/GT 2440   IV Piggyback 50   Total Intake(mL/kg) 2894 (20.1)   Urine (mL/kg/hr) 5675 (1.6)   Total Output 5675   Net -2781        - I/O DETAILED x24h    Total I/O In: 7026 [I.V.:230; NG/GT:1300; IV Piggyback:50] Out: 2400 [Urine:2400] - I/O THIS SHIFT    ASSESSMENT   eICURN Interventions  K+ 3.6  Electrolyte protocol criteria met. Value replaced per protocol. MD notified.    ASSESSMENT: MAJOR ELECTROLYTE    Don Brown 05/04/2013, 6:00 AM

## 2013-05-04 NOTE — Progress Notes (Signed)
Patient ID: Don Brown, male   DOB: Aug 13, 1955, 58 y.o.   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 antiseptic oral rinse  15 mL Mouth Rinse QID  . aspirin  325 mg Oral Daily  . chlorhexidine  15 mL Mouth Rinse BID  . digoxin  0.125 mg Oral Daily  . diltiazem  60 mg Oral 4 times per day  . feeding supplement (PRO-STAT SUGAR FREE 64)  30 mL Per Tube TID  . folic acid  1 mg Oral Daily  . free water  300 mL Per Tube Q6H  . furosemide  40 mg Intravenous Q6H  . insulin aspart  0-15 Units Subcutaneous 6 times per day  . insulin aspart  3 Units Subcutaneous 6 times per day  . insulin glargine  45 Units Subcutaneous Daily  . midazolam  2 mg Intravenous Once  . midazolam  4 mg Intravenous Once  . multivitamin  5 mL Oral Daily  . pantoprazole sodium  40 mg Per Tube Daily  . piperacillin-tazobactam (ZOSYN)  IV  3.375 g Intravenous 3 times per day  . potassium phosphate IVPB (mmol)  30 mmol Intravenous Once  . thiamine  100 mg Oral Daily  . vancomycin  1,250 mg Intravenous Q12H   Objective: Temp:  [99.9 F (37.7 C)-101.7 F (38.7 C)] 101.7 F (38.7 C) (02/12 1600) Pulse Rate:  [42-118] 73 (02/12 1600) Resp:  [11-23] 20 (02/12 1600) BP: (90-142)/(56-90) 113/75 mmHg (02/12 1600) SpO2:  [87 %-100 %] 93 % (02/12 1600) FiO2 (%):  [40 %] 40 % (02/12 1600) Weight:  [144 kg (317 lb 7.4 oz)] 144 kg (317 lb 7.4 oz) (02/12 0329)  General: He remains sedated and on the ventilator Skin: No rash. IV sites appear normal Lungs: Clear anteriorly. Moderate blood-tinged sputum being  suctioned Cor: Distant heart sounds but Regular S1 and S2 with no murmurs. Abdomen: Nontender. Healed transverse incision. No diarrhea. Joints and extremities: Normal  Lab Results Lab Results  Component Value Date   WBC 3.9* 05/04/2013   HGB 12.0* 05/04/2013   HCT 37.5* 05/04/2013   MCV 87.8 05/04/2013   PLT 75* 05/04/2013    Lab Results  Component Value Date   CREATININE 1.19 05/04/2013   BUN 19 05/04/2013   NA 147 05/04/2013   K 3.6* 05/04/2013   CL 111 05/04/2013   CO2 24 05/04/2013    Lab Results  Component Value Date   ALT 8 05/04/2013   AST 9 05/04/2013   ALKPHOS 53 05/04/2013   BILITOT 0.6 05/04/2013      Microbiology: Recent Results (from the past 240 hour(s))  CULTURE, BLOOD (ROUTINE X 2)     Status: None   Collection Time    04/30/13 11:37 PM      Result Value Ref Range Status   Specimen Description BLOOD RIGHT HAND   Final   Special Requests BOTTLES DRAWN AEROBIC ONLY [facility]   Final   Culture  Setup Time     Final   Value: 05/01/2013 08:20     Performed at [facility]   Culture     Final   Value:        BLOOD CULTURE RECEIVED NO GROWTH TO DATE CULTURE WILL BE HELD FOR 5 DAYS BEFORE ISSUING A FINAL NEGATIVE REPORT     Performed at [facility]   Report Status PENDING   Incomplete  CULTURE, BLOOD (ROUTINE X 2)     Status: None   Collection Time    04/30/13 11:47 PM      Result Value Ref Range Status   Specimen Description BLOOD LEFT HAND   Final   Special Requests BOTTLES DRAWN AEROBIC AND ANAEROBIC 5CC EACH   Final   Culture  Setup Time     Final   Value: 05/01/2013 08:20     Performed at [facility]   Culture     Final   Value:        BLOOD CULTURE RECEIVED NO GROWTH TO DATE CULTURE WILL BE HELD FOR 5 DAYS BEFORE ISSUING A FINAL NEGATIVE REPORT     Performed at [facility]   Report Status PENDING   Incomplete  MRSA PCR SCREENING     Status: None   Collection Time    05/01/13  6:16 AM      Result Value Ref Range Status   MRSA  by PCR NEGATIVE  NEGATIVE Final   Comment:            The GeneXpert MRSA Assay (FDA     approved for NASAL specimens     only), is one component of a     comprehensive MRSA colonization     surveillance program. It is not     intended to diagnose MRSA     infection nor to guide or     monitor treatment for     MRSA infections.  URINE CULTURE     Status: None   Collection Time    05/03/13 11:40 AM      Result Value Ref Range Status   Specimen Description URINE, CATHETERIZED   Final   Special Requests NONE   Final   Culture  Setup Time     Final   Value: 05/03/2013 12:30     Performed at [facility]     Final   Value: NO GROWTH     Performed at [facility]   Culture     Final   Value: NO GROWTH     Performed at [facility]   Report Status 05/04/2013 FINAL   Final    Studies/Results: Dg Chest Port 1 View  05/04/2013   CLINICAL DATA:  Evaluate endotracheal tube positioning  EXAM: PORTABLE CHEST - 1 VIEW  COMPARISON:  DG CHEST 1V PORT dated 05/02/2013; DG CHEST 1V PORT dated 05/01/2013  FINDINGS: Examination is degraded due to patient body habitus. Grossly unchanged enlarged cardiac silhouette and mediastinal contours given persistently reduced lung volumes and patient rotation. Stable position of support apparatus. No definite pneumothorax. Pulmonary venous congestion without definite evidence of edema. Grossly unchanged bibasilar opacities. Unchanged trace bilateral effusions. Unchanged bones.  IMPRESSION: 1.  Stable positioning of support apparatus.  No pneumothorax. 2. Worsening pulmonary venous congestion. 3. Persistent reduced lung volumes with grossly unchanged bibasilar opacities, atelectasis versus infiltrate   Electronically Signed   By: Sandi Mariscal M.D.   On: 05/04/2013  08:02   Dg Chest Port 1 View  05/03/2013   CLINICAL DATA:  Assess endotracheal tube placement  EXAM: PORTABLE CHEST - 1 VIEW  COMPARISON:  May 02, 2013 8 a.m.   FINDINGS: The heart size and mediastinal contours are stable. The heart size is enlarged. The aorta is tortuous. Endotracheal tube is seen with distal tip 3.9 cm from carina. There is no pneumothorax. Right jugular central venous line is unchanged. Both lungs are clear. The visualized skeletal structures are unremarkable.  IMPRESSION: Endotracheal tube distal tip 3.9 cm from carina. There is no pneumothorax. Cardiomegaly.   Electronically Signed   By: Abelardo Diesel M.D.   On: 05/03/2013 00:23   Dg Abd Portable 1v  05/03/2013   CLINICAL DATA:  Nasogastric tube placement  EXAM: PORTABLE ABDOMEN - 1 VIEW  COMPARISON:  None.  FINDINGS: Nasogastric tube is identified with distal tip in the proximal to mid stomach. The stomach is air distended.  IMPRESSION: Nasogastric tube is identified with distal tip in the proximal to mid stomach.   Electronically Signed   By: Abelardo Diesel M.D.   On: 05/03/2013 00:41    Assessment: His fevers and respiratory failure persist.  Plan: 1. Continue current antibiotics 2. Sputum Gram stain culture 3. Await results of respiratory viral panel  Michel Bickers, MD [facility] [place] [phone] pager   [phone] cell 05/04/2013, 4:37 PM

## 2013-05-05 ENCOUNTER — Inpatient Hospital Stay (HOSPITAL_COMMUNITY): Payer: Non-veteran care

## 2013-05-05 LAB — BLOOD GAS, ARTERIAL
Acid-Base Excess: 4.1 mmol/L — ABNORMAL HIGH (ref 0.0–2.0)
BICARBONATE: 28.2 meq/L — AB (ref 20.0–24.0)
Drawn by: 36496
FIO2: 0.4 %
MECHVT: 500 mL
O2 SAT: 93.2 %
PATIENT TEMPERATURE: 98.6
PEEP: 5 cmH2O
PO2 ART: 64.4 mmHg — AB (ref 80.0–100.0)
RATE: 20 resp/min
TCO2: 29.6 mmol/L (ref 0–100)
pCO2 arterial: 43.3 mmHg (ref 35.0–45.0)
pH, Arterial: 7.43 (ref 7.350–7.450)

## 2013-05-05 LAB — IMMUNOFIXATION ELECTROPHORESIS
IgA: 99 mg/dL (ref 68–379)
IgG (Immunoglobin G), Serum: 1930 mg/dL — ABNORMAL HIGH (ref 650–1600)
IgM, Serum: 35 mg/dL — ABNORMAL LOW (ref 41–251)
TOTAL PROTEIN ELP: 6.2 g/dL (ref 6.0–8.3)

## 2013-05-05 LAB — MAGNESIUM: Magnesium: 1.8 mg/dL (ref 1.5–2.5)

## 2013-05-05 LAB — GLUCOSE, CAPILLARY
GLUCOSE-CAPILLARY: 201 mg/dL — AB (ref 70–99)
GLUCOSE-CAPILLARY: 354 mg/dL — AB (ref 70–99)
Glucose-Capillary: 350 mg/dL — ABNORMAL HIGH (ref 70–99)
Glucose-Capillary: 255 mg/dL — ABNORMAL HIGH (ref 70–99)
Glucose-Capillary: 185 mg/dL — ABNORMAL HIGH (ref 70–99)
Glucose-Capillary: 241 mg/dL — ABNORMAL HIGH (ref 70–99)

## 2013-05-05 LAB — CBC
HCT: 41.2 % (ref 39.0–52.0)
Hemoglobin: 12.9 g/dL — ABNORMAL LOW (ref 13.0–17.0)
MCH: 27.9 pg (ref 26.0–34.0)
MCHC: 31.3 g/dL (ref 30.0–36.0)
MCV: 89 fL (ref 78.0–100.0)
PLATELETS: 75 10*3/uL — AB (ref 150–400)
RBC: 4.63 MIL/uL (ref 4.22–5.81)
RDW: 16.5 % — AB (ref 11.5–15.5)
WBC: 3.6 10*3/uL — ABNORMAL LOW (ref 4.0–10.5)

## 2013-05-05 LAB — BASIC METABOLIC PANEL
BUN: 25 mg/dL — ABNORMAL HIGH (ref 6–23)
CALCIUM: 8.5 mg/dL (ref 8.4–10.5)
CO2: 29 meq/L (ref 19–32)
Chloride: 105 mEq/L (ref 96–112)
Creatinine, Ser: 1.14 mg/dL (ref 0.50–1.35)
GFR calc Af Amer: 80 mL/min — ABNORMAL LOW (ref >90)
GFR, EST NON AFRICAN AMERICAN: 69 mL/min — AB (ref >90)
Glucose, Bld: 340 mg/dL — ABNORMAL HIGH (ref 70–99)
Potassium: 3.6 mEq/L — ABNORMAL LOW (ref 3.7–5.3)
SODIUM: 145 meq/L (ref 137–147)

## 2013-05-05 LAB — LACTATE DEHYDROGENASE: LDH: 148 U/L (ref 94–250)

## 2013-05-05 LAB — VISCOSITY, SERUM: VISCOSITY, SERUM: 1.7 rel to H2O (ref 1.5–1.9)

## 2013-05-05 LAB — PHOSPHORUS: Phosphorus: 4.1 mg/dL (ref 2.3–4.6)

## 2013-05-05 MED ORDER — POTASSIUM CHLORIDE 10 MEQ/50ML IV SOLN
10.0000 meq | INTRAVENOUS | Status: AC
  Administered 2013-05-05 (×4): 10 meq via INTRAVENOUS
  Filled 2013-05-05 (×3): qty 50

## 2013-05-05 MED ORDER — DEXTROSE 5 % IV SOLN
5.0000 mg/h | INTRAVENOUS | Status: DC
  Filled 2013-05-05: qty 100

## 2013-05-05 MED ORDER — HALOPERIDOL LACTATE 5 MG/ML IJ SOLN
5.0000 mg | Freq: Once | INTRAMUSCULAR | Status: AC
  Administered 2013-05-05: 5 mg via INTRAVENOUS

## 2013-05-05 MED ORDER — MAGNESIUM SULFATE 40 MG/ML IJ SOLN
2.0000 g | Freq: Once | INTRAMUSCULAR | Status: AC
  Administered 2013-05-05: 2 g via INTRAVENOUS
  Filled 2013-05-05: qty 50

## 2013-05-05 MED ORDER — PANTOPRAZOLE SODIUM 40 MG IV SOLR
40.0000 mg | INTRAVENOUS | Status: DC
  Administered 2013-05-05: 40 mg via INTRAVENOUS
  Filled 2013-05-05 (×2): qty 40

## 2013-05-05 MED ORDER — HALOPERIDOL LACTATE 5 MG/ML IJ SOLN
5.0000 mg | Freq: Four times a day (QID) | INTRAMUSCULAR | Status: DC | PRN
  Administered 2013-05-05 (×2): 5 mg via INTRAVENOUS
  Filled 2013-05-05 (×3): qty 1

## 2013-05-05 MED ORDER — POTASSIUM CHLORIDE 20 MEQ/15ML (10%) PO LIQD
20.0000 meq | ORAL | Status: DC
  Administered 2013-05-05: 20 meq
  Filled 2013-05-05: qty 15

## 2013-05-05 MED ORDER — INSULIN ASPART 100 UNIT/ML ~~LOC~~ SOLN
6.0000 [IU] | SUBCUTANEOUS | Status: DC
  Administered 2013-05-05: 6 [IU] via SUBCUTANEOUS

## 2013-05-05 MED ORDER — HALOPERIDOL LACTATE 5 MG/ML IJ SOLN
INTRAMUSCULAR | Status: AC
  Administered 2013-05-05: 5 mg via INTRAVENOUS
  Filled 2013-05-05: qty 1

## 2013-05-05 MED ORDER — DILTIAZEM HCL 100 MG IV SOLR
5.0000 mg/h | INTRAVENOUS | Status: DC
  Administered 2013-05-05: 10 mg/h via INTRAVENOUS
  Administered 2013-05-05: 15 mg/h via INTRAVENOUS
  Administered 2013-05-06: 5 mg/h via INTRAVENOUS
  Filled 2013-05-05 (×5): qty 100

## 2013-05-05 MED ORDER — FUROSEMIDE 10 MG/ML IJ SOLN
40.0000 mg | Freq: Four times a day (QID) | INTRAMUSCULAR | Status: AC
  Administered 2013-05-05 – 2013-05-06 (×3): 40 mg via INTRAVENOUS
  Filled 2013-05-05 (×3): qty 4

## 2013-05-05 NOTE — Procedures (Signed)
Extubation Procedure Note  Patient Details:   Name: Don Brown DOB: 1955-06-26 MRN: [id number]   Airway Documentation:     Evaluation  O2 sats: stable throughout Complications: No apparent complications Patient did tolerate procedure well. Bilateral Breath Sounds: Rhonchi Suctioning: Airway Yes  Mcneil Sober 05/05/2013, 9:06 AM

## 2013-05-05 NOTE — Progress Notes (Addendum)
Patient ID: Don Brown, male   DOB: 1955-05-03, 58 y.o.   MRN: [id number]    Name: Don Brown MRN: [id number] DOB: 1955-11-05    ADMISSION DATE:  04/30/2013 REFERRING MD : ED  PRIMARY SERVICE: PCCM  CHIEF COMPLAINT: Altered mental status   BRIEF PATIENT DESCRIPTION: 67 M with known chronic pancreatitis, AFib, HF, obesity, HTN, HLD, coming in with acute agitation and fever, has HHS + elevated lactate. Concern for possible DT vs intraabdominal process (pancreatitis vs infection vs bowel ischemia). Cannot rule out meningitis but less likely (no headache, no nuchal rigidity)   SIGNIFICANT EVENTS / STUDIES:  CT head 04/30/13 negative for acute process  2/8 Neg HIV, Hep B and Hep C  2/9 CT : RLL > LLL opacity, likely atelectasis, ? PNA, liver lesions (cyst), pancreatic cyst (No acute pancreatitis), Gastriris?, Mild adenopathy along the gastrohepatic ligament, no abscess. Below the diaphragm, there is no convincing source for sepsis. No evidence of bowel ischemia. 2/10 Echo: Left ventricle:severely dilated.  LVH. Systolic severely reduced. The EF 20% to 25%. Diffuse hypokinesis.Right atrium: mildly dilated. 2/10 SPEP/UPEP: Gamma globin blood. M-spike 1.78, positive for kappa/ lambda, alpha1/2, beta, gamma 2/11: Consulted Hem/Onc and ID 2/11: 24 hour urin:  Pending 2/11: Negative Resp viral panel collected from tracheal aspirate  LINES / TUBES:  Foley 04/30/13 >>>  RIJ 2/9 >>  ETT>> 2/9 >>2/10 pt self extubated ETT replaced 2/10>>>2/13 PIV x2  CULTURES:  2/8 Bld Cx: NGTD 2/11 ucx NGTD  ANTIBIOTICS:  Zosyn 05/01/13 >>>  Vancomycin 05/01/13 >>>   SUBJECTIVE: Alert and interactive.  VITAL SIGNS: Temp:  [98.9 F (37.2 C)-101.7 F (38.7 C)] 98.9 F (37.2 C) (02/13 0500) Pulse Rate:  [31-126] 90 (02/13 0500) Resp:  [11-23] 20 (02/13 0500) BP: (94-152)/(54-98) 119/80 mmHg (02/13 0500) SpO2:  [87 %-99 %] 93 % (02/13 0500) FiO2 (%):  [40 %] 40 % (02/13 0440) Weight:  [318 lb 2 oz (144.3 kg)]  318 lb 2 oz (144.3 kg) (02/13 0329) HEMODYNAMICS: CVP:  [13 mmHg-18 mmHg] 13 mmHg VENTILATOR SETTINGS: Vent Mode:  [-] PRVC FiO2 (%):  [40 %] 40 % Set Rate:  [20 bmp] 20 bmp Vt Set:  [500 mL] 500 mL PEEP:  [5 cmH20] 5 cmH20 Pressure Support:  [8 cmH20] 8 cmH20 Plateau Pressure:  [19 cmH20-25 cmH20] 19 cmH20 INTAKE / OUTPUT: Intake/Output     02/12 0701 - 02/13 0700   I.V. (mL/kg) 711 (4.9)   NG/GT 2350   IV Piggyback 817.5   Total Intake(mL/kg) 3878.5 (26.9)   Urine (mL/kg/hr) 4825 (1.4)   Total Output 4825   Net -946.5        PHYSICAL EXAMINATION: General: Awake, confused but interactive. Neuro: Awake and interactive, following command. Confused. HEENT: No enlarged LN, no stridor, EOM full and equal  Cardiovascular: Irregular, no loud murmur  Lungs: CTA B Abdomen: Soft, no tenderness, no guarding  Musculoskeletal: No leg edema, no clubbing   LABS:  CBC  Recent Labs Lab 05/03/13 0500 05/04/13 0430 05/05/13 0400  WBC 4.7 3.9* 3.6*  HGB 12.2* 12.0* 12.9*  HCT 38.4* 37.5* 41.2  PLT 86* 75* 75*   Coag's No results found for this basename: APTT, INR,  in the last 168 hours BMET  Recent Labs Lab 05/03/13 0500 05/04/13 0430 05/05/13 0400  NA 148* 147 145  K 3.4* 3.6* 3.6*  CL 117* 111 105  CO2 _0 BUN 20 19 25*  CREATININE 1.27 1.19 1.14  GLUCOSE 181* 326* 340*  Electrolytes  Recent Labs Lab 05/03/13 0500  05/04/13 0430 05/04/13 1500 05/05/13 0400  CALCIUM 8.3*  --  8.2*  --  8.5  MG  --   < > 1.6 2.1 1.8  PHOS  --   < > 2.6 4.3 4.1  < > = values in this interval not displayed. Sepsis Markers  Recent Labs Lab 05/01/13 0500 05/01/13 0830 05/01/13 1300  LATICACIDVEN 1.9 0.9 1.1   ABG  Recent Labs Lab 05/03/13 0357 05/04/13 0500 05/05/13 0550  PHART 7.335* 7.387 7.430  PCO2ART 40.6 44.5 43.3  PO2ART 66.0* 74.0* 64.4*   Liver Enzymes  Recent Labs Lab 04/30/13 2127 05/01/13 0504 05/04/13 0430  AST _0 ALT _1 ALKPHOS 127* 76 53  BILITOT 0.4 0.4 0.6  ALBUMIN 3.5 2.8* 2.4*   Cardiac Enzymes  Recent Labs Lab 05/01/13 1100 05/01/13 1243 05/01/13 1843  TROPONINI 0.38* 0.38* 0.31*   Glucose  Recent Labs Lab 05/04/13 0838 05/04/13 1111 05/04/13 1528 05/04/13 1841 05/05/13 0053 05/05/13 0346  GLUCAP 288* 254* 198* 278* 354* 350*   A1c 12.5  Imaging Dg Chest Port 1 View  05/04/2013   CLINICAL DATA:  Evaluate endotracheal tube positioning  EXAM: PORTABLE CHEST - 1 VIEW  COMPARISON:  DG CHEST 1V PORT dated 05/02/2013; DG CHEST 1V PORT dated 05/01/2013  FINDINGS: Examination is degraded due to patient body habitus. Grossly unchanged enlarged cardiac silhouette and mediastinal contours given persistently reduced lung volumes and patient rotation. Stable position of support apparatus. No definite pneumothorax. Pulmonary venous congestion without definite evidence of edema. Grossly unchanged bibasilar opacities. Unchanged trace bilateral effusions. Unchanged bones.  IMPRESSION: 1.  Stable positioning of support apparatus.  No pneumothorax. 2. Worsening pulmonary venous congestion. 3. Persistent reduced lung volumes with grossly unchanged bibasilar opacities, atelectasis versus infiltrate   Electronically Signed   By: Sandi Mariscal M.D.   On: 05/04/2013 08:02    CXR: pending  ASSESSMENT / PLAN:  PULMONARY  A: combined metabolic and resp acidosis, pH seems to be getting better with current intervention.  Unable to protect airway.      Intubated 2/9, then re-intubated 2/10 d/t self extubation  P:  - Extubate today after SBT.  - IS per RT protocol. - Flutter valve per RT protocol. - Titrate O2 for sat of 88-92%. - Adjust vent to ABG. - s/p lasix, repeat today  CARDIOVASCULAR  A: AFib     2/9 Echo: Left ventricle:severely dilated.  LVH. Systolic severely reduced. The EF 20% to 25%. Diffuse hypokinesis.Right atrium:         mildly dilated. Has ST-T wave inversion on lateral leads, which are new  compared to 2012, possibly rate-related  A-fib with RVR now [place] score 2 P:  - HR , controlled on cardizem 60 mg PO QID with holding parameters and sedation - IVF KVO. - Cardizem IV for HR control, OGT is out.  RENAL  A: Mild AKI, resolving after IVF      Hematuria, after hep start 2/9>> resolved with hep dc P:  - KVO IVF.  - Cr normal after lasix use, repeat lasix x3 - K and mag replacement today - UOP: 4825 (-946)/24  GASTROINTESTINAL  A: Question of possible pancreatitis vs intraabdominal infection vs bowel ischemia, ruled out with CT and normal Panc enzymes, normal lactic acid P:  - Currently on broad spctrum abx. - PPI IV. - Swallow evaluation.  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 PLT 75, WBC 3.6 - Spep, Upep resulted above; Heme onc following - Scds.  INFECTIOUS  A: Seems to be less likely as a cause of fever but possibilities are: intraabdominal infection vs meningitis  P:  - Currently on Zosyn + vanco.  - CT ABD/Pelvis Head report above. - Bld Cx: NGTD. - resp panel Neg; resp precautions discontinued - Ucx neg - Sputum gram stain/culrure needs to be collected - ID consult appreciated.  ENDOCRINE  A: Has HHS      Diabetes mellitus, 2/8 A1c 12.5  P:  - Monitor electrolytes. Replace potasium and mag today - SSI  - CBG: remains elevated despite steadily increasing Lantus. Increase again today. Increased Novolog tube feed coverage     to 6 units every 4 u  NEUROLOGIC  A: Acute delirium, most likely cause metabolic encephalopathy (HHS vs DT). Less likely meningitis.  P:  - Fentanyl and  versed  - Thiamine and folate started  - Broadly covered with abx for now. CT head normal.   Raoul Pitch, [place] DO PGY-2  TODAY'S SUMMARY: Remained febrile. Extubate today, continue to hold heparin due to hematuria, H/O consult and ID consulted.  Continue abx.  Continue  diureses.  Haldol for agitation.  Avoid sedating medications.  CC 35 min.  Patient seen and examined, agree with above note.  I dictated the care and orders written for this patient under my direction.  Rush Farmer, M.D. [facility] Pulmonary/Critical Care Medicine. Pager: [phone]. After hours pager: [phone].

## 2013-05-05 NOTE — Progress Notes (Addendum)
PULMONARY / CRITICAL CARE MEDICINE  Name: Don Brown MRN: [id number] DOB: 1956-03-12    ADMISSION DATE:  04/30/2013  REFERRING MD : EDP PRIMARY SERVICE: PCCM   CHIEF COMPLAINT: Altered mental status   BRIEF PATIENT DESCRIPTION: 69 obese with known chronic pancreatitis, AF, CHF admitted with acute encephalopathy and fever.  SIGNIFICANT EVENTS / STUDIES:  2/8  Head CT >>> nad 2/9  Abdomen CT >>>  Bilateral airspace disease, liver cysts, pancreatic cyst , gastritis, mild adenopathy along the gastrohepatic ligament, no abscess. No evidence of bowel ischemia. 2/10 TTE >>> Left ventricle is severely dilated.  LVH. EF 20% to 25%. Diffuse hypokinesis. Right atrium mildly dilated.  LINES / TUBES:  OETT 2/9 >>> 2/10; 2/10 >>> 2/13 Foley 2/8 >>>  R IJ TLC 2/9 >>> ???  CULTURES:  2/8   Blood >>> neg 2/9   MRSA >>> neg 2/11 Urine >>> neg  ANTIBIOTICS:  Zosyn 2/9  >>>  Vancomycin 2/9 >>>   INTERVAL HISTORY:  Remains extubated overnight  VITAL SIGNS: Temp:  [97.8 F (36.6 C)-99.9 F (37.7 C)] 99 F (37.2 C) (02/14 1000) Pulse Rate:  [62-110] 93 (02/14 1000) Resp:  [14-22] 19 (02/14 1000) BP: (66-161)/(44-129) 149/71 mmHg (02/14 1000) SpO2:  [89 %-98 %] 98 % (02/14 1000) Weight:  [141.5 kg (311 lb 15.2 oz)] 141.5 kg (311 lb 15.2 oz) (02/14 0400)  HEMODYNAMICS:   VENTILATOR SETTINGS:   INTAKE / OUTPUT: Intake/Output     02/13 0701 - 02/14 0700 02/14 0701 - 02/15 0700   P.O.  200   I.V. (mL/kg) 314.2 (2.2) 30 (0.2)   NG/GT 108.5    IV Piggyback 1150 250   Total Intake(mL/kg) 1572.7 (11.1) 480 (3.4)   Urine (mL/kg/hr) 6200 (1.8) 395 (0.6)   Total Output 6200 395   Net -4627.4 +85        Stool Occurrence 6 x     PHYSICAL EXAMINATION: General: No acute distress Neuro: Sleepy, but arouses to stimulation, follows commands and answers appropriatelly HEENT: PERRL, crowded oropharynx Cardiovascular: Irregular, no murmurs Lungs: Clear, diminished Abdomen: Soft, no tenderness,  no guarding  Musculoskeletal: No leg edema, no clubbing   LABS:  CBC  Recent Labs Lab 05/03/13 0500 05/04/13 0430 05/05/13 0400  WBC 4.7 3.9* 3.6*  HGB 12.2* 12.0* 12.9*  HCT 38.4* 37.5* 41.2  PLT 86* 75* 75*   Coag's No results found for this basename: APTT, INR,  in the last 168 hours  BMET  Recent Labs Lab 05/04/13 0430 05/05/13 0400 05/06/13 0235  NA 147 145 147  K 3.6* 3.6* 3.3*  CL 111 105 102  CO2 24 29 34*  BUN 19 25* 15  CREATININE 1.19 1.14 0.86  GLUCOSE 326* 340* 189*   Electrolytes  Recent Labs Lab 05/04/13 0430 05/04/13 1500 05/05/13 0400 05/06/13 0235  CALCIUM 8.2*  --  8.5 9.1  MG 1.6 2.1 1.8  --   PHOS 2.6 4.3 4.1  --    Sepsis Markers  Recent Labs Lab 05/01/13 0500 05/01/13 0830 05/01/13 1300  LATICACIDVEN 1.9 0.9 1.1   ABG  Recent Labs Lab 05/03/13 0357 05/04/13 0500 05/05/13 0550  PHART 7.335* 7.387 7.430  PCO2ART 40.6 44.5 43.3  PO2ART 66.0* 74.0* 64.4*   Liver Enzymes  Recent Labs Lab 04/30/13 2127 05/01/13 0504 05/04/13 0430  AST 24 14 9   ALT 20 13 8   ALKPHOS 127* 76 53  BILITOT 0.4 0.4 0.6  ALBUMIN 3.5 2.8* 2.4*   Cardiac Enzymes  Recent  Labs Lab 05/01/13 1100 05/01/13 1243 05/01/13 1843  TROPONINI 0.38* 0.38* 0.31*   Glucose  Recent Labs Lab 05/05/13 1205 05/05/13 1602 05/05/13 1926 05/06/13 0040 05/06/13 0347 05/06/13 0734  GLUCAP 201* 185* 241* 232* 150* 239*   CXR: None today  ASSESSMENT / PLAN:  PULMONARY  A:  Acute respiratory failure due to inability to protect airway, resolved Presumed pneumonia P:  Goal SpO2>92 Supplemental oxygen PRN Incentive spirometry / flutter valve  CARDIOVASCULAR  A:  AF Dilated cardiomyopathy Acute on chronic congestive heart failure P:  D/c Cardizem gtt Start Coreg 3.125 bid Restart preadmission Lisinopril Digoxin ASA Hydralazine PRN  RENAL  A:  AKI, resolved P:  Lasix 40 q6h K 40 x 2 D/c Foley  GASTROINTESTINAL  A:   Dysphagia GI Px is not indicated P:  D/c Protonix Swallow evaluation Clear liquid diet   HEMATOLOGIC  A: Thrombocytopenia Hx  MGUS VTE Px P:  Hematology following Trend CBC SCD  INFECTIOUS  A:  Presumed pneumonia P:  ID following  Complete abx x 8 days  ENDOCRINE  A:  DM Hyperglycemia P:  SSI lantus 55  NEUROLOGIC  A:  Acute delirium, resolved P:  Thiamine and folate started  D/c Haldol  Downgrade to SDU.  TRH will assume care in AM.  I have personally obtained history, examined patient, evaluated and interpreted laboratory and imaging results, reviewed medical records, formulated assessment / plan and placed orders.  Doree Fudge, MD Pulmonary and [place] Pager: [phone]  05/06/2013, 11:31 AM

## 2013-05-05 NOTE — Progress Notes (Signed)
[facility] ADULT ICU REPLACEMENT PROTOCOL FOR AM LAB REPLACEMENT ONLY  The patient does apply for the [facility] Adult ICU Electrolyte Replacment Protocol based on the criteria listed below:   1. Is GFR >/= 40 ml/min? yes  Patient's GFR today is 80 2. Is urine output >/= 0.5 ml/kg/hr for the last 6 hours? yes Patient's UOP is 0.9 ml/kg/hr 3. Is BUN < 60 mg/dL? yes  Patient's BUN today is 25 4. Abnormal electrolyte(s): K+3.6  Mg 1.8 5. Ordered repletion with: protocol 6. If a panic level lab has been reported, has the CCM MD in charge been notified? yes.   Physician:  [place], [place] 05/05/2013 6:47 AM

## 2013-05-05 NOTE — Progress Notes (Signed)
eLink Physician-Brief Progress Note Patient Name: Don Brown DOB: [birth year] MRN: [id number]  Date of Service  05/05/2013   HPI/Events of Note  Patient extubated earlier today.  Nurses having issues with agitation and patient being noncompliant with treatment.  He has haldol dose do to be given in 1 hour.  eICU Interventions  Haldol dose to be given now 5mg  IV.  Nurse to call back if further issues.   Intervention Category Minor Interventions: Agitation / anxiety - evaluation and management  Mauri Brooklyn, P 05/05/2013, 10:13 PM

## 2013-05-05 NOTE — Progress Notes (Signed)
Patient ID: Don Brown, male   DOB: 08/14/1955, 58 y.o.   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 antiseptic oral rinse  15 mL Mouth Rinse QID  . aspirin  325 mg Oral Daily  . chlorhexidine  15 mL Mouth Rinse BID  . digoxin  0.125 mg Oral Daily  . diltiazem  60 mg Oral 4 times per day  . feeding supplement (PRO-STAT SUGAR FREE 64)  30 mL Per Tube TID  . folic acid  1 mg Oral Daily  . free water  300 mL Per Tube Q6H  . insulin aspart  0-15 Units Subcutaneous 6 times per day  . insulin aspart  6 Units Subcutaneous 6 times per day  . insulin glargine  45 Units Subcutaneous Daily  . midazolam  2 mg Intravenous Once  . midazolam  4 mg Intravenous Once  . multivitamin  5 mL Oral Daily  . pantoprazole sodium  40 mg Per Tube Daily  . piperacillin-tazobactam (ZOSYN)  IV  3.375 g Intravenous 3 times per day  . potassium chloride  20 mEq Per Tube Q4H  . thiamine  100 mg Oral Daily  . vancomycin  1,250 mg Intravenous Q12H   Objective: Temp:  [98.9 F (37.2 C)-101.7 F (38.7 C)] 100 F (37.8 C) (02/13 0800) Pulse Rate:  [31-126] 68 (02/13 0800) Resp:  [11-23] 20 (02/13 0800) BP: (94-152)/(54-98) 112/72 mmHg (02/13 0800) SpO2:  [87 %-99 %] 92 % (02/13 0907) FiO2 (%):  [40 %] 40 % (02/13 0800) Weight:  [144.3 kg (318 lb 2 oz)] 144.3 kg (318 lb 2 oz) (02/13 0329)  General: He is much more alert and remains on the ventilator Skin: No rash. IV sites appear normal Lungs: Clear anteriorly. Moderate blood-tinged sputum being suctioned Cor: Distant heart sounds but Regular  S1 and S2 with no murmurs. Abdomen: Nontender. Healed transverse incision. No diarrhea. Joints and extremities: Normal  Lab Results Lab Results  Component Value Date   WBC 3.6* 05/05/2013   HGB 12.9* 05/05/2013   HCT 41.2 05/05/2013   MCV 89.0 05/05/2013   PLT 75* 05/05/2013    Lab Results  Component Value Date   CREATININE 1.14 05/05/2013   BUN 25* 05/05/2013   NA 145 05/05/2013   K 3.6* 05/05/2013   CL 105 05/05/2013   CO2 29 05/05/2013    Lab Results  Component Value Date   ALT 8 05/04/2013   AST 9 05/04/2013   ALKPHOS 53 05/04/2013   BILITOT 0.6 05/04/2013      Microbiology: Recent Results (from the past 240 hour(s))  CULTURE, BLOOD (ROUTINE X 2)     Status: None   Collection Time    04/30/13 11:37 PM      Result Value Ref Range Status   Specimen Description BLOOD RIGHT HAND   Final   Special Requests BOTTLES DRAWN AEROBIC ONLY 7CC   Final   Culture  Setup Time       Final   Value: 05/01/2013 08:20     Performed at [facility]   Culture     Final   Value:        BLOOD CULTURE RECEIVED NO GROWTH TO DATE CULTURE WILL BE HELD FOR 5 DAYS BEFORE ISSUING A FINAL NEGATIVE REPORT     Performed at [facility]   Report Status PENDING   Incomplete  CULTURE, BLOOD (ROUTINE X 2)     Status: None   Collection Time    04/30/13 11:47 PM      Result Value Ref Range Status   Specimen Description BLOOD LEFT HAND   Final   Special Requests BOTTLES DRAWN AEROBIC AND ANAEROBIC 5CC EACH   Final   Culture  Setup Time     Final   Value: 05/01/2013 08:20     Performed at [facility]   Culture     Final   Value:        BLOOD CULTURE RECEIVED NO GROWTH TO DATE CULTURE WILL BE HELD FOR 5 DAYS BEFORE ISSUING A FINAL NEGATIVE REPORT     Performed at [facility]   Report Status PENDING   Incomplete  MRSA PCR SCREENING     Status: None   Collection Time    05/01/13  6:16 AM      Result Value Ref Range Status   MRSA by PCR NEGATIVE  NEGATIVE Final   Comment:             The GeneXpert MRSA Assay (FDA     approved for NASAL specimens     only), is one component of a     comprehensive MRSA colonization     surveillance program. It is not     intended to diagnose MRSA     infection nor to guide or     monitor treatment for     MRSA infections.  URINE CULTURE     Status: None   Collection Time    05/03/13 11:40 AM      Result Value Ref Range Status   Specimen Description URINE, CATHETERIZED   Final   Special Requests NONE   Final   Culture  Setup Time     Final   Value: 05/03/2013 12:30     Performed at [facility]   Colony Count     Final   Value: NO GROWTH     Performed at [facility]   Culture     Final   Value: NO GROWTH     Performed at [facility]   Report Status 05/04/2013 FINAL   Final  RESPIRATORY VIRUS PANEL     Status: None   Collection Time    05/03/13  1:51 PM      Result Value Ref Range Status   Source - RVPAN TRACHEAL ASPIRATE   Final   Respiratory Syncytial Virus A NOT DETECTED   Final   Respiratory Syncytial Virus B NOT DETECTED   Final   Influenza A NOT DETECTED   Final   Influenza B NOT DETECTED   Final   Parainfluenza 1 NOT DETECTED   Final   Parainfluenza 2 NOT DETECTED   Final   Parainfluenza 3 NOT DETECTED   Final   Metapneumovirus NOT DETECTED   Final   Rhinovirus NOT DETECTED   Final   Adenovirus NOT DETECTED   Final   Influenza A H1 NOT DETECTED   Final   Influenza   A H3 NOT DETECTED   Final   Comment: (NOTE)           Normal Reference Range for each Analyte: NOT DETECTED     Testing performed using the Luminex xTAG Respiratory Viral Panel test     kit.     This test was developed and its performance characteristics determined     by [facility]. It has not been cleared or approved by the [place]     Food and Drug Administration. This test is used for clinical purposes.     It should not be regarded as investigational or for research. This     laboratory is certified under the  [place] (CLIA) as qualified to perform high complexity     clinical laboratory testing.     Performed at [facility]    Studies/Results: Dg Chest Port 1 View  05/05/2013   CLINICAL DATA:  Hypoxia  EXAM: PORTABLE CHEST - 1 VIEW  COMPARISON:  May 04, 2013  FINDINGS: Endotracheal tube tip is 2.7 cm above the carina. Central catheter tip is in the superior vena cava. Nasogastric tube tip and side port are below the diaphragm. No apparent pneumothorax.  Cardiomegaly persists. There is pulmonary venous hypertension. There is mild interstitial edema. There is medial consolidation in both lung bases. No adenopathy.  IMPRESSION: Findings felt to represent a degree of congestive heart failure with perhaps slightly more edema compared to 1 day prior. Tube and catheter positions are as described without pneumothorax.   Electronically Signed   By: Lowella Grip M.D.   On: 05/05/2013 07:15   Dg Chest Port 1 View  05/04/2013   CLINICAL DATA:  Evaluate endotracheal tube positioning  EXAM: PORTABLE CHEST - 1 VIEW  COMPARISON:  DG CHEST 1V PORT dated 05/02/2013; DG CHEST 1V PORT dated 05/01/2013  FINDINGS: Examination is degraded due to patient body habitus. Grossly unchanged enlarged cardiac silhouette and mediastinal contours given persistently reduced lung volumes and patient rotation. Stable position of support apparatus. No definite pneumothorax. Pulmonary venous congestion without definite evidence of edema. Grossly unchanged bibasilar opacities. Unchanged trace bilateral effusions. Unchanged bones.  IMPRESSION: 1.  Stable positioning of support apparatus.  No pneumothorax. 2. Worsening pulmonary venous congestion. 3. Persistent reduced lung volumes with grossly unchanged bibasilar opacities, atelectasis versus infiltrate   Electronically Signed   By: Sandi Mariscal M.D.   On: 05/04/2013 08:02    Assessment: I will plan on about 8 days of antimicrobial  therapy for presumed pneumonia.  Plan: 1. Continue current antibiotics 2. Sputum Gram stain culture 3. Discontinue droplet precautions  Michel Bickers, MD [facility] [phone] pager   [phone] cell 05/05/2013, 9:22 AM

## 2013-05-06 ENCOUNTER — Inpatient Hospital Stay (HOSPITAL_COMMUNITY): Payer: Non-veteran care

## 2013-05-06 LAB — GLUCOSE, CAPILLARY
GLUCOSE-CAPILLARY: 239 mg/dL — AB (ref 70–99)
GLUCOSE-CAPILLARY: 299 mg/dL — AB (ref 70–99)
GLUCOSE-CAPILLARY: 275 mg/dL — AB (ref 70–99)
Glucose-Capillary: 138 mg/dL — ABNORMAL HIGH (ref 70–99)
Glucose-Capillary: 150 mg/dL — ABNORMAL HIGH (ref 70–99)
Glucose-Capillary: 232 mg/dL — ABNORMAL HIGH (ref 70–99)

## 2013-05-06 LAB — BASIC METABOLIC PANEL
BUN: 15 mg/dL (ref 6–23)
CALCIUM: 9.1 mg/dL (ref 8.4–10.5)
CO2: 34 mEq/L — ABNORMAL HIGH (ref 19–32)
CREATININE: 0.86 mg/dL (ref 0.50–1.35)
Chloride: 102 mEq/L (ref 96–112)
GFR calc Af Amer: >90 mL/min (ref >90)
Glucose, Bld: 189 mg/dL — ABNORMAL HIGH (ref 70–99)
Potassium: 3.3 mEq/L — ABNORMAL LOW (ref 3.7–5.3)
SODIUM: 147 meq/L (ref 137–147)

## 2013-05-06 LAB — CLOSTRIDIUM DIFFICILE BY PCR: Toxigenic C. Difficile by PCR: NEGATIVE

## 2013-05-06 LAB — LACTATE DEHYDROGENASE: LDH: 237 U/L (ref 94–250)

## 2013-05-06 MED ORDER — POTASSIUM CHLORIDE 20 MEQ/15ML (10%) PO LIQD
40.0000 meq | ORAL | Status: AC
  Administered 2013-05-06 (×2): 40 meq via ORAL
  Filled 2013-05-06 (×2): qty 30

## 2013-05-06 MED ORDER — LOPERAMIDE HCL 2 MG PO CAPS
4.0000 mg | ORAL_CAPSULE | ORAL | Status: DC | PRN
  Administered 2013-05-06: 4 mg via ORAL
  Filled 2013-05-06 (×2): qty 2

## 2013-05-06 MED ORDER — PNEUMOCOCCAL VAC POLYVALENT 25 MCG/0.5ML IJ INJ
0.5000 mL | INJECTION | INTRAMUSCULAR | Status: DC
  Filled 2013-05-06 (×3): qty 0.5

## 2013-05-06 MED ORDER — CARVEDILOL 3.125 MG PO TABS
3.1250 mg | ORAL_TABLET | Freq: Two times a day (BID) | ORAL | Status: DC
  Administered 2013-05-06 – 2013-05-08 (×4): 3.125 mg via ORAL
  Filled 2013-05-06 (×6): qty 1

## 2013-05-06 MED ORDER — LISINOPRIL 10 MG PO TABS
10.0000 mg | ORAL_TABLET | Freq: Every day | ORAL | Status: DC
  Administered 2013-05-06 – 2013-05-09 (×4): 10 mg via ORAL
  Filled 2013-05-06 (×4): qty 1

## 2013-05-06 MED ORDER — PANTOPRAZOLE SODIUM 40 MG PO TBEC: 40.0000 mg | DELAYED_RELEASE_TABLET | Freq: Every day | ORAL | Status: DC

## 2013-05-06 MED ORDER — INSULIN GLARGINE 100 UNIT/ML ~~LOC~~ SOLN
55.0000 [IU] | Freq: Every day | SUBCUTANEOUS | Status: DC
  Administered 2013-05-07: 55 [IU] via SUBCUTANEOUS
  Filled 2013-05-06: qty 0.55

## 2013-05-06 NOTE — Procedures (Signed)
Objective Swallowing Evaluation: Modified Barium Swallowing Study  Patient Details  Name: Don Brown MRN: [id number] Date of Birth: 1955/10/17  Today's Date: 05/06/2013 Time: 6283-1517 SLP Time Calculation (min): 18 min  Past Medical History:  Past Medical History  Diagnosis Date  . 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 Atrial fibrillation   . CHF (congestive heart failure)     Chronic systolic heart failure with an ejection fraction of 40%- 45%  . Morbid obesity   . Chronic pain   . Narcotic dependence, episodic use     chronic narcotic use  . Respiratory failure     History of ventilatory-dependent respiratory failure and tracheostomy   . History of renal failure     requiring hemodialysis in the past with normal renal function at this point  . History of ventral hernia repair   . Cervical compression fracture     Cervical disk disease requiring surgery  . Hypertension   . Hyperlipidemia   . Degenerative joint disease   . Heart murmur    Past Surgical History:  Past Surgical History  Procedure Laterality Date  . Ventral hernia repair    . Cholecystectomy    . Tracheostomy    . Pancreas surgery    . Pancreatic stents    . Skin graft    . Insertion of mesh     HPI:  44 M with known chronic pancreatitis, AFib, HF, obesity, HTN, HLD, coming in with acute agitation and fever, has HHS + elevated lactate. Concern for possible DT vs intraabdominal process (pancreatitis vs infection vs bowel ischemia). Cannot rule out meningitis but less likely (no headache, no nuchal rigidity)     Assessment / Plan / Recommendation Clinical Impression  Dysphagia Diagnosis: Within Functional Limits Clinical impression: Pt demonstrates adequate oropharyngeal function. No aspiration or penetration, swallow timely, no residuals. Pt is safe to advance to a  regular texture diet when MD permits. Recommend Regular/thin. No SLP f/u needed.     Treatment Recommendation  Therapy as outlined in treatment plan below    Diet Recommendation Regular;Thin liquid   Liquid Administration via: Cup;Straw Medication Administration: Whole meds with liquid Supervision: Staff to assist with self feeding Compensations: Slow rate;Small sips/bites Postural Changes and/or Swallow Maneuvers: Seated upright 90 degrees;Upright 30-60 min after meal    Other  Recommendations Recommended Consults: FEES Oral Care Recommendations: Oral care Q4 per protocol;Oral care BID   Follow Up Recommendations  None    Frequency and Duration min 2x/week  2 weeks   Pertinent Vitals/Pain NA    SLP Swallow Goals     General Date of Onset: 04/30/13 HPI: 23 M with known chronic pancreatitis, AFib, HF, obesity, HTN, HLD, coming in with acute agitation and fever, has HHS + elevated lactate. Concern for possible DT vs intraabdominal process (pancreatitis vs infection vs bowel ischemia). Cannot rule out meningitis but less likely (no headache, no nuchal rigidity) Type of Study: Modified Barium Swallowing Study Reason for Referral: Objectively evaluate swallowing function Diet Prior to this Study: Thin liquids Temperature Spikes Noted: No Respiratory Status: Nasal cannula History of Recent Intubation: Yes Length of Intubations (days): 4 days Date extubated: 05/05/13 Behavior/Cognition: Cooperative Oral Cavity - Dentition: Adequate natural dentition Oral Motor / Sensory Function: Within functional limits Self-Feeding Abilities: Able to feed self Patient Positioning: Upright in bed Baseline Vocal Quality: Breathy;Hoarse Volitional Cough: Wet;Weak Volitional  Swallow: Able to elicit Anatomy: Within functional limits Pharyngeal Secretions: Normal    Reason for Referral Objectively evaluate swallowing function   Oral Phase Oral Preparation/Oral Phase Oral Phase: WFL    Pharyngeal Phase Pharyngeal Phase Pharyngeal Phase: Within functional limits  Cervical Esophageal Phase    GO             Don [place], MA CCC-SLP [phone]  Don Brown 05/06/2013, 2:58 PM

## 2013-05-06 NOTE — Evaluation (Signed)
Clinical/Bedside Swallow Evaluation Patient Details  Name: Don Brown MRN: [id number] Date of Birth: Aug 30, 1955  Today's Date: 05/06/2013 Time: 0254-2706 SLP Time Calculation (min): 30 min  Past Medical History:  Past Medical History  Diagnosis Date  . 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 Atrial fibrillation   . CHF (congestive heart failure)     Chronic systolic heart failure with an ejection fraction of 40%- 45%  . Morbid obesity   . Chronic pain   . Narcotic dependence, episodic use     chronic narcotic use  . Respiratory failure     History of ventilatory-dependent respiratory failure and tracheostomy   . History of renal failure     requiring hemodialysis in the past with normal renal function at this point  . History of ventral hernia repair   . Cervical compression fracture     Cervical disk disease requiring surgery  . Hypertension   . Hyperlipidemia   . Degenerative joint disease   . Heart murmur    Past Surgical History:  Past Surgical History  Procedure Laterality Date  . Ventral hernia repair    . Cholecystectomy    . Tracheostomy    . Pancreas surgery    . Pancreatic stents    . Skin graft    . Insertion of mesh     HPI:  2 M with known chronic pancreatitis, AFib, HF, obesity, HTN, HLD, coming in with acute agitation and fever, has HHS + elevated lactate.   CT head 04/30/13 negative for acute process 2/8 Neg HIV, Hep B and Hep C 2/9 CT : RLL > LLL opacity, likely atelectasis.  Intubated 2/9 to 2/13.  BSE indicated following extubation.      Assessment / Plan / Recommendation Clinical Impression   BSE completed.  Suspected temporary dysphagia following lengthy intubation further exacerbated by weakness and decreased endurance.  Vocal quality hoarse due to possible vocal cord edema following extubation.  Coughing noted  moderately after PO's.  Recommend to proceed with objective evaluation of FEES to objectively assess for aspiration secondary to other risk factors of lethargy, cognitive deficits, present weakness, and recent intubation.  FEES to be completed this date to determine safest, PO diet.      Aspiration Risk  Moderate    Diet Recommendation Thin liquid   Liquid Administration via: Cup;No straw Medication Administration: Via alternative means Supervision: Staff to assist with self feeding Compensations: Slow rate;Small sips/bites Postural Changes and/or Swallow Maneuvers: Seated upright 90 degrees;Upright 30-60 min after meal    Other  Recommendations Recommended Consults: FEES Oral Care Recommendations: Oral care Q4 per protocol   Follow Up Recommendations  Inpatient Rehab    Frequency and Duration min 2x/week  2 weeks       SLP Swallow Goals Goals pending results of FEES  Swallow Study Prior Functional Status  Type of Home: Apartment Available Help at Discharge: Other (Comment) (TBA)    General Date of Onset: 04/30/13 HPI: [name] with known chronic pancreatitis, AFib, HF, obesity, HTN, HLD, coming in with acute agitation and fever, has HHS + elevated lactate. Concern for possible DT vs intraabdominal process (pancreatitis vs infection vs bowel ischemia). Cannot rule out meningitis but less likely (no headache, no nuchal rigidity) Type of Study: Bedside swallow evaluation Diet Prior to this Study: Thin liquids Temperature Spikes Noted: No Respiratory Status: Nasal cannula History  of Recent Intubation: Yes Length of Intubations (days): 4 days Date extubated: 05/05/13 Behavior/Cognition: Cooperative Oral Cavity - Dentition: Adequate natural dentition Self-Feeding Abilities: Able to feed self;Needs assist;Needs set up Baseline Vocal Quality: Breathy;Hoarse Volitional Cough: Wet;Weak Volitional Swallow: Able to elicit    Oral/Motor/Sensory Function Overall Oral Motor/Sensory  Function: Appears within functional limits for tasks assessed   Ice Chips Ice chips: Impaired Pharyngeal Phase Impairments: Suspected delayed Swallow;Cough - Delayed   Thin Liquid Thin Liquid: Impaired Presentation: Cup;Spoon Pharyngeal  Phase Impairments: Suspected delayed Swallow;Cough - Delayed;Change in Vital Signs    Nectar Thick Nectar Thick Liquid: Not tested   Honey Thick Honey Thick Liquid: Not tested   Puree Puree: Impaired Pharyngeal Phase Impairments: Suspected delayed Swallow;Cough - Delayed   Solid   GO    Solid: Not tested      Sharman Crate [place], CCC-SLP [phone] [facility] 05/06/2013,11:34 AM

## 2013-05-06 NOTE — Progress Notes (Signed)
Patient ID: Don Brown, male   DOB: 1955-04-18, 58 y.o.   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 antiseptic oral rinse  15 mL Mouth Rinse QID  . aspirin  325 mg Oral Daily  . carvedilol  3.125 mg Oral BID WC  . digoxin  0.125 mg Oral Daily  . folic acid  1 mg Oral Daily  . insulin aspart  0-15 Units Subcutaneous 6 times per day  . [START ON 05/07/2013] insulin glargine  55 Units Subcutaneous Daily  . lisinopril  10 mg Oral Daily  . multivitamin  5 mL Oral Daily  . piperacillin-tazobactam (ZOSYN)  IV  3.375 g Intravenous 3 times per day  . [START ON 05/07/2013] pneumococcal 23 valent vaccine  0.5 mL Intramuscular Tomorrow-1000  . thiamine  100 mg Oral Daily    Past Medical History  Diagnosis Date  . 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 Atrial fibrillation   . CHF (congestive heart failure)     Chronic systolic heart failure with an ejection fraction of 40%- 45%  . Morbid obesity   . Chronic pain   . Narcotic dependence, episodic use     chronic narcotic use  . Respiratory failure     History of ventilatory-dependent respiratory failure and tracheostomy   . History of renal failure     requiring hemodialysis in the past with normal renal function at this point  . History of ventral hernia repair   . Cervical compression fracture     Cervical disk disease requiring surgery  . Hypertension   . Hyperlipidemia   .  Degenerative joint disease   . Heart murmur     History  Substance Use Topics  . Smoking status: Never Smoker   . Smokeless tobacco: Not on file  . Alcohol Use: No    Family History  Problem Relation Age of Onset  . Hypertension Mother   . Diabetes Mother   . Lymphoma Father     Allergies  Allergen Reactions  . Morphine And Related Nausea And Vomiting    Objective: Temp:  [97.8 F (36.6 C)-99.7 F (37.6 C)] 98.4 F (36.9 C) (02/14 1600) Pulse Rate:  [62-103] 89 (02/14 1700) Resp:  [14-25] 25 (02/14 1700) BP: (66-167)/(44-132) 138/76 mmHg (02/14 1700) SpO2:  [93 %-100 %] 97 % (02/14 1700) Weight:  [141.5 kg (311 lb 15.2 oz)] 141.5 kg (311 lb 15.2 oz) (02/14 0400)  General: He is now extubated, sitting up watching television Skin: No rash Lungs: Few rhonchi Cor: Regular S1-S2 no murmurs  Lab Results Lab Results  Component Value Date   WBC 3.6* 05/05/2013   HGB 12.9* 05/05/2013   HCT 41.2 05/05/2013   MCV 89.0 05/05/2013   PLT 75* 05/05/2013    Lab Results  Component Value Date   CREATININE 0.86 05/06/2013  BUN 15 05/06/2013   NA 147 05/06/2013   K 3.3* 05/06/2013   CL 102 05/06/2013   CO2 34* 05/06/2013    Lab Results  Component Value Date   ALT 8 05/04/2013   AST 9 05/04/2013   ALKPHOS 53 05/04/2013   BILITOT 0.6 05/04/2013      Microbiology: Recent Results (from the past 240 hour(s))  CULTURE, BLOOD (ROUTINE X 2)     Status: None   Collection Time    04/30/13 11:37 PM      Result Value Ref Range Status   Specimen Description BLOOD RIGHT HAND   Final   Special Requests BOTTLES DRAWN AEROBIC ONLY [facility]   Final   Culture  Setup Time     Final   Value: 05/01/2013 08:20     Performed at [facility]   Culture     Final   Value:        BLOOD CULTURE RECEIVED NO GROWTH TO DATE CULTURE WILL BE HELD FOR 5 DAYS BEFORE ISSUING A FINAL NEGATIVE REPORT     Performed at [facility]   Report Status PENDING   Incomplete  CULTURE, BLOOD (ROUTINE X 2)      Status: None   Collection Time    04/30/13 11:47 PM      Result Value Ref Range Status   Specimen Description BLOOD LEFT HAND   Final   Special Requests BOTTLES DRAWN AEROBIC AND ANAEROBIC 5CC EACH   Final   Culture  Setup Time     Final   Value: 05/01/2013 08:20     Performed at [facility]   Culture     Final   Value:        BLOOD CULTURE RECEIVED NO GROWTH TO DATE CULTURE WILL BE HELD FOR 5 DAYS BEFORE ISSUING A FINAL NEGATIVE REPORT     Performed at [facility]   Report Status PENDING   Incomplete  MRSA PCR SCREENING     Status: None   Collection Time    05/01/13  6:16 AM      Result Value Ref Range Status   MRSA by PCR NEGATIVE  NEGATIVE Final   Comment:            The GeneXpert MRSA Assay (FDA     approved for NASAL specimens     only), is one component of a     comprehensive MRSA colonization     surveillance program. It is not     intended to diagnose MRSA     infection nor to guide or     monitor treatment for     MRSA infections.  URINE CULTURE     Status: None   Collection Time    05/03/13 11:40 AM      Result Value Ref Range Status   Specimen Description URINE, CATHETERIZED   Final   Special Requests NONE   Final   Culture  Setup Time     Final   Value: 05/03/2013 12:30     Performed at [facility]     Final   Value: NO GROWTH     Performed at [facility]   Culture     Final   Value: NO GROWTH     Performed at [facility]   Report Status 05/04/2013 FINAL   Final  RESPIRATORY VIRUS PANEL     Status: None   Collection Time  05/03/13  1:51 PM      Result Value Ref Range Status   Source - RVPAN TRACHEAL ASPIRATE   Final   Respiratory Syncytial Virus A NOT DETECTED   Final   Respiratory Syncytial Virus B NOT DETECTED   Final   Influenza A NOT DETECTED   Final   Influenza B NOT DETECTED   Final   Parainfluenza 1 NOT DETECTED   Final   Parainfluenza 2 NOT DETECTED   Final   Parainfluenza 3 NOT  DETECTED   Final   Metapneumovirus NOT DETECTED   Final   Rhinovirus NOT DETECTED   Final   Adenovirus NOT DETECTED   Final   Influenza A H1 NOT DETECTED   Final   Influenza A H3 NOT DETECTED   Final   Comment: (NOTE)           Normal Reference Range for each Analyte: NOT DETECTED     Testing performed using the Luminex xTAG Respiratory Viral Panel test     kit.     This test was developed and its performance characteristics determined     by [facility]. It has not been cleared or approved by the [place]     Food and Drug Administration. This test is used for clinical purposes.     It should not be regarded as investigational or for research. This     laboratory is certified under the [employer] (CLIA) as qualified to perform high complexity     clinical laboratory testing.     Performed at [facility]    Studies/Results: Dg Chest Port 1 View  05/05/2013   CLINICAL DATA:  Hypoxia  EXAM: PORTABLE CHEST - 1 VIEW  COMPARISON:  May 04, 2013  FINDINGS: Endotracheal tube tip is 2.7 cm above the carina. Central catheter tip is in the superior vena cava. Nasogastric tube tip and side port are below the diaphragm. No apparent pneumothorax.  Cardiomegaly persists. There is pulmonary venous hypertension. There is mild interstitial edema. There is medial consolidation in both lung bases. No adenopathy.  IMPRESSION: Findings felt to represent a degree of congestive heart failure with perhaps slightly more edema compared to 1 day prior. Tube and catheter positions are as described without pneumothorax.   Electronically Signed   By: Lowella Grip M.D.   On: 05/05/2013 07:15    Assessment: It is not entirely clear what has caused his recent febrile illness but I suspect pneumonia. He seems to be improving slowly and is defervescing. I would continue his current empiric antibiotic therapy for another 48 hours.  Plan: 1. Recommend  continuing the antibiotics for 2 more days 2. I will sign off now but please call if I can be of further assistance while he is here  Michel Bickers, [place] [facility] [place] [phone] pager   [phone] cell 05/06/2013, 5:10 PM

## 2013-05-06 NOTE — Evaluation (Signed)
Physical Therapy Evaluation Patient Details Name: Don Brown MRN: [id number] DOB: 1955-08-07 Today's Date: 05/06/2013 Time: 5277-8242 PT Time Calculation (min): 16 min  PT Assessment / Plan / Recommendation History of Present Illness   57 M with known chronic pancreatitis, AFib, HF, obesity, HTN, HLD, coming in with acute agitation and fever, has HHS + elevated lactate. Concern for possible DT vs intraabdominal process (pancreatitis vs infection vs bowel ischemia). Cannot rule out meningitis but less likely (no headache, no nuchal rigidity  Clinical Impression  Pt admitted with AMS.  Pt currently limited functionally due to the problems listed below.  (see problems list.)  Pt will benefit from PT to maximize function and safety to be able to get home safely with available assist , but if no assist available and not at Independent level, will help pursue other option.     PT Assessment  Patient needs continued PT services    Follow Up Recommendations  Home health PT    Does the patient have the potential to tolerate intense rehabilitation      Barriers to Discharge Decreased caregiver support      Equipment Recommendations  Other (comment) (TBA)    Recommendations for Other Services     Frequency Min 3X/week    Precautions / Restrictions Precautions Precautions: Fall   Pertinent Vitals/Pain VSS, but pt reporting feeling like going to pass out.  Unable to get BP's      Mobility  Bed Mobility Overal bed mobility: Needs Assistance Bed Mobility: Supine to Sit Supine to sit: Mod assist General bed mobility comments: physical guidance and assist due to weakness and lethargy. Transfers Overall transfer level: Needs assistance Transfers: Sit to/from Stand Sit to Stand: Mod assist;+2 safety/equipment General transfer comment: assist to come forward; stability assist    Exercises     PT Diagnosis: Difficulty walking;Generalized weakness  PT Problem List: Decreased  strength;Decreased activity tolerance;Decreased balance;Decreased mobility;Decreased safety awareness PT Treatment Interventions: Gait training;Stair training;Functional mobility training;Therapeutic activities;Balance training;Patient/family education     PT Goals(Current goals can be found in the care plan section) Acute Rehab PT Goals Patient Stated Goal: pt too lethargic to participate PT Goal Formulation: Patient unable to participate in goal setting Time For Goal Achievement: 05/20/13 Potential to Achieve Goals: Good  Visit Information  Last PT Received On: 05/06/13 Assistance Needed: +2 (for safety) History of Present Illness:  58 M with known chronic pancreatitis, AFib, HF, obesity, HTN, HLD, coming in with acute agitation and fever, has HHS + elevated lactate. Concern for possible DT vs intraabdominal process (pancreatitis vs infection vs bowel ischemia). Cannot rule out meningitis but less likely (no headache, no nuchal rigidity       Prior [place] expects to be discharged to:: Private residence Living Arrangements: Children (2 daughters 1 junior high, 1 high school age) Available Help at Discharge: Other (Comment) (TBA) Type of Home: Apartment Home Access: Stairs to enter Entrance Stairs-Number of Steps: 9 Entrance Stairs-Rails: Right;Left Home Layout: One level Home Equipment: Other (comment) (TBA  pt did not use assistive devices) Prior Function Level of Independence: Independent Comments: Pt Independent, used no devices, drove, but was on disability Communication Communication: No difficulties    Cognition  Cognition Arousal/Alertness: Lethargic Behavior During Therapy:  (flat and sleepy) Overall Cognitive Status: Impaired/Different from baseline    Extremity/Trunk Assessment Upper Extremity Assessment Upper Extremity Assessment: Defer to OT evaluation;Generalized weakness;Difficult to assess due to impaired cognition Lower  Extremity Assessment Lower Extremity Assessment: Generalized weakness  Balance Balance Overall balance assessment: Needs assistance Sitting-balance support: Single extremity supported Sitting balance-Leahy Scale: Fair Sitting balance - Comments: likely due to feeling bad, pt listed to L on occaision, but held himself propped on L elbow well Standing balance support: Single extremity supported Standing balance-Leahy Scale: Poor  End of Session PT - End of Session Equipment Utilized During Treatment: Oxygen Activity Tolerance: Patient limited by fatigue;Patient limited by lethargy Patient left: in bed;with call bell/phone within reach;Other (comment) (with SLP) Nurse Communication: Mobility status  GP     Ahlayah Tarkowski, Tessie Fass 05/06/2013, 11:18 AM 05/06/2013  Don Brown, PT [phone]  (pager)

## 2013-05-06 NOTE — Progress Notes (Signed)
1600 Pt got OOB and fell to floor hitting bottom and then falling over and hit head on bedside table, large stool in floor, pt awake and alert throughout  with VSS,  MD in box notified as well as Dr Earnest Conroy. Pt remains stable, alert & oriented, MAE, watching TV as of 1700

## 2013-05-07 DIAGNOSIS — R4182 Altered mental status, unspecified: Secondary | ICD-10-CM

## 2013-05-07 DIAGNOSIS — E1101 Type 2 diabetes mellitus with hyperosmolarity with coma: Secondary | ICD-10-CM

## 2013-05-07 DIAGNOSIS — J9600 Acute respiratory failure, unspecified whether with hypoxia or hypercapnia: Secondary | ICD-10-CM

## 2013-05-07 LAB — MAGNESIUM: Magnesium: 1.9 mg/dL (ref 1.5–2.5)

## 2013-05-07 LAB — GLUCOSE, CAPILLARY
GLUCOSE-CAPILLARY: 131 mg/dL — AB (ref 70–99)
Glucose-Capillary: 109 mg/dL — ABNORMAL HIGH (ref 70–99)
Glucose-Capillary: 121 mg/dL — ABNORMAL HIGH (ref 70–99)
Glucose-Capillary: 135 mg/dL — ABNORMAL HIGH (ref 70–99)
Glucose-Capillary: 198 mg/dL — ABNORMAL HIGH (ref 70–99)
Glucose-Capillary: 223 mg/dL — ABNORMAL HIGH (ref 70–99)
Glucose-Capillary: 243 mg/dL — ABNORMAL HIGH (ref 70–99)

## 2013-05-07 LAB — CULTURE, BLOOD (ROUTINE X 2)
CULTURE: NO GROWTH
Culture: NO GROWTH

## 2013-05-07 LAB — LACTATE DEHYDROGENASE: LDH: 265 U/L — ABNORMAL HIGH (ref 94–250)

## 2013-05-07 LAB — BASIC METABOLIC PANEL
BUN: 14 mg/dL (ref 6–23)
CALCIUM: 8.9 mg/dL (ref 8.4–10.5)
CO2: 33 meq/L — AB (ref 19–32)
CREATININE: 1.01 mg/dL (ref 0.50–1.35)
Chloride: 100 mEq/L (ref 96–112)
GFR calc Af Amer: >90 mL/min (ref >90)
GFR calc non Af Amer: 80 mL/min — ABNORMAL LOW (ref >90)
Glucose, Bld: 125 mg/dL — ABNORMAL HIGH (ref 70–99)
Potassium: 3.2 mEq/L — ABNORMAL LOW (ref 3.7–5.3)
Sodium: 144 mEq/L (ref 137–147)

## 2013-05-07 MED ORDER — OXYCODONE HCL 5 MG PO TABS
10.0000 mg | ORAL_TABLET | Freq: Four times a day (QID) | ORAL | Status: DC | PRN
  Administered 2013-05-09: 10 mg via ORAL
  Filled 2013-05-07: qty 2

## 2013-05-07 MED ORDER — BIOTENE DRY MOUTH MT LIQD
15.0000 mL | Freq: Two times a day (BID) | OROMUCOSAL | Status: DC
  Administered 2013-05-07 – 2013-05-16 (×19): 15 mL via OROMUCOSAL

## 2013-05-07 MED ORDER — HYDROCODONE-ACETAMINOPHEN 5-325 MG PO TABS
1.0000 | ORAL_TABLET | Freq: Four times a day (QID) | ORAL | Status: DC | PRN
  Administered 2013-05-07 – 2013-05-10 (×9): 1 via ORAL
  Filled 2013-05-07 (×9): qty 1

## 2013-05-07 MED ORDER — PANCRELIPASE (LIP-PROT-AMYL) 12000-38000 UNITS PO CPEP
2.0000 | ORAL_CAPSULE | Freq: Three times a day (TID) | ORAL | Status: DC
  Administered 2013-05-07 – 2013-05-17 (×25): 2 via ORAL
  Filled 2013-05-07 (×34): qty 2

## 2013-05-07 MED ORDER — POTASSIUM CHLORIDE CRYS ER 20 MEQ PO TBCR
40.0000 meq | EXTENDED_RELEASE_TABLET | Freq: Once | ORAL | Status: AC
  Administered 2013-05-07: 40 meq via ORAL
  Filled 2013-05-07: qty 2

## 2013-05-07 MED ORDER — INSULIN GLARGINE 100 UNIT/ML ~~LOC~~ SOLN
60.0000 [IU] | Freq: Every day | SUBCUTANEOUS | Status: DC
  Administered 2013-05-08 – 2013-05-10 (×3): 60 [IU] via SUBCUTANEOUS
  Filled 2013-05-07 (×4): qty 0.6

## 2013-05-07 MED ORDER — POTASSIUM CHLORIDE 10 MEQ/100ML IV SOLN: 10.0000 meq | INTRAVENOUS | Status: DC

## 2013-05-07 MED ORDER — ENOXAPARIN SODIUM 40 MG/0.4ML ~~LOC~~ SOLN
40.0000 mg | SUBCUTANEOUS | Status: DC
  Administered 2013-05-08: 40 mg via SUBCUTANEOUS
  Filled 2013-05-07 (×9): qty 0.4

## 2013-05-07 NOTE — Progress Notes (Signed)
PIV placed on 05/01/13 in Left forearm by ultrasound. Site looks good, flushes well, redressed 05/06/13, will leave in for now as pt is hard stick and observe, receiving RN in agreement

## 2013-05-07 NOTE — Progress Notes (Signed)
[facility] Progress Note [place] TEAM 1 - Stepdown/ICU TEAM   Don Brown OEU:[id number] DOB: 1955/08/25 DOA: 04/30/2013 PCP: Provider Not In System  Brief narrative: Don Brown is a 58 y.o. male presenting on 04/30/2013 with  has a past medical history of Pancreatitis; Atrial fibrillation; CHF (congestive heart failure); Morbid obesity; Chronic pain; Narcotic dependence, episodic use; Respiratory failure; History of renal failure; History of ventral hernia repair; Cervical compression fracture; Hypertension; Hyperlipidemia; Degenerative joint disease; and Heart murmur who presents with altered mental status. Per history obtained from family, he was drinking excess amount of fluid and V8 and urinating a lot for several days prior to admission. He was found fallen on the floor by his family in the bathroom. He was noted to have a fever on 102 and a glucose of 900 with lactic acidosis.   LINES / TUBES:  OETT 2/9 >>> 2/10; 2/10 >>> 2/13  Foley 2/8 >>>  R IJ TLC 2/9 >>> ???   CULTURES:  2/8 Blood >>> neg  2/9 MRSA >>> neg  2/11 Urine >>> neg   Subjective: Alert. No complaints.   Assessment/Plan: Principal Problem:   Fever  -  Acute respiratory failure - source not confirmed but suspected to be pneumonia- ID has been following - recommending cont antibiotics for 8 day - pt wanting O2 on although he room air pulse ox is > 95%  Active Problems:   Atrial fibrillation - rate controlled - will likely need anticoagulation but thrombocytopenic     CHF / Nonischemic cardiomyopathy - EF 20-25% with diffuse hypokinesis - Lasix, Dig and ACE    Hyperosmolar non-ketotic state in patient with type 2 diabetes mellitus - cont Lantus and sliding scale - sugars elevated- adjust insulin today     Lactic acidosis - resolved    Delirium, acute - resolved    Chronic pancreatitis - start pancreatic enzymes  H/o MGUS- 2 yrs ago - hematology has evaluated - recommending bone marrow bx  in future   Code Status: Full Family Communication: none Disposition Plan: transfer to tele- PT recommends HHPT  Consultants: ID  Antibiotics: Zosyn 2/9 >>>  Vancomycin 2/9 >>>    DVT prophylaxis: Start Lovenox - SCDs  Objective: Filed Weights   05/06/13 0400 05/07/13 0500 05/07/13 1311  Weight: 141.5 kg (311 lb 15.2 oz) 141.4 kg (311 lb 11.7 oz) 143 kg (315 lb 4.1 oz)   Blood pressure 118/78, pulse 60, temperature 97.6 F (36.4 C), temperature source Oral, resp. rate 20, height 6' (1.829 m), weight 143 kg (315 lb 4.1 oz), SpO2 100.00%.  Intake/Output Summary (Last 24 hours) at 05/07/13 1809 Last data filed at 05/07/13 1425  Gross per 24 hour  Intake    370 ml  Output    736 ml  Net   -366 ml     Exam: General: No acute respiratory distress Lungs: Clear to auscultation bilaterally without wheezes or crackles- 99% on 1 L Cardiovascular: Regular rate and rhythm without murmur gallop or rub normal S1 and S2 Abdomen: Nontender, nondistended, soft, bowel sounds positive, no rebound, no ascites, no appreciable mass Extremities: No significant cyanosis, clubbing, or edema bilateral lower extremities  Data Reviewed: Basic Metabolic Panel:  Recent Labs Lab 05/03/13 0057 05/03/13 0500 05/03/13 1249 05/04/13 0430 05/04/13 1500 05/05/13 0400 05/06/13 0235 05/07/13 0252  NA  --  148*  --  147  --  145 147 144  K  --  3.4*  --  3.6*  --  3.6* 3.3*  3.2*  CL  --  117*  --  111  --  105 102 100  CO2  --  21  --  24  --  29 34* 33*  GLUCOSE  --  181*  --  326*  --  340* 189* 125*  BUN  --  20  --  19  --  25* 15 14  CREATININE  --  1.27  --  1.19  --  1.14 0.86 1.01  CALCIUM  --  8.3*  --  8.2*  --  8.5 9.1 8.9  MG 1.7  --  1.8 1.6 2.1 1.8  --  1.9  PHOS 4.5  --  3.4 2.6 4.3 4.1  --   --    Liver Function Tests:  Recent Labs Lab 04/30/13 2127 05/01/13 0504 05/04/13 0430  AST '24 14 9  ' ALT '20 13 8  ' ALKPHOS 127* 76 53  BILITOT 0.4 0.4 0.6  PROT 8.8* 7.0 7.1   ALBUMIN 3.5 2.8* 2.4*    Recent Labs Lab 05/01/13 0050  LIPASE 15  AMYLASE 38    Recent Labs Lab 05/01/13 1330  AMMONIA 30   CBC:  Recent Labs Lab 04/30/13 2127  05/02/13 0426 05/02/13 1100 05/03/13 0500 05/04/13 0430 05/05/13 0400  WBC 5.1  < > 5.2 6.0 4.7 3.9* 3.6*  NEUTROABS 3.1  --   --   --   --   --   --   HGB 15.4  < > 15.3 13.5 12.2* 12.0* 12.9*  HCT 47.7  < > 47.3 41.1 38.4* 37.5* 41.2  MCV 87.7  < > 85.2 85.4 87.3 87.8 89.0  PLT 123*  < > 94* 106* 86* 75* 75*  < > = values in this interval not displayed. Cardiac Enzymes:  Recent Labs Lab 04/30/13 2130 05/01/13 0500 05/01/13 1100 05/01/13 1243 05/01/13 1843  TROPONINI <0.30 0.45* 0.38* 0.38* 0.31*   BNP (last 3 results) No results found for this basename: PROBNP,  in the last 8760 hours CBG:  Recent Labs Lab 05/07/13 0016 05/07/13 0420 05/07/13 0732 05/07/13 1123 05/07/13 1612  GLUCAP 135* 131* 121* 198* 223*    Recent Results (from the past 240 hour(s))  CULTURE, BLOOD (ROUTINE X 2)     Status: None   Collection Time    04/30/13 11:37 PM      Result Value Ref Range Status   Specimen Description BLOOD RIGHT HAND   Final   Special Requests BOTTLES DRAWN AEROBIC ONLY [facility]   Final   Culture  Setup Time     Final   Value: 05/01/2013 08:20     Performed at [facility]   Culture     Final   Value: NO GROWTH 5 DAYS     Performed at [facility]   Report Status 05/07/2013 FINAL   Final  CULTURE, BLOOD (ROUTINE X 2)     Status: None   Collection Time    04/30/13 11:47 PM      Result Value Ref Range Status   Specimen Description BLOOD LEFT HAND   Final   Special Requests BOTTLES DRAWN AEROBIC AND ANAEROBIC [facility] EACH   Final   Culture  Setup Time     Final   Value: 05/01/2013 08:20     Performed at [facility]   Culture     Final   Value: NO GROWTH 5 DAYS     Performed at [facility]   Report Status 05/07/2013  FINAL   Final  MRSA PCR SCREENING      Status: None   Collection Time    05/01/13  6:16 AM      Result Value Ref Range Status   MRSA by PCR NEGATIVE  NEGATIVE Final   Comment:            The GeneXpert MRSA Assay (FDA     approved for NASAL specimens     only), is one component of a     comprehensive MRSA colonization     surveillance program. It is not     intended to diagnose MRSA     infection nor to guide or     monitor treatment for     MRSA infections.  URINE CULTURE     Status: None   Collection Time    05/03/13 11:40 AM      Result Value Ref Range Status   Specimen Description URINE, CATHETERIZED   Final   Special Requests NONE   Final   Culture  Setup Time     Final   Value: 05/03/2013 12:30     Performed at [facility] Count     Final   Value: NO GROWTH     Performed at [facility]   Culture     Final   Value: NO GROWTH     Performed at [facility]   Report Status 05/04/2013 FINAL   Final  RESPIRATORY VIRUS PANEL     Status: None   Collection Time    05/03/13  1:51 PM      Result Value Ref Range Status   Source - RVPAN TRACHEAL ASPIRATE   Final   Respiratory Syncytial Virus A NOT DETECTED   Final   Respiratory Syncytial Virus B NOT DETECTED   Final   Influenza A NOT DETECTED   Final   Influenza B NOT DETECTED   Final   Parainfluenza 1 NOT DETECTED   Final   Parainfluenza 2 NOT DETECTED   Final   Parainfluenza 3 NOT DETECTED   Final   Metapneumovirus NOT DETECTED   Final   Rhinovirus NOT DETECTED   Final   Adenovirus NOT DETECTED   Final   Influenza A H1 NOT DETECTED   Final   Influenza A H3 NOT DETECTED   Final   Comment: (NOTE)           Normal Reference Range for each Analyte: NOT DETECTED     Testing performed using the Luminex xTAG Respiratory Viral Panel test     kit.     This test was developed and its performance characteristics determined     by [facility]. It has not been cleared or approved by the [place]     Food and Drug Administration.  This test is used for clinical purposes.     It should not be regarded as investigational or for research. This     laboratory is certified under the [place] (CLIA) as qualified to perform high complexity     clinical laboratory testing.     Performed at [facility] DIFFICILE BY PCR     Status: None   Collection Time    05/06/13  6:06 PM      Result Value Ref Range Status   C difficile by pcr NEGATIVE  NEGATIVE Final     Studies:  Recent x-ray  studies have been reviewed in detail by the Attending Physician  Scheduled Meds:  Scheduled Meds: . antiseptic oral rinse  15 mL Mouth Rinse BID  . aspirin  325 mg Oral Daily  . carvedilol  3.125 mg Oral BID WC  . digoxin  0.125 mg Oral Daily  . folic acid  1 mg Oral Daily  . insulin aspart  0-15 Units Subcutaneous 6 times per day  . insulin glargine  55 Units Subcutaneous Daily  . lipase/protease/amylase  2 capsule Oral TID WC  . lisinopril  10 mg Oral Daily  . multivitamin  5 mL Oral Daily  . piperacillin-tazobactam (ZOSYN)  IV  3.375 g Intravenous 3 times per day  . pneumococcal 23 valent vaccine  0.5 mL Intramuscular Tomorrow-1000  . thiamine  100 mg Oral Daily   Continuous Infusions:   Time spent on care of this patient: 34 min   Debbe Odea, MD  [facility] Office  [phone] Pager - Text Page per Shea Evans as per below:  On-Call/Text Page:      [email or web address]      password [email or web address]  If 7PM-7AM, please contact night-coverage [email or web address] Password [email or web address] 05/07/2013, 6:09 PM   LOS: 7 days

## 2013-05-07 NOTE — Progress Notes (Signed)
ANTIBIOTIC CONSULT NOTE - FOLLOW UP  Pharmacy Consult for zosyn Indication: PNA  Allergies  Allergen Reactions  . Morphine And Related Nausea And Vomiting    Patient Measurements: Height: 6' 0.05" (183 cm) Weight: 311 lb 11.7 oz (141.4 kg) IBW/kg (Calculated) : 77.71   Vital Signs: Temp: 98.3 F (36.8 C) (02/15 0900) BP: 102/65 mmHg (02/15 1200) Pulse Rate: 54 (02/15 0900) Intake/Output from previous day: 02/14 0701 - 02/15 0700 In: 1075 [P.O.:575; I.V.:115; IV Piggyback:375] Out: 1060 [Urine:1060] Intake/Output from this shift: Total I/O In: 275 [P.O.:250; IV Piggyback:25] Out: 100 [Urine:100]  Labs:  Recent Labs  05/05/13 0400 05/06/13 0235 05/07/13 0252  WBC 3.6*  --   --   HGB 12.9*  --   --   PLT 75*  --   --   CREATININE 1.14 0.86 1.01   Estimated Creatinine Clearance: 116.4 ml/min (by C-G formula based on Cr of 1.01). No results found for this basename: VANCOTROUGH, VANCOPEAK, VANCORANDOM, GENTTROUGH, GENTPEAK, GENTRANDOM, TOBRATROUGH, TOBRAPEAK, TOBRARND, AMIKACINPEAK, AMIKACINTROU, AMIKACIN,  in the last 72 hours    Assessment: 58 yo male on zosyn for suspected PNA. SCr= 1/01 and CrCl  >100. Today is day 7 of therapy and total duration is 8 days per ID  Zosyn 2/9 >> 2/14  2/12 VT = 20.7 mcg/mL (on 1500 q12, SCr 1.19)  2/11 RVP - neg 2/11 UCx - neg 2/8 BCx x2 - NGTD  Plan:  -No zosyn dose changes needed -Consider adding a stop date for zosyn -Will sign off for now. Please contact pharmacy for any other needs  Hildred Laser, Sherian Rein D 05/07/2013 12:30 PM

## 2013-05-07 NOTE — Progress Notes (Signed)
C-diff negative, enteric precautions discontinued

## 2013-05-07 NOTE — Progress Notes (Signed)
[facility] ADULT ICU REPLACEMENT PROTOCOL FOR AM LAB REPLACEMENT ONLY  The patient does not apply for the [facility] Adult ICU Electrolyte Replacment Protocol based on the criteria listed below:   1. Is GFR >/= 40 ml/min? yes  Patient's GFR today is >90 2. Is urine output >/= 0.5 ml/kg/hr for the last 6 hours? no Patient's UOP is (none recorded all night) ml/kg/hr 3. Is BUN < 60 mg/dL? yes  Patient's BUN today is 14 4. Abnormal electrolyte(s): K+3.2 5. Ordered repletion with:  NA 6. If a panic level lab has been reported, has the CCM MD in charge been notified? yes.   Physician:  Dr Levin Bacon, Eddie Dibbles [facility] 05/07/2013 6:12 AM

## 2013-05-08 LAB — PROTEIN ELECTROPHORESIS, SERUM
Albumin ELP: 45 % — ABNORMAL LOW (ref 55.8–66.1)
Alpha-1-Globulin: 5.6 % — ABNORMAL HIGH (ref 2.9–4.9)
Alpha-2-Globulin: 11.8 % (ref 7.1–11.8)
BETA GLOBULIN: 5.8 % (ref 4.7–7.2)
Beta 2: 3.5 % (ref 3.2–6.5)
Gamma Globulin: 28.3 % — ABNORMAL HIGH (ref 11.1–18.8)
M-SPIKE, %: 1.65 g/dL
Total Protein ELP: 6.7 g/dL (ref 6.0–8.3)

## 2013-05-08 LAB — BASIC METABOLIC PANEL
BUN: 16 mg/dL (ref 6–23)
CHLORIDE: 98 meq/L (ref 96–112)
CO2: 34 meq/L — AB (ref 19–32)
Calcium: 9 mg/dL (ref 8.4–10.5)
Creatinine, Ser: 1.15 mg/dL (ref 0.50–1.35)
GFR calc Af Amer: 79 mL/min — ABNORMAL LOW (ref >90)
GFR calc non Af Amer: 68 mL/min — ABNORMAL LOW (ref >90)
Glucose, Bld: 148 mg/dL — ABNORMAL HIGH (ref 70–99)
POTASSIUM: 4.2 meq/L (ref 3.7–5.3)
Sodium: 141 mEq/L (ref 137–147)

## 2013-05-08 LAB — GLUCOSE, CAPILLARY
Glucose-Capillary: 100 mg/dL — ABNORMAL HIGH (ref 70–99)
Glucose-Capillary: 123 mg/dL — ABNORMAL HIGH (ref 70–99)
Glucose-Capillary: 173 mg/dL — ABNORMAL HIGH (ref 70–99)
Glucose-Capillary: 176 mg/dL — ABNORMAL HIGH (ref 70–99)
Glucose-Capillary: 202 mg/dL — ABNORMAL HIGH (ref 70–99)

## 2013-05-08 LAB — UIFE/LIGHT CHAINS/TP QN, 24-HR UR
ALBUMIN, U: DETECTED
ALPHA 1 UR: DETECTED — AB
Alpha 2, Urine: DETECTED — AB
Beta, Urine: DETECTED — AB
FREE KAPPA/LAMBDA RATIO: 54.67 ratio — AB (ref 2.04–10.37)
FREE LAMBDA LT CHAINS, UR: 0.92 mg/dL — AB (ref 0.02–0.67)
Free Kappa Lt Chains,Ur: 50.3 mg/dL — ABNORMAL HIGH (ref 0.14–2.42)
Free Lambda Excretion/Day: 46.46 mg/d
Free Lt Chn Excr Rate: 2540.15 mg/d
Gamma Globulin, Urine: DETECTED — AB
TIME-UPE24: 24 h
TOTAL PROTEIN, URINE-UPE24: 55.6 mg/dL
Total Protein, Urine-Ur/day: 2808 mg/d — ABNORMAL HIGH (ref 10–140)
VOLUME, URINE-UPE24: 5050 mL

## 2013-05-08 LAB — KAPPA/LAMBDA LIGHT CHAINS
KAPPA FREE LGHT CHN: 16.3 mg/dL — AB (ref 0.33–1.94)
Kappa, lambda light chain ratio: 13.25 — ABNORMAL HIGH (ref 0.26–1.65)
LAMDA FREE LIGHT CHAINS: 1.23 mg/dL (ref 0.57–2.63)

## 2013-05-08 MED ORDER — GABAPENTIN 100 MG PO CAPS
100.0000 mg | ORAL_CAPSULE | Freq: Three times a day (TID) | ORAL | Status: DC
  Administered 2013-05-08 (×2): 100 mg via ORAL
  Filled 2013-05-08 (×3): qty 1

## 2013-05-08 MED ORDER — VANCOMYCIN HCL 10 G IV SOLR
1500.0000 mg | Freq: Once | INTRAVENOUS | Status: AC
  Administered 2013-05-08: 1500 mg via INTRAVENOUS
  Filled 2013-05-08: qty 1500

## 2013-05-08 MED ORDER — METOPROLOL TARTRATE 12.5 MG HALF TABLET
12.5000 mg | ORAL_TABLET | Freq: Two times a day (BID) | ORAL | Status: DC
  Administered 2013-05-08 – 2013-05-16 (×17): 12.5 mg via ORAL
  Filled 2013-05-08 (×21): qty 1

## 2013-05-08 MED ORDER — VANCOMYCIN HCL 10 G IV SOLR
2500.0000 mg | Freq: Once | INTRAVENOUS | Status: AC
  Administered 2013-05-08: 2500 mg via INTRAVENOUS
  Filled 2013-05-08: qty 2500

## 2013-05-08 MED ORDER — LIVING WELL WITH DIABETES BOOK
Freq: Once | Status: AC
  Administered 2013-05-08: 15:00:00
  Filled 2013-05-08: qty 1

## 2013-05-08 MED ORDER — GABAPENTIN 100 MG PO CAPS
100.0000 mg | ORAL_CAPSULE | Freq: Two times a day (BID) | ORAL | Status: DC
  Filled 2013-05-08 (×2): qty 1

## 2013-05-08 MED ORDER — ACETAMINOPHEN 160 MG/5ML PO SOLN
650.0000 mg | Freq: Four times a day (QID) | ORAL | Status: DC | PRN
  Filled 2013-05-08: qty 20.3

## 2013-05-08 MED ORDER — PIPERACILLIN-TAZOBACTAM 3.375 G IVPB
3.3750 g | Freq: Three times a day (TID) | INTRAVENOUS | Status: AC
  Administered 2013-05-08 (×2): 3.375 g via INTRAVENOUS
  Filled 2013-05-08 (×2): qty 50

## 2013-05-08 MED ORDER — INSULIN ASPART 100 UNIT/ML ~~LOC~~ SOLN
0.0000 [IU] | Freq: Three times a day (TID) | SUBCUTANEOUS | Status: DC
  Administered 2013-05-09: 8 [IU] via SUBCUTANEOUS
  Administered 2013-05-09: 2 [IU] via SUBCUTANEOUS

## 2013-05-08 NOTE — Progress Notes (Signed)
Patient self administered insulin for 4 pm dose.  Stated"not too bad."

## 2013-05-08 NOTE — Progress Notes (Signed)
ANTIBIOTIC CONSULT NOTE - FOLLOW UP  Pharmacy Consult for Zosyn, Vancomycin Indication: PNA  Allergies  Allergen Reactions  . Morphine And Related Nausea And Vomiting    Patient Measurements: Height: 6' (182.9 cm) Weight: 315 lb (142.883 kg) IBW/kg (Calculated) : 77.6   Vital Signs: Temp: 98 F (36.7 C) (02/16 1344) Temp src: Oral (02/16 1344) BP: 137/82 mmHg (02/16 1350) Pulse Rate: 73 (02/16 1350) Intake/Output from previous day: 02/15 0701 - 02/16 0700 In: 615 [P.O.:490; IV Piggyback:125] Out: 701 [Urine:700; Stool:1] Intake/Output from this shift: Total I/O In: 480 [P.O.:480] Out: 200 [Urine:200]  Labs:  Recent Labs  05/06/13 0235 05/07/13 0252 05/08/13 0621  CREATININE 0.86 1.01 1.15   Estimated Creatinine Clearance: 102.7 ml/min (by C-G formula based on Cr of 1.15). No results found for this basename: VANCOTROUGH, VANCOPEAK, VANCORANDOM, GENTTROUGH, GENTPEAK, GENTRANDOM, TOBRATROUGH, TOBRAPEAK, TOBRARND, AMIKACINPEAK, AMIKACINTROU, AMIKACIN,  in the last 72 hours    Assessment: 58 yo male on zosyn for suspected PNA. SCr= 1.15 and CrCl  >100. Today is day  of therapy and total duration is 8 days per ID.  Pharmacy asked to resume vancomycin today for just 1 day (to complete therapy).  Zosyn 2/9 >> 2/14  2/12 VT = 20.7 mcg/mL (on 1500 q12, SCr 1.19)  2/11 RVP - neg 2/11 UCx - neg 2/8 BCx x2 - neg  Plan:  -No zosyn dose changes needed -Consider adding a stop date for zosyn -Will give vancomycin 2500 mg x 1, then 1500 mg 12 hrs later.  Then will d/c. -Pharmacy to sign-off.  Uvaldo Rising, BCPS  Clinical Pharmacist Pager [phone]  05/08/2013 1:56 PM

## 2013-05-08 NOTE — Progress Notes (Signed)
Inpatient Diabetes Program Recommendations  AACE/ADA: New Consensus Statement on Inpatient Glycemic Control (2013)  Target Ranges:  Prepandial:   less than 140 mg/dL      Peak postprandial:   less than 180 mg/dL (1-2 hours)      Critically ill patients:  140 - 180 mg/dL   Reason for Visit: Diabetes Coordinator Consult  Diabetes history: New-onset diabetes Outpatient Diabetes medications: N/A Current orders for Inpatient glycemic control: Lantus 60 units, Novolog Moderate correction  Note: Patient with new-onset diabetes.  A1C of 12.5.  Patient refused to inject insulin earlier today.  Has watched one video regarding diabetes on the Patient Education Network but "became dizzy and couldn't concentrate...had to stop".  Patient has multiple stressors-- two children at home, one with a slight learning disability and one who is autistic.  Health related stressors- particularly with chronic pancreatitis.  "Ever since the [place] decided to take the stents in my pancreas out, I have been on a roller coaster with pain from the pancreatitis."    [facility] but there has been some delay regarding his receiving more benefits since he is now considered service-connected.  With the new diagnosis of diabetes, not sure how soon he can get his insulins filled by the [place], when he will get a glucose meter, etc.  States that someone is trying to sort through this for him, but there is no note in record regarding this-- so referral placed to Care Management for assistance.  Gave patient information regarding a generic meter from [employer] and generic insulins available from [employer] in case he is unable to obtain Lantus and Novolog through the [place] in a timely way.  Discussed with patient the importance of learning as much as he can about diabetes.  Encouraged him to start giving his injections and letting the nursing staff instruct him regarding insulin preparation.  Briefly showed him how an insulin  pen works in case he is eventually able to get insulin pens through the [place].  Encouraged him to watch the Pt Ed [employer].  Ordered a Living with Diabetes booklet.  Ordered a dietitian consult.  PT has recommended Home Health PT after discharge.  Patient would benefit from [place] visits as well to make sure he understands basic diabetes self-care, gives insulin correctly, and checks CBG's correctly.  Thank you.  Dajanee Voorheis S. Marcelline Mates, RN, CNS, CDE Inpatient Diabetes Program, team pager [phone]

## 2013-05-08 NOTE — Progress Notes (Signed)
Physical Therapy Treatment Patient Details Name: Jabaree Mercado MRN: [id number] DOB: February 03, 1956 Today's Date: 05/08/2013 Time: 9163-8466 PT Time Calculation (min): 35 min  PT Assessment / Plan / Recommendation  History of Present Illness  82 M with known chronic pancreatitis, AFib, HF, obesity, HTN, HLD, coming in with acute agitation and fever, has HHS + elevated lactate. Concern for possible DT vs intraabdominal process (pancreatitis vs infection vs bowel ischemia). Cannot rule out meningitis but less likely (no headache, no nuchal rigidity   PT Comments   Pt making marked progress with mobility during session.  Ambulated bed to/from doorway without O2 to assess needs for home O2 use.  Prior to gait was 95% on RA and following short distance was 94%.  RN notified.  Ambulated another 200' with RW at min assist with SaO2 at 92%, then down to 86% with increased gait speed.  Had pt lean against wall for standing rest break with pt able to increase sats to 92%.  Left pt off of O2 in room.  Will have PT see pt tomorrow for stair training prior to d/c home.   Follow Up Recommendations  Home health PT     Does the patient have the potential to tolerate intense rehabilitation     Barriers to Discharge        Equipment Recommendations  Rolling walker with 5" wheels    Recommendations for Other Services    Frequency Min 3X/week   Progress towards PT Goals Progress towards PT goals: Progressing toward goals  Plan Current plan remains appropriate    Precautions / Restrictions Precautions Precautions: Fall   Pertinent Vitals/Pain No pain    Mobility  Bed Mobility Overal bed mobility: Modified Independent General bed mobility comments: Pt able to perform with increased time and effort, but able to perform on his own.  Transfers Overall transfer level: Needs assistance Equipment used: Rolling walker (2 wheeled) Transfers: Sit to/from Stand Sit to Stand: Min guard;Supervision General transfer  comment: Min/guard for safety and mild steadying with cues for hand placement and safety with controlled descent.   Ambulation/Gait Ambulation/Gait assistance: Min assist Ambulation Distance (Feet): 200 Feet (15 x 1, and another 100 x 1) Assistive device: Rolling walker (2 wheeled) Gait Pattern/deviations: Step-through pattern;Trunk flexed;Wide base of support General Gait Details: mod cues for decreased gait speed for increased safety and also safe use of RW    Exercises     PT Diagnosis:    PT Problem List:   PT Treatment Interventions:     PT Goals (current goals can now be found in the care plan section) Acute Rehab PT Goals PT Goal Formulation: With patient Time For Goal Achievement: 05/20/13 Potential to Achieve Goals: Good  Visit Information  Last PT Received On: 05/08/13 Assistance Needed: +1 History of Present Illness:  59 M with known chronic pancreatitis, AFib, HF, obesity, HTN, HLD, coming in with acute agitation and fever, has HHS + elevated lactate. Concern for possible DT vs intraabdominal process (pancreatitis vs infection vs bowel ischemia). Cannot rule out meningitis but less likely (no headache, no nuchal rigidity    Subjective Data      Cognition  Cognition Arousal/Alertness: Awake/alert Behavior During Therapy: WFL for tasks assessed/performed Overall Cognitive Status: Within Functional Limits for tasks assessed    Balance     End of Session PT - End of Session Equipment Utilized During Treatment: Gait belt Activity Tolerance: Patient limited by fatigue;Patient limited by lethargy Patient left: in bed;with call bell/phone within reach  Nurse Communication: Mobility status   GP     Denice Bors 05/08/2013, 3:00 PM

## 2013-05-08 NOTE — Progress Notes (Signed)
Dr. Thereasa Solo notified that patient is still complaining of pain in right upper quadrant 1 hour after pain medication administered.  Attempted to have patient perform self administration of insulin.  Patient refused.

## 2013-05-08 NOTE — Progress Notes (Signed)
Progress Note [place] TEAM 1 - Stepdown/ICU TEAM   Stuart Mirabile SWF:[id number] DOB: 08-26-1955 DOA: 04/30/2013 PCP: Provider Not In System  Brief narrative: 58 y.o. male who presented on 04/30/2013 with a past medical history of Chronic Pancreatitis; Atrial fibrillation; CHF; Morbid obesity; Chronic pain; Narcotic dependence; Respiratory failure; History of renal failure; History of ventral hernia repair; Cervical compression fracture; Hypertension; Hyperlipidemia; Degenerative joint disease; and Heart murmur who c/o altered mental status. Per history obtained from family, he was drinking excess amounts of fluid and V8 and urinating a lot for several days prior to admission. He was found on the floor by his family in the bathroom. He was noted to have a fever of 102 and a glucose of 900 with lactic acidosis.   LINES / TUBES:  OETT 2/9 >>> 2/10; 2/10 >>> 2/13  Foley 2/8 >>>  R IJ TLC 2/9 >>> ???   CULTURES:  2/8 Blood >>> neg  2/9 MRSA >>> neg  2/11 Urine >>> neg  Subjective: Alert. Patient with multiple complaints. Endorses what appears to be chronic epigastric and abdominal discomfort he relates to his hernias. Had questions as to why prior surgical consult as an outpatient would not fix his hernias. Discussed issues with numbness and tingling in his left leg but when queried was unable to do definitively state if actually had weakness on leg when attempting to ambulate. Described what he calls 2 episodes of falling but when further questioned actually did not fall or hit the floor. No current chest pain but does endorse shortness of breath that specific pattern for onset. Also complaining of floaters in eyes which is chronic.  Assessment/Plan:    Fever  -  Acute respiratory failure with hypoxia - source not confirmed but suspected to be pneumonia - ID has been following - ID recommended last dose vancomycin on 2/16 noting it appears vancomycin was discontinued on 2/14 and patient did not  receive dose on 2/15 so have reordered dosing today; likely can discontinue Zosyn tomorrow on 2/17 - pt wanting O2 on although his room air pulse ox is > 95%    Atrial fibrillation - rate controlled - anticoag has not been intitiated due to thrombocytopenia     CHF / Nonischemic cardiomyopathy - EF 20-25% with diffuse hypokinesis - Lasix, Dig and ACE    Hyperosmolar non-ketotic state in patient with new onset type 2 diabetes mellitus - cont Lantus and sliding scale - Hemoglobin A1c 12.5 -will need insulin at discharge -Consult diabetes educator and nutrition  -CBGs better controlled past 24 hours after adjustment in insulin dose -Patient also describes floaters and other visual disturbances consistent with non-acute eye problems that need to be evaluated by his ophthalmologist after discharge noting patient receives all of his primary care at the [facility] in [place] - pt educated on need for ongoing eye exams    Left lower extremity numbness and tingling -Suspect related to neuropathy in patient with newly diagnosed diabetes that is uncontrolled -Could also be radiculopathy and given patient's morbid obesity I suspect he does have a degree of degenerative disc disease -no worrisome acute findings at present    Questionable syncopal activity -Patient describes what he calls "falls" but after discussion with him does not appear he actually fell -Check orthostatic vital signs and have PT evaluate patient    Lactic acidosis - resolved    Delirium, acute - resolved    Chronic pancreatitis - Resume pancreatic enzymes -Patient continues with some nonspecific pain in  the upper abdomen -Check lipase and LFTs apparent history of prior biliary or pancreatic stent  H/o MGUS- 2 yrs ago - hematology has evaluated - recommending bone marrow bx in future -Previously saw Shadad.please review outpatient notes  Code Status: Full Family Communication: none Disposition Plan: telemetry - PT  eval   Consultants: ID  Antibiotics: Zosyn 2/9 >>>  Vancomycin 2/9 >>>   DVT prophylaxis: Lovenox - SCDs  Objective: Filed Weights   05/07/13 0500 05/07/13 1311 05/08/13 0500  Weight: 311 lb 11.7 oz (141.4 kg) 315 lb 4.1 oz (143 kg) 315 lb (142.883 kg)   Blood pressure 137/82, pulse 73, temperature 98 F (36.7 C), temperature source Oral, resp. rate 20, height 6' (1.829 m), weight 315 lb (142.883 kg), SpO2 96.00%.  Intake/Output Summary (Last 24 hours) at 05/08/13 1441 Last data filed at 05/08/13 1342  Gross per 24 hour  Intake    820 ml  Output    500 ml  Net    320 ml   Exam: General: No acute respiratory distress Lungs: Clear to auscultation bilaterally without wheezes or crackles- 99% on 2 L Cardiovascular: Regular rate and rhythm without murmur gallop or rub normal S1 and S2 Abdomen: Nontender over upper abdomen and epigastrium with palpation, nondistended, soft, bowel sounds positive, no rebound, no ascites, no appreciable mass-hernias soft and nontender and do not appear to be incarcerated - large abdom scar w/o acute findings  Extremities: No significant cyanosis, clubbing, or edema bilateral lower extremities  Data Reviewed: Basic Metabolic Panel:  Recent Labs Lab 05/03/13 0057  05/03/13 1249 05/04/13 0430 05/04/13 1500 05/05/13 0400 05/06/13 0235 05/07/13 0252 05/08/13 0621  NA  --   < >  --  147  --  145 147 144 141  K  --   < >  --  3.6*  --  3.6* 3.3* 3.2* 4.2  CL  --   < >  --  111  --  105 102 100 98  CO2  --   < >  --  24  --  29 34* 33* 34*  GLUCOSE  --   < >  --  326*  --  340* 189* 125* 148*  BUN  --   < >  --  19  --  25* '15 14 16  ' CREATININE  --   < >  --  1.19  --  1.14 0.86 1.01 1.15  CALCIUM  --   < >  --  8.2*  --  8.5 9.1 8.9 9.0  MG 1.7  --  1.8 1.6 2.1 1.8  --  1.9  --   PHOS 4.5  --  3.4 2.6 4.3 4.1  --   --   --   < > = values in this interval not displayed.  Liver Function Tests:  Recent Labs Lab 05/04/13 0430  AST 9  ALT  8  ALKPHOS 53  BILITOT 0.6  PROT 7.1  ALBUMIN 2.4*   No results found for this basename: LIPASE, AMYLASE,  in the last 168 hours No results found for this basename: AMMONIA,  in the last 168 hours  CBC:  Recent Labs Lab 05/02/13 0426 05/02/13 1100 05/03/13 0500 05/04/13 0430 05/05/13 0400  WBC 5.2 6.0 4.7 3.9* 3.6*  HGB 15.3 13.5 12.2* 12.0* 12.9*  HCT 47.3 41.1 38.4* 37.5* 41.2  MCV 85.2 85.4 87.3 87.8 89.0  PLT 94* 106* 86* 75* 75*   Cardiac Enzymes:  Recent Labs Lab 05/01/13 1843  TROPONINI 0.31*   CBG:  Recent Labs Lab 05/07/13 2016 05/07/13 2344 05/08/13 0507 05/08/13 0735 05/08/13 1137  GLUCAP 243* 109* 100* 123* 173*    Recent Results (from the past 240 hour(s))  CULTURE, BLOOD (ROUTINE X 2)     Status: None   Collection Time    04/30/13 11:37 PM      Result Value Ref Range Status   Specimen Description BLOOD RIGHT HAND   Final   Special Requests BOTTLES DRAWN AEROBIC ONLY [facility]   Final   Culture  Setup Time     Final   Value: 05/01/2013 08:20     Performed at [facility]   Culture     Final   Value: NO GROWTH 5 DAYS     Performed at [facility]   Report Status 05/07/2013 FINAL   Final  CULTURE, BLOOD (ROUTINE X 2)     Status: None   Collection Time    04/30/13 11:47 PM      Result Value Ref Range Status   Specimen Description BLOOD LEFT HAND   Final   Special Requests BOTTLES DRAWN AEROBIC AND ANAEROBIC [facility] EACH   Final   Culture  Setup Time     Final   Value: 05/01/2013 08:20     Performed at [facility]   Culture     Final   Value: NO GROWTH 5 DAYS     Performed at [facility]   Report Status 05/07/2013 FINAL   Final  MRSA PCR SCREENING     Status: None   Collection Time    05/01/13  6:16 AM      Result Value Ref Range Status   MRSA by PCR NEGATIVE  NEGATIVE Final   Comment:            The GeneXpert MRSA Assay (FDA     approved for NASAL specimens     only), is one component of a      comprehensive MRSA colonization     surveillance program. It is not     intended to diagnose MRSA     infection nor to guide or     monitor treatment for     MRSA infections.  URINE CULTURE     Status: None   Collection Time    05/03/13 11:40 AM      Result Value Ref Range Status   Specimen Description URINE, CATHETERIZED   Final   Special Requests NONE   Final   Culture  Setup Time     Final   Value: 05/03/2013 12:30     Performed at [facility]     Final   Value: NO GROWTH     Performed at [facility]   Culture     Final   Value: NO GROWTH     Performed at [facility]   Report Status 05/04/2013 FINAL   Final  RESPIRATORY VIRUS PANEL     Status: None   Collection Time    05/03/13  1:51 PM      Result Value Ref Range Status   Source - RVPAN TRACHEAL ASPIRATE   Final   Respiratory Syncytial Virus A NOT DETECTED   Final   Respiratory Syncytial Virus B NOT DETECTED   Final   Influenza A NOT DETECTED   Final   Influenza B NOT DETECTED   Final   Parainfluenza 1 NOT DETECTED  Final   Parainfluenza 2 NOT DETECTED   Final   Parainfluenza 3 NOT DETECTED   Final   Metapneumovirus NOT DETECTED   Final   Rhinovirus NOT DETECTED   Final   Adenovirus NOT DETECTED   Final   Influenza A H1 NOT DETECTED   Final   Influenza A H3 NOT DETECTED   Final   Comment: (NOTE)           Normal Reference Range for each Analyte: NOT DETECTED     Testing performed using the Luminex xTAG Respiratory Viral Panel test     kit.     This test was developed and its performance characteristics determined     by [facility]. It has not been cleared or approved by the [place]     Food and Drug Administration. This test is used for clinical purposes.     It should not be regarded as investigational or for research. This     laboratory is certified under the [place] (CLIA) as qualified to perform high complexity      clinical laboratory testing.     Performed at [facility] DIFFICILE BY PCR     Status: None   Collection Time    05/06/13  6:06 PM      Result Value Ref Range Status   C difficile by pcr NEGATIVE  NEGATIVE Final     Studies:  Recent x-ray studies have been reviewed in detail by the Attending Physician  Scheduled Meds:  Scheduled Meds: . antiseptic oral rinse  15 mL Mouth Rinse BID  . aspirin  325 mg Oral Daily  . digoxin  0.125 mg Oral Daily  . enoxaparin (LOVENOX) injection  40 mg Subcutaneous Q24H  . folic acid  1 mg Oral Daily  . gabapentin  100 mg Oral TID  . insulin aspart  0-15 Units Subcutaneous 6 times per day  . insulin glargine  60 Units Subcutaneous Daily  . lipase/protease/amylase  2 capsule Oral TID WC  . lisinopril  10 mg Oral Daily  . metoprolol tartrate  12.5 mg Oral BID  . multivitamin  5 mL Oral Daily  . piperacillin-tazobactam (ZOSYN)  IV  3.375 g Intravenous 3 times per day  . pneumococcal 23 valent vaccine  0.5 mL Intramuscular Tomorrow-1000  . thiamine  100 mg Oral Daily  . vancomycin  1,500 mg Intravenous Once    Time spent on care of this patient: 35 min   ELLIS,ALLISON L., ANP  [facility] Office  [phone] Pager - Text Page per Shea Evans as per below:  On-Call/Text Page:      [email or web address]      password [email or web address]  If 7PM-7AM, please contact night-coverage [email or web address] Password [email or web address] 05/08/2013, 2:41 PM   LOS: 8 days   I have personally examined this patient and reviewed the entire database. I have reviewed the above note, made any necessary editorial changes, and agree with its content.  Cherene Altes, MD [facility]

## 2013-05-09 DIAGNOSIS — G609 Hereditary and idiopathic neuropathy, unspecified: Secondary | ICD-10-CM

## 2013-05-09 DIAGNOSIS — E872 Acidosis, unspecified: Secondary | ICD-10-CM

## 2013-05-09 DIAGNOSIS — I428 Other cardiomyopathies: Secondary | ICD-10-CM

## 2013-05-09 DIAGNOSIS — E876 Hypokalemia: Secondary | ICD-10-CM

## 2013-05-09 LAB — CBC
HEMATOCRIT: 39 % (ref 39.0–52.0)
HEMOGLOBIN: 12.6 g/dL — AB (ref 13.0–17.0)
MCH: 28.6 pg (ref 26.0–34.0)
MCHC: 32.3 g/dL (ref 30.0–36.0)
MCV: 88.4 fL (ref 78.0–100.0)
Platelets: 161 10*3/uL (ref 150–400)
RBC: 4.41 MIL/uL (ref 4.22–5.81)
RDW: 15.9 % — ABNORMAL HIGH (ref 11.5–15.5)
WBC: 4.3 10*3/uL (ref 4.0–10.5)

## 2013-05-09 LAB — LIPASE, BLOOD: Lipase: 471 U/L — ABNORMAL HIGH (ref 11–59)

## 2013-05-09 LAB — COMPREHENSIVE METABOLIC PANEL
ALK PHOS: 52 U/L (ref 39–117)
ALT: 11 U/L (ref 0–53)
AST: 17 U/L (ref 0–37)
Albumin: 2.7 g/dL — ABNORMAL LOW (ref 3.5–5.2)
BUN: 24 mg/dL — ABNORMAL HIGH (ref 6–23)
CO2: 29 mEq/L (ref 19–32)
Calcium: 8.6 mg/dL (ref 8.4–10.5)
Chloride: 102 mEq/L (ref 96–112)
Creatinine, Ser: 2.02 mg/dL — ABNORMAL HIGH (ref 0.50–1.35)
GFR calc Af Amer: 40 mL/min — ABNORMAL LOW (ref >90)
GFR calc non Af Amer: 35 mL/min — ABNORMAL LOW (ref >90)
GLUCOSE: 142 mg/dL — AB (ref 70–99)
POTASSIUM: 3.5 meq/L — AB (ref 3.7–5.3)
SODIUM: 144 meq/L (ref 137–147)
TOTAL PROTEIN: 7.7 g/dL (ref 6.0–8.3)
Total Bilirubin: 0.4 mg/dL (ref 0.3–1.2)

## 2013-05-09 LAB — GLUCOSE, CAPILLARY
Glucose-Capillary: 133 mg/dL — ABNORMAL HIGH (ref 70–99)
Glucose-Capillary: 261 mg/dL — ABNORMAL HIGH (ref 70–99)
Glucose-Capillary: 69 mg/dL — ABNORMAL LOW (ref 70–99)
Glucose-Capillary: 106 mg/dL — ABNORMAL HIGH (ref 70–99)
Glucose-Capillary: 83 mg/dL (ref 70–99)

## 2013-05-09 LAB — DIGOXIN LEVEL: DIGOXIN LVL: 0.5 ng/mL — AB (ref 0.8–2.0)

## 2013-05-09 MED ORDER — ONDANSETRON HCL 4 MG/2ML IJ SOLN
4.0000 mg | Freq: Four times a day (QID) | INTRAMUSCULAR | Status: DC | PRN
  Administered 2013-05-10 – 2013-05-12 (×3): 4 mg via INTRAVENOUS
  Filled 2013-05-09 (×3): qty 2

## 2013-05-09 MED ORDER — HYDRALAZINE HCL 20 MG/ML IJ SOLN: 10.0000 mg | INTRAMUSCULAR | Status: DC | PRN

## 2013-05-09 MED ORDER — GABAPENTIN 100 MG PO CAPS
100.0000 mg | ORAL_CAPSULE | Freq: Three times a day (TID) | ORAL | Status: DC
  Administered 2013-05-09 – 2013-05-16 (×23): 100 mg via ORAL
  Filled 2013-05-09 (×28): qty 1

## 2013-05-09 MED ORDER — MAGNESIUM SULFATE 40 MG/ML IJ SOLN
2.0000 g | Freq: Once | INTRAMUSCULAR | Status: AC
  Administered 2013-05-09: 2 g via INTRAVENOUS
  Filled 2013-05-09: qty 50

## 2013-05-09 MED ORDER — POTASSIUM CHLORIDE CRYS ER 20 MEQ PO TBCR
40.0000 meq | EXTENDED_RELEASE_TABLET | ORAL | Status: AC
  Filled 2013-05-09: qty 2

## 2013-05-09 MED ORDER — FUROSEMIDE 40 MG PO TABS
40.0000 mg | ORAL_TABLET | Freq: Every day | ORAL | Status: DC
  Administered 2013-05-09: 40 mg via ORAL
  Filled 2013-05-09 (×2): qty 1

## 2013-05-09 NOTE — Progress Notes (Signed)
PT Cancellation Note  Patient Details Name: Rithwik Schmieg MRN: [id number] DOB: [birth year]   Cancelled Treatment:    Reason Eval/Treat Not Completed: Other (comment) (Refused secondary to fatigue).     INGOLD,Mckensey Berghuis 05/09/2013, 1:07 PM [facility] [phone] (pager)

## 2013-05-09 NOTE — Progress Notes (Signed)
Progress Note   Don Brown [id number]:[id number] DOB: 1955/07/10 DOA: 04/30/2013 PCP: Provider Not In System  Brief narrative: 58 y.o. male who presented on 04/30/2013 with a past medical history of Chronic Pancreatitis; Atrial fibrillation; CHF; Morbid obesity; Chronic pain; Narcotic dependence; Respiratory failure; History of renal failure; History of ventral hernia repair; Cervical compression fracture; Hypertension; Hyperlipidemia; Degenerative joint disease; and Heart murmur who c/o altered mental status. Per history obtained from family, he was drinking excess amounts of fluid and V8 and urinating a lot for several days prior to admission. He was found on the floor by his family in the bathroom. He was noted to have a fever of 102 and a glucose of 900 with lactic acidosis.   LINES / TUBES:  OETT 2/9 >>> 2/10; 2/10 >>> 2/13  Foley 2/8 >>>  R IJ TLC 2/9 >>> ???   CULTURES:  2/8 Blood >>> neg  2/9 MRSA >>> neg  2/11 Urine >>> neg  Subjective: Feeling slightly better, no significant SOB; complaining of abd pain (epigastric in origin); also with pain and numbness on his legs   Assessment/Plan:    Fever  -  Acute respiratory failure with hypoxia - source not confirmed but suspected to be pneumonia - ID has been following - ID recommended last dose vancomycin and zosyn on 2/16  -of antibiotics now -WBC's WNL and no fever -will monitor -OHS and OSA playing role in his SOB -continue weaning oxygen as tolerated      Atrial fibrillation - rate controlled - anticoag has not been intitiated due to chronic thrombocytopenia     CHF / Nonischemic cardiomyopathy - EF 20-25% with diffuse hypokinesis - Lasix (now PO), and digoxin; ACE discontinue due to acute renal failure -low sodium diet -daily weight and strict intake an doutput    Hyperosmolar non-ketotic state in patient with new diagnosis  type 2 diabetes mellitus - cont Lantus and sliding scale - Hemoglobin A1c 12.5 -will need insulin  at discharge -Diabetes educator and nutrition service consulted -CBGs better controlled past 24 hours after adjustment in insulin dose -patient more active and engage on injecting insulin by himself   Left lower extremity numbness and tingling -Suspect related to neuropathy in patient with newly diagnosed diabetes that is uncontrolled -continue blood sugar control -started on neurontin   acute renal failure -due to vanc and lisinopril most likely -diuresis Iv also contributing -lasix change to PO -vanc therapy completed -lisinopril on hold -follow BMET    Acute on Chronic pancreatitis -continue pancreatic enzymes -Patient continues with some nonspecific pain in the upper abdomen and Had hx pf previous pancreatic stent  -Lipase check and elevated 471 -diet change to CLD for bowel rest -lisinopril discontinue -vanc therapy already completed -follow lipase level -continue PRN antiemetics and pain meds  Hypokalemia -will replete and check Mg level  H/o MGUS- 2 yrs ago - hematology has evaluated - recommending bone marrow bx in future -Previously saw Shadad. Please review outpatient notes and advise for outpatient follow up.  Code Status: Full Family Communication: none Disposition Plan: home with HHPT   Consultants: ID  Antibiotics: Zosyn 2/9 >>>  Vancomycin 2/9 >>>   DVT prophylaxis: Lovenox - SCDs  Objective: Filed Weights   05/07/13 1311 05/08/13 0500 05/09/13 0645  Weight: 143 kg (315 lb 4.1 oz) 142.883 kg (315 lb) 143.291 kg (315 lb 14.4 oz)   Blood pressure 102/94, pulse 72, temperature 97.5 F (36.4 C), temperature source Oral, resp. rate 18, height 6' (1.829 m),  weight 143.291 kg (315 lb 14.4 oz), SpO2 95.00%.  Intake/Output Summary (Last 24 hours) at 05/09/13 1737 Last data filed at 05/09/13 4401  Gross per 24 hour  Intake    720 ml  Output    200 ml  Net    520 ml   Exam: General: No acute respiratory distress Lungs: Clear to auscultation  bilaterally without wheezes or crackles- 99% on 2 L Cardiovascular: Regular rate and rhythm without murmur gallop or rub normal S1 and S2 Abdomen: Tender to palpation over upper abdomen and epigastrium area, nondistended, soft, bowel sounds positive, no rebound, no ascites, no appreciable mass; positive hernias, which are soft and nontender and do not appear to be incarcerated. Large abdom scar w/o acute findings  Extremities: No significant cyanosis, clubbing, or edema bilateral lower extremities  Data Reviewed: Basic Metabolic Panel:  Recent Labs Lab 05/03/13 0057  05/03/13 1249 05/04/13 0430 05/04/13 1500 05/05/13 0400 05/06/13 0235 05/07/13 0252 05/08/13 0621 05/09/13 0418  NA  --   < >  --  147  --  145 147 144 141 144  K  --   < >  --  3.6*  --  3.6* 3.3* 3.2* 4.2 3.5*  CL  --   < >  --  111  --  105 102 100 98 102  CO2  --   < >  --  24  --  29 34* 33* 34* 29  GLUCOSE  --   < >  --  326*  --  340* 189* 125* 148* 142*  BUN  --   < >  --  19  --  25* _0 24*  CREATININE  --   < >  --  1.19  --  1.14 0.86 1.01 1.15 2.02*  CALCIUM  --   < >  --  8.2*  --  8.5 9.1 8.9 9.0 8.6  MG 1.7  --  1.8 1.6 2.1 1.8  --  1.9  --   --   PHOS 4.5  --  3.4 2.6 4.3 4.1  --   --   --   --   < > = values in this interval not displayed.  Liver Function Tests:  Recent Labs Lab 05/04/13 0430 05/09/13 0418  AST 9 17  ALT 8 11  ALKPHOS 53 52  BILITOT 0.6 0.4  PROT 7.1 7.7  ALBUMIN 2.4* 2.7*    Recent Labs Lab 05/09/13 0418  LIPASE 471*   No results found for this basename: AMMONIA,  in the last 168 hours  CBC:  Recent Labs Lab 05/03/13 0500 05/04/13 0430 05/05/13 0400 05/09/13 0418  WBC 4.7 3.9* 3.6* 4.3  HGB 12.2* 12.0* 12.9* 12.6*  HCT 38.4* 37.5* 41.2 39.0  MCV 87.3 87.8 89.0 88.4  PLT 86* 75* 75* 161   CBG:  Recent Labs Lab 05/08/13 1623 05/08/13 2144 05/09/13 0639 05/09/13 1101 05/09/13 1714  GLUCAP 176* 202* 133* 261* 69*    Recent Results (from the  past 240 hour(s))  CULTURE, BLOOD (ROUTINE X 2)     Status: None   Collection Time    04/30/13 11:37 PM      Result Value Ref Range Status   Specimen Description BLOOD RIGHT HAND   Final   Special Requests BOTTLES DRAWN AEROBIC ONLY [facility]   Final   Culture  Setup Time     Final   Value: 05/01/2013 08:20     Performed at [facility]  Culture     Final   Value: NO GROWTH 5 DAYS     Performed at [facility]   Report Status 05/07/2013 FINAL   Final  CULTURE, BLOOD (ROUTINE X 2)     Status: None   Collection Time    04/30/13 11:47 PM      Result Value Ref Range Status   Specimen Description BLOOD LEFT HAND   Final   Special Requests BOTTLES DRAWN AEROBIC AND ANAEROBIC [facility] EACH   Final   Culture  Setup Time     Final   Value: 05/01/2013 08:20     Performed at [facility]   Culture     Final   Value: NO GROWTH 5 DAYS     Performed at [facility]   Report Status 05/07/2013 FINAL   Final  MRSA PCR SCREENING     Status: None   Collection Time    05/01/13  6:16 AM      Result Value Ref Range Status   MRSA by PCR NEGATIVE  NEGATIVE Final   Comment:            The GeneXpert MRSA Assay (FDA     approved for NASAL specimens     only), is one component of a     comprehensive MRSA colonization     surveillance program. It is not     intended to diagnose MRSA     infection nor to guide or     monitor treatment for     MRSA infections.  URINE CULTURE     Status: None   Collection Time    05/03/13 11:40 AM      Result Value Ref Range Status   Specimen Description URINE, CATHETERIZED   Final   Special Requests NONE   Final   Culture  Setup Time     Final   Value: 05/03/2013 12:30     Performed at [facility]     Final   Value: NO GROWTH     Performed at [facility]   Culture     Final   Value: NO GROWTH     Performed at [facility]   Report Status 05/04/2013 FINAL   Final  RESPIRATORY VIRUS PANEL      Status: None   Collection Time    05/03/13  1:51 PM      Result Value Ref Range Status   Source - RVPAN TRACHEAL ASPIRATE   Final   Respiratory Syncytial Virus A NOT DETECTED   Final   Respiratory Syncytial Virus B NOT DETECTED   Final   Influenza A NOT DETECTED   Final   Influenza B NOT DETECTED   Final   Parainfluenza 1 NOT DETECTED   Final   Parainfluenza 2 NOT DETECTED   Final   Parainfluenza 3 NOT DETECTED   Final   Metapneumovirus NOT DETECTED   Final   Rhinovirus NOT DETECTED   Final   Adenovirus NOT DETECTED   Final   Influenza A H1 NOT DETECTED   Final   Influenza A H3 NOT DETECTED   Final   Comment: (NOTE)           Normal Reference Range for each Analyte: NOT DETECTED     Testing performed using the Luminex xTAG Respiratory Viral Panel test     kit.     This test was developed and its performance characteristics determined  by [facility]. It has not been cleared or approved by the [place]     Food and Drug Administration. This test is used for clinical purposes.     It should not be regarded as investigational or for research. This     laboratory is certified under the [place] (CLIA) as qualified to perform high complexity     clinical laboratory testing.     Performed at [facility] DIFFICILE BY PCR     Status: None   Collection Time    05/06/13  6:06 PM      Result Value Ref Range Status   C difficile by pcr NEGATIVE  NEGATIVE Final     Studies:  Recent x-ray studies have been reviewed in detail by the Attending Physician  Scheduled Meds:  Scheduled Meds: . antiseptic oral rinse  15 mL Mouth Rinse BID  . aspirin  325 mg Oral Daily  . digoxin  0.125 mg Oral Daily  . enoxaparin (LOVENOX) injection  40 mg Subcutaneous Q24H  . folic acid  1 mg Oral Daily  . gabapentin  100 mg Oral TID  . insulin aspart  0-15 Units Subcutaneous TID WC  . insulin glargine  60 Units Subcutaneous Daily    . lipase/protease/amylase  2 capsule Oral TID WC  . metoprolol tartrate  12.5 mg Oral BID  . multivitamin  5 mL Oral Daily  . pneumococcal 23 valent vaccine  0.5 mL Intramuscular Tomorrow-1000  . thiamine  100 mg Oral Daily    Time spent on care of this patient: 35 min   Zuhayr Deeney, ANP [phone]  05/09/2013, 5:37 PM   LOS: 9 days

## 2013-05-09 NOTE — Progress Notes (Signed)
NUTRITION FOLLOW UP  Intervention:    Diet advancement per MD  Provided carbohydrate counting diet education   Nutrition Dx:   Inadequate oral intake related to inability to eat as evidenced by NPO status. Ongoing, diet advanced to clear liquids  New Goal:   Pt to meet >/= 90% of their estimated nutrition needs   Monitor:   TF tolerance/adequacy, weight trend, labs, vent status.  Assessment:   58 yo M with known chronic pancreatitis, AFib, HF, obesity, HTN, HLD, coming in with acute agitation and fever, has HHS + elevated lactate. Concern for possible DT vs intraabdominal process (pancreatitis vs infection vs bowel ischemia).   Pt extubated 2/13 and diet advanced to heart healthy diet 2/15. Pt now on clear liquid diet again due to nausea and abdominal discomfort with pancreatitis. Encouraged pt to avoid high fat foods. Pt newly diagnosed with diabetes. Provided diabetes diet education. Pt is consuming 100% of meal trays at this time. No new weight. Labs noted.    Height: Ht Readings from Last 1 Encounters:  05/07/13 6' (1.829 m)    Weight Status:   Wt Readings from Last 1 Encounters:  05/09/13 315 lb 14.4 oz (143.291 kg)    Re-estimated needs:  Kcal: 2200-2500 Protein: 140-150 gm Fluid: 2.5 L daily  Skin: no wounds; non-pitting RLE and LLE edema  Diet Order: Clear Liquid   Intake/Output Summary (Last 24 hours) at 05/09/13 1055 Last data filed at 05/09/13 0826  Gross per 24 hour  Intake    960 ml  Output    200 ml  Net    760 ml    Last BM: 2/16   Labs:   Recent Labs Lab 05/04/13 0430 05/04/13 1500 05/05/13 0400  05/07/13 0252 05/08/13 0621 05/09/13 0418  NA 147  --  145  < > 144 141 144  K 3.6*  --  3.6*  < > 3.2* 4.2 3.5*  CL 111  --  105  < > 100 98 102  CO2 24  --  29  < > 33* 34* 29  BUN 19  --  25*  < > 14 16 24*  CREATININE 1.19  --  1.14  < > 1.01 1.15 2.02*  CALCIUM 8.2*  --  8.5  < > 8.9 9.0 8.6  MG 1.6 2.1 1.8  --  1.9  --   --    PHOS 2.6 4.3 4.1  --   --   --   --   GLUCOSE 326*  --  340*  < > 125* 148* 142*  < > = values in this interval not displayed.  CBG (last 3)   Recent Labs  05/08/13 1623 05/08/13 2144 05/09/13 0639  GLUCAP 176* 202* 133*    Scheduled Meds: . antiseptic oral rinse  15 mL Mouth Rinse BID  . aspirin  325 mg Oral Daily  . digoxin  0.125 mg Oral Daily  . enoxaparin (LOVENOX) injection  40 mg Subcutaneous Q24H  . folic acid  1 mg Oral Daily  . gabapentin  100 mg Oral TID  . insulin aspart  0-15 Units Subcutaneous TID WC  . insulin glargine  60 Units Subcutaneous Daily  . lipase/protease/amylase  2 capsule Oral TID WC  . lisinopril  10 mg Oral Daily  . metoprolol tartrate  12.5 mg Oral BID  . multivitamin  5 mL Oral Daily  . pneumococcal 23 valent vaccine  0.5 mL Intramuscular Tomorrow-1000  . thiamine  100 mg Oral  Daily    Continuous Infusions:   Pryor Ochoa RD, LDN Inpatient Clinical Dietitian Pager: [phone] After Hours Pager: [phone]

## 2013-05-09 NOTE — Progress Notes (Signed)
Hypoglycemic Event  CBG: 69  Treatment: 15 GM carbohydrate snack  Symptoms: Sweaty and Hungry  Follow-up CBG: Time:1741 CBG Result:106  Possible Reasons for Event: Other: pt on clear liquid diet  Comments/MD notified:pt stated he felt better after snack, vss, pt stable    Don Brown E  Remember to initiate Hypoglycemia Order Set & complete

## 2013-05-09 NOTE — Progress Notes (Signed)
  RD consulted for nutrition education regarding diabetes.   Lab Results  Component Value Date   HGBA1C 12.5* 05/01/2013    RD provided "Carbohydrate Counting for People with Diabetes" handout from the Academy of Nutrition and Dietetics. Discussed different food groups and their effects on blood sugar, emphasizing carbohydrate-containing foods. Provided list of carbohydrates and recommended serving sizes of common foods.  Pt states he typically only eats one meal and one snack daily. Discussed importance of controlled and consistent carbohydrate intake throughout the day. Provided examples of ways to balance meals/snacks and encouraged intake of high-fiber, whole grain complex carbohydrates. Teach back method used. Pt reports drinking a lot of gingerale and juice PTA which he now knows to avoid. Additionally, provided "5-day 1800-calorie sample menus" from the Academy of Nutrition and Dietetics. Encouraged pt to eat all food groups daily.   Expect good compliance.  Body mass index is 42.83 kg/(m^2). Pt meets criteria for Obesity based on current BMI.  Current diet order is Clear Liquids, patient is consuming approximately 100% of meals at this time.  RD contact information provided. RD to continue to monitor.  Pryor Ochoa RD, LDN Inpatient Clinical Dietitian Pager: [phone] After Hours Pager: [phone]

## 2013-05-09 NOTE — Progress Notes (Addendum)
Patient called to the front desk to ask to speak with his nurse. When I entered patient room he asked about a linen change and also showed me his right forearm. Patient's right forearm had redness with what appeared to be three small hives at the IV site. I asked the pt.if he was having any itching at the site and he said "yes" at first. I asked if he was having itching anywhere else and he said "no". At the time patient had IVPB vancomycin running at a rate of 250 mL/hr. IV vancomycin was stopped, IV was flushed with normal saline, and VS were taken. Asked pt.if he was still experiencing any itching and he said " no, it stopped".Paged Mid-level on call with [facility] to notify. Was told to reduce the rate to 240ml/hr and continue to run. Pt.is currently receiving vancomycin and has had no complaints of itching and no sign of distress since restarting infusion. Will continue to monitor.

## 2013-05-09 NOTE — Progress Notes (Signed)
Pt a/o, c/o pain PRN Vicodin given as ordered, pt administered insulin today, pt on clear liquid diet tolerating well, vss, pt stable

## 2013-05-09 NOTE — Progress Notes (Signed)
PT Cancellation Note  Patient Details Name: Don Brown MRN: [id number] DOB: 05-14-1955   Cancelled Treatment:    Reason Eval/Treat Not Completed: Other (comment) (could not wake pt up enough to ambulate.  )   INGOLD,[place] 05/09/2013, 3:03 PM  [facility] [phone] (pager)

## 2013-05-10 DIAGNOSIS — R509 Fever, unspecified: Secondary | ICD-10-CM

## 2013-05-10 DIAGNOSIS — K859 Acute pancreatitis without necrosis or infection, unspecified: Secondary | ICD-10-CM

## 2013-05-10 LAB — COMPREHENSIVE METABOLIC PANEL
ALK PHOS: 46 U/L (ref 39–117)
ALT: 11 U/L (ref 0–53)
AST: 20 U/L (ref 0–37)
Albumin: 2.6 g/dL — ABNORMAL LOW (ref 3.5–5.2)
BILIRUBIN TOTAL: 0.3 mg/dL (ref 0.3–1.2)
BUN: 25 mg/dL — ABNORMAL HIGH (ref 6–23)
CHLORIDE: 102 meq/L (ref 96–112)
CO2: 29 meq/L (ref 19–32)
Calcium: 8.4 mg/dL (ref 8.4–10.5)
Creatinine, Ser: 2.96 mg/dL — ABNORMAL HIGH (ref 0.50–1.35)
GFR calc non Af Amer: 22 mL/min — ABNORMAL LOW (ref >90)
GFR, EST AFRICAN AMERICAN: 25 mL/min — AB (ref >90)
GLUCOSE: 108 mg/dL — AB (ref 70–99)
POTASSIUM: 3.4 meq/L — AB (ref 3.7–5.3)
SODIUM: 142 meq/L (ref 137–147)
Total Protein: 7.5 g/dL (ref 6.0–8.3)

## 2013-05-10 LAB — GLUCOSE, CAPILLARY
GLUCOSE-CAPILLARY: 53 mg/dL — AB (ref 70–99)
GLUCOSE-CAPILLARY: 159 mg/dL — AB (ref 70–99)
Glucose-Capillary: 110 mg/dL — ABNORMAL HIGH (ref 70–99)
Glucose-Capillary: 96 mg/dL (ref 70–99)
Glucose-Capillary: 145 mg/dL — ABNORMAL HIGH (ref 70–99)
Glucose-Capillary: 86 mg/dL (ref 70–99)
Glucose-Capillary: 65 mg/dL — ABNORMAL LOW (ref 70–99)
Glucose-Capillary: 134 mg/dL — ABNORMAL HIGH (ref 70–99)
Glucose-Capillary: 42 mg/dL — CL (ref 70–99)
Glucose-Capillary: 43 mg/dL — CL (ref 70–99)

## 2013-05-10 LAB — SODIUM, URINE, RANDOM: SODIUM UR: 32 meq/L

## 2013-05-10 LAB — LIPASE, BLOOD: LIPASE: 39 U/L (ref 11–59)

## 2013-05-10 LAB — DIGOXIN LEVEL: Digoxin Level: 0.6 ng/mL — ABNORMAL LOW (ref 0.8–2.0)

## 2013-05-10 LAB — CREATININE, URINE, RANDOM: CREATININE, URINE: 101.78 mg/dL

## 2013-05-10 MED ORDER — HYDROMORPHONE HCL PF 1 MG/ML IJ SOLN
1.0000 mg | INTRAMUSCULAR | Status: DC | PRN
  Administered 2013-05-10 – 2013-05-11 (×4): 1 mg via INTRAVENOUS
  Filled 2013-05-10 (×4): qty 1

## 2013-05-10 MED ORDER — DEXTROSE 50 % IV SOLN
INTRAVENOUS | Status: AC
  Administered 2013-05-10: 25 g
  Filled 2013-05-10: qty 50

## 2013-05-10 MED ORDER — INSULIN GLARGINE 100 UNIT/ML ~~LOC~~ SOLN
20.0000 [IU] | Freq: Every day | SUBCUTANEOUS | Status: DC
  Administered 2013-05-11 – 2013-05-13 (×3): 20 [IU] via SUBCUTANEOUS
  Filled 2013-05-10 (×3): qty 0.2

## 2013-05-10 MED ORDER — SODIUM CHLORIDE 0.9 % IV SOLN
INTRAVENOUS | Status: AC
  Administered 2013-05-10 (×2): via INTRAVENOUS
  Filled 2013-05-10: qty 1000

## 2013-05-10 MED ORDER — DEXTROSE 50 % IV SOLN
INTRAVENOUS | Status: AC
  Administered 2013-05-10: 50 mL
  Filled 2013-05-10: qty 50

## 2013-05-10 MED ORDER — SODIUM CHLORIDE 0.9 % IV SOLN
INTRAVENOUS | Status: DC
  Administered 2013-05-10: 09:00:00 via INTRAVENOUS

## 2013-05-10 NOTE — Progress Notes (Signed)
PT Cancellation Note  Patient Details Name: Don Brown MRN: [id number] DOB: 03/08/1956   Cancelled Treatment:    Reason Eval/Treat Not Completed: Pain limiting ability to participate (pt reports he is too dizzy, has abdominal pain, is waiting for pain med and doesn't feel that he can walk at this time)   Norwood Levo 05/10/2013, 1:37 PM

## 2013-05-10 NOTE — Progress Notes (Signed)
Physical Therapy Treatment Patient Details Name: Rigoberto Repass MRN: [id number] DOB: 1955-07-10 Today's Date: 05/10/2013 Time: 7106-2694 PT Time Calculation (min): 10 min  PT Assessment / Plan / Recommendation  History of Present Illness pt continues with multiple complaints, but  agreed to try standing and doing some LE exercise even after he had decided to cancel.  He was found to have O2 sats at 86 % even though  he had taken his oxygen off and was holding it in his hand saying he didn't need it.  O2 applied and pt became much more agreeable.  Standing BP 110/74   PT Comments   Pt decided he wanted to do some activity as I was preparing to leave the room after he had refused PT Pt was limited by dizziness and leg weakness, but was able to stand for a few minutes. He will need supplemental O2 tank and wide RW for ambulation attempt next visit  Follow Up Recommendations        Does the patient have the potential to tolerate intense rehabilitation     Barriers to Discharge        Equipment Recommendations       Recommendations for Other Services    Frequency     Progress towards PT Goals Progress towards PT goals: Not progressing toward goals - comment (limited by dizziness and leg weakness)  Plan Current plan remains appropriate    Precautions / Restrictions Precautions Precautions: Fall   Pertinent Vitals/Pain O2 sats 86% on RA    Mobility  Bed Mobility General bed mobility comments: Pt sitting up in chair Transfers Overall transfer level: Needs assistance Equipment used: None Transfers: Sit to/from Stand Sit to Stand: Min guard;Supervision General transfer comment: Min/guard for safety and mild steadying with cues for hand placement and safety with controlled descent.   Ambulation/Gait General Gait Details: did not progress gait due to patient stating that his legs were weak and that he was sure he would fall    Exercises General Exercises - Lower Extremity Ankle  Circles/Pumps: AROM;Both;5 reps Long Arc Quad: AROM;Both;5 reps   PT Diagnosis:    PT Problem List:   PT Treatment Interventions:     PT Goals (current goals can now be found in the care plan section)    Visit Information  Last PT Received On: 05/10/13 Assistance Needed: +1 Reason Eval/Treat Not Completed: Pain limiting ability to participate (pt reports he is too dizzy, has abdominal pain, is waiting for pain med and doesn't feel that he can walk at this time) History of Present Illness: pt continues with multiple complaints, but  agreed to try standing and doing some LE exercise even after he had decided to cancel.  He was found to have O2 sats at 86 % even though  he had taken his oxygen off and was holding it in his hand saying he didn't need it.  O2 applied and pt became much more agreeable.  Standing BP 110/74    Subjective Data      Cognition  Cognition Arousal/Alertness: Awake/alert Behavior During Therapy: WFL for tasks assessed/performed Overall Cognitive Status: Within Functional Limits for tasks assessed    Balance  Balance Overall balance assessment: Independent Sitting-balance support: No upper extremity supported Sitting balance-Leahy Scale: Good Sitting balance - Comments: pt able to sit on with back unsupported independently  c/o abdominal pain from his "hernias" Standing balance support: No upper extremity supported (pt able to stand for several minutes usupported and move his arms  around to challenge balance) Standing balance-Leahy Scale: Fair  End of Session PT - End of Session Activity Tolerance: Treatment limited secondary to medical complications (Comment) (dizziness) Patient left: in chair;with call bell/phone within reach   GP    Helene Kelp K. [place], [place] 05/10/2013, 3:46 PM

## 2013-05-10 NOTE — Progress Notes (Signed)
Hypoglycemic Event  CBG: 53  Treatment: 15 GM carbohydrate snack  Symptoms: Hungry  Follow-up CBG: Time:0300 CBG Result:110  Possible Reasons for Event: Inadequate meal intake  Comments/MD notified:no    Don Brown, Michelene Gardener  Remember to initiate Hypoglycemia Order Set & complete

## 2013-05-10 NOTE — Progress Notes (Signed)
Inpatient Diabetes Program Recommendations  AACE/ADA: New Consensus Statement on Inpatient Glycemic Control  Target Ranges:  Prepandial:   less than 140 mg/dL      Peak postprandial:   less than 180 mg/dL (1-2 hours)      Critically ill patients:  140 - 180 mg/dL  Pager:  [phone] Hours:  8 am-10pm   Reason for Visit: Hypoglycemia this am:  Results for Don Brown, Don Brown (MRN [id number]) as of 05/10/2013 10:29  Ref. Range 05/10/2013 02:31 05/10/2013 03:08 05/10/2013 06:26  Glucose-Capillary Latest Range: 70-99 mg/dL 53 (L) 110 (H) 96     Inpatient Diabetes Program Recommendations  Insulin - Basal: Decrease Lantus to 50 units qhs   Courtney Heys PhD, RN, BC-ADM Diabetes Coordinator  Office:  [phone] Team Pager:  [phone]

## 2013-05-10 NOTE — Progress Notes (Signed)
Pt c/o tingling sensation of left leg + sensation warm and leg mobile.. Repositioned pt on bed and assisted to sit up on edge of bed obtained relief. Continued to monitor pt

## 2013-05-10 NOTE — Progress Notes (Signed)
Hypoglycemic Event  CBG: 65  Treatment: D50 IV 50 mL  Symptoms: None  Follow-up CBG: Time:1703 CBG Result:134  Possible Reasons for Event: Other: npo  Comments/MD notified:yes/ d/c patients Lantus    Don Brown, Don Brown  Remember to initiate Hypoglycemia Order Set & complete

## 2013-05-10 NOTE — Progress Notes (Signed)
Endorsed to incoming RN to follow up on pt's complain on left leg re tingling sensation.

## 2013-05-10 NOTE — Progress Notes (Signed)
[facility] PROGRESS NOTE Interim History: 58 y.o. Brown who presented on 04/30/2013 with a past medical history of Chronic Pancreatitis; Atrial fibrillation; CHF; Morbid obesity; Chronic pain; Narcotic dependence; Respiratory failure; History of renal failure; History of ventral hernia repair; Cervical compression fracture; Hypertension; Hyperlipidemia; Degenerative joint disease; and Heart murmur who c/o altered mental status. Per history obtained from family, he was drinking excess amounts of fluid and V8 and urinating a lot for several days prior to admission. He was found on the floor by his family in the bathroom. He was noted to have a fever of 102 and a glucose of 900 with lactic acidosis  Assessment/Plan: Fever - Acute respiratory failure with hypoxia  - Source not confirmed but suspected to be pneumonia - ID has been following  - ID recommended last dose vancomycin and zosyn on 2/16  - Off antibiotics now  - WBC's WNL and no fever  - continue weaning oxygen as tolerated   AKI: - due to vanc and ACE-I - start IV fluids check U sodium and cr. - hold lasix.  Atrial fibrillation  - Rate controlled  - anticoag has not been intitiated due to chronic thrombocytopenia.   CHF / Nonischemic cardiomyopathy  - EF 20-25% with diffuse hypokinesis  - Hold lasix and digoxin; ACE discontinue due to acute renal failure  - Low sodium diet  - Daily weight and strict intake and output. - Check dig level.   Hyperosmolar non-ketotic state in patient with new diagnosis type 2 diabetes mellitus  - cont Lantus and sliding scale  - Hemoglobin A1c 12.5  - Will need insulin at discharge  - Diabetes educator and nutrition service consulted  - CBGs better controlled past 24 hours after adjustment in insulin dose  - Patient more active and engage on injecting insulin by himself.  Left lower extremity numbness and tingling  - Suspect related to neuropathy in patient with newly diagnosed diabetes  that is uncontrolled  - Continue blood sugar control  - cont neurontin   Acute on Chronic pancreatitis  - Continue pancreatic enzymes  - Patient continues with some nonspecific pain in the upper abdomen. - Diet change to CLD for bowel rest  - Llisinopril discontinue  - Continue PRN antiemetics and pain meds   Hypokalemia  - Will replete and check Mg level   H/o MGUS- 2 yrs ago  - hematology has evaluated - recommending bone marrow bx in future  - Previously saw [facility]. Please review outpatient notes and advise for outpatient follow up.   Code Status: full Family Communication: wife  Disposition Plan: inpatient   Consultants:  none  Procedures:  Ct abd  Echo  abd x-ray  Antibiotics:  vanc ans zosyn  HPI/Subjective: Abdominal pain, nausea no vomiting. Anorexic.  Objective: Filed Vitals:   05/09/13 1720 05/09/13 2039 05/10/13 0500 05/10/13 0614  BP: 102/94 133/83  103/71  Pulse: 72 75  63  Temp:  98.3 F (36.8 C)  98.3 F (36.8 C)  TempSrc:  Oral  Oral  Resp: _0 Height:      Weight:   144.4 kg (318 lb 5.5 oz)   SpO2: 95% 100%  97%    Intake/Output Summary (Last 24 hours) at 05/10/13 1303 Last data filed at 05/10/13 1020  Gross per 24 hour  Intake    720 ml  Output   1375 ml  Net   -655 ml   Filed Weights   05/08/13 0500 05/09/13 0645 05/10/13  0500  Weight: 142.883 kg (315 lb) 143.291 kg (315 lb 14.4 oz) 144.4 kg (318 lb 5.5 oz)    Exam:  General: Alert, awake, oriented x3, in no acute distress.  HEENT: No bruits, no goiter.  Heart: Regular rate and rhythm, without murmurs, rubs, gallops.  Lungs: Good air movement, clear to auscultation Abdomen: Soft, epigastric and left quadrant tenderness, nondistended, positive bowel sounds.  Neuro: Grossly intact, nonfocal.   Data Reviewed: Basic Metabolic Panel:  Recent Labs Lab 05/04/13 0430 05/04/13 1500 05/05/13 0400 05/06/13 0235 05/07/13 0252 05/08/13 0621 05/09/13 0418  05/10/13 0324  NA 147  --  145 147 144 141 144 142  K 3.6*  --  3.6* 3.3* 3.2* 4.2 3.5* 3.4*  CL 111  --  105 102 100 98 102 102  CO2 24  --  29 34* 33* 34* 29 29  GLUCOSE 326*  --  340* 189* 125* 148* 142* 108*  BUN 19  --  25* _0 24* 25*  CREATININE 1.19  --  1.14 0.86 1.01 1.15 2.02* 2.96*  CALCIUM 8.2*  --  8.5 9.1 8.9 9.0 8.6 8.4  MG 1.6 2.1 1.8  --  1.9  --   --   --   PHOS 2.6 4.3 4.1  --   --   --   --   --    Liver Function Tests:  Recent Labs Lab 05/04/13 0430 05/09/13 0418 05/10/13 0324  AST _1 ALT _2 ALKPHOS 53 52 46  BILITOT 0.6 0.4 0.3  PROT 7.1 7.7 7.5  ALBUMIN 2.4* 2.7* 2.6*    Recent Labs Lab 05/09/13 0418 05/10/13 0324  LIPASE 471* 39   No results found for this basename: AMMONIA,  in the last 168 hours CBC:  Recent Labs Lab 05/04/13 0430 05/05/13 0400 05/09/13 0418  WBC 3.9* 3.6* 4.3  HGB 12.0* 12.9* 12.6*  HCT 37.5* 41.2 39.0  MCV 87.8 89.0 88.4  PLT 75* 75* 161   Cardiac Enzymes: No results found for this basename: CKTOTAL, CKMB, CKMBINDEX, TROPONINI,  in the last 168 hours BNP (last 3 results) No results found for this basename: PROBNP,  in the last 8760 hours CBG:  Recent Labs Lab 05/10/13 0231 05/10/13 0308 05/10/13 0626 05/10/13 0833 05/10/13 1153  GLUCAP 53* 110* 96 145* 86    Recent Results (from the past 240 hour(s))  CULTURE, BLOOD (ROUTINE X 2)     Status: None   Collection Time    04/30/13 11:37 PM      Result Value Ref Range Status   Specimen Description BLOOD RIGHT HAND   Final   Special Requests BOTTLES DRAWN AEROBIC ONLY [facility]   Final   Culture  Setup Time     Final   Value: 05/01/2013 08:20     Performed at [facility]   Culture     Final   Value: NO GROWTH 5 DAYS     Performed at [facility]   Report Status 05/07/2013 FINAL   Final  CULTURE, BLOOD (ROUTINE X 2)     Status: None   Collection Time    04/30/13 11:47 PM      Result Value Ref Range Status   Specimen  Description BLOOD LEFT HAND   Final   Special Requests BOTTLES DRAWN AEROBIC AND ANAEROBIC [facility] 2 EACH   Final   Culture  Setup Time     Final   Value: 05/01/2013 08:20  Performed at [facility]     Final   Value: NO GROWTH 5 DAYS     Performed at [facility]   Report Status 05/07/2013 FINAL   Final  MRSA PCR SCREENING     Status: None   Collection Time    05/01/13  6:16 AM      Result Value Ref Range Status   MRSA by PCR NEGATIVE  NEGATIVE Final   Comment:            The GeneXpert MRSA Assay (FDA     approved for NASAL specimens     only), is one component of a     comprehensive MRSA colonization     surveillance program. It is not     intended to diagnose MRSA     infection nor to guide or     monitor treatment for     MRSA infections.  URINE CULTURE     Status: None   Collection Time    05/03/13 11:40 AM      Result Value Ref Range Status   Specimen Description URINE, CATHETERIZED   Final   Special Requests NONE   Final   Culture  Setup Time     Final   Value: 05/03/2013 12:30     Performed at [facility]     Final   Value: NO GROWTH     Performed at [facility]   Culture     Final   Value: NO GROWTH     Performed at [facility]   Report Status 05/04/2013 FINAL   Final  RESPIRATORY VIRUS PANEL     Status: None   Collection Time    05/03/13  1:51 PM      Result Value Ref Range Status   Source - RVPAN TRACHEAL ASPIRATE   Final   Respiratory Syncytial Virus A NOT DETECTED   Final   Respiratory Syncytial Virus B NOT DETECTED   Final   Influenza A NOT DETECTED   Final   Influenza B NOT DETECTED   Final   Parainfluenza 1 NOT DETECTED   Final   Parainfluenza 2 NOT DETECTED   Final   Parainfluenza 3 NOT DETECTED   Final   Metapneumovirus NOT DETECTED   Final   Rhinovirus NOT DETECTED   Final   Adenovirus NOT DETECTED   Final   Influenza A H1 NOT DETECTED   Final   Influenza A H3 NOT DETECTED    Final   Comment: (NOTE)           Normal Reference Range for each Analyte: NOT DETECTED     Testing performed using the Luminex xTAG Respiratory Viral Panel test     kit.     This test was developed and its performance characteristics determined     by [facility]. It has not been cleared or approved by the [place]     Food and Drug Administration. This test is used for clinical purposes.     It should not be regarded as investigational or for research. This     laboratory is certified under the [place] (CLIA) as qualified to perform high complexity     clinical laboratory testing.     Performed at [facility] DIFFICILE BY PCR     Status: None   Collection Time  05/06/13  6:06 PM      Result Value Ref Range Status   C difficile by pcr NEGATIVE  NEGATIVE Final     Studies: No results found.  Scheduled Meds: . antiseptic oral rinse  15 mL Mouth Rinse BID  . aspirin  325 mg Oral Daily  . digoxin  0.125 mg Oral Daily  . enoxaparin (LOVENOX) injection  40 mg Subcutaneous Q24H  . folic acid  1 mg Oral Daily  . gabapentin  100 mg Oral TID  . insulin aspart  0-15 Units Subcutaneous TID WC  . insulin glargine  60 Units Subcutaneous Daily  . lipase/protease/amylase  2 capsule Oral TID WC  . metoprolol tartrate  12.5 mg Oral BID  . multivitamin  5 mL Oral Daily  . pneumococcal 23 valent vaccine  0.5 mL Intramuscular Tomorrow-1000  . thiamine  100 mg Oral Daily   Continuous Infusions: . sodium chloride       Charlynne Cousins  [facility] Pager [phone]. If 8PM-8AM, please contact night-coverage at [email or web address], password [facility] 05/10/2013, 1:03 PM  LOS: 10 days

## 2013-05-11 LAB — GLUCOSE, CAPILLARY
GLUCOSE-CAPILLARY: 107 mg/dL — AB (ref 70–99)
GLUCOSE-CAPILLARY: 112 mg/dL — AB (ref 70–99)
GLUCOSE-CAPILLARY: 114 mg/dL — AB (ref 70–99)
GLUCOSE-CAPILLARY: 123 mg/dL — AB (ref 70–99)
GLUCOSE-CAPILLARY: 134 mg/dL — AB (ref 70–99)
GLUCOSE-CAPILLARY: 62 mg/dL — AB (ref 70–99)
Glucose-Capillary: 44 mg/dL — CL (ref 70–99)
Glucose-Capillary: 98 mg/dL (ref 70–99)
Glucose-Capillary: 118 mg/dL — ABNORMAL HIGH (ref 70–99)
Glucose-Capillary: 72 mg/dL (ref 70–99)
Glucose-Capillary: 144 mg/dL — ABNORMAL HIGH (ref 70–99)
Glucose-Capillary: 135 mg/dL — ABNORMAL HIGH (ref 70–99)

## 2013-05-11 LAB — BASIC METABOLIC PANEL
BUN: 22 mg/dL (ref 6–23)
CALCIUM: 7.9 mg/dL — AB (ref 8.4–10.5)
CO2: 28 meq/L (ref 19–32)
CREATININE: 3.24 mg/dL — AB (ref 0.50–1.35)
Chloride: 105 mEq/L (ref 96–112)
GFR calc Af Amer: 23 mL/min — ABNORMAL LOW (ref >90)
GFR calc non Af Amer: 20 mL/min — ABNORMAL LOW (ref >90)
Glucose, Bld: 117 mg/dL — ABNORMAL HIGH (ref 70–99)
Potassium: 4.2 mEq/L (ref 3.7–5.3)
Sodium: 144 mEq/L (ref 137–147)

## 2013-05-11 MED ORDER — HYDROMORPHONE HCL PF 1 MG/ML IJ SOLN: 0.5000 mg | INTRAMUSCULAR | Status: DC | PRN

## 2013-05-11 MED ORDER — HYDROCODONE-ACETAMINOPHEN 5-325 MG PO TABS
1.0000 | ORAL_TABLET | Freq: Four times a day (QID) | ORAL | Status: DC | PRN
  Administered 2013-05-11 (×2): 1 via ORAL
  Filled 2013-05-11 (×2): qty 1

## 2013-05-11 MED ORDER — OXYCODONE HCL 5 MG PO TABS
10.0000 mg | ORAL_TABLET | Freq: Four times a day (QID) | ORAL | Status: DC | PRN
  Administered 2013-05-11 – 2013-05-13 (×5): 10 mg via ORAL
  Filled 2013-05-11 (×5): qty 2

## 2013-05-11 NOTE — Progress Notes (Signed)
Pt had CBG of 42, MD put pt back on full liquid diet, gave 2 OJ's, checked CBG 43, MD ordered D50 25 g; gave dose rechecked CBG 159, will continue to monitor, Arvella Nigh RN

## 2013-05-11 NOTE — Progress Notes (Signed)
Hypoglycemic Event  CBG: 62  Treatment: 1 cup orange juice  Symptoms: none  Follow-up CBG: Time:0429 CBG Result:114  Possible Reasons for Event: insulin during the day  Comments/MD notified: protocol followed    Don Brown  Remember to initiate Hypoglycemia Order Set & complete

## 2013-05-11 NOTE — Progress Notes (Signed)
[facility] PROGRESS NOTE Interim History: 58 y.o. male who presented on 04/30/2013 with a past medical history of Chronic Pancreatitis; Atrial fibrillation; CHF; Morbid obesity; Chronic pain; Narcotic dependence; Respiratory failure; History of renal failure; History of ventral hernia repair; Cervical compression fracture; Hypertension; Hyperlipidemia; Degenerative joint disease; and Heart murmur who c/o altered mental status. Per history obtained from family, he was drinking excess amounts of fluid and V8 and urinating a lot for several days prior to admission. He was found on the floor by his family in the bathroom. He was noted to have a fever of 102 and a glucose of 900 with lactic acidosis  Assessment/Plan: Fever - Acute respiratory failure with hypoxia  - Source not confirmed but suspected to be pneumonia - ID has been following  - ID recommended last dose vancomycin and zosyn on 2/16  - Off antibiotics now  - WBC's WNL and no fever   Acute on Chronic pancreatitis  - Continue pancreatic enzymes  - tolerate diet, change narcotics to orals. - Llisinopril discontinue  - Continue PRN antiemetics and pain meds orally.  AKI: - due to vanc and ACE-I - start IV fluids Fena <1% - held lasix.  Atrial fibrillation  - Rate controlled  - anticoag has not been intitiated due to chronic thrombocytopenia.   CHF / Nonischemic cardiomyopathy  - EF 20-25% with diffuse hypokinesis  - Hold lasix and digoxin; ACE discontinue due to acute renal failure  - Low sodium diet  - Daily weight and strict intake and output. - Check dig level.  Hyperosmolar non-ketotic state in patient with new diagnosis type 2 diabetes mellitus  - cont Lantus and sliding scale  - Hemoglobin A1c 12.5  - Will need insulin at discharge  - Diabetes educator and nutrition service consulted  - CBGs better controlled past 24 hours after adjustment in insulin dose  - Patient more active and engage on injecting insulin  by himself.  Left lower extremity numbness and tingling  - Suspect related to neuropathy in patient with newly diagnosed diabetes that is uncontrolled  - Continue blood sugar control  - cont neurontin   Hypokalemia  - Will replete and check Mg level   H/o MGUS- 2 yrs ago  - hematology has evaluated - recommending bone marrow bx in future  - Previously saw Shadad. Please review outpatient notes and advise for outpatient follow up.   Code Status: full Family Communication: wife  Disposition Plan: inpatient   Consultants:  none  Procedures:  Ct abd  Echo  abd x-ray  Antibiotics:  vanc ans zosyn  HPI/Subjective: Abdominal pain, nausea no vomiting. Anorexic.  Objective: Filed Vitals:   05/10/13 1417 05/10/13 1526 05/10/13 1958 05/11/13 0629  BP: 86/45 109/73 104/60 105/61  Pulse: 84 95 83 83  Temp: 97.9 F (36.6 C)  97.7 F (36.5 C) 98.2 F (36.8 C)  TempSrc: Oral  Oral Oral  Resp: '20 20 20 20  ' Height:      Weight:    145.242 kg (320 lb 3.2 oz)  SpO2: 98% 96% 97% 96%    Intake/Output Summary (Last 24 hours) at 05/11/13 1055 Last data filed at 05/11/13 0846  Gross per 24 hour  Intake 1876.25 ml  Output   1350 ml  Net 526.25 ml   Filed Weights   05/09/13 0645 05/10/13 0500 05/11/13 0629  Weight: 143.291 kg (315 lb 14.4 oz) 144.4 kg (318 lb 5.5 oz) 145.242 kg (320 lb 3.2 oz)    Exam:  General: Alert, awake, oriented x3, in no acute distress.  HEENT: No bruits, no goiter.  Heart: Regular rate and rhythm, without murmurs, rubs, gallops.  Lungs: Good air movement, clear to auscultation Abdomen: Soft, epigastric and left quadrant tenderness, nondistended, positive bowel sounds.  Neuro: Grossly intact, nonfocal.   Data Reviewed: Basic Metabolic Panel:  Recent Labs Lab 05/04/13 1500  05/05/13 0400 05/06/13 0235 05/07/13 0252 05/08/13 0621 05/09/13 0418 05/10/13 0324  NA  --   < > 145 147 144 141 144 142  K  --   < > 3.6* 3.3* 3.2* 4.2 3.5*  3.4*  CL  --   < > 105 102 100 98 102 102  CO2  --   < > 29 34* 33* 34* 29 29  GLUCOSE  --   < > 340* 189* 125* 148* 142* 108*  BUN  --   < > 25* '15 14 16 ' 24* 25*  CREATININE  --   < > 1.14 0.86 1.01 1.15 2.02* 2.96*  CALCIUM  --   < > 8.5 9.1 8.9 9.0 8.6 8.4  MG 2.1  --  1.8  --  1.9  --   --   --   PHOS 4.3  --  4.1  --   --   --   --   --   < > = values in this interval not displayed. Liver Function Tests:  Recent Labs Lab 05/09/13 0418 05/10/13 0324  AST 17 20  ALT 11 11  ALKPHOS 52 46  BILITOT 0.4 0.3  PROT 7.7 7.5  ALBUMIN 2.7* 2.6*    Recent Labs Lab 05/09/13 0418 05/10/13 0324  LIPASE 471* 39   No results found for this basename: AMMONIA,  in the last 168 hours CBC:  Recent Labs Lab 05/05/13 0400 05/09/13 0418  WBC 3.6* 4.3  HGB 12.9* 12.6*  HCT 41.2 39.0  MCV 89.0 88.4  PLT 75* 161   Cardiac Enzymes: No results found for this basename: CKTOTAL, CKMB, CKMBINDEX, TROPONINI,  in the last 168 hours BNP (last 3 results) No results found for this basename: PROBNP,  in the last 8760 hours CBG:  Recent Labs Lab 05/11/13 0259 05/11/13 0350 05/11/13 0429 05/11/13 0635 05/11/13 0855  GLUCAP 44* 62* 114* 98 118*    Recent Results (from the past 240 hour(s))  URINE CULTURE     Status: None   Collection Time    05/03/13 11:40 AM      Result Value Ref Range Status   Specimen Description URINE, CATHETERIZED   Final   Special Requests NONE   Final   Culture  Setup Time     Final   Value: 05/03/2013 12:30     Performed at [place]     Final   Value: NO GROWTH     Performed at [facility]   Culture     Final   Value: NO GROWTH     Performed at [facility]   Report Status 05/04/2013 FINAL   Final  RESPIRATORY VIRUS PANEL     Status: None   Collection Time    05/03/13  1:51 PM      Result Value Ref Range Status   Source - RVPAN TRACHEAL ASPIRATE   Final   Respiratory Syncytial Virus A NOT DETECTED    Final   Respiratory Syncytial Virus B NOT DETECTED   Final   Influenza A NOT DETECTED   Final  Influenza B NOT DETECTED   Final   Parainfluenza 1 NOT DETECTED   Final   Parainfluenza 2 NOT DETECTED   Final   Parainfluenza 3 NOT DETECTED   Final   Metapneumovirus NOT DETECTED   Final   Rhinovirus NOT DETECTED   Final   Adenovirus NOT DETECTED   Final   Influenza A H1 NOT DETECTED   Final   Influenza A H3 NOT DETECTED   Final   Comment: (NOTE)           Normal Reference Range for each Analyte: NOT DETECTED     Testing performed using the Luminex xTAG Respiratory Viral Panel test     kit.     This test was developed and its performance characteristics determined     by [facility]. It has not been cleared or approved by the [place]     Food and Drug Administration. This test is used for clinical purposes.     It should not be regarded as investigational or for research. This     laboratory is certified under the [place] (CLIA) as qualified to perform high complexity     clinical laboratory testing.     Performed at [facility] DIFFICILE BY PCR     Status: None   Collection Time    05/06/13  6:06 PM      Result Value Ref Range Status   C difficile by pcr NEGATIVE  NEGATIVE Final     Studies: No results found.  Scheduled Meds: . antiseptic oral rinse  15 mL Mouth Rinse BID  . aspirin  325 mg Oral Daily  . digoxin  0.125 mg Oral Daily  . enoxaparin (LOVENOX) injection  40 mg Subcutaneous Q24H  . folic acid  1 mg Oral Daily  . gabapentin  100 mg Oral TID  . insulin glargine  20 Units Subcutaneous Daily  . lipase/protease/amylase  2 capsule Oral TID WC  . metoprolol tartrate  12.5 mg Oral BID  . multivitamin  5 mL Oral Daily  . pneumococcal 23 valent vaccine  0.5 mL Intramuscular Tomorrow-1000  . thiamine  100 mg Oral Daily   Continuous Infusions: . sodium chloride 75 mL/hr at 05/10/13 2257      Charlynne Cousins  [facility] Pager [phone]. If 8PM-8AM, please contact night-coverage at [email or web address], password [facility] 05/11/2013, 10:55 AM  LOS: 11 days

## 2013-05-11 NOTE — Progress Notes (Signed)
Hypoglycemic Event  CBG: 44  Treatment: 1 cup orange juice  Symptoms: none  Follow-up CBG: Time:0350 CBG Result:62  Possible Reasons for Event: pt got 60 units of lantus at day time  Comments/MD notified:protocol followed    Don Brown  Remember to initiate Hypoglycemia Order Set & complete

## 2013-05-11 NOTE — Progress Notes (Signed)
Physical Therapy Treatment Patient Details Name: Don Brown MRN: [id number] DOB: 1955/12/19 Today's Date: 05/11/2013 Time: 2956-2130 PT Time Calculation (min): 24 min  PT Assessment / Plan / Recommendation  History of Present Illness pt admitted for pancreatitis, type 2 diabetes   PT Comments   Pt requires min A for balance and safety with RW, improved activity tolerance vs last session.  Pt will benefit from continued skilled PT for safety and RW training.  Follow Up Recommendations  Home health PT     Does the patient have the potential to tolerate intense rehabilitation     Barriers to Discharge        Equipment Recommendations  Rolling walker with 5" wheels (bariatric)    Recommendations for Other Services    Frequency Min 3X/week   Progress towards PT Goals Progress towards PT goals: Progressing toward goals  Plan Current plan remains appropriate    Precautions / Restrictions Precautions Precautions: Fall Restrictions Weight Bearing Restrictions: No   Pertinent Vitals/Pain No c/o pain, pt states he is fatigued from being up all night.    Mobility  Bed Mobility Overal bed mobility: Needs Assistance Bed Mobility: Supine to Sit Supine to sit: Mod assist General bed mobility comments: pt requires mod A at trunk for supine to sit Transfers Overall transfer level: Needs assistance Equipment used: Rolling walker (2 wheeled) (bariatric) Sit to Stand: Min assist General transfer comment: pt requires min A for balance and safety Ambulation/Gait Ambulation/Gait assistance: Min assist Ambulation Distance (Feet): 200 Feet Assistive device: Rolling walker (2 wheeled) (bariatric) General Gait Details: pt requires multiple standing rest breaks, cues for deep breathing.  Pt with several LOB requiring min A to correct when ambulating in tight spaces or busy hallway.  Pt impulsive with RW, picking it up to move around obstacles, pt educated on safety but reports "I'll be fine"    Exercises     PT Diagnosis:    PT Problem List:   PT Treatment Interventions:     PT Goals (current goals can now be found in the care plan section)    Visit Information  Last PT Received On: 05/11/13 Assistance Needed: +1 History of Present Illness: pt admitted for pancreatitis, type 2 diabetes    Subjective Data      Cognition  Cognition Arousal/Alertness: Awake/alert Behavior During Therapy: Impulsive Overall Cognitive Status: Within Functional Limits for tasks assessed    Balance     End of Session PT - End of Session Equipment Utilized During Treatment: Gait belt Activity Tolerance: Patient limited by fatigue Patient left: in chair;with call bell/phone within reach Nurse Communication: Mobility status   GP     [name] 05/11/2013, 11:33 AM

## 2013-05-12 LAB — BASIC METABOLIC PANEL
BUN: 24 mg/dL — ABNORMAL HIGH (ref 6–23)
CO2: 26 meq/L (ref 19–32)
Calcium: 7.9 mg/dL — ABNORMAL LOW (ref 8.4–10.5)
Chloride: 104 mEq/L (ref 96–112)
Creatinine, Ser: 3.32 mg/dL — ABNORMAL HIGH (ref 0.50–1.35)
GFR calc Af Amer: 22 mL/min — ABNORMAL LOW (ref >90)
GFR calc non Af Amer: 19 mL/min — ABNORMAL LOW (ref >90)
Glucose, Bld: 150 mg/dL — ABNORMAL HIGH (ref 70–99)
POTASSIUM: 4.4 meq/L (ref 3.7–5.3)
SODIUM: 142 meq/L (ref 137–147)

## 2013-05-12 LAB — GLUCOSE, CAPILLARY
GLUCOSE-CAPILLARY: 160 mg/dL — AB (ref 70–99)
Glucose-Capillary: 104 mg/dL — ABNORMAL HIGH (ref 70–99)
Glucose-Capillary: 174 mg/dL — ABNORMAL HIGH (ref 70–99)
Glucose-Capillary: 190 mg/dL — ABNORMAL HIGH (ref 70–99)

## 2013-05-12 MED ORDER — ZOLPIDEM TARTRATE 5 MG PO TABS
5.0000 mg | ORAL_TABLET | Freq: Every evening | ORAL | Status: DC | PRN
  Administered 2013-05-16 (×2): 5 mg via ORAL
  Filled 2013-05-12 (×2): qty 1

## 2013-05-12 MED ORDER — HYDROCODONE-ACETAMINOPHEN 5-325 MG PO TABS
2.0000 | ORAL_TABLET | Freq: Four times a day (QID) | ORAL | Status: DC | PRN
  Administered 2013-05-12 – 2013-05-17 (×11): 2 via ORAL
  Filled 2013-05-12 (×6): qty 2
  Filled 2013-05-12: qty 1
  Filled 2013-05-12 (×4): qty 2

## 2013-05-12 MED ORDER — SODIUM CHLORIDE 0.9 % IV SOLN
INTRAVENOUS | Status: AC
  Administered 2013-05-12: 18:00:00 via INTRAVENOUS
  Administered 2013-05-12: 125 mL/h via INTRAVENOUS
  Administered 2013-05-13: 01:00:00 via INTRAVENOUS

## 2013-05-12 MED ORDER — ONDANSETRON 8 MG/NS 50 ML IVPB
8.0000 mg | Freq: Four times a day (QID) | INTRAVENOUS | Status: DC | PRN
  Administered 2013-05-13: 8 mg via INTRAVENOUS
  Filled 2013-05-12 (×2): qty 8

## 2013-05-12 NOTE — Progress Notes (Signed)
[facility] PROGRESS NOTE Interim History: 58 y.o. male who presented on 04/30/2013 with a past medical history of Chronic Pancreatitis; Atrial fibrillation; CHF; Morbid obesity; Chronic pain; Narcotic dependence; Respiratory failure; History of renal failure; History of ventral hernia repair; Cervical compression fracture; Hypertension; Hyperlipidemia; Degenerative joint disease; and Heart murmur who c/o altered mental status. Per history obtained from family, he was drinking excess amounts of fluid and V8 and urinating a lot for several days prior to admission. He was found on the floor by his family in the bathroom. He was noted to have a fever of 102 and a glucose of 900 with lactic acidosis  Assessment/Plan: Fever - Acute respiratory failure with hypoxia  - Source not confirmed but suspected to be pneumonia - ID has been following  - ID recommended last dose vancomycin and zosyn on 2/16  - Off antibiotics now  - WBC's WNL and no fever   Acute on Chronic pancreatitis  - Continue pancreatic enzymes  - tolerate diet, change narcotics to orals. Does not want oral narcotic. He want IV dilaudid. - I have explained to him that if he tolerating a diet he can take oral meds. - Llisinopril discontinue  - Continue PRN antiemetics and pain meds orally.  AKI: - due to vanc and ACE-I - Cont IV fluids Fena <1% - held lasix.  Atrial fibrillation  - Rate controlled  - anticoag has not been intitiated due to chronic thrombocytopenia.   CHF / Nonischemic cardiomyopathy  - EF 20-25% with diffuse hypokinesis  - Hold lasix and digoxin; ACE discontinue due to acute renal failure  - Low sodium diet  - Daily weight and strict intake and output. - Check dig level.  Hyperosmolar non-ketotic state in patient with new diagnosis type 2 diabetes mellitus  - cont Lantus and sliding scale  - Hemoglobin A1c 12.5  - Will need insulin at discharge  - Diabetes educator and nutrition service consulted  -  CBGs better controlled past 24 hours after adjustment in insulin dose  - Patient more active and engage on injecting insulin by himself.  Left lower extremity numbness and tingling  - Suspect related to neuropathy in patient with newly diagnosed diabetes that is uncontrolled  - Continue blood sugar control  - cont neurontin   Hypokalemia  - Will replete and check Mg level   H/o MGUS- 2 yrs ago  - hematology has evaluated - recommending bone marrow bx in future  - Previously saw Shadad. Please review outpatient notes and advise for outpatient follow up.   Code Status: full Family Communication: wife  Disposition Plan: inpatient   Consultants:  none  Procedures:  Ct abd  Echo  abd x-ray  Antibiotics:  vanc ans zosyn  HPI/Subjective: Abdominal pain Doe snot want oral narcotics.  Objective: Filed Vitals:   05/11/13 1503 05/11/13 2056 05/12/13 0513 05/12/13 1012  BP: 124/60 114/79 109/79 110/75  Pulse: 75 64 68 70  Temp: 98.6 F (37 C) 97.5 F (36.4 C) 97.9 F (36.6 C)   TempSrc: Oral Oral Oral   Resp: '18 22 21   ' Height:      Weight:   145.4 kg (320 lb 8.8 oz)   SpO2: 92% 92% 90%     Intake/Output Summary (Last 24 hours) at 05/12/13 1247 Last data filed at 05/12/13 1011  Gross per 24 hour  Intake   1200 ml  Output   1150 ml  Net     50 ml   [employer]  05/10/13 0500 05/11/13 0629 05/12/13 0513  Weight: 144.4 kg (318 lb 5.5 oz) 145.242 kg (320 lb 3.2 oz) 145.4 kg (320 lb 8.8 oz)    Exam:  General: Alert, awake, oriented x3, in no acute distress.  HEENT: No bruits, no goiter.  Heart: Regular rate and rhythm, without murmurs, rubs, gallops.  Lungs: Good air movement, clear to auscultation Abdomen: Soft, epigastric and left quadrant tenderness, nondistended, positive bowel sounds.     Data Reviewed: Basic Metabolic Panel:  Recent Labs Lab 05/07/13 0252 05/08/13 1314 05/09/13 0418 05/10/13 0324 05/11/13 1004 05/12/13 0643  NA 144 141  144 142 144 142  K 3.2* 4.2 3.5* 3.4* 4.2 4.4  CL 100 98 102 102 105 104  CO2 33* 34* '29 29 28 26  ' GLUCOSE 125* 148* 142* 108* 117* 150*  BUN 14 16 24* 25* 22 24*  CREATININE 1.01 1.15 2.02* 2.96* 3.24* 3.32*  CALCIUM 8.9 9.0 8.6 8.4 7.9* 7.9*  MG 1.9  --   --   --   --   --    Liver Function Tests:  Recent Labs Lab 05/09/13 0418 05/10/13 0324  AST 17 20  ALT 11 11  ALKPHOS 52 46  BILITOT 0.4 0.3  PROT 7.7 7.5  ALBUMIN 2.7* 2.6*    Recent Labs Lab 05/09/13 0418 05/10/13 0324  LIPASE 471* 39   No results found for this basename: AMMONIA,  in the last 168 hours CBC:  Recent Labs Lab 05/09/13 0418  WBC 4.3  HGB 12.6*  HCT 39.0  MCV 88.4  PLT 161   Cardiac Enzymes: No results found for this basename: CKTOTAL, CKMB, CKMBINDEX, TROPONINI,  in the last 168 hours BNP (last 3 results) No results found for this basename: PROBNP,  in the last 8760 hours CBG:  Recent Labs Lab 05/11/13 1924 05/11/13 1957 05/11/13 2144 05/12/13 0511 05/12/13 1153  GLUCAP 135* 134* 107* 104* 160*    Recent Results (from the past 240 hour(s))  URINE CULTURE     Status: None   Collection Time    05/03/13 11:40 AM      Result Value Ref Range Status   Specimen Description URINE, CATHETERIZED   Final   Special Requests NONE   Final   Culture  Setup Time     Final   Value: 05/03/2013 12:30     Performed at [place]     Final   Value: NO GROWTH     Performed at [facility]   Culture     Final   Value: NO GROWTH     Performed at [facility]   Report Status 05/04/2013 FINAL   Final  RESPIRATORY VIRUS PANEL     Status: None   Collection Time    05/03/13  1:51 PM      Result Value Ref Range Status   Source - RVPAN TRACHEAL ASPIRATE   Final   Respiratory Syncytial Virus A NOT DETECTED   Final   Respiratory Syncytial Virus B NOT DETECTED   Final   Influenza A NOT DETECTED   Final   Influenza B NOT DETECTED   Final   Parainfluenza 1  NOT DETECTED   Final   Parainfluenza 2 NOT DETECTED   Final   Parainfluenza 3 NOT DETECTED   Final   Metapneumovirus NOT DETECTED   Final   Rhinovirus NOT DETECTED   Final   Adenovirus NOT DETECTED   Final  Influenza A H1 NOT DETECTED   Final   Influenza A H3 NOT DETECTED   Final   Comment: (NOTE)           Normal Reference Range for each Analyte: NOT DETECTED     Testing performed using the Luminex xTAG Respiratory Viral Panel test     kit.     This test was developed and its performance characteristics determined     by [facility]. It has not been cleared or approved by the [place]     Food and Drug Administration. This test is used for clinical purposes.     It should not be regarded as investigational or for research. This     laboratory is certified under the [place] (CLIA) as qualified to perform high complexity     clinical laboratory testing.     Performed at [facility] DIFFICILE BY PCR     Status: None   Collection Time    05/06/13  6:06 PM      Result Value Ref Range Status   C difficile by pcr NEGATIVE  NEGATIVE Final     Studies: No results found.  Scheduled Meds: . antiseptic oral rinse  15 mL Mouth Rinse BID  . aspirin  325 mg Oral Daily  . digoxin  0.125 mg Oral Daily  . enoxaparin (LOVENOX) injection  40 mg Subcutaneous Q24H  . folic acid  1 mg Oral Daily  . gabapentin  100 mg Oral TID  . insulin glargine  20 Units Subcutaneous Daily  . lipase/protease/amylase  2 capsule Oral TID WC  . metoprolol tartrate  12.5 mg Oral BID  . multivitamin  5 mL Oral Daily  . pneumococcal 23 valent vaccine  0.5 mL Intramuscular Tomorrow-1000  . thiamine  100 mg Oral Daily   Continuous Infusions: . sodium chloride 125 mL/hr (05/12/13 1011)     Venetia Constable Marguarite Arbour  [facility] Pager [phone]. If 8PM-8AM, please contact night-coverage at [email or web address], password [facility] 05/12/2013, 12:47 PM   LOS: 12 days

## 2013-05-13 ENCOUNTER — Encounter (HOSPITAL_COMMUNITY): Payer: Self-pay | Admitting: Nephrology

## 2013-05-13 DIAGNOSIS — N179 Acute kidney failure, unspecified: Secondary | ICD-10-CM | POA: Diagnosis present

## 2013-05-13 DIAGNOSIS — I4891 Unspecified atrial fibrillation: Secondary | ICD-10-CM

## 2013-05-13 DIAGNOSIS — K861 Other chronic pancreatitis: Secondary | ICD-10-CM

## 2013-05-13 DIAGNOSIS — I509 Heart failure, unspecified: Secondary | ICD-10-CM

## 2013-05-13 LAB — BASIC METABOLIC PANEL
BUN: 22 mg/dL (ref 6–23)
CO2: 28 mEq/L (ref 19–32)
Calcium: 8.1 mg/dL — ABNORMAL LOW (ref 8.4–10.5)
Chloride: 109 mEq/L (ref 96–112)
Creatinine, Ser: 3.56 mg/dL — ABNORMAL HIGH (ref 0.50–1.35)
GFR calc Af Amer: 20 mL/min — ABNORMAL LOW (ref >90)
GFR calc non Af Amer: 17 mL/min — ABNORMAL LOW (ref >90)
Glucose, Bld: 169 mg/dL — ABNORMAL HIGH (ref 70–99)
Potassium: 5 mEq/L (ref 3.7–5.3)
Sodium: 147 mEq/L (ref 137–147)

## 2013-05-13 LAB — SODIUM, URINE, RANDOM: SODIUM UR: 50 meq/L

## 2013-05-13 LAB — URINALYSIS, ROUTINE W REFLEX MICROSCOPIC
BILIRUBIN URINE: NEGATIVE
GLUCOSE, UA: NEGATIVE mg/dL
Ketones, ur: NEGATIVE mg/dL
Leukocytes, UA: NEGATIVE
Nitrite: NEGATIVE
PH: 6 (ref 5.0–8.0)
Protein, ur: NEGATIVE mg/dL
Specific Gravity, Urine: 1.008 (ref 1.005–1.030)
Urobilinogen, UA: 0.2 mg/dL (ref 0.0–1.0)

## 2013-05-13 LAB — GLUCOSE, CAPILLARY
GLUCOSE-CAPILLARY: 185 mg/dL — AB (ref 70–99)
GLUCOSE-CAPILLARY: 137 mg/dL — AB (ref 70–99)
Glucose-Capillary: 111 mg/dL — ABNORMAL HIGH (ref 70–99)
Glucose-Capillary: 125 mg/dL — ABNORMAL HIGH (ref 70–99)
Glucose-Capillary: 142 mg/dL — ABNORMAL HIGH (ref 70–99)
Glucose-Capillary: 97 mg/dL (ref 70–99)

## 2013-05-13 LAB — PROTEIN / CREATININE RATIO, URINE
Creatinine, Urine: 34.95 mg/dL
PROTEIN CREATININE RATIO: 0.4 — AB (ref 0.00–0.15)
Total Protein, Urine: 13.9 mg/dL

## 2013-05-13 LAB — URINE MICROSCOPIC-ADD ON

## 2013-05-13 LAB — CK: Total CK: 35 U/L (ref 7–232)

## 2013-05-13 LAB — CREATININE, URINE, RANDOM: CREATININE, URINE: 34.56 mg/dL

## 2013-05-13 MED ORDER — SODIUM CHLORIDE 0.9 % IV SOLN: INTRAVENOUS | Status: DC

## 2013-05-13 MED ORDER — OXYCODONE HCL 5 MG PO TABS
5.0000 mg | ORAL_TABLET | Freq: Four times a day (QID) | ORAL | Status: DC | PRN
  Administered 2013-05-13 – 2013-05-15 (×5): 5 mg via ORAL
  Filled 2013-05-13 (×6): qty 1

## 2013-05-13 MED ORDER — INSULIN ASPART 100 UNIT/ML ~~LOC~~ SOLN
0.0000 [IU] | Freq: Three times a day (TID) | SUBCUTANEOUS | Status: DC
Start: 2013-05-13 — End: 2013-05-17
  Administered 2013-05-13: 1 [IU] via SUBCUTANEOUS
  Administered 2013-05-14: 2 [IU] via SUBCUTANEOUS
  Administered 2013-05-15: 3 [IU] via SUBCUTANEOUS
  Administered 2013-05-15: 2 [IU] via SUBCUTANEOUS
  Administered 2013-05-15 – 2013-05-17 (×5): 1 [IU] via SUBCUTANEOUS

## 2013-05-13 MED ORDER — SODIUM CHLORIDE 0.9 % IV BOLUS (SEPSIS): 500.0000 mL | Freq: Once | INTRAVENOUS | Status: DC

## 2013-05-13 MED ORDER — PANTOPRAZOLE SODIUM 40 MG PO TBEC
40.0000 mg | DELAYED_RELEASE_TABLET | Freq: Two times a day (BID) | ORAL | Status: DC
  Administered 2013-05-14 – 2013-05-16 (×5): 40 mg via ORAL
  Filled 2013-05-13 (×5): qty 1

## 2013-05-13 MED ORDER — INSULIN ASPART 100 UNIT/ML ~~LOC~~ SOLN
0.0000 [IU] | Freq: Every day | SUBCUTANEOUS | Status: DC
Start: 2013-05-13 — End: 2013-05-17
  Administered 2013-05-14: 3 [IU] via SUBCUTANEOUS

## 2013-05-13 MED ORDER — ONDANSETRON 8 MG/NS 50 ML IVPB: 8.0000 mg | Freq: Four times a day (QID) | INTRAVENOUS | Status: DC | PRN

## 2013-05-13 MED ORDER — ASPIRIN EC 81 MG PO TBEC
81.0000 mg | DELAYED_RELEASE_TABLET | Freq: Every day | ORAL | Status: DC
  Administered 2013-05-14 – 2013-05-16 (×3): 81 mg via ORAL
  Filled 2013-05-13 (×5): qty 1

## 2013-05-13 MED ORDER — LORAZEPAM 2 MG/ML IJ SOLN
0.5000 mg | Freq: Once | INTRAMUSCULAR | Status: AC
  Administered 2013-05-13: 0.5 mg via INTRAVENOUS
  Filled 2013-05-13 (×2): qty 1

## 2013-05-13 NOTE — Progress Notes (Signed)
Pt. Refused his Vicodin/Norco pill that I tried to administer to him in the presence of his daughter after he called nursing station stating he needed pain medication. Pt. Questioned why I was trying to scan his bracelet, I advised pt. So I could give him the pain medication he requested, in which pt. Stated I didn't ask for any pain medication. I notated pt. Refused medication and returned medication to [facility].

## 2013-05-13 NOTE — Progress Notes (Signed)
Went to the patient room this am with DR. Aileen Fass per MD request. MD trying to explain to the patient why Lisinoril and hydromorphone iv was d/c due to his kidney function. Patient get upset, yelling at the doctor, patient stated he will punch the doctor also yelling at me when i was trying to calm him down. Patient daughter came request to speak to the charge nurse,  i explain to the daughter what is been going on, daughter is not satisfied with my explanation she request to speak to the person in charge of the hospital. House coverage called, she said she will review the chart and come to talk to the patient and daughter. Also doctor  Feliz assigned another doctor to the patient.  Doctor Tat came to see patient .

## 2013-05-13 NOTE — Progress Notes (Signed)
I was called by Dr. Charlynne Cousins to assume care of this patient as the patient wanted a different attending physician.  I have reviewed the chart and examined the patient.  CV-RRR Lung- diminished BS at bases but CTA Abd--soft, +BS, diffusely tender without peritoneal signs Ext--+trace LE edema without erythema  When comfortably and sleeping. He arouses easily with voice stimuli.  2 separate RNs ([place]) were present during my conversation and exam. I allowed the patient to explain his concerns. The patient stated that he was nauseous and wanted intravenous hydromorphone. He felt that his oral medications have been causing him to have some nausea. I spoke with the patient's RN who stated that the patient ate 100% of his breakfast. However he did have one episode of small amount of emesis after eating some. Take this afternoon. I asked the patient if he had increasing abdominal pain. The patient states "I have abdominal pain all the time, sometimes it's worse, sometimes it's about the same." I told the patient that if he was having increasing abdominal pain, then it would be prudent to obtain further abdominal imaging to clarify the etiology. He stated that since he was nauseous and had an episode of emesis, that the only thing that helps is IV push of hydromorphone.  He pointed to a bag of IV zofran and stated "that costs more than the IV push of dilaudid and that bag is not working for me".  Review of the record reveals that the patient has been receiving multiple doses of po oxycodone and norco the past 48-72 hours.  He states that morphine makes him nauseous and therefore is listed as an allergy.  I offered additional antiemetic meds to which the patient stated that is not going to help my pain.  I told him that if his abdominal pain is worse imaging of his abdomen and blood work would be prudent.  The patient then demanded to be transferred to a separate facility, specifically [facility]. I  told the patient that I will try to arrange his transfer, but there is no guarantee. I told him that it is ultimately up to the receiving facility to make a decision was accept him. The patient then told me that he wanted to end conversation and stated that he wanted his daughter present before any further medical decisions be carried out.  I've ordered a morning CMP, CBC. Digoxin level has very been ordered. I discontinued the patient's Lantus. I placed the patient on NovoLog sliding scale. Nephrology was previously consulted for the patient's renal failure. The patient I will be happy to talk to his daughter when she arrives.  DTat

## 2013-05-13 NOTE — Progress Notes (Signed)
Pt. Advised Secretary that he wanted his pain medication. I went in pt.'s room with his pain medication to which pt. Stated that I hadn't given him any pain medication all day long. I reminded pt. That I'd given him 2 Oxy IR pills with his Creon pill right after breakfast and also reminded pt. That I'd come in at a separate time around 1330 to give Norco since Oxy IR was not yet due but he refused it in which his daughter confirmed that he'd refused it since she was in there when it was offered to him. Pt. Refused that medication after speaking with a New doctor he wanted on his case and told me if it was I.V. Push he didn't want the medication. I documented pt. Refused his pain medication once again and returned medication to [facility].

## 2013-05-13 NOTE — Progress Notes (Signed)
Pt. Refusing labs, MD made aware.

## 2013-05-13 NOTE — Progress Notes (Signed)
MD had ordered pt. A one time dose of 0.25 ml of Ativan to help calm pt. Per pt. And pt.'s daughter's request. I attempted to give pt. The Ativan along with his sliding scale evening insulin and other medications. Pt. Refused all medications except his 1 unit of insulin in which I gave. I documented pt. Refused other medications and discarded them in sharps container. Pt. Was sleeping when I entered the room and showed no signs or symptoms of distress.

## 2013-05-13 NOTE — Progress Notes (Signed)
Pt. Refused to take his morning medications because he was upset the Doctor wouldn't order him Dilaudid and stated he wasn't worried about taking anything I had for him and only wanted his D/C papers, I advised pt. That medically the doctor wouldn't write an order for him to be D/C'd but if he decided to leave on his own although we advise against it we couldn't stop him. Pt. Was adamant he wanted to be D/C'd from Hospital without any cost related to his care if he left AMA and requested to speak with Charge Nurse and MD on call for his case. I notated that pt. Refused all his 10 a.m.  Medications. Pt. Showed no signs or symptoms of distress.

## 2013-05-13 NOTE — Progress Notes (Signed)
Pt. Sent his grandson to front desk stating he wanted his pain pill now. I gave pt. 1 Oxy IR pill b/c order had been reduced from the 2 he had earlier. Pt. Agreed to take the pill and showed no signs or symptoms of distress at the time.

## 2013-05-13 NOTE — Progress Notes (Signed)
Pt. Called nursing station to request pain medication and complained of nausea. I went to pt.'s room and gave pt. 2 Oxy IR tablets and also gave pt. The Creon medication he refused with night shift nurse because he requested to take it after breakfast. I also gave pt. 8 mg Zofran IVPB once it was sent from Pharmacy to help with pt.'s nausea. Pt was upset when I initially entered the room because he felt the doctor's should've had an order for Dilaudid because the Oral pain medication made him nauseous and because he has an allergy to Morphine. I advised pt. That opioid or Narcotic pain medication has the potential to make someone nauseous. I advised pt. That I could only address what I could do to help him today and I apologized for him feeling as though he was disrespected by previous staff. Pt. Was refusing lab technician to take his blood because he was told he was going home today. I advised pt. No D/C orders in his chart and educated pt. That his Creatinine levels were elevated. Pt. Eventually allowed lab technician to take his blood to check his progress with his Creatinine levels.

## 2013-05-13 NOTE — Consult Note (Signed)
Reason for Consult:AKI Referring Physician: Olevia Bowens, MD  Slaton Courter is an 58 y.o. male.  HPI: Pt is a 57yo M with PMH sig for chronic pancreatitis, A fib, CHF, obesity, HTN, and chronic pain syndrome who was admitted on 04/30/13 with AMS, fever, elevated lactate, and glucose of 900 (new diagnosis).  Pt was admitted for IV antibiotics (including vanco and zosyn), intubated for airway protection, started on insulin, and eventually extubated and transferred to the floor.  Over the last 5 days, his Scr has risen for unclear reasons.  His lisinopril was stopped on 05/10/13 and his vanco trough level was 21 on 2/12 but none done since then and this was stopped on 2/17.  We were asked to help further evaluate and manage his AKI.  He did have a CT scan with contrast, however this was performed on 05/01/13.  He has had intermittent hypotension since admission, but overall has been stable over the last few days.  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  The trend in Scr is seen below:  Trend in Creatinine: Creatinine, Ser  Date/Time Value Ref Range Status  05/13/2013  8:30 AM 3.56* 0.50 - 1.35 mg/dL Final  05/12/2013  6:43 AM 3.32* 0.50 - 1.35 mg/dL Final  05/11/2013 10:04 AM 3.24* 0.50 - 1.35 mg/dL Final  05/10/2013  3:24 AM 2.96* 0.50 - 1.35 mg/dL Final  05/09/2013  4:18 AM 2.02* 0.50 - 1.35 mg/dL Final  05/08/2013  6:21 AM 1.15  0.50 - 1.35 mg/dL Final  05/07/2013  2:52 AM 1.01  0.50 - 1.35 mg/dL Final  05/06/2013  2:35 AM 0.86  0.50 - 1.35 mg/dL Final  05/05/2013  4:00 AM 1.14  0.50 - 1.35 mg/dL Final  05/04/2013  4:30 AM 1.19  0.50 - 1.35 mg/dL Final  05/03/2013  5:00 AM 1.27  0.50 - 1.35 mg/dL Final  05/02/2013  8:00 PM 1.29  0.50 - 1.35 mg/dL Final  05/02/2013  4:26 AM 0.96  0.50 - 1.35 mg/dL Final  05/01/2013  8:00 PM 1.12  0.50 - 1.35 mg/dL Final  05/01/2013  5:10 PM 1.09  0.50 - 1.35 mg/dL Final  05/01/2013  5:04 AM 1.44* 0.50 - 1.35 mg/dL Final  05/01/2013  3:04 AM 1.44* 0.50  - 1.35 mg/dL Final  05/01/2013  1:31 AM 1.04  0.50 - 1.35 mg/dL Final  04/30/2013 11:37 PM 1.11  0.50 - 1.35 mg/dL Final  04/30/2013  9:41 PM 1.40* 0.50 - 1.35 mg/dL Final  04/30/2013  9:27 PM 1.26  0.50 - 1.35 mg/dL Final  07/30/2011  9:39 AM 1.07  0.50 - 1.35 mg/dL Final  06/30/2011 12:26 PM 1.07  0.50 - 1.35 mg/dL Final  04/17/2011  8:39 AM 1.16  0.50 - 1.35 mg/dL Final  11/04/2010 12:56 PM 1.01  0.50 - 1.35 mg/dL Final  10/19/2010  1:58 PM 1.10  0.50 - 1.35 mg/dL Final  10/19/2010  1:40 PM 1.04  0.50 - 1.35 mg/dL Final  08/28/2010 10:32 AM 0.84  0.50 - 1.35 mg/dL Final  03/17/2010  5:23 AM 0.76  0.4 - 1.5 mg/dL Final  03/16/2010  5:43 AM 0.91  0.4 - 1.5 mg/dL Final  03/12/2010  5:35 AM 0.84  0.4 - 1.5 mg/dL Final  03/11/2010  2:25 PM 0.82  0.4 - 1.5 mg/dL Final  02/15/2010  3:55 AM 0.98  0.4 - 1.5 mg/dL Final  02/14/2010  8:00 AM 1.05  0.4 - 1.5 mg/dL Final  02/13/2010  4:00  AM 0.77  0.4 - 1.5 mg/dL Final  09/25/2009  5:23 AM 0.84  0.4 - 1.5 mg/dL Final  09/24/2009  3:30 AM 0.83  0.4 - 1.5 mg/dL Final  09/23/2009  5:53 AM 0.77  0.4 - 1.5 mg/dL Final    PMH:   Past Medical History  Diagnosis Date  . 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 Atrial fibrillation   . CHF (congestive heart failure)     Chronic systolic heart failure with an ejection fraction of 40%- 45%  . Morbid obesity   . Chronic pain   . Narcotic dependence, episodic use     chronic narcotic use  . Respiratory failure     History of ventilatory-dependent respiratory failure and tracheostomy   . History of renal failure     requiring hemodialysis in the past with normal renal function at this point  . History of ventral hernia repair   . Cervical compression fracture     Cervical disk disease requiring surgery  . Hypertension   . Hyperlipidemia   . Degenerative joint disease   . Heart murmur    . MGUS (monoclonal gammopathy of unknown significance)     PSH:   Past Surgical History  Procedure Laterality Date  . Ventral hernia repair    . Cholecystectomy    . Tracheostomy    . Pancreas surgery    . Pancreatic stents    . Skin graft    . Insertion of mesh      Allergies:  Allergies  Allergen Reactions  . Morphine And Related Nausea And Vomiting    Medications:   Prior to Admission medications   Medication Sig Start Date End Date Taking? Authorizing Provider  Amylase-Lipase-Protease (PANCREASE PO) Take 2 capsules by mouth 3 (three) times daily with meals.    Yes Historical Provider, MD  digoxin (LANOXIN) 0.125 MG tablet Take 125 mcg by mouth every morning.    Yes Historical Provider, MD  furosemide (LASIX) 40 MG tablet Take 40 mg by mouth daily as needed. Fluid 01/14/11  Yes Peter M Martinique, MD  metoprolol (TOPROL-XL) 50 MG 24 hr tablet Take 50 mg by mouth every morning.    Yes Historical Provider, MD  oxyCODONE (OXY IR/ROXICODONE) 5 MG immediate release tablet Take 10 mg by mouth every 6 (six) hours as needed for pain.   Yes Historical Provider, MD  promethazine (PHENERGAN) 25 MG tablet Take 25 mg by mouth every 8 (eight) hours as needed for nausea or vomiting.   Yes Historical Provider, MD  aspirin 81 MG tablet Take 81 mg by mouth daily before breakfast.     Historical Provider, MD  Cyanocobalamin (VITAMIN B 12 PO) Take 2 tablets by mouth daily.     Historical Provider, MD  Nyoka Cowden Tea, Camillia sinensis, (GREEN TEA PO) Take 2 tablets by mouth daily.     Historical Provider, MD  HYDROcodone-acetaminophen (NORCO) 5-325 MG per tablet Take 1 tablet by mouth every 6 (six) hours as needed. Pain     Historical Provider, MD  ibuprofen (ADVIL,MOTRIN) 200 MG tablet Take 200 mg by mouth every 6 (six) hours as needed for pain.    Historical Provider, MD  lisinopril (PRINIVIL,ZESTRIL) 10 MG tablet Take 10 mg by mouth daily.     Historical Provider, MD    Inpatient medications: .  antiseptic oral rinse  15 mL Mouth  Rinse BID  . aspirin EC  81 mg Oral Daily  . enoxaparin (LOVENOX) injection  40 mg Subcutaneous Q24H  . folic acid  1 mg Oral Daily  . gabapentin  100 mg Oral TID  . insulin aspart  0-5 Units Subcutaneous QHS  . insulin aspart  0-9 Units Subcutaneous TID WC  . lipase/protease/amylase  2 capsule Oral TID WC  . metoprolol tartrate  12.5 mg Oral BID  . multivitamin  5 mL Oral Daily  . pantoprazole  40 mg Oral BID  . pneumococcal 23 valent vaccine  0.5 mL Intramuscular Tomorrow-1000  . thiamine  100 mg Oral Daily    Discontinued Meds:   Medications Discontinued During This Encounter  Medication Reason  . dextrose 5 %-0.45 % sodium chloride infusion   . 0.9 %  sodium chloride infusion   . potassium chloride 10 mEq in 161 mL IVPB Duplicate  . vancomycin (VANCOCIN) 1,250 mg in sodium chloride 0.9 % 250 mL IVPB   . etomidate (AMIDATE) 2 MG/ML injection Returned to ADS  . vecuronium (NORCURON) 10 MG injection Returned to ADS  . 0.9 %  sodium chloride infusion   . heparin injection 5,000 Units   . famotidine (PEPCID) IVPB 20 mg   . feeding supplement (PRO-STAT SUGAR FREE 64) liquid 30 mL   . feeding supplement (VITAL HIGH PROTEIN) liquid 1,000 mL   . feeding supplement (VITAL HIGH PROTEIN) liquid 1,000 mL   . 0.9 %  sodium chloride infusion   . dexmedetomidine (PRECEDEX) 200 MCG/50ML infusion   . diltiazem (CARDIZEM) 100 mg in dextrose 5 % 100 mL infusion   . sodium chloride 0.9 % 1,000 mL with thiamine 096 mg, folic acid 1 mg, multivitamins adult 10 mL infusion   . dextrose 5 %-0.45 % sodium chloride infusion   . 0.9 %  sodium chloride infusion   . insulin regular (NOVOLIN R,HUMULIN R) 1 Units/mL in sodium chloride 0.9 % 100 mL infusion   . acetaminophen (TYLENOL) solution 500 mg   . acetaminophen (TYLENOL) solution 650 mg   . pantoprazole (PROTONIX) injection 40 mg   . insulin glargine (LANTUS) injection 10 Units   . insulin glargine (LANTUS)  injection 5 Units   . potassium chloride 20 MEQ/15ML (10%) liquid 40 mEq   . diltiazem (CARDIZEM) 100 mg in dextrose 5 % 100 mL infusion   . 0.9 %  sodium chloride infusion   . heparin ADULT infusion 100 units/mL (25000 units/250 mL)   . sodium chloride 0.9 % bolus 1,000 mL   . propofol (DIPRIVAN) 10 mg/ml infusion   . fentaNYL (SUBLIMAZE) injection 25 mcg   . fentaNYL (SUBLIMAZE) injection 50-100 mcg   . dexmedetomidine (PRECEDEX) 200 MCG/50ML infusion   . midazolam (VERSED) injection 2 mg   . feeding supplement (PRO-STAT SUGAR FREE 64) liquid 60 mL   . feeding supplement (VITAL HIGH PROTEIN) liquid 1,000 mL   . feeding supplement (VITAL HIGH PROTEIN) liquid 1,000 mL   . feeding supplement (VITAL HIGH PROTEIN) liquid 1,000 mL   . insulin glargine (LANTUS) injection 25 Units   . insulin glargine (LANTUS) injection 35 Units   . potassium chloride 20 MEQ/15ML (10%) liquid 20 mEq   . vancomycin (VANCOCIN) 1,500 mg in sodium chloride 0.9 % 500 mL IVPB   . insulin aspart (novoLOG) injection 3 Units   . potassium chloride 20 MEQ/15ML (10%) liquid 20 mEq   . diltiazem (CARDIZEM) 100 mg in dextrose 5 % 100 mL infusion   .  diltiazem (CARDIZEM) 10 mg/ml oral suspension 60 mg   . feeding supplement (PRO-STAT SUGAR FREE 64) liquid 30 mL   . feeding supplement (VITAL HIGH PROTEIN) liquid 1,000 mL   . fentaNYL (SUBLIMAZE) 10 mcg/mL in sodium chloride 0.9 % 250 mL infusion   . fentaNYL (SUBLIMAZE) injection 50-100 mcg   . free water 300 mL   . midazolam (VERSED) 1 mg/mL in sodium chloride 0.9 % 50 mL infusion   . midazolam (VERSED) injection 2 mg   . midazolam (VERSED) injection 4 mg   . pantoprazole sodium (PROTONIX) 40 mg/20 mL oral suspension 40 mg   . insulin aspart (novoLOG) injection 6 Units   . dextrose 10 % infusion   . chlorhexidine (PERIDEX) 0.12 % solution 15 mL Completed Course  . pantoprazole (PROTONIX) injection 40 mg   . haloperidol lactate (HALDOL) injection 5 mg   . vancomycin  (VANCOCIN) 1,250 mg in sodium chloride 0.9 % 250 mL IVPB   . insulin glargine (LANTUS) injection 45 Units   . pantoprazole (PROTONIX) EC tablet 40 mg   . diltiazem (CARDIZEM) 100 mg in dextrose 5 % 100 mL infusion   . antiseptic oral rinse (BIOTENE) solution 15 mL   . potassium chloride 10 mEq in 100 mL IVPB   . dextrose 50 % solution 25 mL   . insulin glargine (LANTUS) injection 55 Units   . piperacillin-tazobactam (ZOSYN) IVPB 3.375 g   . carvedilol (COREG) tablet 3.125 mg   . acetaminophen (TYLENOL) solution 650 mg   . gabapentin (NEURONTIN) capsule 100 mg   . insulin aspart (novoLOG) injection 0-15 Units   . gabapentin (NEURONTIN) capsule 100 mg   . lisinopril (PRINIVIL,ZESTRIL) tablet 10 mg   . hydrALAZINE (APRESOLINE) injection 10-40 mg   . furosemide (LASIX) tablet 40 mg   . HYDROcodone-acetaminophen (NORCO/VICODIN) 5-325 MG per tablet 1 tablet   . oxyCODONE (Oxy IR/ROXICODONE) immediate release tablet 10 mg   . 0.9 %  sodium chloride infusion   . insulin aspart (novoLOG) injection 0-15 Units   . insulin glargine (LANTUS) injection 60 Units   . HYDROmorphone (DILAUDID) injection 1 mg   . HYDROmorphone (DILAUDID) injection 0.5 mg   . HYDROcodone-acetaminophen (NORCO/VICODIN) 5-325 MG per tablet 1 tablet   . ondansetron (ZOFRAN) injection 4 mg   . digoxin (LANOXIN) tablet 0.125 mg   . ondansetron (ZOFRAN) 8 mg/NS 50 ml IVPB Duplicate  . aspirin tablet 325 mg   . hydrALAZINE (APRESOLINE) injection 10 mg   . 0.9 %  sodium chloride infusion   . sodium chloride 0.9 % bolus 500 mL   . 0.9 %  sodium chloride infusion   . loperamide (IMODIUM) capsule 4 mg   . oxyCODONE (Oxy IR/ROXICODONE) immediate release tablet 10 mg   . 0.9 %  sodium chloride infusion   . insulin glargine (LANTUS) injection 20 Units     Social History:  reports that he has never smoked. He does not have any smokeless tobacco history on file. He reports that he does not drink alcohol or use illicit  drugs.  Family History:   Family History  Problem Relation Age of Onset  . Hypertension Mother   . Diabetes Mother   . Lymphoma Father     A comprehensive review of systems was negative except for: Gastrointestinal: positive for abdominal pain and nausea Weight change: 3.561 kg (7 lb 13.6 oz)  Intake/Output Summary (Last 24 hours) at 05/13/13 1518 Last data filed at 05/13/13 1352  Gross per  24 hour  Intake 4767.09 ml  Output   2725 ml  Net 2042.09 ml   BP 137/93  Pulse 90  Temp(Src) 98.7 F (37.1 C) (Oral)  Resp 20  Ht 6' (1.829 m)  Wt 148.961 kg (328 lb 6.4 oz)  BMI 44.53 kg/m2  SpO2 91% Filed Vitals:   05/12/13 1012 05/12/13 2122 05/13/13 0500 05/13/13 1347  BP: 110/75 153/76 116/78 137/93  Pulse: 70 89 78 90  Temp:  98.3 F (36.8 C) 97.6 F (36.4 C) 98.7 F (37.1 C)  TempSrc:  Oral Oral Oral  Resp:  18 22 20   Height:      Weight:   148.961 kg (328 lb 6.4 oz)   SpO2:  93% 99% 91%     General appearance: cooperative, no distress and morbidly obese Head: Normocephalic, without obvious abnormality, atraumatic Resp: clear to auscultation bilaterally Cardio: regular rate and rhythm, S1, S2 normal, no murmur, click, rub or gallop GI: obese, +BS, soft, +tenderness to deep palpation Extremities: edema minimal pretib Neurologic: Grossly normal  Labs: Basic Metabolic Panel:  Recent Labs Lab 05/07/13 0252 05/08/13 0621 05/09/13 0418 05/10/13 0324 05/11/13 1004 05/12/13 0643 05/13/13 0830  NA 144 141 144 142 144 142 147  K 3.2* 4.2 3.5* 3.4* 4.2 4.4 5.0  CL 100 98 102 102 105 104 109  CO2 33* 34* 29 29 28 26 28   GLUCOSE 125* 148* 142* 108* 117* 150* 169*  BUN 14 16 24* 25* 22 24* 22  CREATININE 1.01 1.15 2.02* 2.96* 3.24* 3.32* 3.56*  ALBUMIN  --   --  2.7* 2.6*  --   --   --   CALCIUM 8.9 9.0 8.6 8.4 7.9* 7.9* 8.1*   Liver Function Tests:  Recent Labs Lab 05/09/13 0418 05/10/13 0324  AST 17 20  ALT 11 11  ALKPHOS 52 46  BILITOT 0.4 0.3  PROT  7.7 7.5  ALBUMIN 2.7* 2.6*    Recent Labs Lab 05/09/13 0418 05/10/13 0324  LIPASE 471* 39   No results found for this basename: AMMONIA,  in the last 168 hours CBC:  Recent Labs Lab 05/09/13 0418  WBC 4.3  HGB 12.6*  HCT 39.0  MCV 88.4  PLT 161   PT/INR: @LABRCNTIP (inr:5) Cardiac Enzymes: )No results found for this basename: CKTOTAL, CKMB, CKMBINDEX, TROPONINI,  in the last 168 hours CBG:  Recent Labs Lab 05/12/13 1552 05/12/13 2215 05/13/13 0443 05/13/13 0627 05/13/13 1107  GLUCAP 174* 190* 125* 111* 185*    Iron Studies: No results found for this basename: IRON, TIBC, TRANSFERRIN, FERRITIN,  in the last 168 hours  Xrays/Other Studies: No results found.   Assessment/Plan: 1.  AKI, non-oliguric- unclear etiology.  Initially had SIRS and IV contrast but did not have change in Scr but now after multiple doses of Vanco and Zosyn, now with rising Scr and good UOP.  He is off ACE- I and no recent IV contrast or NSAIDS. 1. Will check serologies and renal US 2. Check urine studies and eosinophils to r/o AIN (he has been on abx since admission) 3. No evidence of uremia and will cont to follow 4. Cont to hold ACE and vanco 2. Chronic pain syndrome- per primary svc 3. Nausea- per primary this is chronic and not related to AKI 4. Morbid obesity 5. DM s/p Hyperosmolar, nonketotic acidosis s/p IV insulin and now on sq 6. HTN- stable   Karilyn Wind A 05/13/2013, 3:18 PM

## 2013-05-13 NOTE — Progress Notes (Signed)
I would like to speak with the patient and his daughter at the bedside. The patient stated that he has intermittent abdominal pain, but now stated that it is not worsen.again, the patient stated that the IV Zofran was not helping his abdominal pain. He requested intravenous hydromorphone to help with his nausea and vomiting. He stated that intravenous Zofran was not helping. I stated to the patient thatif his abdominal pain was continued to worsen that I would recommend abdominal imaging including a CT of the abdomen and pelvis. I told the patient I was not willing to give him any intravenous hydromorphone to help with his nausea.I offered to give the patient the different antiemetic. The patient subsequently became belligerent. He asked me if I was a specialist and whatever physician I was.  I told him I am a general internist and that I would be willing to ask for a specialist to see him regarding his abdominal pain.  He then stated that "if you don't want to listen to me or give me what I want, then get out of my room.  I don't want to see you again."  RN Nicholes Rough was present during my interaction.   The patient will be reassigned to a different attending on 05/14/13.   Total time spent with patient and reviewing his medical record 65 minutes.  DTat

## 2013-05-13 NOTE — Progress Notes (Signed)
Pt. Called nurses station and stated he wanted to take his Lantus 20 units that he'd refused earlier. I administered the 20 units of Lantus to pt. Per his request and passed on to next shift if Lantus rescheduled for bedtime to not give again because it had been given although given late.

## 2013-05-13 NOTE — Progress Notes (Addendum)
[facility] PROGRESS NOTE Interim History: 58 y.o. male who presented on 04/30/2013 with a past medical history of Chronic Pancreatitis; Atrial fibrillation; CHF; Morbid obesity; Chronic pain; Narcotic dependence; Respiratory failure; History of renal failure; History of ventral hernia repair; Cervical compression fracture; Hypertension; Hyperlipidemia; Degenerative joint disease; and Heart murmur who c/o altered mental status. Per history obtained from family, he was drinking excess amounts of fluid and V8 and urinating a lot for several days prior to admission. He was found on the floor by his family in the bathroom. He was noted to have a fever of 102 and a glucose of 900 with lactic acidosis  Assessment/Plan: Fever - Acute respiratory failure with hypoxia  - Source not confirmed but suspected to be pneumonia - ID has been following  - ID recommended last dose vancomycin and zosyn on 2/16  - Off antibiotics now  - WBC's WNL and no fever   Acute on Chronic pancreatitis  - Continue pancreatic enzymes.   - tolerate diet, change narcotics to orals. Does not want oral narcotic relates causes him to be nauseated yet he has been tolerating his meals.He want IV dilaudid. - I have explained to him that if he tolerating a diet he can take oral meds. - He gave [place] an ultimatum that he either wants IV Dilaudid or be discharge. During this conversation the pt was belligerent and abusive. Requested another MD. - Continue PRN antiemetics and pain meds orally.  AKI: - Cr cont to be high.  - Multifactorial due to Vanc., ACE-I and pre-renal - Cont IV fluids Fena <1%. Will be carefull with IV fluids as he does have EF 20%. He is now becoming positive in regards to fluid balance. - Cont to hold lasix. Consulted renal.  Atrial fibrillation  - Rate controlled  - anticoag has not been intitiated due to chronic thrombocytopenia.   CHF / Nonischemic cardiomyopathy  - EF 20-25% with diffuse hypokinesis   - Hold lasix and digoxin; ACE discontinue due to acute renal failure. Dig. level in am - Low sodium diet  - Daily weight and strict intake and output.  Hyperosmolar non-ketotic state in patient with new diagnosis type 2 diabetes mellitus  - Cont Lantus and sliding scale. Pt refused carb modified diet. He relates he wants a regular diet. - Hemoglobin A1c 12.5  - Will need insulin at discharge. Have decrease insulin due to AKI. - Diabetes educator and nutrition service consulted  - CBGs better controlled past 24 hours after adjustment in insulin dose  - Patient more active and engage on injecting insulin by himself.  Left lower extremity numbness and tingling  - Suspect related to neuropathy in patient with newly diagnosed diabetes that is uncontrolled  - Continue blood sugar control  - cont neurontin   Hypokalemia  - Will replete and check Mg level.  H/o MGUS- 2 yrs ago: - hematology has evaluated - recommending bone marrow bx in future.  - Previously saw [place]. Please review outpatient notes and advise for outpatient follow up.   Code Status: full Family Communication: wife  Disposition Plan: inpatient   Consultants:  none  Procedures:  Ct abd  Echo  abd x-ray  Antibiotics:  vanc ans zosyn  HPI/Subjective: Does not want oral narcotics. Want IV Dilaudid, as oral causes him to have Nausea. Pt aggressive and belligerent. Nurse in room while he was becoming agitated. He gave [place] an ultimatum either d/c change him or change him to IV pain medications  Objective:  Filed Vitals:   05/12/13 0513 05/12/13 1012 05/12/13 2122 05/13/13 0500  BP: 109/79 110/75 153/76 116/78  Pulse: 68 70 89 78  Temp: 97.9 F (36.6 C)  98.3 F (36.8 C) 97.6 F (36.4 C)  TempSrc: Oral  Oral Oral  Resp: _0 Height:      Weight: 145.4 kg (320 lb 8.8 oz)   148.961 kg (328 lb 6.4 oz)  SpO2: 90%  93% 99%    Intake/Output Summary (Last 24 hours) at 05/13/13 0959 Last data filed  at 05/13/13 8325  Gross per 24 hour  Intake 5007.09 ml  Output   2100 ml  Net 2907.09 ml   Filed Weights   05/11/13 0629 05/12/13 0513 05/13/13 0500  Weight: 145.242 kg (320 lb 3.2 oz) 145.4 kg (320 lb 8.8 oz) 148.961 kg (328 lb 6.4 oz)    Exam:  General: Alert, awake, oriented x3, in no acute distress.  HEENT: No bruits, no goiter.  Heart: Regular rate and rhythm, without murmurs, rubs, gallops.  Lungs: Good air movement, clear to auscultation Abdomen: Soft, epigastric and left quadrant tenderness, nondistended, positive bowel sounds.     Data Reviewed: Basic Metabolic Panel:  Recent Labs Lab 05/07/13 0252  05/09/13 0418 05/10/13 0324 05/11/13 1004 05/12/13 0643 05/13/13 0830  NA 144  < > 144 142 144 142 147  K 3.2*  < > 3.5* 3.4* 4.2 4.4 5.0  CL 100  < > 102 102 105 104 109  CO2 33*  < > _1 GLUCOSE 125*  < > 142* 108* 117* 150* 169*  BUN 14  < > 24* 25* 22 24* 22  CREATININE 1.01  < > 2.02* 2.96* 3.24* 3.32* 3.56*  CALCIUM 8.9  < > 8.6 8.4 7.9* 7.9* 8.1*  MG 1.9  --   --   --   --   --   --   < > = values in this interval not displayed. Liver Function Tests:  Recent Labs Lab 05/09/13 0418 05/10/13 0324  AST 17 20  ALT 11 11  ALKPHOS 52 46  BILITOT 0.4 0.3  PROT 7.7 7.5  ALBUMIN 2.7* 2.6*    Recent Labs Lab 05/09/13 0418 05/10/13 0324  LIPASE 471* 39   No results found for this basename: AMMONIA,  in the last 168 hours CBC:  Recent Labs Lab 05/09/13 0418  WBC 4.3  HGB 12.6*  HCT 39.0  MCV 88.4  PLT 161   Cardiac Enzymes: No results found for this basename: CKTOTAL, CKMB, CKMBINDEX, TROPONINI,  in the last 168 hours BNP (last 3 results) No results found for this basename: PROBNP,  in the last 8760 hours CBG:  Recent Labs Lab 05/12/13 1153 05/12/13 1552 05/12/13 2215 05/13/13 0443 05/13/13 0627  GLUCAP 160* 174* 190* 125* 111*    Recent Results (from the past 240 hour(s))  URINE CULTURE     Status: None    Collection Time    05/03/13 11:40 AM      Result Value Ref Range Status   Specimen Description URINE, CATHETERIZED   Final   Special Requests NONE   Final   Culture  Setup Time     Final   Value: 05/03/2013 12:30     Performed at [place]     Final   Value: NO GROWTH     Performed at [place]     Final  Value: NO GROWTH     Performed at [facility]   Report Status 05/04/2013 FINAL   Final  RESPIRATORY VIRUS PANEL     Status: None   Collection Time    05/03/13  1:51 PM      Result Value Ref Range Status   Source - RVPAN TRACHEAL ASPIRATE   Final   Respiratory Syncytial Virus A NOT DETECTED   Final   Respiratory Syncytial Virus B NOT DETECTED   Final   Influenza A NOT DETECTED   Final   Influenza B NOT DETECTED   Final   Parainfluenza 1 NOT DETECTED   Final   Parainfluenza 2 NOT DETECTED   Final   Parainfluenza 3 NOT DETECTED   Final   Metapneumovirus NOT DETECTED   Final   Rhinovirus NOT DETECTED   Final   Adenovirus NOT DETECTED   Final   Influenza A H1 NOT DETECTED   Final   Influenza A H3 NOT DETECTED   Final   Comment: (NOTE)           Normal Reference Range for each Analyte: NOT DETECTED     Testing performed using the Luminex xTAG Respiratory Viral Panel test     kit.     This test was developed and its performance characteristics determined     by [facility]. It has not been cleared or approved by the [place]     Food and Drug Administration. This test is used for clinical purposes.     It should not be regarded as investigational or for research. This     laboratory is certified under the [place] (CLIA) as qualified to perform high complexity     clinical laboratory testing.     Performed at [facility] DIFFICILE BY PCR     Status: None   Collection Time    05/06/13  6:06 PM      Result Value Ref Range Status   C difficile by pcr  NEGATIVE  NEGATIVE Final     Studies: No results found.  Scheduled Meds: . antiseptic oral rinse  15 mL Mouth Rinse BID  . aspirin  325 mg Oral Daily  . enoxaparin (LOVENOX) injection  40 mg Subcutaneous Q24H  . folic acid  1 mg Oral Daily  . gabapentin  100 mg Oral TID  . insulin glargine  20 Units Subcutaneous Daily  . lipase/protease/amylase  2 capsule Oral TID WC  . metoprolol tartrate  12.5 mg Oral BID  . multivitamin  5 mL Oral Daily  . pneumococcal 23 valent vaccine  0.5 mL Intramuscular Tomorrow-1000  . sodium chloride  500 mL Intravenous Once  . thiamine  100 mg Oral Daily   Continuous Infusions: . sodium chloride       Charlynne Cousins  [facility] Pager [phone]. If 8PM-8AM, please contact night-coverage at [email or web address], password [facility] 05/13/2013, 9:59 AM  LOS: 13 days

## 2013-05-14 ENCOUNTER — Inpatient Hospital Stay (HOSPITAL_COMMUNITY): Payer: Non-veteran care

## 2013-05-14 DIAGNOSIS — E875 Hyperkalemia: Secondary | ICD-10-CM | POA: Diagnosis not present

## 2013-05-14 LAB — COMPREHENSIVE METABOLIC PANEL
ALK PHOS: 49 U/L (ref 39–117)
ALT: 9 U/L (ref 0–53)
AST: 14 U/L (ref 0–37)
Albumin: 2.5 g/dL — ABNORMAL LOW (ref 3.5–5.2)
BILIRUBIN TOTAL: 0.3 mg/dL (ref 0.3–1.2)
BUN: 20 mg/dL (ref 6–23)
CHLORIDE: 108 meq/L (ref 96–112)
CO2: 26 mEq/L (ref 19–32)
CREATININE: 3.22 mg/dL — AB (ref 0.50–1.35)
Calcium: 8.4 mg/dL (ref 8.4–10.5)
GFR calc non Af Amer: 20 mL/min — ABNORMAL LOW (ref >90)
GFR, EST AFRICAN AMERICAN: 23 mL/min — AB (ref >90)
Glucose, Bld: 152 mg/dL — ABNORMAL HIGH (ref 70–99)
Potassium: 5.3 mEq/L (ref 3.7–5.3)
Sodium: 143 mEq/L (ref 137–147)
Total Protein: 7.8 g/dL (ref 6.0–8.3)

## 2013-05-14 LAB — CBC
HEMATOCRIT: 36.2 % — AB (ref 39.0–52.0)
HEMOGLOBIN: 11 g/dL — AB (ref 13.0–17.0)
MCH: 27.9 pg (ref 26.0–34.0)
MCHC: 30.4 g/dL (ref 30.0–36.0)
MCV: 91.9 fL (ref 78.0–100.0)
Platelets: 134 10*3/uL — ABNORMAL LOW (ref 150–400)
RBC: 3.94 MIL/uL — ABNORMAL LOW (ref 4.22–5.81)
RDW: 15.8 % — ABNORMAL HIGH (ref 11.5–15.5)
WBC: 3.5 10*3/uL — AB (ref 4.0–10.5)

## 2013-05-14 LAB — HEPATITIS PANEL, ACUTE
HCV Ab: NEGATIVE
HEP A IGM: NONREACTIVE
HEP B C IGM: NONREACTIVE
HEP B S AG: NEGATIVE

## 2013-05-14 LAB — GLUCOSE, CAPILLARY
GLUCOSE-CAPILLARY: 110 mg/dL — AB (ref 70–99)
Glucose-Capillary: 103 mg/dL — ABNORMAL HIGH (ref 70–99)
Glucose-Capillary: 162 mg/dL — ABNORMAL HIGH (ref 70–99)
Glucose-Capillary: 237 mg/dL — ABNORMAL HIGH (ref 70–99)

## 2013-05-14 LAB — DIGOXIN LEVEL: Digoxin Level: 0.5 ng/mL — ABNORMAL LOW (ref 0.8–2.0)

## 2013-05-14 MED ORDER — CLONIDINE HCL 0.1 MG PO TABS: 0.1000 mg | ORAL_TABLET | Freq: Two times a day (BID) | ORAL | Status: DC

## 2013-05-14 MED ORDER — ONDANSETRON HCL 4 MG/2ML IJ SOLN
4.0000 mg | Freq: Four times a day (QID) | INTRAMUSCULAR | Status: DC | PRN
  Administered 2013-05-14: 4 mg via INTRAVENOUS
  Filled 2013-05-14 (×2): qty 2

## 2013-05-14 MED ORDER — CLONIDINE HCL 0.1 MG PO TABS
0.1000 mg | ORAL_TABLET | Freq: Two times a day (BID) | ORAL | Status: DC | PRN
  Filled 2013-05-14: qty 1

## 2013-05-14 MED ORDER — ENOXAPARIN SODIUM 80 MG/0.8ML ~~LOC~~ SOLN
75.0000 mg | SUBCUTANEOUS | Status: DC
  Filled 2013-05-14 (×4): qty 0.8

## 2013-05-14 MED ORDER — SODIUM POLYSTYRENE SULFONATE 15 GM/60ML PO SUSP
15.0000 g | Freq: Once | ORAL | Status: DC
  Filled 2013-05-14: qty 60

## 2013-05-14 NOTE — Progress Notes (Signed)
Patient ID: Don Brown, male   DOB: 1955-04-16, 58 y.o.   MRN: [id number] S:still doesn't feel well O:BP 146/78  Pulse 74  Temp(Src) 98.2 F (36.8 C) (Oral)  Resp 22  Ht 6' (1.829 m)  Wt 150.2 kg (331 lb 2.1 oz)  BMI 44.90 kg/m2  SpO2 90%  Intake/Output Summary (Last 24 hours) at 05/14/13 1058 Last data filed at 05/14/13 1013  Gross per 24 hour  Intake    600 ml  Output   5050 ml  Net  -4450 ml   Intake/Output: I/O last 3 completed shifts: In: 3795.4 [P.O.:2160; I.V.:1635.4] Out: 2197 [Urine:5175]  Intake/Output this shift:  Total I/O In: 120 [P.O.:120] Out: 1225 [Urine:1225] Weight change: 1.239 kg (2 lb 11.7 oz) Gen:WD obese AAM in NAd CVS:no rub Resp:cta JOI:TGPQD +BS, soft, NT Ext:no edema   Recent Labs Lab 05/08/13 0621 05/09/13 0418 05/10/13 0324 05/11/13 1004 05/12/13 0643 05/13/13 0830 05/14/13 0744  NA 141 144 142 144 142 147 143  K 4.2 3.5* 3.4* 4.2 4.4 5.0 5.3  CL 98 102 102 105 104 109 108  CO2 34* _0 GLUCOSE 148* 142* 108* 117* 150* 169* 152*  BUN 16 24* 25* 22 24* 22 20  CREATININE 1.15 2.02* 2.96* 3.24* 3.32* 3.56* 3.22*  ALBUMIN  --  2.7* 2.6*  --   --   --  2.5*  CALCIUM 9.0 8.6 8.4 7.9* 7.9* 8.1* 8.4  AST  --  17 20  --   --   --  14  ALT  --  11 11  --   --   --  9   Liver Function Tests:  Recent Labs Lab 05/09/13 0418 05/10/13 0324 05/14/13 0744  AST _1 ALT _2 ALKPHOS 52 46 49  BILITOT 0.4 0.3 0.3  PROT 7.7 7.5 7.8  ALBUMIN 2.7* 2.6* 2.5*    Recent Labs Lab 05/09/13 0418 05/10/13 0324  LIPASE 471* 39   No results found for this basename: AMMONIA,  in the last 168 hours CBC:  Recent Labs Lab 05/09/13 0418 05/14/13 0744  WBC 4.3 3.5*  HGB 12.6* 11.0*  HCT 39.0 36.2*  MCV 88.4 91.9  PLT 161 134*   Cardiac Enzymes:  Recent Labs Lab 05/13/13 1930  CKTOTAL 35   CBG:  Recent Labs Lab 05/13/13 1107 05/13/13 1643 05/13/13 2127 05/13/13 2348 05/14/13 0521  GLUCAP 185* 142*  137* 97 103*    Iron Studies: No results found for this basename: IRON, TIBC, TRANSFERRIN, FERRITIN,  in the last 72 hours Studies/Results: No results found. Marland Kitchen antiseptic oral rinse  15 mL Mouth Rinse BID  . aspirin EC  81 mg Oral Daily  . enoxaparin (LOVENOX) injection  40 mg Subcutaneous Q24H  . folic acid  1 mg Oral Daily  . gabapentin  100 mg Oral TID  . insulin aspart  0-5 Units Subcutaneous QHS  . insulin aspart  0-9 Units Subcutaneous TID WC  . lipase/protease/amylase  2 capsule Oral TID WC  . metoprolol tartrate  12.5 mg Oral BID  . multivitamin  5 mL Oral Daily  . pantoprazole  40 mg Oral BID  . pneumococcal 23 valent vaccine  0.5 mL Intramuscular Tomorrow-1000  . thiamine  100 mg Oral Daily    BMET    Component Value Date/Time   NA 143 05/14/2013 0744   K 5.3 05/14/2013 0744   CL 108 05/14/2013 0744   CO2 26  05/14/2013 0744   GLUCOSE 152* 05/14/2013 0744   BUN 20 05/14/2013 0744   CREATININE 3.22* 05/14/2013 0744   CALCIUM 8.4 05/14/2013 0744   GFRNONAA 20* 05/14/2013 0744   GFRAA 23* 05/14/2013 0744   CBC    Component Value Date/Time   WBC 3.5* 05/14/2013 0744   WBC 3.2* 07/30/2011 0939   RBC 3.94* 05/14/2013 0744   RBC 5.10 07/30/2011 0939   HGB 11.0* 05/14/2013 0744   HGB 14.2 07/30/2011 0939   HCT 36.2* 05/14/2013 0744   HCT 44.5 07/30/2011 0939   PLT 134* 05/14/2013 0744   PLT 138* 07/30/2011 0939   MCV 91.9 05/14/2013 0744   MCV 87.3 07/30/2011 0939   MCH 27.9 05/14/2013 0744   MCH 27.8 07/30/2011 0939   MCHC 30.4 05/14/2013 0744   MCHC 31.8* 07/30/2011 0939   RDW 15.8* 05/14/2013 0744   RDW 15.4* 07/30/2011 0939   LYMPHSABS 1.8 04/30/2013 2127   LYMPHSABS 1.0 07/30/2011 0939   MONOABS 0.2 04/30/2013 2127   MONOABS 0.2 07/30/2011 0939   EOSABS 0.1 04/30/2013 2127   EOSABS 0.1 07/30/2011 0939   BASOSABS 0.0 04/30/2013 2127   BASOSABS 0.0 07/30/2011 0939    Assessment/Plan:  1. AKI, non-oliguric with microscopic hematuria- unclear etiology. Initially had SIRS and IV contrast but did not  have change in Scr but now after multiple doses of Vanco and Zosyn, now with rising Scr and good UOP. He is off ACE- I and no recent IV contrast or NSAIDS. Urine IFE shows a monoclonal IgG heavy chain with associated Kappa light chain and excess monoclonal free Kappa light chains (Bence Jones proteins). 1. Will check serologies and renal US- results pending 2. Check urine studies and eosinophils to r/o AIN (he has been on abx since admission) 3. Scr improving overnight 4. Cont to hold ACE and vanco 2. Chronic pain syndrome- per primary svc 3. Nausea- per primary this is chronic and not related to AKI 4. Hyperkalemia- will give kayexalate as he is c/o constipation as well. 5. Morbid obesity 6. DM s/p Hyperosmolar, nonketotic acidosis s/p IV insulin and now on sq 7. HTN- stable 8. MGUS- +UPEP with 2.8gm IgG with kappa light chains.  Hgb only mildly low. Seen in the past by Hematology and per pt was to have a bone marrow biopsy but never went back.  Consider curb side to Heme/Onc and review if BM biopsy still indicated since the total value has been declining over the last 2 years.  Cont to follow.    , A 

## 2013-05-14 NOTE — Progress Notes (Signed)
[facility] PROGRESS NOTE Interim History: 58 y.o. male who presented on 04/30/2013 with a past medical history of Chronic Pancreatitis; Atrial fibrillation; CHF; Morbid obesity; Chronic pain; Narcotic dependence; Respiratory failure; History of renal failure; History of ventral hernia repair; Cervical compression fracture; Hypertension; Hyperlipidemia; Degenerative joint disease; and Heart murmur who c/o altered mental status. Per history obtained from family, he was drinking excess amounts of fluid and V8 and urinating a lot for several days prior to admission. He was found on the floor by his family in the bathroom. He was noted to have a fever of 102 and a glucose of 900 with lactic acidosis  Assessment/Plan: Fever - Acute respiratory failure with hypoxia  - Source not confirmed but suspected to be pneumonia - ID has been following  - ID recommended last dose vancomycin and zosyn on 2/16  - Off antibiotics now  - WBC's WNL and no fever   Acute on Chronic pancreatitis  - Continue pancreatic enzymes.   - tolerate diet, change narcotics to orals. Does not want oral narcotic relates causes him to be nauseated yet he has been tolerating his meals.He want IV dilaudid. When my partner refused, the patient became belligerent and fired him. When a second hospitalist saw the patient and refused narcotics, he too was fired. - Continue PRN antiemetics and pain meds orally.  AKI: - Cr starting to trend. Appreciate nephrology help. Felt to be mostly from vancomycin. - Multifactorial due to Vanc., ACE-I and pre-renal - Cont IV fluids Fena <1%. Will be carefull with IV fluids as he does have EF 20%. He is now becoming positive in regards to fluid balance. - Cont to hold lasix. Consulted renal. For Kayexalate today, which may worsen his function. Also, with renal function normal will really need to restart Lasix.  Atrial fibrillation  - Rate controlled  - anticoag has not been intitiated due to  chronic thrombocytopenia.   CHF / Nonischemic cardiomyopathy  - EF 20-25% with diffuse hypokinesis  - Hold lasix and digoxin; ACE discontinue due to acute renal failure. Dig. level in am - Low sodium diet  - Daily weight and strict intake and output. Unfortunately, in the last few days, patient is up 20 pounds. Once renal function normalized, will need to restart Lasix  Hyperosmolar non-ketotic state in patient with new diagnosis type 2 diabetes mellitus  - Cont Lantus and sliding scale. Pt refused carb modified diet. He relates he wants a regular diet. - Hemoglobin A1c 12.5  - Will need insulin at discharge. Have decrease insulin due to AKI. - Diabetes educator and nutrition service consulted  - CBGs better controlled past 24 hours after adjustment in insulin dose  - Patient more active and engage on injecting insulin by himself.  Left lower extremity numbness and tingling  - Suspect related to neuropathy in patient with newly diagnosed diabetes that is uncontrolled  - Continue blood sugar control  - cont neurontin   Hypokalemia -stable issue - Will replete and check Mg level.  Hypertension   blood pressure starting to trend up, likely from volume overload. Have added when necessary clonidine   Hyperkalemia Potassium has been trending up over the last few days. Not on supplemental potassium, as a function of worsening renal dysfunction. Kayexalate ordered today.  H/o MGUS- 2 yrs ago: - hematology has evaluated - recommending bone marrow bx in future.  - Previously saw [place]. Please review outpatient notes and advise for outpatient follow up.   Code Status: full Family  Communication: Left message with daughter Disposition Plan: inpatient   Consultants:  none  Procedures:  Ct abd  Echo  abd x-ray  Antibiotics:  vanc ans zosyn-course complete  HPI/Subjective: Doing ok, c/o generalized nausea, but breathing ok,  Some dyspnea on exertion.  Bowels moving  well.  Objective: Filed Vitals:   05/13/13 2304 05/14/13 0526 05/14/13 1143 05/14/13 1552  BP: 151/68 146/78 173/79 130/70  Pulse:  74 80 72  Temp:  98.2 F (36.8 C)  98.6 F (37 C)  TempSrc:  Oral  Oral  Resp:  22  22  Height:      Weight:  150.2 kg (331 lb 2.1 oz)    SpO2:  90% 98% 90%    Intake/Output Summary (Last 24 hours) at 05/14/13 1559 Last data filed at 05/14/13 1552  Gross per 24 hour  Intake    720 ml  Output   5050 ml  Net  -4330 ml   Filed Weights   05/12/13 0513 05/13/13 0500 05/14/13 0526  Weight: 145.4 kg (320 lb 8.8 oz) 148.961 kg (328 lb 6.4 oz) 150.2 kg (331 lb 2.1 oz)    Exam:  General: Alert, awake, oriented x3, in no acute distress.  HEENT: No bruits, no goiter.  Heart: Regular rate and rhythm, without murmurs, rubs, gallops.  Lungs: Good air movement, clear to auscultation-decreased from body habitus Abdomen: Soft, epigastric and left quadrant tenderness, nondistended, positive bowel sounds.  Extremities: No clubbing or cyanosis, trace edema bilaterally   Data Reviewed: Basic Metabolic Panel:  Recent Labs Lab 05/10/13 0324 05/11/13 1004 05/12/13 0643 05/13/13 0830 05/14/13 0744  NA 142 144 142 147 143  K 3.4* 4.2 4.4 5.0 5.3  CL 102 105 104 109 108  CO2 29 28 26 28 26   GLUCOSE 108* 117* 150* 169* 152*  BUN 25* 22 24* 22 20  CREATININE 2.96* 3.24* 3.32* 3.56* 3.22*  CALCIUM 8.4 7.9* 7.9* 8.1* 8.4   Liver Function Tests:  Recent Labs Lab 05/09/13 0418 05/10/13 0324 05/14/13 0744  AST 17 20 14   ALT 11 11 9   ALKPHOS 52 46 49  BILITOT 0.4 0.3 0.3  PROT 7.7 7.5 7.8  ALBUMIN 2.7* 2.6* 2.5*    Recent Labs Lab 05/09/13 0418 05/10/13 0324  LIPASE 471* 39   No results found for this basename: AMMONIA,  in the last 168 hours CBC:  Recent Labs Lab 05/09/13 0418 05/14/13 0744  WBC 4.3 3.5*  HGB 12.6* 11.0*  HCT 39.0 36.2*  MCV 88.4 91.9  PLT 161 134*   Cardiac Enzymes:  Recent Labs Lab 05/13/13 1930  CKTOTAL 35    BNP (last 3 results) No results found for this basename: PROBNP,  in the last 8760 hours CBG:  Recent Labs Lab 05/13/13 1643 05/13/13 2127 05/13/13 2348 05/14/13 0521 05/14/13 1137  GLUCAP 142* 137* 97 103* 162*    Recent Results (from the past 240 hour(s))  CLOSTRIDIUM DIFFICILE BY PCR     Status: None   Collection Time    05/06/13  6:06 PM      Result Value Ref Range Status   C difficile by pcr NEGATIVE  NEGATIVE Final     Studies: US Renal  05/14/2013   CLINICAL DATA:  Acute renal failure, history CHF, hypertension  EXAM: RENAL/URINARY TRACT ULTRASOUND COMPLETE  COMPARISON:  CT abdomen and pelvis 05/01/2013  FINDINGS: Image quality degraded secondary to body habitus.  Right Kidney:  Length: 13.4 cm. Normal cortical thickness and echogenicity. No mass,  hydronephrosis or shadowing calcification.  Left Kidney:  Length: 13.3 cm. Normal cortical thickness and echogenicity. No mass, hydronephrosis or shadowing calcification.  Bladder:  Appears normal for degree of bladder distention.  IMPRESSION: No focal renal sonographic abnormalities identified with limitations of exam as above.   Electronically Signed   By: Lavonia Dana M.D.   On: 05/14/2013 14:55    Scheduled Meds: . antiseptic oral rinse  15 mL Mouth Rinse BID  . aspirin EC  81 mg Oral Daily  . cloNIDine  0.1 mg Oral BID  . enoxaparin (LOVENOX) injection  40 mg Subcutaneous Q24H  . folic acid  1 mg Oral Daily  . gabapentin  100 mg Oral TID  . insulin aspart  0-5 Units Subcutaneous QHS  . insulin aspart  0-9 Units Subcutaneous TID WC  . lipase/protease/amylase  2 capsule Oral TID WC  . metoprolol tartrate  12.5 mg Oral BID  . multivitamin  5 mL Oral Daily  . pantoprazole  40 mg Oral BID  . pneumococcal 23 valent vaccine  0.5 mL Intramuscular Tomorrow-1000  . sodium polystyrene  15 g Oral Once  . thiamine  100 mg Oral Daily   Continuous Infusions:   Time spent: 20 minutes  [place]  [facility] Pager [phone]. If 8PM-8AM, please contact night-coverage at [email or web address], password [facility] 05/14/2013, 3:59 PM  LOS: 14 days

## 2013-05-15 DIAGNOSIS — I5043 Acute on chronic combined systolic (congestive) and diastolic (congestive) heart failure: Secondary | ICD-10-CM

## 2013-05-15 LAB — RENAL FUNCTION PANEL
Albumin: 2.5 g/dL — ABNORMAL LOW (ref 3.5–5.2)
BUN: 19 mg/dL (ref 6–23)
CO2: 28 meq/L (ref 19–32)
Calcium: 8.5 mg/dL (ref 8.4–10.5)
Chloride: 107 mEq/L (ref 96–112)
Creatinine, Ser: 3.13 mg/dL — ABNORMAL HIGH (ref 0.50–1.35)
GFR calc Af Amer: 24 mL/min — ABNORMAL LOW (ref >90)
GFR calc non Af Amer: 20 mL/min — ABNORMAL LOW (ref >90)
GLUCOSE: 136 mg/dL — AB (ref 70–99)
POTASSIUM: 4.7 meq/L (ref 3.7–5.3)
Phosphorus: 3.6 mg/dL (ref 2.3–4.6)
Sodium: 143 mEq/L (ref 137–147)

## 2013-05-15 LAB — GLUCOSE, CAPILLARY
GLUCOSE-CAPILLARY: 106 mg/dL — AB (ref 70–99)
GLUCOSE-CAPILLARY: 134 mg/dL — AB (ref 70–99)
GLUCOSE-CAPILLARY: 212 mg/dL — AB (ref 70–99)
Glucose-Capillary: 155 mg/dL — ABNORMAL HIGH (ref 70–99)
Glucose-Capillary: 159 mg/dL — ABNORMAL HIGH (ref 70–99)

## 2013-05-15 LAB — CBC
HCT: 35.1 % — ABNORMAL LOW (ref 39.0–52.0)
Hemoglobin: 10.9 g/dL — ABNORMAL LOW (ref 13.0–17.0)
MCH: 27.9 pg (ref 26.0–34.0)
MCHC: 31.1 g/dL (ref 30.0–36.0)
MCV: 90 fL (ref 78.0–100.0)
PLATELETS: 151 10*3/uL (ref 150–400)
RBC: 3.9 MIL/uL — ABNORMAL LOW (ref 4.22–5.81)
RDW: 16 % — AB (ref 11.5–15.5)
WBC: 3.3 10*3/uL — ABNORMAL LOW (ref 4.0–10.5)

## 2013-05-15 LAB — MPO/PR-3 (ANCA) ANTIBODIES: Myeloperoxidase Abs: <1

## 2013-05-15 LAB — ANA: Anti Nuclear Antibody(ANA): NEGATIVE

## 2013-05-15 LAB — ANTI-DNA ANTIBODY, DOUBLE-STRANDED: ds DNA Ab: 3 IU/mL

## 2013-05-15 MED ORDER — DIGOXIN 125 MCG PO TABS
0.1250 mg | ORAL_TABLET | Freq: Every day | ORAL | Status: DC
  Administered 2013-05-15 – 2013-05-16 (×2): 0.125 mg via ORAL
  Filled 2013-05-15 (×3): qty 1

## 2013-05-15 MED ORDER — HYDRALAZINE HCL 10 MG PO TABS
10.0000 mg | ORAL_TABLET | Freq: Four times a day (QID) | ORAL | Status: DC | PRN
  Filled 2013-05-15: qty 1

## 2013-05-15 NOTE — Progress Notes (Signed)
[facility] ROUNDING NOTE   Subjective:   Interval History:no complaints Objective:  Vital signs in last 24 hours:  Temp:  [98.4 F (36.9 C)-98.6 F (37 C)] 98.5 F (36.9 C) (02/23 0647) Pulse Rate:  [67-92] 67 (02/23 0647) Resp:  [20-22] 20 (02/23 0647) BP: (130-144)/(70-85) 133/85 mmHg (02/23 0647) SpO2:  [90 %-93 %] 90 % (02/23 0647) Weight:  [147.011 kg (324 lb 1.6 oz)] 147.011 kg (324 lb 1.6 oz) (02/23 0647)  Weight change: -3.189 kg (-7 lb 0.5 oz) Filed Weights   05/13/13 0500 05/14/13 0526 05/15/13 0647  Weight: 148.961 kg (328 lb 6.4 oz) 150.2 kg (331 lb 2.1 oz) 147.011 kg (324 lb 1.6 oz)    Intake/Output: I/O last 3 completed shifts: In: 1440 [P.O.:1440] Out: 7025 [Urine:7025]   Intake/Output this shift:  Total I/O In: 240 [P.O.:240] Out: 500 [Urine:500]  General: Alert, awake, oriented x3, in no acute distress.  HEENT: No bruits,  Heart: RRR Lungs: decreased at bases  Abdomen: Soft, epigastric and left quadrant tenderness, nondistended, positive bowel sounds.  Extremities: No clubbing or cyanosis, trace edema bilaterally    Basic Metabolic Panel:  Recent Labs Lab 05/11/13 1004 05/12/13 0643 05/13/13 0830 05/14/13 0744 05/15/13 0718  NA 144 142 147 143 143  K 4.2 4.4 5.0 5.3 4.7  CL 105 104 109 108 107  CO2 '28 26 28 26 28  ' GLUCOSE 117* 150* 169* 152* 136*  BUN 22 24* '22 20 19  ' CREATININE 3.24* 3.32* 3.56* 3.22* 3.13*  CALCIUM 7.9* 7.9* 8.1* 8.4 8.5  PHOS  --   --   --   --  3.6    Liver Function Tests:  Recent Labs Lab 05/09/13 0418 05/10/13 0324 05/14/13 0744 05/15/13 0718  AST '17 20 14  ' --   ALT '11 11 9  ' --   ALKPHOS 52 46 49  --   BILITOT 0.4 0.3 0.3  --   PROT 7.7 7.5 7.8  --   ALBUMIN 2.7* 2.6* 2.5* 2.5*    Recent Labs Lab 05/09/13 0418 05/10/13 0324  LIPASE 471* 39   No results found for this basename: AMMONIA,  in the last 168 hours  CBC:  Recent Labs Lab 05/09/13 0418 05/14/13 0744  05/15/13 0718  WBC 4.3 3.5* 3.3*  HGB 12.6* 11.0* 10.9*  HCT 39.0 36.2* 35.1*  MCV 88.4 91.9 90.0  PLT 161 134* 151    Cardiac Enzymes:  Recent Labs Lab 05/13/13 1930  CKTOTAL 35    BNP: No components found with this basename: POCBNP,   CBG:  Recent Labs Lab 05/14/13 1636 05/14/13 2136 05/15/13 0143 05/15/13 0644 05/15/13 1057  GLUCAP 110* 237* 106* 134* 155*    Microbiology: Results for orders placed during the hospital encounter of 04/30/13  CULTURE, BLOOD (ROUTINE X 2)     Status: None   Collection Time    04/30/13 11:37 PM      Result Value Ref Range Status   Specimen Description BLOOD RIGHT HAND   Final   Special Requests BOTTLES DRAWN AEROBIC ONLY [facility]   Final   Culture  Setup Time     Final   Value: 05/01/2013 08:20     Performed at [facility]   Culture     Final   Value: NO GROWTH 5 DAYS     Performed at [facility]   Report Status 05/07/2013 FINAL   Final  CULTURE, BLOOD (ROUTINE X 2)     Status: None  Collection Time    04/30/13 11:47 PM      Result Value Ref Range Status   Specimen Description BLOOD LEFT HAND   Final   Special Requests BOTTLES DRAWN AEROBIC AND ANAEROBIC [facility] EACH   Final   Culture  Setup Time     Final   Value: 05/01/2013 08:20     Performed at [facility]   Culture     Final   Value: NO GROWTH 5 DAYS     Performed at [facility]   Report Status 05/07/2013 FINAL   Final  MRSA PCR SCREENING     Status: None   Collection Time    05/01/13  6:16 AM      Result Value Ref Range Status   MRSA by PCR NEGATIVE  NEGATIVE Final   Comment:            The GeneXpert MRSA Assay (FDA     approved for NASAL specimens     only), is one component of a     comprehensive MRSA colonization     surveillance program. It is not     intended to diagnose MRSA     infection nor to guide or     monitor treatment for     MRSA infections.  URINE CULTURE     Status: None   Collection Time    05/03/13 11:40  AM      Result Value Ref Range Status   Specimen Description URINE, CATHETERIZED   Final   Special Requests NONE   Final   Culture  Setup Time     Final   Value: 05/03/2013 12:30     Performed at [facility]     Final   Value: NO GROWTH     Performed at [facility]   Culture     Final   Value: NO GROWTH     Performed at [facility]   Report Status 05/04/2013 FINAL   Final  RESPIRATORY VIRUS PANEL     Status: None   Collection Time    05/03/13  1:51 PM      Result Value Ref Range Status   Source - RVPAN TRACHEAL ASPIRATE   Final   Respiratory Syncytial Virus A NOT DETECTED   Final   Respiratory Syncytial Virus B NOT DETECTED   Final   Influenza A NOT DETECTED   Final   Influenza B NOT DETECTED   Final   Parainfluenza 1 NOT DETECTED   Final   Parainfluenza 2 NOT DETECTED   Final   Parainfluenza 3 NOT DETECTED   Final   Metapneumovirus NOT DETECTED   Final   Rhinovirus NOT DETECTED   Final   Adenovirus NOT DETECTED   Final   Influenza A H1 NOT DETECTED   Final   Influenza A H3 NOT DETECTED   Final   Comment: (NOTE)           Normal Reference Range for each Analyte: NOT DETECTED     Testing performed using the Luminex xTAG Respiratory Viral Panel test     kit.     This test was developed and its performance characteristics determined     by [facility]. It has not been cleared or approved by the [place]     Food and Drug Administration. This test is used for clinical purposes.     It should not be regarded as investigational or  for research. This     laboratory is certified under the [place] (CLIA) as qualified to perform high complexity     clinical laboratory testing.     Performed at [facility] DIFFICILE BY PCR     Status: None   Collection Time    05/06/13  6:06 PM      Result Value Ref Range Status   C difficile by pcr NEGATIVE  NEGATIVE Final     Coagulation Studies: No results found for this basename: LABPROT, INR,  in the last 72 hours  Urinalysis:  Recent Labs  05/13/13 2143  [place] 1.008  PHURINE 6.0  [place] NEGATIVE  UROBILINOGEN 0.2  NITRITE NEGATIVE  LEUKOCYTESUR NEGATIVE      Imaging: US Renal  05/14/2013   CLINICAL DATA:  Acute renal failure, history CHF, hypertension  EXAM: RENAL/URINARY TRACT ULTRASOUND COMPLETE  COMPARISON:  CT abdomen and pelvis 05/01/2013  FINDINGS: Image quality degraded secondary to body habitus.  Right Kidney:  Length: 13.4 cm. Normal cortical thickness and echogenicity. No mass, hydronephrosis or shadowing calcification.  Left Kidney:  Length: 13.3 cm. Normal cortical thickness and echogenicity. No mass, hydronephrosis or shadowing calcification.  Bladder:  Appears normal for degree of bladder distention.  IMPRESSION: No focal renal sonographic abnormalities identified with limitations of exam as above.   Electronically Signed   By: Lavonia Dana M.D.   On: 05/14/2013 14:55     Medications:     . antiseptic oral rinse  15 mL Mouth Rinse BID  . aspirin EC  81 mg Oral Daily  . enoxaparin (LOVENOX) injection  75 mg Subcutaneous Q24H  . folic acid  1 mg Oral Daily  . gabapentin  100 mg Oral TID  . insulin aspart  0-5 Units Subcutaneous QHS  . insulin aspart  0-9 Units Subcutaneous TID WC  . lipase/protease/amylase  2 capsule Oral TID WC  . metoprolol tartrate  12.5 mg Oral BID  . multivitamin  5 mL Oral Daily  . pantoprazole  40 mg Oral BID  . pneumococcal 23 valent vaccine  0.5 mL Intramuscular Tomorrow-1000  . sodium polystyrene  15 g Oral Once  . thiamine  100 mg Oral Daily   acetaminophen (TYLENOL) oral liquid 160 mg/5 mL, cloNIDine, HYDROcodone-acetaminophen, ipratropium-albuterol, ondansetron (ZOFRAN) IV, ondansetron (ZOFRAN) IV, oxyCODONE, zolpidem  Assessment/ Plan:  58  y.o. male who presented on 04/30/2013 with a past medical history of Chronic Pancreatitis; Atrial fibrillation; CHF; Morbid obesity; Chronic pain; Narcotic dependence; Respiratory failure; History of renal failure; History of ventral hernia repair; Cervical compression fracture; Hypertension; Hyperlipidemia; Degenerative joint disease; and Heart murmur who c/o altered mental status. Per history obtained from family, he was drinking excess amounts of fluid and V8 and urinating a lot for several days prior to admission. He was found on the floor by his family in the bathroom. He was noted to have a fever of 102 and a glucose of 900 with lactic acidosis  1.   AKI, probably ATN volume depletion and contrast. Renal U/S negative for hydronephrosis. Patient also received VancomycinSerologies ANA and hepatitis are negative so far  Renal function is stable    2. HTN volume controlled, diuresing well  3. Anemia stable  4. Chronic pancreatitis  5. Atrial fibrillation  History of thrombocytopenia   LOS: 15 Don Brown W '@TODAY' '@12' :57 PM

## 2013-05-15 NOTE — Progress Notes (Signed)
I attempted to see Don Brown and discuss his HF diagnosis.  He first said he was unaware that he had heart failure and then said that he was about to get disability related to it.  He did not engage in conversation and then took a phone call as I was speaking.  I will attempt to see him again later for education.  Carole Binning RN, BSN, PCCN---Heart Failure Art therapist

## 2013-05-15 NOTE — Progress Notes (Signed)
Patient rested quietly last night except for one instance of nausea. Unable to administer IV Zofran due to patient's refusal of IV restart.

## 2013-05-15 NOTE — Progress Notes (Signed)
Physical Therapy Treatment Patient Details Name: Don Brown MRN: [id number] DOB: 31-Mar-1955 Today's Date: 05/15/2013 Time: 1207-1225 PT Time Calculation (min): 18 min  PT Assessment / Plan / Recommendation  History of Present Illness pt admitted for pancreatitis, type 2 diabetes   PT Comments   Patient declining OOB with PT today.  Did agree with exercises.  Follow Up Recommendations  Home health PT     Does the patient have the potential to tolerate intense rehabilitation     Barriers to Discharge        Equipment Recommendations  Rolling walker with 5" wheels (bariatric)    Recommendations for Other Services    Frequency Min 3X/week   Progress towards PT Goals Progress towards PT goals: Not progressing toward goals - comment (declined OOB with PT)  Plan Current plan remains appropriate    Precautions / Restrictions Precautions Precautions: Fall Restrictions Weight Bearing Restrictions: No   Pertinent Vitals/Pain     Mobility       Exercises General Exercises - Lower Extremity Ankle Circles/Pumps: AROM;Both;10 reps;Supine Quad Sets: AROM;Both;10 reps;Supine Gluteal Sets: AROM;Both;10 reps;Supine Hip ABduction/ADduction: AROM;Both;10 reps;Supine     PT Goals (current goals can now be found in the care plan section)    Visit Information  Last PT Received On: 05/15/13 Assistance Needed: +1 History of Present Illness: pt admitted for pancreatitis, type 2 diabetes    Subjective Data  Subjective: "I've been moving."  Patient agreed to perform exercises.  At end of session, patient asking "Why are my legs so weak?"   Cognition  Cognition Arousal/Alertness: Awake/alert Behavior During Therapy: Impulsive Overall Cognitive Status: Within Functional Limits for tasks assessed    Balance     End of Session PT - End of Session Activity Tolerance: Patient limited by fatigue Patient left: in bed;with call bell/phone within reach (Call bell not functioning  properly.  Facilities called)   GP     [name] 05/15/2013, 3:44 PM [name]. Sanjuana Kava, [place] Pager [phone]

## 2013-05-15 NOTE — Progress Notes (Signed)
[facility] PROGRESS NOTE Interim History: 58 y.o. male who presented on 04/30/2013 with a past medical history of Chronic Pancreatitis; Atrial fibrillation; CHF; Morbid obesity; Chronic pain; Narcotic dependence; Respiratory failure; History of renal failure; History of ventral hernia repair; Cervical compression fracture; Hypertension; Hyperlipidemia; Degenerative joint disease; and Heart murmur who c/o altered mental status. Per history obtained from family, he was drinking excess amounts of fluid and V8 and urinating a lot for several days prior to admission. He was found on the floor by his family in the bathroom. He was noted to have a fever of 102 and a glucose of 900 with lactic acidosis  Assessment/Plan: Fever - Acute respiratory failure with hypoxia  - Source not confirmed but suspected to be pneumonia - ID has been following  - ID recommended last dose vancomycin and zosyn on 2/16  - Off antibiotics now  - WBC's WNL and no fever   Acute on Chronic pancreatitis  - Continue pancreatic enzymes.   - tolerate diet, change narcotics to orals. Does not want oral narcotic relates causes him to be nauseated yet he has been tolerating his meals.He want IV dilaudid. When my partner refused, the patient became belligerent and fired him. When a second hospitalist saw the patient and refused narcotics, he too was fired. Pain seem sto be doing better & pt happy to leave IV out - Continue PRN antiemetics and pain meds orally.  AKI: - Cr starting to trend. Appreciate nephrology help. Felt to be mostly from vancomycin. - Multifactorial due to Vanc., ACE-I and pre-renal - Cont IV fluids Fena <1%. Will be carefull with IV fluids as he does have EF 20%. He is now becoming positive in regards to fluid balance. - Cont to hold lasix. Consulted renal. For Kayexalate today, which may worsen his function. Also, with renal function normal will really need to restart Lasix.  Atrial fibrillation  - Rate  controlled  - anticoag has not been intitiated due to chronic thrombocytopenia.   CHF / Nonischemic cardiomyopathy  - EF 20-25% with diffuse hypokinesis  - Hold lasix and digoxin; ACE discontinue due to acute renal failure. Dig. level in am - Low sodium diet  - Daily weight and strict intake and output. Unfortunately, in the last few days, patient is up 20 pounds. Once renal function normalized, will need to restart Lasix.  Down a few pounds today.  Hyperosmolar non-ketotic state in patient with new diagnosis type 2 diabetes mellitus  - Cont Lantus and sliding scale. Pt refused carb modified diet. He relates he wants a regular diet. - Hemoglobin A1c 12.5  - Will need insulin at discharge. Have decrease insulin due to AKI. - Diabetes educator and nutrition service consulted  - CBGs better controlled past 24 hours after adjustment in insulin dose  - Patient more active and engage on injecting insulin by himself.  Left lower extremity numbness and tingling  - Suspect related to neuropathy in patient with newly diagnosed diabetes that is uncontrolled  - Continue blood sugar control  - cont neurontin   Hypokalemia -stable issue - Will replete and check Mg level.  Hypertension   blood pressure starting to trend up, likely from volume overload. Have added when necessary clonidine   Hyperkalemia Potassium has been trending up over the last few days. Not on supplemental potassium, as a function of worsening renal dysfunction. Kayexalate ordered today.  H/o MGUS- 2 yrs ago: - hematology has evaluated - recommending bone marrow bx in future.  -  Previously saw [place]. Please review outpatient notes and advise for outpatient follow up.   Code Status: full Family Communication: Left message with daughter Disposition Plan: Home in a few more days    Consultants:  none  Procedures:  Ct abd  Echo  abd x-ray  Antibiotics:  vanc ans zosyn-course complete  HPI/Subjective: Doing  ok, c/o generalized nausea, but breathing ok,  Some dyspnea on exertion.  Tired.  Bowels moving well.  Objective: Filed Vitals:   05/14/13 1143 05/14/13 1552 05/14/13 2053 05/15/13 0647  BP: 173/79 130/70 144/75 133/85  Pulse: 80 72 92 67  Temp:  98.6 F (37 C) 98.4 F (36.9 C) 98.5 F (36.9 C)  TempSrc:  Oral Oral Oral  Resp:  22 20 20   Height:      Weight:    147.011 kg (324 lb 1.6 oz)  SpO2: 98% 90% 93% 90%    Intake/Output Summary (Last 24 hours) at 05/15/13 1331 Last data filed at 05/15/13 1058  Gross per 24 hour  Intake   1200 ml  Output   3400 ml  Net  -2200 ml   Filed Weights   05/13/13 0500 05/14/13 0526 05/15/13 0647  Weight: 148.961 kg (328 lb 6.4 oz) 150.2 kg (331 lb 2.1 oz) 147.011 kg (324 lb 1.6 oz)    Exam:  General: Alert, awake, oriented x3, in no acute distress.  HEENT: No bruits, no goiter.  Heart: Regular rate and rhythm, without murmurs, rubs, gallops.  Lungs: Good air movement, clear to auscultation-decreased from body habitus Abdomen: Soft, epigastric and left quadrant tenderness, nondistended, positive bowel sounds.  Extremities: No clubbing or cyanosis, trace edema bilaterally   Data Reviewed: Basic Metabolic Panel:  Recent Labs Lab 05/11/13 1004 05/12/13 0643 05/13/13 0830 05/14/13 0744 05/15/13 0718  NA 144 142 147 143 143  K 4.2 4.4 5.0 5.3 4.7  CL 105 104 109 108 107  CO2 28 26 28 26 28   GLUCOSE 117* 150* 169* 152* 136*  BUN 22 24* 22 20 19   CREATININE 3.24* 3.32* 3.56* 3.22* 3.13*  CALCIUM 7.9* 7.9* 8.1* 8.4 8.5  PHOS  --   --   --   --  3.6   Liver Function Tests:  Recent Labs Lab 05/09/13 0418 05/10/13 0324 05/14/13 0744 05/15/13 0718  AST 17 20 14   --   ALT 11 11 9   --   ALKPHOS 52 46 49  --   BILITOT 0.4 0.3 0.3  --   PROT 7.7 7.5 7.8  --   ALBUMIN 2.7* 2.6* 2.5* 2.5*    Recent Labs Lab 05/09/13 0418 05/10/13 0324  LIPASE 471* 39   No results found for this basename: AMMONIA,  in the last 168  hours CBC:  Recent Labs Lab 05/09/13 0418 05/14/13 0744 05/15/13 0718  WBC 4.3 3.5* 3.3*  HGB 12.6* 11.0* 10.9*  HCT 39.0 36.2* 35.1*  MCV 88.4 91.9 90.0  PLT 161 134* 151   Cardiac Enzymes:  Recent Labs Lab 05/13/13 1930  CKTOTAL 35   BNP (last 3 results) No results found for this basename: PROBNP,  in the last 8760 hours CBG:  Recent Labs Lab 05/14/13 1636 05/14/13 2136 05/15/13 0143 05/15/13 0644 05/15/13 1057  GLUCAP 110* 237* 106* 134* 155*    Recent Results (from the past 240 hour(s))  CLOSTRIDIUM DIFFICILE BY PCR     Status: None   Collection Time    05/06/13  6:06 PM      Result Value Ref  Range Status   C difficile by pcr NEGATIVE  NEGATIVE Final     Studies: US Renal  05/14/2013   CLINICAL DATA:  Acute renal failure, history CHF, hypertension  EXAM: RENAL/URINARY TRACT ULTRASOUND COMPLETE  COMPARISON:  CT abdomen and pelvis 05/01/2013  FINDINGS: Image quality degraded secondary to body habitus.  Right Kidney:  Length: 13.4 cm. Normal cortical thickness and echogenicity. No mass, hydronephrosis or shadowing calcification.  Left Kidney:  Length: 13.3 cm. Normal cortical thickness and echogenicity. No mass, hydronephrosis or shadowing calcification.  Bladder:  Appears normal for degree of bladder distention.  IMPRESSION: No focal renal sonographic abnormalities identified with limitations of exam as above.   Electronically Signed   By: Lavonia Dana M.D.   On: 05/14/2013 14:55    Scheduled Meds: . antiseptic oral rinse  15 mL Mouth Rinse BID  . aspirin EC  81 mg Oral Daily  . enoxaparin (LOVENOX) injection  75 mg Subcutaneous Q24H  . folic acid  1 mg Oral Daily  . gabapentin  100 mg Oral TID  . insulin aspart  0-5 Units Subcutaneous QHS  . insulin aspart  0-9 Units Subcutaneous TID WC  . lipase/protease/amylase  2 capsule Oral TID WC  . metoprolol tartrate  12.5 mg Oral BID  . multivitamin  5 mL Oral Daily  . pantoprazole  40 mg Oral BID  . pneumococcal  23 valent vaccine  0.5 mL Intramuscular Tomorrow-1000  . sodium polystyrene  15 g Oral Once  . thiamine  100 mg Oral Daily   Continuous Infusions:   Time spent: 20 minutes  [facility] Pager [phone]. If 8PM-8AM, please contact night-coverage at [email or web address], password [facility] 05/15/2013, 1:31 PM  LOS: 15 days

## 2013-05-15 NOTE — Progress Notes (Signed)
NUTRITION FOLLOW UP  Intervention:    Provided carbohydrate counting diet education  No further interventions needed   Nutrition Dx:   Inadequate oral intake related to inability to eat as evidenced by NPO status; discontinued, diet advanced with meal completion 100%  New Goal:   Pt to meet >/= 90% of their estimated nutrition needs; being met  Monitor:   PO intake, weight trend, labs  Assessment:   58 yo M with known chronic pancreatitis, AFib, HF, obesity, HTN, HLD, coming in with acute agitation and fever, has HHS + elevated lactate. Concern for possible DT vs intraabdominal process (pancreatitis vs infection vs bowel ischemia).   2/17: Pt extubated 2/13 and diet advanced to heart healthy diet 2/15. Pt now on clear liquid diet again due to nausea and abdominal discomfort with pancreatitis. Encouraged pt to avoid high fat foods. Pt newly diagnosed with diabetes. Provided diabetes diet education. Pt is consuming 100% of meal trays at this time. No new weight. Labs noted.  2/23: Per MD note, pt refused the carb modified diet and he is now on a regular diet. Per nursing notes pt is now eating 100% of most meals. Pt's weight is currently 8 lbs above admission weight. Pt states his appetite is normal and he is eating well. He denies any pain or nausea after eating. He has no further questions or concerns regarding diabetic diet education given last week.  Labs: Low GFR, high creatinine, low hemoglobin, CBG range 97 to 237 mg/dL  Height: Ht Readings from Last 1 Encounters:  05/07/13 6' (1.829 m)    Weight Status:   Wt Readings from Last 1 Encounters:  05/15/13 324 lb 1.6 oz (147.011 kg)    Re-estimated needs:  Kcal: 2200-2500 Protein: 140-150 gm Fluid: 2.5 L daily  Skin: no wounds; non-pitting RLE and LLE edema  Diet Order: General   Intake/Output Summary (Last 24 hours) at 05/15/13 1010 Last data filed at 05/15/13 0818  Gross per 24 hour  Intake   1200 ml  Output   3725  ml  Net  -2525 ml    Last BM: 2/20   Labs:   Recent Labs Lab 05/13/13 0830 05/14/13 0744 05/15/13 0718  NA 147 143 143  K 5.0 5.3 4.7  CL 109 108 107  CO2 $Re'28 26 28  'oLN$ BUN $R'22 20 19  'Ul$ CREATININE 3.56* 3.22* 3.13*  CALCIUM 8.1* 8.4 8.5  PHOS  --   --  3.6  GLUCOSE 169* 152* 136*    CBG (last 3)   Recent Labs  05/14/13 2136 05/15/13 0143 05/15/13 0644  GLUCAP 237* 106* 134*    Scheduled Meds: . antiseptic oral rinse  15 mL Mouth Rinse BID  . aspirin EC  81 mg Oral Daily  . enoxaparin (LOVENOX) injection  75 mg Subcutaneous Q24H  . folic acid  1 mg Oral Daily  . gabapentin  100 mg Oral TID  . insulin aspart  0-5 Units Subcutaneous QHS  . insulin aspart  0-9 Units Subcutaneous TID WC  . lipase/protease/amylase  2 capsule Oral TID WC  . metoprolol tartrate  12.5 mg Oral BID  . multivitamin  5 mL Oral Daily  . pantoprazole  40 mg Oral BID  . pneumococcal 23 valent vaccine  0.5 mL Intramuscular Tomorrow-1000  . sodium polystyrene  15 g Oral Once  . thiamine  100 mg Oral Daily    Continuous Infusions:   Pryor Ochoa RD, LDN Inpatient Clinical Dietitian Pager: [phone] After Hours Pager:  [phone]    

## 2013-05-16 LAB — RENAL FUNCTION PANEL
Albumin: 2.7 g/dL — ABNORMAL LOW (ref 3.5–5.2)
BUN: 18 mg/dL (ref 6–23)
CALCIUM: 8.7 mg/dL (ref 8.4–10.5)
CO2: 26 mEq/L (ref 19–32)
CREATININE: 2.87 mg/dL — AB (ref 0.50–1.35)
Chloride: 105 mEq/L (ref 96–112)
GFR calc Af Amer: 26 mL/min — ABNORMAL LOW (ref >90)
GFR calc non Af Amer: 23 mL/min — ABNORMAL LOW (ref >90)
GLUCOSE: 138 mg/dL — AB (ref 70–99)
PHOSPHORUS: 3.3 mg/dL (ref 2.3–4.6)
Potassium: 4.8 mEq/L (ref 3.7–5.3)
Sodium: 143 mEq/L (ref 137–147)

## 2013-05-16 LAB — GLUCOSE, CAPILLARY
Glucose-Capillary: 124 mg/dL — ABNORMAL HIGH (ref 70–99)
Glucose-Capillary: 125 mg/dL — ABNORMAL HIGH (ref 70–99)
Glucose-Capillary: 174 mg/dL — ABNORMAL HIGH (ref 70–99)
Glucose-Capillary: 137 mg/dL — ABNORMAL HIGH (ref 70–99)
Glucose-Capillary: 138 mg/dL — ABNORMAL HIGH (ref 70–99)

## 2013-05-16 LAB — COMPLEMENT, TOTAL: Compl, Total (CH50): >60 U/mL — ABNORMAL HIGH (ref 31–60)
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
## 2013-05-16 NOTE — Progress Notes (Signed)
[facility] ROUNDING NOTE   Subjective:   Interval History: no complaints   Objective:  Vital signs in last 24 hours:  Temp:  [97 F (36.1 C)-98.6 F (37 C)] 97.6 F (36.4 C) (02/24 0606) Pulse Rate:  [63-72] 72 (02/24 1032) Resp:  [20] 20 (02/24 0606) BP: (122-156)/(65-91) 122/74 mmHg (02/24 1032) SpO2:  [97 %-98 %] 97 % (02/24 1032) Weight:  [148.871 kg (328 lb 3.2 oz)] 148.871 kg (328 lb 3.2 oz) (02/24 0606)  Weight change: 1.86 kg (4 lb 1.6 oz) Filed Weights   05/14/13 0526 05/15/13 0647 05/16/13 0606  Weight: 150.2 kg (331 lb 2.1 oz) 147.011 kg (324 lb 1.6 oz) 148.871 kg (328 lb 3.2 oz)    Intake/Output: I/O last 3 completed shifts: In: 1640 [P.O.:1640] Out: 5366 [Urine:5225]   Intake/Output this shift:  Total I/O In: 240 [P.O.:240] Out: 425 [Urine:425]  General: Alert, awake, oriented x3, in no acute distress.  HEENT: No bruits,  Heart: RRR  Lungs: decreased at bases  Abdomen: Soft, epigastric and left quadrant tenderness, nondistended, positive bowel sounds.  Extremities: No clubbing or cyanosis, trace edema bilaterally    Basic Metabolic Panel:  Recent Labs Lab 05/11/13 1004 05/12/13 0643 05/13/13 0830 05/14/13 0744 05/15/13 0718  NA 144 142 147 143 143  K 4.2 4.4 5.0 5.3 4.7  CL 105 104 109 108 107  CO2 '28 26 28 26 28  ' GLUCOSE 117* 150* 169* 152* 136*  BUN 22 24* '22 20 19  ' CREATININE 3.24* 3.32* 3.56* 3.22* 3.13*  CALCIUM 7.9* 7.9* 8.1* 8.4 8.5  PHOS  --   --   --   --  3.6    Liver Function Tests:  Recent Labs Lab 05/10/13 0324 05/14/13 0744 05/15/13 0718  AST 20 14  --   ALT 11 9  --   ALKPHOS 46 49  --   BILITOT 0.3 0.3  --   PROT 7.5 7.8  --   ALBUMIN 2.6* 2.5* 2.5*    Recent Labs Lab 05/10/13 0324  LIPASE 39   No results found for this basename: AMMONIA,  in the last 168 hours  CBC:  Recent Labs Lab 05/14/13 0744 05/15/13 0718  WBC 3.5* 3.3*  HGB 11.0* 10.9*  HCT 36.2* 35.1*  MCV 91.9 90.0   PLT 134* 151    Cardiac Enzymes:  Recent Labs Lab 05/13/13 1930  CKTOTAL 35    BNP: No components found with this basename: POCBNP,   CBG:  Recent Labs Lab 05/15/13 1057 05/15/13 1619 05/15/13 2203 05/16/13 0718 05/16/13 1154  GLUCAP 155* 212* 159* 124* 125*    Microbiology: Results for orders placed during the hospital encounter of 04/30/13  CULTURE, BLOOD (ROUTINE X 2)     Status: None   Collection Time    04/30/13 11:37 PM      Result Value Ref Range Status   Specimen Description BLOOD RIGHT HAND   Final   Special Requests BOTTLES DRAWN AEROBIC ONLY [facility]   Final   Culture  Setup Time     Final   Value: 05/01/2013 08:20     Performed at [facility]   Culture     Final   Value: NO GROWTH 5 DAYS     Performed at [facility]   Report Status 05/07/2013 FINAL   Final  CULTURE, BLOOD (ROUTINE X 2)     Status: None   Collection Time    04/30/13 11:47 PM  Result Value Ref Range Status   Specimen Description BLOOD LEFT HAND   Final   Special Requests BOTTLES DRAWN AEROBIC AND ANAEROBIC [facility] EACH   Final   Culture  Setup Time     Final   Value: 05/01/2013 08:20     Performed at [facility]   Culture     Final   Value: NO GROWTH 5 DAYS     Performed at [facility]   Report Status 05/07/2013 FINAL   Final  MRSA PCR SCREENING     Status: None   Collection Time    05/01/13  6:16 AM      Result Value Ref Range Status   MRSA by PCR NEGATIVE  NEGATIVE Final   Comment:            The GeneXpert MRSA Assay (FDA     approved for NASAL specimens     only), is one component of a     comprehensive MRSA colonization     surveillance program. It is not     intended to diagnose MRSA     infection nor to guide or     monitor treatment for     MRSA infections.  URINE CULTURE     Status: None   Collection Time    05/03/13 11:40 AM      Result Value Ref Range Status   Specimen Description URINE, CATHETERIZED   Final   Special  Requests NONE   Final   Culture  Setup Time     Final   Value: 05/03/2013 12:30     Performed at [facility]     Final   Value: NO GROWTH     Performed at [facility]   Culture     Final   Value: NO GROWTH     Performed at [facility]   Report Status 05/04/2013 FINAL   Final  RESPIRATORY VIRUS PANEL     Status: None   Collection Time    05/03/13  1:51 PM      Result Value Ref Range Status   Source - RVPAN TRACHEAL ASPIRATE   Final   Respiratory Syncytial Virus A NOT DETECTED   Final   Respiratory Syncytial Virus B NOT DETECTED   Final   Influenza A NOT DETECTED   Final   Influenza B NOT DETECTED   Final   Parainfluenza 1 NOT DETECTED   Final   Parainfluenza 2 NOT DETECTED   Final   Parainfluenza 3 NOT DETECTED   Final   Metapneumovirus NOT DETECTED   Final   Rhinovirus NOT DETECTED   Final   Adenovirus NOT DETECTED   Final   Influenza A H1 NOT DETECTED   Final   Influenza A H3 NOT DETECTED   Final   Comment: (NOTE)           Normal Reference Range for each Analyte: NOT DETECTED     Testing performed using the Luminex xTAG Respiratory Viral Panel test     kit.     This test was developed and its performance characteristics determined     by [facility]. It has not been cleared or approved by the [place]     Food and Drug Administration. This test is used for clinical purposes.     It should not be regarded as investigational or for research. This     laboratory is certified under the [place]  Laboratory Improvement     Amendments of 1988 (CLIA) as qualified to perform high complexity     clinical laboratory testing.     Performed at [facility] DIFFICILE BY PCR     Status: None   Collection Time    05/06/13  6:06 PM      Result Value Ref Range Status   C difficile by pcr NEGATIVE  NEGATIVE Final    Coagulation Studies: No results found for this basename: LABPROT, INR,  in the last 72  hours  Urinalysis:  Recent Labs  05/13/13 2143  COLORURINE STRAW*  LABSPEC 1.008  PHURINE 6.0  GLUCOSEU NEGATIVE  HGBUR LARGE*  BILIRUBINUR NEGATIVE  KETONESUR NEGATIVE  PROTEINUR NEGATIVE  UROBILINOGEN 0.2  NITRITE NEGATIVE  LEUKOCYTESUR NEGATIVE      Imaging: No results found.   Medications:     . antiseptic oral rinse  15 mL Mouth Rinse BID  . aspirin EC  81 mg Oral Daily  . digoxin  0.125 mg Oral Daily  . enoxaparin (LOVENOX) injection  75 mg Subcutaneous Q24H  . folic acid  1 mg Oral Daily  . gabapentin  100 mg Oral TID  . insulin aspart  0-5 Units Subcutaneous QHS  . insulin aspart  0-9 Units Subcutaneous TID WC  . lipase/protease/amylase  2 capsule Oral TID WC  . metoprolol tartrate  12.5 mg Oral BID  . multivitamin  5 mL Oral Daily  . pantoprazole  40 mg Oral BID  . pneumococcal 23 valent vaccine  0.5 mL Intramuscular Tomorrow-1000  . sodium polystyrene  15 g Oral Once  . thiamine  100 mg Oral Daily   acetaminophen (TYLENOL) oral liquid 160 mg/5 mL, hydrALAZINE, HYDROcodone-acetaminophen, ipratropium-albuterol, ondansetron (ZOFRAN) IV, ondansetron (ZOFRAN) IV, oxyCODONE, zolpidem  Assessment/ Plan:  58 y.o. male who presented on 04/30/2013 with a past medical history of Chronic Pancreatitis; Atrial fibrillation; CHF; Morbid obesity; Chronic pain; Narcotic dependence; Respiratory failure; History of renal failure; History of ventral hernia repair; Cervical compression fracture; Hypertension; Hyperlipidemia; Degenerative joint disease; and Heart murmur who c/o altered mental status. Per history obtained from family, he was drinking excess amounts of fluid and V8 and urinating a lot for several days prior to admission. He was found on the floor by his family in the bathroom. He was noted to have a fever of 102 and a glucose of 900 with lactic acidosis   1. AKI, probably ATN volume depletion and contrast. Renal U/S negative for hydronephrosis. Patient also received  Vancomycin       Serologies ANCA   ANA and hepatitis are negative so far Renal function is stable   2. HTN volume controlled, diuresing well   3. Anemia stable  Nothing to add   History of positive MGUS  Needs follow up with hematology as outpatient   LOS: 87 Don Brown W '@TODAY' '@2' :06 PM
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
## 2013-05-16 NOTE — Progress Notes (Signed)
[facility] PROGRESS NOTE Interim History: 58 y.o. male who presented on 04/30/2013 with a past medical history of Chronic Pancreatitis; Atrial fibrillation; CHF; Morbid obesity; Chronic pain; Narcotic dependence; Respiratory failure;  who c/o altered mental status. Per history obtained from family, he was drinking excess amounts of fluid and V8 and urinating a lot for several days prior to admission. He was found on the floor by his family in the bathroom minimally responsive. He was noted to have a fever of 102 and a glucose of 900 with lactic acidosis. He was admitted to the crit were service and intubated He was also found to have acute pancreatitis, volume overload.  Patient was treated with vancomycin and Zosyn for healthcare associated pneumonia which was suspected but not confirmed cause of fever. White count normalized, fever resolved and patient completed full course of antibiotics by 2/16. Since then, however his renal function started to worsen and peaked as high as 3.56. Lasix and ACE inhibitor held. Nephrology was consulted and if the workup, creatinine felt to be secondary to vancomycin toxicity. Over the past 2 days, creatinine has continued to improve.  Pancreatitis has resolved. There were some narcotic seeking issues involving IV pain medications. The patient lost IV access, refuse further at this argument seems to have resolved itself. Initially was aggressively diuresed and down to 311 pounds, however with now trying to improve his renal function and Lasix on hold, his weight is back to what it was originally was at 330 pounds.  Assessment/Plan: Fever - Acute respiratory failure with hypoxia  - Source not confirmed but suspected to be pneumonia - ID has been following  - ID recommended last dose vancomycin and zosyn on 2/16  - Off antibiotics now  - WBC's WNL and no fever   Acute on Chronic pancreatitis  - Continue pancreatic enzymes.   - tolerate diet, change narcotics to  orals. Does not want oral narcotic relates causes him to be nauseated yet he has been tolerating his meals.He want IV dilaudid. When my partner refused, the patient became belligerent and fired him. When a second hospitalist saw the patient and refused narcotics, he too was fired. Pain seem sto be doing better & pt happy to leave IV out - Continue PRN antiemetics and pain meds orally.  AKI: - Cr starting to trend. Appreciate nephrology help. Felt to be mostly from vancomycin. - Multifactorial due to Vanc., ACE-I and pre-renal - Cont IV fluids Fena <1%. Will be carefull with IV fluids as he does have EF 20%. He is now becoming positive in regards to fluid balance. - Cont to hold lasix. Consulted renal. For Kayexalate today, which may worsen his function. Also, with renal function normal will really need to restart Lasix.  Atrial fibrillation  - Rate controlled  - anticoag has not been intitiated due to chronic thrombocytopenia.   CHF / Nonischemic cardiomyopathy  - EF 20-25% with diffuse hypokinesis  - Hold lasix and digoxin; ACE discontinue due to acute renal failure. Dig. level in am - Low sodium diet  - Daily weight and strict intake and output. Unfortunately, in the last few days, patient is up 20 pounds. Once renal function normalized, will need to restart Lasix.  Weight close to near admission  Hyperosmolar non-ketotic state in patient with new diagnosis type 2 diabetes mellitus  - Cont Lantus and sliding scale. Pt refused carb modified diet. He relates he wants a regular diet. - Hemoglobin A1c 12.5  - Will need insulin at discharge.  Have decrease insulin due to AKI. - Diabetes educator and nutrition service consulted  - CBGs better controlled past 24 hours after adjustment in insulin dose  - Patient more active and engage on injecting insulin by himself.  Left lower extremity numbness and tingling  - Suspect related to neuropathy in patient with newly diagnosed diabetes that is  uncontrolled  - Continue blood sugar control  - cont neurontin   Hypokalemia -stable issue - Will replete and check Mg level.  Hypertension   blood pressure starting to trend up, likely from volume overload. Have added when necessary clonidine   Hyperkalemia Potassium has been trending up over the last few days. Not on supplemental potassium, as a function of worsening renal dysfunction. Kayexalate ordered today.  H/o MGUS- 2 yrs ago: - hematology has evaluated - recommending bone marrow bx in future.  - Previously saw [place]. Please review outpatient notes and advise for outpatient follow up.   Code Status: full Family Communication: Left message with daughter Disposition Plan: Home hopefully in a few days. Compliance will be a real issue for this patient, not sure if he will stay. Following improvement of renal function it will likely take some time to diurese him, which could potentially be done at home   Consultants:  Critical care care-initial attending  Infectious disease-source of infection-signed off  Nephrology  Procedures:  Ct abd  Echo  abd x-ray  Antibiotics:  vanc and zosyn-course complete  HPI/Subjective: Doing ok, c/o generalized nausea, but breathing ok,  Some dyspnea on exertion.  Tired.  Bowels moving well.  Wants to be discharged soon.  Objective: Filed Vitals:   05/15/13 1451 05/15/13 2058 05/16/13 0606 05/16/13 1032  BP: 135/65 150/84 156/91 122/74  Pulse: 69 63 65 72  Temp: 98.6 F (37 C) 97 F (36.1 C) 97.6 F (36.4 C)   TempSrc: Oral Oral Oral   Resp: 20 20 20    Height:      Weight:   148.871 kg (328 lb 3.2 oz)   SpO2: 98% 98% 97% 97%    Intake/Output Summary (Last 24 hours) at 05/16/13 1402 Last data filed at 05/16/13 1151  Gross per 24 hour  Intake   1160 ml  Output   2825 ml  Net  -1665 ml   Filed Weights   05/14/13 0526 05/15/13 0647 05/16/13 0606  Weight: 150.2 kg (331 lb 2.1 oz) 147.011 kg (324 lb 1.6 oz) 148.871 kg  (328 lb 3.2 oz)    Exam:  General: Alert, awake, oriented x3, in no acute distress.  HEENT: No bruits, no goiter.  Heart: Regular rate and rhythm, without murmurs, rubs, gallops.  Lungs: Good air movement, clear to auscultation-decreased from body habitus Abdomen: Soft, epigastric and left quadrant tenderness, nondistended, positive bowel sounds.  Extremities: No clubbing or cyanosis, trace edema bilaterally   Data Reviewed: Basic Metabolic Panel:  Recent Labs Lab 05/11/13 1004 05/12/13 0643 05/13/13 0830 05/14/13 0744 05/15/13 0718  NA 144 142 147 143 143  K 4.2 4.4 5.0 5.3 4.7  CL 105 104 109 108 107  CO2 28 26 28 26 28   GLUCOSE 117* 150* 169* 152* 136*  BUN 22 24* 22 20 19   CREATININE 3.24* 3.32* 3.56* 3.22* 3.13*  CALCIUM 7.9* 7.9* 8.1* 8.4 8.5  PHOS  --   --   --   --  3.6   Liver Function Tests:  Recent Labs Lab 05/10/13 0324 05/14/13 0744 05/15/13 0718  AST 20 14  --  ALT 11 9  --   ALKPHOS 46 49  --   BILITOT 0.3 0.3  --   PROT 7.5 7.8  --   ALBUMIN 2.6* 2.5* 2.5*    Recent Labs Lab 05/10/13 0324  LIPASE 39   No results found for this basename: AMMONIA,  in the last 168 hours CBC:  Recent Labs Lab 05/14/13 0744 05/15/13 0718  WBC 3.5* 3.3*  HGB 11.0* 10.9*  HCT 36.2* 35.1*  MCV 91.9 90.0  PLT 134* 151   Cardiac Enzymes:  Recent Labs Lab 05/13/13 1930  CKTOTAL 35   BNP (last 3 results) No results found for this basename: PROBNP,  in the last 8760 hours CBG:  Recent Labs Lab 05/15/13 1057 05/15/13 1619 05/15/13 2203 05/16/13 0718 05/16/13 1154  GLUCAP 155* 212* 159* 124* 125*    Recent Results (from the past 240 hour(s))  CLOSTRIDIUM DIFFICILE BY PCR     Status: None   Collection Time    05/06/13  6:06 PM      Result Value Ref Range Status   C difficile by pcr NEGATIVE  NEGATIVE Final     Studies: No results found.  Scheduled Meds: . antiseptic oral rinse  15 mL Mouth Rinse BID  . aspirin EC  81 mg Oral Daily   . digoxin  0.125 mg Oral Daily  . enoxaparin (LOVENOX) injection  75 mg Subcutaneous Q24H  . folic acid  1 mg Oral Daily  . gabapentin  100 mg Oral TID  . insulin aspart  0-5 Units Subcutaneous QHS  . insulin aspart  0-9 Units Subcutaneous TID WC  . lipase/protease/amylase  2 capsule Oral TID WC  . metoprolol tartrate  12.5 mg Oral BID  . multivitamin  5 mL Oral Daily  . pantoprazole  40 mg Oral BID  . pneumococcal 23 valent vaccine  0.5 mL Intramuscular Tomorrow-1000  . sodium polystyrene  15 g Oral Once  . thiamine  100 mg Oral Daily   Continuous Infusions:   Time spent: 25 minutes  [facility] Pager [phone]. If 8PM-8AM, please contact night-coverage at [email or web address], password [facility] 05/16/2013, 2:02 PM  LOS: 16 days
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
## 2013-05-16 NOTE — Progress Notes (Signed)
Pt c/o SOB.  Pt recently ambulated in hallway with PT with 02 sats in the low to mid 90's during exertion.  Upon arrival to room pt on phone c/o of feeling as if we can't catch his breath.  RN assessed.  Respirations 20 with 02 sat at 96% on RA.  Pt denies any chest pain, lightheadedness, or dizziness.  Per pt request pt placed on 2L/02 via [place] for comfort.  Pt currently 98% on 2L/02. Gae Gallop RN
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
## 2013-05-17 LAB — GLUCOSE, CAPILLARY
GLUCOSE-CAPILLARY: 180 mg/dL — AB (ref 70–99)
Glucose-Capillary: 148 mg/dL — ABNORMAL HIGH (ref 70–99)

## 2013-05-17 LAB — C3 COMPLEMENT: C3 COMPLEMENT: 176 mg/dL (ref 90–180)

## 2013-05-17 LAB — C4 COMPLEMENT: Complement C4, Body Fluid: 28 mg/dL (ref 10–40)

## 2013-05-17 LAB — ANTISTREPTOLYSIN O TITER: ASO: <25 IU/mL (ref <409)

## 2013-05-17 MED ORDER — INSULIN ASPART 100 UNIT/ML ~~LOC~~ SOLN: SUBCUTANEOUS | Status: DC

## 2013-05-17 MED ORDER — FREESTYLE SYSTEM KIT: 1.0000 | PACK | Freq: Three times a day (TID) | Status: DC

## 2013-05-17 MED ORDER — THIAMINE HCL 100 MG PO TABS: 100.0000 mg | ORAL_TABLET | Freq: Every day | ORAL | Status: DC

## 2013-05-17 MED ORDER — PANTOPRAZOLE SODIUM 40 MG PO TBEC: 40.0000 mg | DELAYED_RELEASE_TABLET | Freq: Two times a day (BID) | ORAL | Status: DC

## 2013-05-17 MED ORDER — OXYCODONE HCL 5 MG PO TABS
10.0000 mg | ORAL_TABLET | Freq: Four times a day (QID) | ORAL | Status: DC | PRN
Start: 2013-05-17 — End: 2013-10-03

## 2013-05-17 MED ORDER — FOLIC ACID 1 MG PO TABS: 1.0000 mg | ORAL_TABLET | Freq: Every day | ORAL | Status: DC

## 2013-05-17 MED ORDER — HYDROCODONE-ACETAMINOPHEN 5-325 MG PO TABS
1.0000 | ORAL_TABLET | Freq: Four times a day (QID) | ORAL | Status: DC | PRN
Start: 2013-05-17 — End: 2013-10-03

## 2013-05-17 NOTE — Discharge Instructions (Signed)
Follow with Primary MD at [facility] in 3 days, also follow with a kidney and GI physician within 7-10 days.    Get CBC, CMP, checked 3 days by Primary MD and again as instructed by your Primary MD. Get a 2 view Chest X ray done next visit.  Accuchecks 4 times/day, Once in AM empty stomach and then before each meal. Log in all results and show them to your Prim.MD in 3 days. If any glucose reading is under 80 or above 300 call your Prim MD immidiately. Follow Low glucose instructions for glucose under 80 as instructed.   Activity: As tolerated with Full fall precautions use walker/cane & assistance as needed   Disposition Home    Diet: Heart Healthy - Low carb .Check your Weight same time everyday, if you gain over 2 pounds, or you develop in leg swelling, experience more shortness of breath or chest pain, call your Primary MD immediately. Follow Cardiac Low Salt Diet and 1.8 lit/day fluid restriction.   On your next visit with her primary care physician please Get Medicines reviewed and adjusted.  Please request your Prim.MD to go over all Hospital Tests and Procedure/Radiological results at the follow up, please get all Hospital records sent to your Prim MD by signing hospital release before you go home.   If you experience worsening of your admission symptoms, develop shortness of breath, life threatening emergency, suicidal or homicidal thoughts you must seek medical attention immediately by calling 911 or calling your MD immediately  if symptoms less severe.  You Must read complete instructions/literature along with all the possible adverse reactions/side effects for all the Medicines you take and that have been prescribed to you. Take any new Medicines after you have completely understood and accpet all the possible adverse reactions/side effects.   Do not drive and provide baby sitting services if your were admitted for syncope or siezures until you have seen by Primary MD or a  Neurologist and advised to do so again.  Do not drive when taking Pain medications.    Do not take more than prescribed Pain, Sleep and Anxiety Medications  Special Instructions: If you have smoked or chewed Tobacco  in the last 2 yrs please stop smoking, stop any regular Alcohol  and or any Recreational drug use.  Wear Seat belts while driving.   Please note  You were cared for by a hospitalist during your hospital stay. If you have any questions about your discharge medications or the care you received while you were in the hospital after you are discharged, you can call the unit and asked to speak with the hospitalist on call if the hospitalist that took care of you is not available. Once you are discharged, your primary care physician will handle any further medical issues. Please note that NO REFILLS for any discharge medications will be authorized once you are discharged, as it is imperative that you return to your primary care physician (or establish a relationship with a primary care physician if you do not have one) for your aftercare needs so that they can reassess your need for medications and monitor your lab values.

## 2013-05-17 NOTE — Progress Notes (Signed)
Dr Candiss Norse into see patient . Patient ate full breakfast ,denies discomfort or pain  . Patient has agreed with discharge home today. VSS patient is alert and oriented x 4.

## 2013-05-17 NOTE — Progress Notes (Signed)
UR completed Areona Homer K. Hurshel Bouillon, RN, BSN, [place], CCM  05/17/2013 3:22 PM

## 2013-05-17 NOTE — Progress Notes (Signed)
PT Cancellation Note  Patient Details Name: Olegario Emberson MRN: [id number] DOB: [birth year]   Cancelled Treatment:    Reason Eval/Treat Not Completed: Other (comment). Pt refusing to ambulate with PT at this time. States "my belly is too full and im going home today." Pt encouraged to practice steps to enter house but continues to refuse.    Elie Confer [place], [place] 05/17/2013, 8:31 AM

## 2013-05-17 NOTE — Discharge Summary (Signed)
Don Brown, is a 58 y.o. male  DOB 1955/10/23  MRN [id number].  Admission date:  04/30/2013  Admitting Physician  Raylene Miyamoto, MD  Discharge Date:  05/17/2013   Primary MD  Provider Not In System  Recommendations for primary care physician for things to follow:   Follow weight, BMP closely, monitor diuretic dose.  Monitor narcotic use. A1c and glycemic control   Admission Diagnosis  Hyperosmolar non-ketotic state in patient with type 2 diabetes mellitus   Discharge Diagnosis  Hyperosmolar non-ketotic state in patient with type 2 diabetes mellitus    Principal Problem:   Fever Active Problems:   Atrial fibrillation   Acute on chronic combined systolic and diastolic heart failure   Nonischemic cardiomyopathy   Hyperosmolar non-ketotic state in patient with type 2 diabetes mellitus   Lactic acidosis   Delirium, acute   Chronic pancreatitis   Acute respiratory failure   Pancreatitis, acute   AKI (acute kidney injury)   Hyperkalemia      Past Medical History  Diagnosis Date  . 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 Atrial fibrillation   . CHF (congestive heart failure)     Chronic systolic heart failure with an ejection fraction of 40%- 45%  . Morbid obesity   . Chronic pain   . Narcotic dependence, episodic use     chronic narcotic use  . Respiratory failure     History of ventilatory-dependent respiratory failure and tracheostomy   . History of renal failure     requiring hemodialysis in the past with normal renal function at this point  . History of ventral hernia repair   . Cervical compression fracture     Cervical disk disease requiring surgery  . Hypertension   .  Hyperlipidemia   . Degenerative joint disease   . Heart murmur   . MGUS (monoclonal gammopathy of unknown significance)     Past Surgical History  Procedure Laterality Date  . Ventral hernia repair    . Cholecystectomy    . Tracheostomy    . Pancreas surgery    . Pancreatic stents    . Skin graft    . Insertion of mesh       Discharge Condition: stable   Follow UP  Follow-up Information   Follow up with PCP at [facility]. Schedule an appointment as soon as possible for a visit in 3 days. (Kidney, heart  doctor in stomach doctor at [facility] within a week)       Follow up with [facility], MD. Schedule an appointment as soon as possible for a visit in 1 week.   Specialty:  Nephrology   Contact information:   [address]                          [place]  [address] [phone]  Follow up with Silvano Rusk, MD. Schedule an appointment as soon as possible for a visit in 1 week.   Specialty:  Gastroenterology   Contact information:   [address] [address] [phone]         Discharge Instructions  and  Discharge Medications          Discharge Orders   Future Orders Complete By Expires   Diet - low sodium heart healthy  As directed    Discharge instructions  As directed    Comments:     Follow with Primary MD at [facility] in 3 days, also follow with a kidney and GI physician within 7-10 days.   Accuchecks 4 times/day, Once in AM empty stomach and then before each meal. Log in all results and show them to your Prim.MD in 3 days. If any glucose reading is under 80 or above 300 call your Prim MD immidiately. Follow Low glucose instructions for glucose under 80 as instructed.   Get CBC, CMP, checked 3 days by Primary MD and again as instructed by your Primary MD. Get a 2 view Chest X ray done next visit.   Activity: As tolerated with Full fall precautions use walker/cane & assistance as needed   Disposition Home    Diet: Heart Healthy - Low  carb .Check your Weight same time everyday, if you gain over 2 pounds, or you develop in leg swelling, experience more shortness of breath or chest pain, call your Primary MD immediately. Follow Cardiac Low Salt Diet and 1.8 lit/day fluid restriction.   On your next visit with her primary care physician please Get Medicines reviewed and adjusted.  Please request your Prim.MD to go over all Hospital Tests and Procedure/Radiological results at the follow up, please get all Hospital records sent to your Prim MD by signing hospital release before you go home.   If you experience worsening of your admission symptoms, develop shortness of breath, life threatening emergency, suicidal or homicidal thoughts you must seek medical attention immediately by calling 911 or calling your MD immediately  if symptoms less severe.  You Must read complete instructions/literature along with all the possible adverse reactions/side effects for all the Medicines you take and that have been prescribed to you. Take any new Medicines after you have completely understood and accpet all the possible adverse reactions/side effects.   Do not drive and provide baby sitting services if your were admitted for syncope or siezures until you have seen by Primary MD or a Neurologist and advised to do so again.  Do not drive when taking Pain medications.    Do not take more than prescribed Pain, Sleep and Anxiety Medications  Special Instructions: If you have smoked or chewed Tobacco  in the last 2 yrs please stop smoking, stop any regular Alcohol  and or any Recreational drug use.  Wear Seat belts while driving.   Please note  You were cared for by a hospitalist during your hospital stay. If you have any questions about your discharge medications or the care you received while you were in the hospital after you are discharged, you can call the unit and asked to speak with the hospitalist on call if the hospitalist that took  care of you is not available. Once you are discharged, your primary care physician will handle any further medical issues. Please note that NO REFILLS for any discharge medications will be authorized once you are discharged, as it is imperative that  you return to your primary care physician (or establish a relationship with a primary care physician if you do not have one) for your aftercare needs so that they can reassess your need for medications and monitor your lab values.   Increase activity slowly  As directed        Medication List    STOP taking these medications       ibuprofen 200 MG tablet  Commonly known as:  ADVIL,MOTRIN     lisinopril 10 MG tablet  Commonly known as:  PRINIVIL,ZESTRIL      TAKE these medications       aspirin 81 MG tablet  Take 81 mg by mouth daily before breakfast.     digoxin 0.125 MG tablet  Commonly known as:  LANOXIN  Take 125 mcg by mouth every morning.     folic acid 1 MG tablet  Commonly known as:  FOLVITE  Take 1 tablet (1 mg total) by mouth daily.     furosemide 40 MG tablet  Commonly known as:  LASIX  Take 40 mg by mouth daily as needed. Fluid     glucose monitoring kit monitoring kit  1 each by Does not apply route 4 (four) times daily - after meals and at bedtime. 1 month Diabetic Testing Supplies for QAC-QHS accuchecks.Any brand OK     GREEN TEA PO  Take 2 tablets by mouth daily.     HYDROcodone-acetaminophen 5-325 MG per tablet  Commonly known as:  NORCO/VICODIN  Take 1 tablet by mouth every 6 (six) hours as needed. Pain     insulin aspart 100 UNIT/ML injection  Commonly known as:  NOVOLOG  - Before each meal 3 times a day and then bedtime, 140-199 - 2 units, 200-250 - 4 units, 251-299 - 6 units,  300-349 - 8 units,  350 or above 10 units.  - Dispense syringes and needles as needed, Ok to switch to [employer] if approved. Okay to switch to any approved short-acting insulin.     metoprolol succinate 50 MG 24 hr tablet  Commonly  known as:  TOPROL-XL  Take 50 mg by mouth every morning.     oxyCODONE 5 MG immediate release tablet  Commonly known as:  Oxy IR/ROXICODONE  Take 2 tablets (10 mg total) by mouth every 6 (six) hours as needed.     PANCREASE PO  Take 2 capsules by mouth 3 (three) times daily with meals.     pantoprazole 40 MG tablet  Commonly known as:  PROTONIX  Take 1 tablet (40 mg total) by mouth 2 (two) times daily.     promethazine 25 MG tablet  Commonly known as:  PHENERGAN  Take 25 mg by mouth every 8 (eight) hours as needed for nausea or vomiting.     thiamine 100 MG tablet  Take 1 tablet (100 mg total) by mouth daily.     VITAMIN B 12 PO  Take 2 tablets by mouth daily.          Diet and Activity recommendation: See Discharge Instructions above   Consults obtained -    Critical care care.  Infectious disease-source of infection-signed off.   Nephrology   Major procedures and Radiology Reports - PLEASE review detailed and final reports for all details, in brief -       Ct Head Wo Contrast  04/30/2013   CLINICAL DATA:  Altered mental status.  EXAM: CT HEAD WITHOUT CONTRAST  TECHNIQUE: Contiguous axial images were obtained  from the base of the skull through the vertex without intravenous contrast.  COMPARISON:  None.  FINDINGS: Mild cerebral atrophy. No acute intracranial abnormality. Specifically, no hemorrhage, hydrocephalus, mass lesion, acute infarction, or significant intracranial injury. No acute calvarial abnormality. Visualized paranasal sinuses and mastoids clear. Orbital soft tissues unremarkable.  IMPRESSION: No acute intracranial abnormality.  Mild cerebral atrophy.   Electronically Signed   By: Rolm Baptise M.D.   On: 04/30/2013 21:41   Ct Abdomen Pelvis W Contrast  05/01/2013   CLINICAL DATA:  Evaluate for possible abdominal infection. Looking for source of sepsis.  EXAM: CT ABDOMEN AND PELVIS WITH CONTRAST  TECHNIQUE: Multidetector CT imaging of the abdomen and pelvis  was performed using the standard protocol following bolus administration of intravenous contrast.  CONTRAST:  156m OMNIPAQUE IOHEXOL 350 MG/ML SOLN  COMPARISON:  01/05/2011.  FINDINGS: There is confluent opacity in the right lower lobe at the posterior lung base. This is most likely atelectasis. There is a smaller area similar opacity at the left posterior medial lung base. There are adjacent coarse reticular opacities likely additional subsegmental atelectasis. The heart is mildly enlarged. Infiltrate at either lung base is possible.  There are 2 small low-density lesions in the lateral segment of the left lobe of the liver, with a third in the posterior medial right upper lobe and a fourth from the caudate lobe these were present previously. There are most likely cysts. There is another round cystic lesion lies along the gallbladder fossa. This may simply reflect gallbladder. It is stable. There is some generalized increased attenuation from the caudate lobe.  The spleen is unremarkable. No bile duct dilation. There is irregular dilation of the pancreatic duct leading to a and irregular cystic appearance throughout pancreatic tail. This is stable. No discrete pancreatic mass or inflammatory changes seen.  No adrenal masses. Kidneys and ureters are unremarkable. The bladder is decompressed by a Foley catheter.  There are mildly enlarged gastrohepatic ligament lymph nodes, largest measuring 15 mm in short axis. There are mildly prominent retroperitoneal lymph nodes none of which are pathologically enlarged by size criteria. There is no ascites.  The colon and small bowel are unremarkable. A normal size appendix is seen.  There are prominent, tortuous vessels along the anterior and greater curvature region of the stomach. Nasogastric tube lies in the distal stomach. There is slight inflammatory type stranding adjacent to the stomach particularly the antrum and pylorus region. This could reflect gastritis.  There are  degenerative changes along the visible spine. No osteoblastic or osteolytic lesions. No CT evidence of discitis.  IMPRESSION: 1. There are multiple abnormalities as detailed above. 2. Lung base opacity now right lower lobe greater than left lower lobe bubbles likely atelectasis. Pneumonia is possible. 3. Low-density liver lesions, likely cysts it is stable. 4. Irregular cystic change along the tail the pancreas much of which likely a tortuous dilated duct. This suggests chronic pancreatitis. There is no convincing acute pancreatitis. 5. There is prominent vascularity adjacent to the stomach with subtle hazy opacity in the fat adjacent to the gastric antrum and pylorus. Consider gastritis in the proper clinical setting. 6. Mild adenopathy along the gastrohepatic ligament. 7. There is no evidence of an abscess. Below the diaphragm, there is no convincing source for sepsis. No evidence of bowel ischemia.   Electronically Signed   By: DLajean ManesM.D.   On: 05/01/2013 15:33   [place]  05/14/2013   CLINICAL DATA:  Acute renal failure, history CHF,  hypertension  EXAM: RENAL/URINARY TRACT ULTRASOUND COMPLETE  COMPARISON:  CT abdomen and pelvis 05/01/2013  FINDINGS: Image quality degraded secondary to body habitus.  Right Kidney:  Length: 13.4 cm. Normal cortical thickness and echogenicity. No mass, hydronephrosis or shadowing calcification.  Left Kidney:  Length: 13.3 cm. Normal cortical thickness and echogenicity. No mass, hydronephrosis or shadowing calcification.  Bladder:  Appears normal for degree of bladder distention.  IMPRESSION: No focal renal sonographic abnormalities identified with limitations of exam as above.   Electronically Signed   By: Lavonia Dana M.D.   On: 05/14/2013 14:55   Dg Chest Port 1 View  05/05/2013   CLINICAL DATA:  Hypoxia  EXAM: PORTABLE CHEST - 1 VIEW  COMPARISON:  May 04, 2013  FINDINGS: Endotracheal tube tip is 2.7 cm above the carina. Central catheter tip is in the superior  vena cava. Nasogastric tube tip and side port are below the diaphragm. No apparent pneumothorax.  Cardiomegaly persists. There is pulmonary venous hypertension. There is mild interstitial edema. There is medial consolidation in both lung bases. No adenopathy.  IMPRESSION: Findings felt to represent a degree of congestive heart failure with perhaps slightly more edema compared to 1 day prior. Tube and catheter positions are as described without pneumothorax.   Electronically Signed   By: Lowella Grip M.D.   On: 05/05/2013 07:15   Dg Chest Port 1 View  05/04/2013   CLINICAL DATA:  Evaluate endotracheal tube positioning  EXAM: PORTABLE CHEST - 1 VIEW  COMPARISON:  DG CHEST 1V PORT dated 05/02/2013; DG CHEST 1V PORT dated 05/01/2013  FINDINGS: Examination is degraded due to patient body habitus. Grossly unchanged enlarged cardiac silhouette and mediastinal contours given persistently reduced lung volumes and patient rotation. Stable position of support apparatus. No definite pneumothorax. Pulmonary venous congestion without definite evidence of edema. Grossly unchanged bibasilar opacities. Unchanged trace bilateral effusions. Unchanged bones.  IMPRESSION: 1.  Stable positioning of support apparatus.  No pneumothorax. 2. Worsening pulmonary venous congestion. 3. Persistent reduced lung volumes with grossly unchanged bibasilar opacities, atelectasis versus infiltrate   Electronically Signed   By: Sandi Mariscal M.D.   On: 05/04/2013 08:02   Dg Chest Port 1 View  05/03/2013   CLINICAL DATA:  Assess endotracheal tube placement  EXAM: PORTABLE CHEST - 1 VIEW  COMPARISON:  May 02, 2013 8 a.m.  FINDINGS: The heart size and mediastinal contours are stable. The heart size is enlarged. The aorta is tortuous. Endotracheal tube is seen with distal tip 3.9 cm from carina. There is no pneumothorax. Right jugular central venous line is unchanged. Both lungs are clear. The visualized skeletal structures are unremarkable.   IMPRESSION: Endotracheal tube distal tip 3.9 cm from carina. There is no pneumothorax. Cardiomegaly.   Electronically Signed   By: Abelardo Diesel M.D.   On: 05/03/2013 00:23   Dg Chest Port 1 View  05/02/2013   CLINICAL DATA:  Dyspnea  EXAM: PORTABLE CHEST - 1 VIEW  COMPARISON:  May 01, 2013  FINDINGS: Endotracheal tube tip is 8 mm above the carinal. Nasogastric tube tip and side port are below the diaphragm. Central catheter tip is at the cavoatrial junction. No pneumothorax.  There is mild atelectasis in the left base. The lungs are otherwise clear. Heart is enlarged with normal pulmonary vascularity, stable. No adenopathy.  IMPRESSION: Endotracheal tube tip is less than 1 cm above the carina. Consider withdrawal approximately 3 cm. No edema or consolidation. No pneumothorax.   Electronically Signed   By:  Lowella Grip M.D.   On: 05/02/2013 08:37   Dg Chest Port 1 View  05/01/2013   CLINICAL DATA:  ET tube placement  EXAM: PORTABLE CHEST - 1 VIEW  COMPARISON:  05/01/2013  FINDINGS: Endotracheal tube tip projects 2 cm above the chronic, well positioned.  New gastric tube extends below the diaphragm into the stomach.  No change in cardiomegaly or in the appearance of the lungs. Stable right internal jugular central venous line, well positioned.  IMPRESSION: Endotracheal tube and gastric tube are well positioned. No change from the earlier study.   Electronically Signed   By: Lajean Manes M.D.   On: 05/01/2013 12:03   Dg Chest Port 1 View  05/01/2013   CLINICAL DATA:  Central line placement.  EXAM: PORTABLE CHEST - 1 VIEW  COMPARISON:  05/01/2013 at 0051 hr  FINDINGS: Right internal jugular central venous line tip lies in the lower superior vena cava, well positioned. There is no pneumothorax.  Lung volumes are low. There is central vascular congestion, mild irregular interstitial thickening and cardiomegaly findings supporting mild congestive heart failure.  IMPRESSION: New right internal jugular  central venous line tip in the lower superior vena cava. No pneumothorax. No other change.   Electronically Signed   By: Lajean Manes M.D.   On: 05/01/2013 07:29   Dg Chest Port 1 View  05/01/2013   CLINICAL DATA:  Pneumonia  EXAM: PORTABLE CHEST - 1 VIEW  COMPARISON:  None.  FINDINGS: The lung volumes are low. The heart size is markedly enlarged. There is minimal fluid in the minor fissure. There is increased interstitial markings and central pulmonary markings bilaterally, left greater than right. There is patchy consolidation of medial left lung base. No acute abnormalities identified within the visualized bones.  IMPRESSION: Congestive heart failure. Patchy consolidation of medial left lung base which can be due to superimposed pneumonia.   Electronically Signed   By: Abelardo Diesel M.D.   On: 05/01/2013 01:06   Dg Abd Portable 1v  05/03/2013   CLINICAL DATA:  Nasogastric tube placement  EXAM: PORTABLE ABDOMEN - 1 VIEW  COMPARISON:  None.  FINDINGS: Nasogastric tube is identified with distal tip in the proximal to mid stomach. The stomach is air distended.  IMPRESSION: Nasogastric tube is identified with distal tip in the proximal to mid stomach.   Electronically Signed   By: Abelardo Diesel M.D.   On: 05/03/2013 00:41    Micro Results      No results found for this or any previous visit (from the past 240 hour(s)).   History of present illness and  Hospital Course:     Kindly see H&P for history of present illness and admission details, please review complete Labs, Consult reports and Test reports for all details in brief Don Brown, is a 58 y.o. male, patient with history of  Chronic Pancreatitis; Atrial fibrillation; CHF; Morbid obesity; Chronic pain; Narcotic dependence; Respiratory failure; who c/o altered mental status. Per history obtained from family, he was drinking excess amounts of fluid and V8 and urinating a lot for several days prior to admission. He was found on the floor by his  family in the bathroom minimally responsive. He was noted to have a fever of 102 and a glucose of 900 with lactic acidosis. He was admitted to the crit were service and intubated He was also found to have acute pancreatitis, volume overload.       Acute respiratory failure due to combination of  pneumonia and acute on chronic systolic heart failure  requiring intubation - Initially admitted in the critical care service and intubated, treated with Lasix and antibiotics under the guidance of ID and critical care subsequently extubated and transferred to the floor. He is finished his IV antibiotics and pneumonia has resolved, he is currently euvolemic and diuretics have been held. Critical care, infectious disease has signed off, I have discussed his care with renal personally today who have cleared him for home discharge on as needed Lasix only. His ACE inhibitor will be continued to be held as renal function is still not at baseline.     Acute on chronic nonischemic cardiomyopathy with systolic heart failure EF 20-25%. Also underlying atrial fibrillation - currently compensated, initiate home dose beta blocker and digoxin upon discharge, no ACE/ARB secondary to improving acute renal failure, counseled on dietary restriction, Lasix on as needed basis. Will follow with PCP and discuss long-term options of anticoagulation, and I position was not initiated her due to baseline thrombocytopenia. This was discussed with the patient he will discuss this with his PCP. He has been adviced to follow with his primary cardiologist post discharge    Acute renal failure. Thought to be secondary to vancomycin use, also some element of prerenal azotemia from excessive diuresis, currently euvolemic, renal function serially improving of stated Lasix, continue fluid restriction in the outpatient setting Lasix to be used on as-needed basis only, avoid NSAIDs, no ACE/ARB now until creatinine normalizes. Outpatient followup  with renal. His creatinine peaked at 3.5 is down to 2.8 today.     Patient was treated with vancomycin and Zosyn for healthcare associated pneumonia which was suspected but not confirmed cause of fever. White count normalized, fever resolved and patient completed full course of antibiotics by 2/16. Since then, however his renal function started to worsen and peaked as high as 3.56. Lasix and ACE inhibitor held. Nephrology was consulted and if the workup, creatinine felt to be secondary to vancomycin toxicity. Over the past 2 days, creatinine has continued to improve off diuretics, will now use diuretics on as needed basis with fluid restriction at baseline.     Nonketotic hyperosmolar state on admission, A1c was 12.5, he is been seen by diabetic educator and nutritionist, CBG is stable on sliding scale insulin which he will be provided along with glucometer, low glucose diet. We'll request PCP to continue monitoring his CBGs and adjust insulin as needed     CBG (last 3)   Recent Labs  05/16/13 2123 05/17/13 0421 05/17/13 0708  GLUCAP 138* 180* 148*      History of MGUS, chronic left lower extremity tingling and numbness which is much better today, hypertension. Stable outpatient followup with PCP post discharge.    Has been given written instructions to follow with primary care physician at [facility] within 3 days, his primary cardiologist, nephrologist and gastroenterologist in [place], alternative numbers for local gastroenterologist and nephrologist also provided.     Today   Subjective:   Don Brown today has no headache,no chest abdominal pain,no new weakness tingling or numbness, feels much better wants to go home today.     Objective:   Blood pressure 153/57, pulse 53, temperature 97.7 F (36.5 C), temperature source Oral, resp. rate 20, height 6' (1.829 m), weight 142.883 kg (315 lb), SpO2 95.00%.   Intake/Output Summary (Last 24 hours) at 05/17/13 0913 Last data filed  at 05/17/13 0816  Gross per 24 hour  Intake   1560 ml  Output   2601 ml  Net  -1041 ml    Exam Awake Alert, Oriented *3, No new F.N deficits, Normal affect [place].AT,PERRAL Supple Neck,No JVD, No cervical lymphadenopathy appriciated.  Symmetrical Chest wall movement, Good air movement bilaterally, CTAB RRR,No Gallops,Rubs or new Murmurs, No Parasternal Heave +ve B.Sounds, Abd Soft, Non tender, No organomegaly appriciated, No rebound -guarding or rigidity. No Cyanosis, Clubbing or edema, No new Rash or bruise  Data Review   CBC w Diff: Lab Results  Component Value Date   WBC 3.3* 05/15/2013   WBC 3.2* 07/30/2011   HGB 10.9* 05/15/2013   HGB 14.2 07/30/2011   HCT 35.1* 05/15/2013   HCT 44.5 07/30/2011   PLT 151 05/15/2013   PLT 138* 07/30/2011   LYMPHOPCT 35 04/30/2013   LYMPHOPCT 29.8 07/30/2011   MONOPCT 3 04/30/2013   MONOPCT 6.4 07/30/2011   EOSPCT 1 04/30/2013   EOSPCT 3.6 07/30/2011   BASOPCT 0 04/30/2013   BASOPCT 1.1 07/30/2011    CMP: Lab Results  Component Value Date   NA 143 05/16/2013   K 4.8 05/16/2013   CL 105 05/16/2013   CO2 26 05/16/2013   BUN 18 05/16/2013   CREATININE 2.87* 05/16/2013   PROT 7.8 05/14/2013   ALBUMIN 2.7* 05/16/2013   BILITOT 0.3 05/14/2013   ALKPHOS 49 05/14/2013   AST 14 05/14/2013   ALT 9 05/14/2013  .   Total Time in preparing paper work, data evaluation and todays exam - 35 minutes  Thurnell Lose M.D on 05/17/2013 at 9:13 AM  [place]  [phone]

## 2013-07-07 ENCOUNTER — Emergency Department (HOSPITAL_COMMUNITY): Payer: Non-veteran care

## 2013-07-07 ENCOUNTER — Encounter (HOSPITAL_COMMUNITY): Payer: Self-pay | Admitting: Emergency Medicine

## 2013-07-07 ENCOUNTER — Inpatient Hospital Stay (HOSPITAL_COMMUNITY)
Admission: EM | Admit: 2013-07-07 | Discharge: 2013-07-11 | DRG: 292 | Disposition: A | Discharge status: Home / Self Care | Payer: Non-veteran care | Attending: Internal Medicine | Admitting: Internal Medicine

## 2013-07-07 DIAGNOSIS — I5042 Chronic combined systolic (congestive) and diastolic (congestive) heart failure: Secondary | ICD-10-CM | POA: Insufficient documentation

## 2013-07-07 DIAGNOSIS — Z7982 Long term (current) use of aspirin: Secondary | ICD-10-CM

## 2013-07-07 DIAGNOSIS — E1100 Type 2 diabetes mellitus with hyperosmolarity without nonketotic hyperglycemic-hyperosmolar coma (NKHHC): Secondary | ICD-10-CM

## 2013-07-07 DIAGNOSIS — E119 Type 2 diabetes mellitus without complications: Secondary | ICD-10-CM

## 2013-07-07 DIAGNOSIS — I10 Essential (primary) hypertension: Secondary | ICD-10-CM | POA: Diagnosis present

## 2013-07-07 DIAGNOSIS — I509 Heart failure, unspecified: Secondary | ICD-10-CM

## 2013-07-07 DIAGNOSIS — I4891 Unspecified atrial fibrillation: Secondary | ICD-10-CM

## 2013-07-07 DIAGNOSIS — E785 Hyperlipidemia, unspecified: Secondary | ICD-10-CM | POA: Diagnosis present

## 2013-07-07 DIAGNOSIS — Z794 Long term (current) use of insulin: Secondary | ICD-10-CM

## 2013-07-07 DIAGNOSIS — R109 Unspecified abdominal pain: Secondary | ICD-10-CM

## 2013-07-07 DIAGNOSIS — D472 Monoclonal gammopathy: Secondary | ICD-10-CM | POA: Diagnosis present

## 2013-07-07 DIAGNOSIS — I498 Other specified cardiac arrhythmias: Secondary | ICD-10-CM | POA: Diagnosis present

## 2013-07-07 DIAGNOSIS — K859 Acute pancreatitis without necrosis or infection, unspecified: Secondary | ICD-10-CM

## 2013-07-07 DIAGNOSIS — R410 Disorientation, unspecified: Secondary | ICD-10-CM

## 2013-07-07 DIAGNOSIS — I38 Endocarditis, valve unspecified: Secondary | ICD-10-CM

## 2013-07-07 DIAGNOSIS — R079 Chest pain, unspecified: Secondary | ICD-10-CM

## 2013-07-07 DIAGNOSIS — I428 Other cardiomyopathies: Secondary | ICD-10-CM

## 2013-07-07 DIAGNOSIS — N179 Acute kidney failure, unspecified: Secondary | ICD-10-CM

## 2013-07-07 DIAGNOSIS — Z6841 Body Mass Index (BMI) 40.0 and over, adult: Secondary | ICD-10-CM

## 2013-07-07 DIAGNOSIS — M1990 Unspecified osteoarthritis, unspecified site: Secondary | ICD-10-CM | POA: Diagnosis present

## 2013-07-07 DIAGNOSIS — E872 Acidosis, unspecified: Secondary | ICD-10-CM

## 2013-07-07 DIAGNOSIS — K430 Incisional hernia with obstruction, without gangrene: Secondary | ICD-10-CM

## 2013-07-07 DIAGNOSIS — Z79899 Other long term (current) drug therapy: Secondary | ICD-10-CM

## 2013-07-07 DIAGNOSIS — I5023 Acute on chronic systolic (congestive) heart failure: Secondary | ICD-10-CM

## 2013-07-07 DIAGNOSIS — I5043 Acute on chronic combined systolic (congestive) and diastolic (congestive) heart failure: Principal | ICD-10-CM

## 2013-07-07 DIAGNOSIS — R509 Fever, unspecified: Secondary | ICD-10-CM

## 2013-07-07 DIAGNOSIS — E876 Hypokalemia: Secondary | ICD-10-CM | POA: Diagnosis not present

## 2013-07-07 DIAGNOSIS — J9600 Acute respiratory failure, unspecified whether with hypoxia or hypercapnia: Secondary | ICD-10-CM

## 2013-07-07 DIAGNOSIS — K861 Other chronic pancreatitis: Secondary | ICD-10-CM

## 2013-07-07 DIAGNOSIS — E875 Hyperkalemia: Secondary | ICD-10-CM

## 2013-07-07 HISTORY — DX: Type 2 diabetes mellitus without complications: E11.9

## 2013-07-07 HISTORY — DX: Essential (primary) hypertension: I10

## 2013-07-07 HISTORY — DX: Chronic systolic (congestive) heart failure: I50.22

## 2013-07-07 LAB — CBC
HEMATOCRIT: 34.8 % — AB (ref 39.0–52.0)
HEMOGLOBIN: 10.9 g/dL — AB (ref 13.0–17.0)
MCH: 27.6 pg (ref 26.0–34.0)
MCHC: 31.3 g/dL (ref 30.0–36.0)
MCV: 88.1 fL (ref 78.0–100.0)
Platelets: 133 10*3/uL — ABNORMAL LOW (ref 150–400)
RBC: 3.95 MIL/uL — ABNORMAL LOW (ref 4.22–5.81)
RDW: 15.1 % (ref 11.5–15.5)
WBC: 3.6 10*3/uL — ABNORMAL LOW (ref 4.0–10.5)

## 2013-07-07 LAB — BASIC METABOLIC PANEL
BUN: 13 mg/dL (ref 6–23)
CALCIUM: 8.3 mg/dL — AB (ref 8.4–10.5)
CO2: 23 mEq/L (ref 19–32)
Chloride: 108 mEq/L (ref 96–112)
Creatinine, Ser: 0.85 mg/dL (ref 0.50–1.35)
GFR calc Af Amer: >90 mL/min (ref >90)
GFR calc non Af Amer: >90 mL/min (ref >90)
GLUCOSE: 108 mg/dL — AB (ref 70–99)
Potassium: 3.7 mEq/L (ref 3.7–5.3)
SODIUM: 144 meq/L (ref 137–147)

## 2013-07-07 LAB — HEPATIC FUNCTION PANEL
ALT: 9 U/L (ref 0–53)
AST: 17 U/L (ref 0–37)
Albumin: 3.1 g/dL — ABNORMAL LOW (ref 3.5–5.2)
Alkaline Phosphatase: 55 U/L (ref 39–117)
Bilirubin, Direct: <0.2 mg/dL (ref 0.0–0.3)
Total Bilirubin: 0.6 mg/dL (ref 0.3–1.2)
Total Protein: 8 g/dL (ref 6.0–8.3)

## 2013-07-07 LAB — PRO B NATRIURETIC PEPTIDE: Pro B Natriuretic peptide (BNP): 5002 pg/mL — ABNORMAL HIGH (ref 0–125)

## 2013-07-07 LAB — LIPASE, BLOOD: LIPASE: 9 U/L — AB (ref 11–59)

## 2013-07-07 LAB — PROTIME-INR
INR: 1.08 (ref 0.00–1.49)
Prothrombin Time: 13.8 seconds (ref 11.6–15.2)

## 2013-07-07 LAB — CBG MONITORING, ED: GLUCOSE-CAPILLARY: 81 mg/dL (ref 70–99)

## 2013-07-07 LAB — DIGOXIN LEVEL: Digoxin Level: 0.5 ng/mL — ABNORMAL LOW (ref 0.8–2.0)

## 2013-07-07 LAB — I-STAT TROPONIN, ED: Troponin i, poc: 0.02 ng/mL (ref 0.00–0.08)

## 2013-07-07 LAB — I-STAT CG4 LACTIC ACID, ED: LACTIC ACID, VENOUS: 0.98 mmol/L (ref 0.5–2.2)

## 2013-07-07 LAB — GLUCOSE, CAPILLARY: Glucose-Capillary: 79 mg/dL (ref 70–99)

## 2013-07-07 MED ORDER — NITROGLYCERIN IN D5W 200-5 MCG/ML-% IV SOLN
2.0000 ug/min | Freq: Once | INTRAVENOUS | Status: DC
  Filled 2013-07-07: qty 250

## 2013-07-07 MED ORDER — FUROSEMIDE 10 MG/ML IJ SOLN
40.0000 mg | Freq: Once | INTRAMUSCULAR | Status: AC
Start: 2013-07-07 — End: 2013-07-07
  Administered 2013-07-07: 40 mg via INTRAVENOUS
  Filled 2013-07-07: qty 4

## 2013-07-07 MED ORDER — FENTANYL CITRATE 0.05 MG/ML IJ SOLN
100.0000 ug | Freq: Once | INTRAMUSCULAR | Status: AC
  Administered 2013-07-07: 100 ug via INTRAVENOUS
  Filled 2013-07-07: qty 2

## 2013-07-07 MED ORDER — FUROSEMIDE 10 MG/ML IJ SOLN
40.0000 mg | Freq: Two times a day (BID) | INTRAMUSCULAR | Status: DC
  Administered 2013-07-08 – 2013-07-11 (×7): 40 mg via INTRAVENOUS
  Filled 2013-07-07 (×11): qty 4

## 2013-07-07 MED ORDER — INSULIN ASPART 100 UNIT/ML ~~LOC~~ SOLN
0.0000 [IU] | Freq: Three times a day (TID) | SUBCUTANEOUS | Status: DC
  Administered 2013-07-08 – 2013-07-10 (×6): 2 [IU] via SUBCUTANEOUS
  Administered 2013-07-11: 3 [IU] via SUBCUTANEOUS
  Administered 2013-07-11: 2 [IU] via SUBCUTANEOUS

## 2013-07-07 MED ORDER — FOLIC ACID 1 MG PO TABS
1.0000 mg | ORAL_TABLET | Freq: Every day | ORAL | Status: DC
  Administered 2013-07-08 – 2013-07-11 (×4): 1 mg via ORAL
  Filled 2013-07-07 (×4): qty 1

## 2013-07-07 MED ORDER — FENTANYL CITRATE 0.05 MG/ML IJ SOLN: 100.0000 ug | Freq: Once | INTRAMUSCULAR | Status: DC

## 2013-07-07 MED ORDER — PANCRELIPASE (LIP-PROT-AMYL) 12000-38000 UNITS PO CPEP
2.0000 | ORAL_CAPSULE | Freq: Three times a day (TID) | ORAL | Status: DC
  Administered 2013-07-08 – 2013-07-11 (×11): 2 via ORAL
  Filled 2013-07-07 (×13): qty 2

## 2013-07-07 MED ORDER — METOPROLOL SUCCINATE ER 25 MG PO TB24
25.0000 mg | ORAL_TABLET | Freq: Every day | ORAL | Status: DC
  Administered 2013-07-08 – 2013-07-11 (×4): 25 mg via ORAL
  Filled 2013-07-07 (×4): qty 1

## 2013-07-07 MED ORDER — PANTOPRAZOLE SODIUM 40 MG PO TBEC
40.0000 mg | DELAYED_RELEASE_TABLET | Freq: Two times a day (BID) | ORAL | Status: DC
Start: 2013-07-07 — End: 2013-07-12
  Administered 2013-07-08 – 2013-07-11 (×7): 40 mg via ORAL
  Filled 2013-07-07 (×9): qty 1

## 2013-07-07 MED ORDER — HYDROMORPHONE HCL PF 1 MG/ML IJ SOLN
1.0000 mg | Freq: Once | INTRAMUSCULAR | Status: AC
  Administered 2013-07-07: 1 mg via INTRAVENOUS
  Filled 2013-07-07: qty 1

## 2013-07-07 MED ORDER — HYDROMORPHONE HCL PF 1 MG/ML IJ SOLN
1.0000 mg | INTRAMUSCULAR | Status: DC | PRN
  Administered 2013-07-07 – 2013-07-08 (×4): 1 mg via INTRAVENOUS
  Filled 2013-07-07 (×4): qty 1

## 2013-07-07 MED ORDER — HEPARIN SODIUM (PORCINE) 5000 UNIT/ML IJ SOLN
5000.0000 [IU] | Freq: Three times a day (TID) | INTRAMUSCULAR | Status: DC
  Filled 2013-07-07 (×12): qty 1

## 2013-07-07 MED ORDER — ONDANSETRON HCL 4 MG PO TABS: 4.0000 mg | ORAL_TABLET | Freq: Four times a day (QID) | ORAL | Status: DC | PRN

## 2013-07-07 MED ORDER — ACETAMINOPHEN 325 MG PO TABS
650.0000 mg | ORAL_TABLET | Freq: Four times a day (QID) | ORAL | Status: DC | PRN
  Administered 2013-07-11: 650 mg via ORAL
  Filled 2013-07-07: qty 2

## 2013-07-07 MED ORDER — ASPIRIN EC 81 MG PO TBEC
81.0000 mg | DELAYED_RELEASE_TABLET | Freq: Every day | ORAL | Status: DC
Start: 2013-07-08 — End: 2013-07-12
  Administered 2013-07-08 – 2013-07-11 (×4): 81 mg via ORAL
  Filled 2013-07-07 (×5): qty 1

## 2013-07-07 MED ORDER — SODIUM CHLORIDE 0.9 % IJ SOLN
3.0000 mL | Freq: Two times a day (BID) | INTRAMUSCULAR | Status: DC
  Administered 2013-07-07 – 2013-07-11 (×9): 3 mL via INTRAVENOUS

## 2013-07-07 MED ORDER — OXYCODONE HCL 5 MG PO TABS
5.0000 mg | ORAL_TABLET | ORAL | Status: DC | PRN
  Administered 2013-07-07 – 2013-07-08 (×2): 5 mg via ORAL
  Filled 2013-07-07 (×2): qty 1

## 2013-07-07 MED ORDER — ONDANSETRON HCL 4 MG/2ML IJ SOLN
4.0000 mg | Freq: Four times a day (QID) | INTRAMUSCULAR | Status: DC | PRN
  Administered 2013-07-07 – 2013-07-09 (×2): 4 mg via INTRAVENOUS
  Filled 2013-07-07 (×2): qty 2

## 2013-07-07 MED ORDER — INSULIN ASPART 100 UNIT/ML ~~LOC~~ SOLN: 0.0000 [IU] | Freq: Every day | SUBCUTANEOUS | Status: DC

## 2013-07-07 MED ORDER — DIGOXIN 125 MCG PO TABS
125.0000 ug | ORAL_TABLET | Freq: Every day | ORAL | Status: DC
  Administered 2013-07-08 – 2013-07-11 (×4): 125 ug via ORAL
  Filled 2013-07-07 (×5): qty 1

## 2013-07-07 MED ORDER — ACETAMINOPHEN 650 MG RE SUPP: 650.0000 mg | Freq: Four times a day (QID) | RECTAL | Status: DC | PRN

## 2013-07-07 NOTE — Progress Notes (Signed)
CSW asked to speak with pt regarding calling the [facility] on his behalf. Pt shared with CSW that the [place] has a protocol for veterans to follow when needing medical help. CSW placed call to [place] and shared with administration that pt is currently in [facility] ED. Pt appreciative of CSW efforts.   [address], [place]

## 2013-07-07 NOTE — ED Notes (Signed)
Jamas Lav., RN at the bedside.  I advised her that the patient requested to see a patient advocate.

## 2013-07-07 NOTE — ED Notes (Signed)
The Zoll and pads are at the bedside.

## 2013-07-07 NOTE — ED Notes (Signed)
I advised Dr. Alvino Chapel, the patient's heart rate was dropping to the high 30's.  Dr. Alvino Chapel reordered an EKG and responded to the patient's bedside.

## 2013-07-07 NOTE — ED Notes (Signed)
Results of lactic acid called to the primary nurse Marisela on POD E

## 2013-07-07 NOTE — ED Notes (Signed)
The patient called EMS due to SOB, substernal chest pain intermittent for two days.  Patient denied nausea at first, but got nauseous in the back of EMS.  The patient had already taken 325mg  of Aspirin prior to EMS arrival.  The patient rated his pain 5/10.  EMS transported him here to be evaluated.  Patient said he hasn't weighed himself but feels like he has gained weight.

## 2013-07-07 NOTE — ED Notes (Addendum)
Spoke with Satira Sark in the lab and asked her if she had blood for Hepatic Function Panel, Lipase, and Digoxin Level.  Varney Biles said she had enough blood to do all of them.

## 2013-07-07 NOTE — ED Provider Notes (Addendum)
CSN: [id number]     Arrival date & time 07/07/13  1129 History   First MD Initiated Contact with Patient 07/07/13 1223     Chief Complaint  Patient presents with  . Chest Pain    Substernal no radiation     (Consider location/radiation/quality/duration/timing/severity/associated sxs/prior Treatment) Patient is a 58 y.o. male presenting with chest pain. The history is provided by the patient.  Chest Pain Associated symptoms: abdominal pain and nausea   Associated symptoms: no back pain, no headache, no numbness, no shortness of breath, not vomiting and no weakness    patient presents with chest pain shortness of breath. He states he feels as if his weight is up. He also has had pain on the left chest. It is dull. No fevers. He's had a mild cough. He also set some abdominal pain and nausea. The pain as dull. It is different than his previous pancreatitis. He states he called the [place] but he was not able to get there. Chest pain as dull. He states he thinks his weight is up, however he does not have a working scale at home.  Past Medical History  Diagnosis Date  . 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 Atrial fibrillation   . Chronic systolic heart failure     LVEF 40-45%  . Morbid obesity   . Chronic pain   . Narcotic dependence, episodic use   . Respiratory failure     History of ventilatory-dependent respiratory failure and tracheostomy   . History of renal failure     Requiring hemodialysis in the past with normal renal function at this point  . History of ventral hernia repair   . Cervical compression fracture     Cervical disk disease requiring surgery  . Essential hypertension, benign   . Hyperlipidemia   . Degenerative joint disease   . Heart murmur   . MGUS (monoclonal gammopathy of unknown significance)   . Type 2 diabetes mellitus     Past Surgical History  Procedure Laterality Date  . Ventral hernia repair    . Cholecystectomy    . Tracheostomy    . Pancreas surgery    . Skin graft    . Insertion of mesh     Family History  Problem Relation Age of Onset  . Hypertension Mother   . Diabetes Mother   . Lymphoma Father    History  Substance Use Topics  . Smoking status: Never Smoker   . Smokeless tobacco: Not on file  . Alcohol Use: No    Review of Systems  Constitutional: Negative for activity change and appetite change.  Eyes: Negative for pain.  Respiratory: Negative for chest tightness and shortness of breath.   Cardiovascular: Positive for chest pain and leg swelling.  Gastrointestinal: Positive for nausea and abdominal pain. Negative for vomiting and diarrhea.  Genitourinary: Negative for flank pain.  Musculoskeletal: Negative for back pain and neck stiffness.  Skin: Negative for rash.  Neurological: Negative for weakness, numbness and headaches.  Psychiatric/Behavioral: Negative for behavioral problems.      Allergies  Morphine and related  Home Medications   Prior to Admission medications   Medication Sig Start Date End Date Taking? Authorizing Provider  Amylase-Lipase-Protease (PANCREASE PO) Take 2 capsules by mouth 3 (three) times daily with meals.    Yes Historical Provider, MD  aspirin 81 MG tablet  Take 81 mg by mouth daily before breakfast.    Yes Historical Provider, MD  Cyanocobalamin (VITAMIN B 12 PO) Take 2 tablets by mouth daily.    Yes Historical Provider, MD  digoxin (LANOXIN) 0.125 MG tablet Take 125 mcg by mouth every morning.    Yes Historical Provider, MD  folic acid (FOLVITE) 1 MG tablet Take 1 tablet (1 mg total) by mouth daily. 05/17/13  Yes Thurnell Lose, MD  furosemide (LASIX) 40 MG tablet Take 40 mg by mouth daily as needed. Fluid 01/14/11  Yes Peter M Martinique, MD  glucose monitoring kit (FREESTYLE) monitoring kit 1 each by Does not apply route 4 (four) times daily  - after meals and at bedtime. 1 month Diabetic Testing Supplies for QAC-QHS accuchecks.Any brand OK 05/17/13  Yes Thurnell Lose, MD  Nyoka Cowden Tea, Camillia sinensis, (GREEN TEA PO) Take 2 tablets by mouth daily.    Yes Historical Provider, MD  HYDROcodone-acetaminophen (NORCO/VICODIN) 5-325 MG per tablet Take 1 tablet by mouth every 6 (six) hours as needed. Pain 05/17/13  Yes Thurnell Lose, MD  insulin aspart (NOVOLOG) 100 UNIT/ML injection Before each meal 3 times a day and then bedtime, 140-199 - 2 units, 200-250 - 4 units, 251-299 - 6 units,  300-349 - 8 units,  350 or above 10 units. Dispense syringes and needles as needed, Ok to switch to PEN if approved. Okay to switch to any approved short-acting insulin. 05/17/13  Yes Thurnell Lose, MD  metoprolol (TOPROL-XL) 50 MG 24 hr tablet Take 50 mg by mouth every morning.    Yes Historical Provider, MD  oxyCODONE (OXY IR/ROXICODONE) 5 MG immediate release tablet Take 2 tablets (10 mg total) by mouth every 6 (six) hours as needed. 05/17/13  Yes Thurnell Lose, MD  pantoprazole (PROTONIX) 40 MG tablet Take 1 tablet (40 mg total) by mouth 2 (two) times daily. 05/17/13  Yes Thurnell Lose, MD  promethazine (PHENERGAN) 25 MG tablet Take 25 mg by mouth every 8 (eight) hours as needed for nausea or vomiting.   Yes Historical Provider, MD  thiamine 100 MG tablet Take 1 tablet (100 mg total) by mouth daily. 05/17/13  Yes Thurnell Lose, MD   BP 139/96  Pulse 98  Temp(Src) 98 F (36.7 C) (Oral)  Resp 20  Ht 6' (1.829 m)  Wt 312 lb 13.3 oz (141.9 kg)  BMI 42.42 kg/m2  SpO2 95% Physical Exam  Nursing note and vitals reviewed. Constitutional: He is oriented to person, place, and time. He appears well-developed and well-nourished.  HENT:  Head: Normocephalic and atraumatic.  Eyes: EOM are normal. Pupils are equal, round, and reactive to light.  Neck: Normal range of motion. Neck supple.  Cardiovascular: Normal heart sounds.   No murmur  heard. Irregular rhythm. Occasional bradycardia.  Pulmonary/Chest:  Mild dyspnea. Rales in bilateral bases.  Abdominal: Soft. Bowel sounds are normal. He exhibits no distension and no mass. There is tenderness. There is no rebound and no guarding.  Large upper abdominal scar. Diffuse upper abdominal tenderness without rebound or guarding.  Musculoskeletal: Normal range of motion. He exhibits edema.  Bilateral LE pitting edema  Neurological: He is alert and oriented to person, place, and time. No cranial nerve deficit.  Skin: Skin is warm and dry.  Psychiatric: He has a normal mood and affect.    ED Course  Procedures (including critical care time) Labs Review Labs Reviewed  CBC - Abnormal; Notable for the following:  WBC 3.6 (*)    RBC 3.95 (*)    Hemoglobin 10.9 (*)    HCT 34.8 (*)    Platelets 133 (*)    All other components within normal limits  BASIC METABOLIC PANEL - Abnormal; Notable for the following:    Glucose, Bld 108 (*)    Calcium 8.3 (*)    All other components within normal limits  PRO B NATRIURETIC PEPTIDE - Abnormal; Notable for the following:    Pro B Natriuretic peptide (BNP) 5002.0 (*)    All other components within normal limits  HEPATIC FUNCTION PANEL - Abnormal; Notable for the following:    Albumin 3.1 (*)    All other components within normal limits  LIPASE, BLOOD - Abnormal; Notable for the following:    Lipase 9 (*)    All other components within normal limits  DIGOXIN LEVEL - Abnormal; Notable for the following:    Digoxin Level 0.5 (*)    All other components within normal limits  CBC - Abnormal; Notable for the following:    Hemoglobin 12.0 (*)    HCT 38.7 (*)    All other components within normal limits  PRO B NATRIURETIC PEPTIDE - Abnormal; Notable for the following:    Pro B Natriuretic peptide (BNP) 3045.0 (*)    All other components within normal limits  BASIC METABOLIC PANEL - Abnormal; Notable for the following:    Potassium 3.6  (*)    Glucose, Bld 107 (*)    All other components within normal limits  GLUCOSE, CAPILLARY - Abnormal; Notable for the following:    Glucose-Capillary 130 (*)    All other components within normal limits  GLUCOSE, CAPILLARY - Abnormal; Notable for the following:    Glucose-Capillary 157 (*)    All other components within normal limits  PROTIME-INR  TSH  GLUCOSE, CAPILLARY  GLUCOSE, CAPILLARY  BASIC METABOLIC PANEL  CBG MONITORING, ED  I-STAT TROPOININ, ED  I-STAT CG4 LACTIC ACID, ED    Imaging Review Dg Chest Port 1 View  07/07/2013   CLINICAL DATA:  Chest pain  EXAM: PORTABLE CHEST - 1 VIEW  COMPARISON:  05/05/2013  FINDINGS: Cardiac shadow is enlarged. Vascular congestion consistent with CHF is noted. No focal infiltrate is seen. No bony abnormality is noted.  IMPRESSION: Changes consistent with congestive failure.   Electronically Signed   By: Inez Catalina M.D.   On: 07/07/2013 13:14     EKG Interpretation   Date/Time:  Friday July 07 2013 12:17:18 EDT Ventricular Rate:  56 PR Interval:    QRS Duration: 106 QT Interval:  500 QTC Calculation: 483 R Axis:   17 Text Interpretation:  Atrial fibrillation Nonspecific T abnrm,  anterolateral leads Borderline prolonged QT interval Confirmed by  Alvino Chapel  MD, Ovid Curd [phone]) on 07/07/2013 3:56:59 PM      MDM   Final diagnoses:  CHF (congestive heart failure)  Abdominal pain  Chest pain  Atrial fibrillation    Patient with shortness of breath abdominal pain. Appears to be volume overloaded. Will admit    Jasper Riling. Alvino Chapel, MD 07/09/13 [place] Alvino Chapel, MD 08/16/13 [place] Alvino Chapel, MD 08/16/13 1536

## 2013-07-07 NOTE — ED Notes (Signed)
I attempted to get labs to send for Istat lactic acid and Istat troponin and the patient jerked his arm.  He told me "get that damn needle out of my arm and get someone in here that knows what they are doing".  Saa from phlebotomy came in there and he refused to left Saa get any blood.  I advised Dr. Alvino Chapel of the patient refusing and he went in there to talk with him.  He advised the patient that he needed to let us get the labs so that we could treat his pain.  Almyra Free, RN drew back from his IV and Pascal Lux, EMT took the tube to the [facility] lab.

## 2013-07-07 NOTE — ED Notes (Signed)
The patient started having runs of ectopy.  I advised Dr. Alvino Chapel and printed out the rhythm strips.  He did it at 1333hrs. and then again in 1337hrs.  MD at the bedside.

## 2013-07-07 NOTE — ED Notes (Signed)
Pt refused infusion of nitroglycerin, states "I'll wait until the cardiologist gets here".

## 2013-07-07 NOTE — ED Notes (Signed)
Transporting patient to new room assignment. 

## 2013-07-07 NOTE — H&P (Signed)
[facility] History and Physical  Findlay Dagher GMW:[id number] DOB: 30-Mar-1955 DOA: 07/07/2013  Referring physician: Jasper Riling. Alvino Chapel, MD PCP: [facility]  Chief Complaint: Shortness of breath  HPI: Don Brown is a 58 y.o. African American male with past medical history of CHF with EF of 20-25%, diabetes. T1 morbid obesity. Patient came in to the hospital because of shortness of breath. Patient said for the past 2-3 days he started to have shortness of breath started with exertion and started to get worse, he also endorses some orthopnea and nonproductive cough. He came in to the hospital for further evaluation. In the ED his findings was consistent with acute CHF exacerbation with BNP of 5000, chest x-ray showed some vascular congestion consistent with CHF. Patient admitted to the hospital for further evaluation.  Review of Systems:  Constitutional: negative for anorexia, fevers and sweats Eyes: negative for irritation, redness and visual disturbance Ears, nose, mouth, throat, and face: negative for earaches, epistaxis, nasal congestion and sore throat Respiratory: negative for cough, dyspnea on exertion, sputum and wheezing Cardiovascular: n positive for chest pain, dyspnea, orthopnea, palpitations. Gastrointestinal: negative for abdominal pain, constipation, diarrhea, melena, nausea and vomiting Genitourinary:negative for dysuria, frequency and hematuria Hematologic/lymphatic: negative for bleeding, easy bruising and lymphadenopathy Musculoskeletal:negative for arthralgias, muscle weakness and stiff joints Neurological: negative for coordination problems, gait problems, headaches and weakness Endocrine: negative for diabetic symptoms including polydipsia, polyuria and weight loss Allergic/Immunologic: negative for anaphylaxis, hay fever and urticaria  Past Medical History  Diagnosis Date  . Pancreatitis     Severe pancreatitis status post surgery with recurrent mid to  proximal pancreatic pseudocyst status post multiple endoscopic drainage procedures and stents   . Atrial fibrillation   . CHF (congestive heart failure)     Chronic systolic heart failure with an ejection fraction of 40%- 45%  . Morbid obesity   . Chronic pain   . Narcotic dependence, episodic use     chronic narcotic use  . Respiratory failure     History of ventilatory-dependent respiratory failure and tracheostomy   . History of renal failure     requiring hemodialysis in the past with normal renal function at this point  . History of ventral hernia repair   . Cervical compression fracture     Cervical disk disease requiring surgery  . Hypertension   . Hyperlipidemia   . Degenerative joint disease   . Heart murmur   . MGUS (monoclonal gammopathy of unknown significance)    Past Surgical History  Procedure Laterality Date  . Ventral hernia repair    . Cholecystectomy    . Tracheostomy    . Pancreas surgery    . Pancreatic stents    . Skin graft    . Insertion of mesh     Social History:   reports that he has never smoked. He does not have any smokeless tobacco history on file. He reports that he does not drink alcohol or use illicit drugs.  Allergies  Allergen Reactions  . Morphine And Related Nausea And Vomiting    Family History  Problem Relation Age of Onset  . Hypertension Mother   . Diabetes Mother   . Lymphoma Father      Prior to Admission medications   Medication Sig Start Date End Date Taking? Authorizing Provider  Amylase-Lipase-Protease (PANCREASE PO) Take 2 capsules by mouth 3 (three) times daily with meals.    Yes Historical Provider, MD  aspirin 81 MG tablet Take 81 mg by mouth  daily before breakfast.    Yes Historical Provider, MD  Cyanocobalamin (VITAMIN B 12 PO) Take 2 tablets by mouth daily.    Yes Historical Provider, MD  digoxin (LANOXIN) 0.125 MG tablet Take 125 mcg by mouth every morning.    Yes Historical Provider, MD  folic acid (FOLVITE)  1 MG tablet Take 1 tablet (1 mg total) by mouth daily. 05/17/13  Yes Thurnell Lose, MD  furosemide (LASIX) 40 MG tablet Take 40 mg by mouth daily as needed. Fluid 01/14/11  Yes Peter M Martinique, MD  glucose monitoring kit (FREESTYLE) monitoring kit 1 each by Does not apply route 4 (four) times daily - after meals and at bedtime. 1 month Diabetic Testing Supplies for QAC-QHS accuchecks.Any brand OK 05/17/13  Yes Thurnell Lose, MD  Nyoka Cowden Tea, Camillia sinensis, (GREEN TEA PO) Take 2 tablets by mouth daily.    Yes Historical Provider, MD  HYDROcodone-acetaminophen (NORCO/VICODIN) 5-325 MG per tablet Take 1 tablet by mouth every 6 (six) hours as needed. Pain 05/17/13  Yes Thurnell Lose, MD  insulin aspart (NOVOLOG) 100 UNIT/ML injection Before each meal 3 times a day and then bedtime, 140-199 - 2 units, 200-250 - 4 units, 251-299 - 6 units,  300-349 - 8 units,  350 or above 10 units. Dispense syringes and needles as needed, Ok to switch to PEN if approved. Okay to switch to any approved short-acting insulin. 05/17/13  Yes Thurnell Lose, MD  metoprolol (TOPROL-XL) 50 MG 24 hr tablet Take 50 mg by mouth every morning.    Yes Historical Provider, MD  oxyCODONE (OXY IR/ROXICODONE) 5 MG immediate release tablet Take 2 tablets (10 mg total) by mouth every 6 (six) hours as needed. 05/17/13  Yes Thurnell Lose, MD  pantoprazole (PROTONIX) 40 MG tablet Take 1 tablet (40 mg total) by mouth 2 (two) times daily. 05/17/13  Yes Thurnell Lose, MD  promethazine (PHENERGAN) 25 MG tablet Take 25 mg by mouth every 8 (eight) hours as needed for nausea or vomiting.   Yes Historical Provider, MD  thiamine 100 MG tablet Take 1 tablet (100 mg total) by mouth daily. 05/17/13  Yes Thurnell Lose, MD   Physical Exam: Filed Vitals:   07/07/13 1615  BP: 185/84  Pulse: 50  Temp:   Resp: 17   Constitutional: Oriented to person, place, and time. Well-developed and well-nourished. Cooperative.  Head: Normocephalic and  atraumatic.  Nose: Nose normal.  Mouth/Throat: Uvula is midline, oropharynx is clear and moist and mucous membranes are normal.  Eyes: Conjunctivae and EOM are normal. Pupils are equal, round, and reactive to light.  Neck: Trachea normal and normal range of motion. Neck supple.  Cardiovascular: Normal rate, regular rhythm, S1 normal, S2 normal, normal heart sounds and intact distal pulses.   Pulmonary/Chest: Effort normal and breath sounds normal.  Abdominal: Soft. Bowel sounds are normal. There is no hepatosplenomegaly. There is no tenderness.  Musculoskeletal: Normal range of motion.  trace pedal edema  Neurological: Alert and oriented to person, place, and time. He has normal strength. No cranial nerve deficit or sensory deficit.  Skin: Skin is warm, dry and intact.  Psychiatric: Has a normal mood and affect. Speech is normal and behavior is normal.   Labs on Admission:  Basic Metabolic Panel:  Recent Labs Lab 07/07/13 1205  NA 144  K 3.7  CL 108  CO2 23  GLUCOSE 108*  BUN 13  CREATININE 0.85  CALCIUM 8.3*   Liver Function  Tests:  Recent Labs Lab 07/07/13 1214  AST 17  ALT 9  ALKPHOS 55  BILITOT 0.6  PROT 8.0  ALBUMIN 3.1*    Recent Labs Lab 07/07/13 1214  LIPASE 9*   No results found for this basename: AMMONIA,  in the last 168 hours CBC:  Recent Labs Lab 07/07/13 1205  WBC 3.6*  HGB 10.9*  HCT 34.8*  MCV 88.1  PLT 133*   Cardiac Enzymes: No results found for this basename: CKTOTAL, CKMB, CKMBINDEX, TROPONINI,  in the last 168 hours  BNP (last 3 results)  Recent Labs  07/07/13 1205  PROBNP 5002.0*   CBG:  Recent Labs Lab 07/07/13 1211  GLUCAP 81    Radiological Exams on Admission: Dg Chest Port 1 View  07/07/2013   CLINICAL DATA:  Chest pain  EXAM: PORTABLE CHEST - 1 VIEW  COMPARISON:  05/05/2013  FINDINGS: Cardiac shadow is enlarged. Vascular congestion consistent with CHF is noted. No focal infiltrate is seen. No bony abnormality is  noted.  IMPRESSION: Changes consistent with congestive failure.   Electronically Signed   By: Inez Catalina M.D.   On: 07/07/2013 13:14    EKG: Independently reviewed.   Assessment/Plan Principal Problem:   Acute on chronic combined systolic and diastolic heart failure Active Problems:   Atrial fibrillation   Nonischemic cardiomyopathy   Chronic pancreatitis   Morbid obesity    Acute on chronic systolic CHF -Patient has dilated nonischemic cardiomyopathy with LVEF of 20-25%. -Patient placed on cardiac diet, low sodium with fluid restriction. -Daily weight, check intake and output. -Lasix IV, followup on BMP closely.  Atrial fibrillation -Patient has history of atrial fibrillation controlled with metoprolol and digoxin. -Check digoxin level. -Has bradycardia when he came in to the hospital, will decrease metoprolol dose to 25 mg. -No anticoagulation, on aspirin, we will consult cardiology in a.m.  Chronic pancreatitis -Patient has chronic pain from abdominal incisional hernia rather than flareup of pancreatitis now. -Continue home oral pancreatic enzymes.  Morbid obesity -Patient counseled extensively, to lose weight. -Patient reported he gained weight after he was started on insulin.  Code Status: Full code Family Communication: Plan discussed with the patient Disposition Plan: Inpatient, telemetry.  Time spent: 70 minutes  [facility] [facility] Pager [phone]

## 2013-07-07 NOTE — ED Notes (Signed)
I advised Dr. Alvino Chapel of the patient's heart rate is dropping to the 30's.  Patient's pain level has dropped from a 5/10 to 4/10.  Laqueta Linden said patient patient was asleep and snoring when she went in there.  The patient's heart rate drops to the 30's and comes back up to 110's.

## 2013-07-08 ENCOUNTER — Encounter (HOSPITAL_COMMUNITY): Payer: Self-pay | Admitting: Cardiology

## 2013-07-08 DIAGNOSIS — J9600 Acute respiratory failure, unspecified whether with hypoxia or hypercapnia: Secondary | ICD-10-CM

## 2013-07-08 LAB — BASIC METABOLIC PANEL
BUN: 11 mg/dL (ref 6–23)
CALCIUM: 8.7 mg/dL (ref 8.4–10.5)
CO2: 26 mEq/L (ref 19–32)
CREATININE: 0.86 mg/dL (ref 0.50–1.35)
Chloride: 105 mEq/L (ref 96–112)
GFR calc Af Amer: >90 mL/min (ref >90)
GLUCOSE: 107 mg/dL — AB (ref 70–99)
Potassium: 3.6 mEq/L — ABNORMAL LOW (ref 3.7–5.3)
Sodium: 144 mEq/L (ref 137–147)

## 2013-07-08 LAB — GLUCOSE, CAPILLARY
GLUCOSE-CAPILLARY: 130 mg/dL — AB (ref 70–99)
GLUCOSE-CAPILLARY: 157 mg/dL — AB (ref 70–99)
GLUCOSE-CAPILLARY: 95 mg/dL (ref 70–99)
GLUCOSE-CAPILLARY: 125 mg/dL — AB (ref 70–99)

## 2013-07-08 LAB — CBC
HEMATOCRIT: 38.7 % — AB (ref 39.0–52.0)
Hemoglobin: 12 g/dL — ABNORMAL LOW (ref 13.0–17.0)
MCH: 27.5 pg (ref 26.0–34.0)
MCHC: 31 g/dL (ref 30.0–36.0)
MCV: 88.8 fL (ref 78.0–100.0)
Platelets: 156 10*3/uL (ref 150–400)
RBC: 4.36 MIL/uL (ref 4.22–5.81)
RDW: 15.3 % (ref 11.5–15.5)
WBC: 4.3 10*3/uL (ref 4.0–10.5)

## 2013-07-08 LAB — TSH: TSH: 2.54 u[IU]/mL (ref 0.350–4.500)

## 2013-07-08 LAB — PRO B NATRIURETIC PEPTIDE: Pro B Natriuretic peptide (BNP): 3045 pg/mL — ABNORMAL HIGH (ref 0–125)

## 2013-07-08 MED ORDER — GLUCOSE 40 % PO GEL
ORAL | Status: AC
  Filled 2013-07-08: qty 1

## 2013-07-08 MED ORDER — HYDROMORPHONE HCL 2 MG PO TABS
2.0000 mg | ORAL_TABLET | ORAL | Status: DC | PRN
  Administered 2013-07-08: 2 mg via ORAL
  Administered 2013-07-08: 4 mg via ORAL
  Administered 2013-07-09 – 2013-07-11 (×10): 2 mg via ORAL
  Filled 2013-07-08: qty 1
  Filled 2013-07-08 (×2): qty 2
  Filled 2013-07-08: qty 1
  Filled 2013-07-08: qty 2
  Filled 2013-07-08 (×6): qty 1

## 2013-07-08 MED ORDER — HYDROCODONE-ACETAMINOPHEN 5-325 MG PO TABS
2.0000 | ORAL_TABLET | Freq: Four times a day (QID) | ORAL | Status: DC | PRN
  Filled 2013-07-08 (×2): qty 2

## 2013-07-08 MED ORDER — POTASSIUM CHLORIDE CRYS ER 20 MEQ PO TBCR
40.0000 meq | EXTENDED_RELEASE_TABLET | Freq: Two times a day (BID) | ORAL | Status: AC
  Administered 2013-07-08: 40 meq via ORAL
  Filled 2013-07-08 (×2): qty 2

## 2013-07-08 MED ORDER — LISINOPRIL 5 MG PO TABS
5.0000 mg | ORAL_TABLET | Freq: Every day | ORAL | Status: DC
  Administered 2013-07-08 – 2013-07-11 (×4): 5 mg via ORAL
  Filled 2013-07-08 (×5): qty 1

## 2013-07-08 MED ORDER — OXYCODONE HCL 5 MG PO TABS
10.0000 mg | ORAL_TABLET | Freq: Four times a day (QID) | ORAL | Status: DC | PRN
  Administered 2013-07-08 – 2013-07-11 (×9): 10 mg via ORAL
  Filled 2013-07-08 (×10): qty 2

## 2013-07-08 NOTE — Progress Notes (Signed)
Client requests frequent pain medication.  Most of the time he is asleep.  No edema noted.  He is harsh and displays intrusive behavior towards anyone attempting to provide care for him.

## 2013-07-08 NOTE — Progress Notes (Signed)
[facility] PROGRESS NOTE LATE ENTRY: PT SEEN AT 2:50 Don Brown MVH:[id number] DOB: 08/21/1955 DOA: 07/07/2013 PCP: PROVIDER NOT IN SYSTEM Called to pt's room by charge nurse to evaluate pt's request for IV Dilaudid for his chronic pancreatitis. I had prolonged discussion with pt in presence of Nurse(Mona) and pt's daughter. He believes that since he has an IV in place all his medications or especially his pain meds should be administered through the IV. We talked about indications for IV meds, and specifically that IV pain meds are not indicated for chronic pain when pt can take PO meds. He complained of severe headache and I offered PO meds for relief and explained that the IV narcotics can cause rebound headaches. Pt relates that he had fallen and would like his head x-rayed. I recommended starting PO dilaudid( as he states it is dilaudid that usually works for him), and will d/c vicodin. He will continue to have oxycodone available as needed. Will also obtain head CT to further eval headaches. He agrees to this plan, adding he has no choice. Will follow up with pt in am. [place] [facility] Pager [phone]. If 7PM-7AM, please contact night-coverage at [email or web address], password [facility] 07/08/2013, 7:32 PM  LOS: 1 day

## 2013-07-08 NOTE — Progress Notes (Signed)
UR Completed Nareh Matzke Graves-Bigelow, RN,BSN [phone]  

## 2013-07-08 NOTE — Care Management Note (Addendum)
    Page 1 of 2   07/11/2013     11:37:07 AM CARE MANAGEMENT NOTE 07/11/2013  Patient:  [facility]   Account Number:  [id number]  Date Initiated:  07/08/2013  Documentation initiated by:  GRAVES-BIGELOW,BRENDA  Subjective/Objective Assessment:   Pt admitted for SOB- Treating for CHF. Initiated on IV lasix.     Action/Plan:   CM will continue to monitor for disposition needs.   Anticipated DC Date:  07/11/2013   Anticipated DC Plan:  [place]  CM consult      PAC Choice  DURABLE MEDICAL EQUIPMENT   Choice offered to / List presented to:     DME arranged  [facility]      DME agency  [facility]        Status of service:  In process, will continue to follow Medicare Important Message given?   (If response is "NO", the following Medicare IM given date fields will be blank) Date Medicare IM given:   Date Additional Medicare IM given:    Discharge Disposition:    Per UR Regulation:  Reviewed for med. necessity/level of care/duration of stay  If discussed at [place] of Stay Meetings, dates discussed:    Comments:  07/11/13 [place], RN, BSN, [place] [phone] Spoke [place], SW for [place].  Information faxed to [facility] in [place] for oxygen delivery.  Will notify RN when oxygen will be delivered to pt room.  Lonzo Cloud Mock will also make pt follow-up PCP appt and notify pt.  07/10/2013 3:35 pm Nurse Braulio Conte updates that patient has been d/c and will need home oxygen. [facility] contacted [facility] Lind Guest [phone] who confirms unable to process d/t late notice and will need to handle and process in the am.  States unable to get oxygen delivered this late in the day.  States understood from last update that patient would remain IP 2-3 days. States contact assigned SW, Tylene Fantasia in the am for assistance [phone].  States eligibility will need to be determined and if meets eligibility will  order from provider located in [place]. CM will notify assigned CM, Shaunee Mulkern of this update Crystal Hutchinson RN, BSN, MSHL, CCM 07/10/2013 (Coverage Partner for Fuller Mandril)  07/10/13 Fuller Mandril, RN, BSN, [place] [phone] NCM consulted to arrange PCP follow-up visit.  Called WS [place]; process is that pt PCP RN will call him day after discharge home to set up visit.  PCP: Ave Filter

## 2013-07-08 NOTE — Progress Notes (Signed)
Pt. Requested pain medication for headache. RN in the room to give PRN Oxycodone. Pt. Refused medication. Stated he wanted Dilaudid. Dilaudid due at 0457am. Pt. Stated he wants to wait until he can have "IV pain med".

## 2013-07-08 NOTE — Plan of Care (Addendum)
Problem: Phase I Progression Outcomes Goal: Pain controlled with appropriate interventions Outcome: Not Progressing Pain medication change noted.  See orders. Client is verbally abusive over not being given IV Dilaudid and refuses meds.

## 2013-07-08 NOTE — Progress Notes (Signed)
Patient requested PRN for headache, rating 10/10.  Offered patient vicodin, which he agreed to take.  When patient provided with vicodin, he refused to take it, stating "it makes me nauseous".  Offered PRN zofran, which patient refused.  Patient requested MD, but refused to speak with Dr. Aileen Fass, requesting a different MD so he could be seen "right now".  Explained to patient that a new doctor would not be able to see him immediately, but that RN had initiated a request for a different MD for him.  Patient became vulgar, stating "you're a fucking cunt, I'm tired of bitches like you.  I'm not going take fucking pills from cunts like you.  Find me a new fucking doctor right now."  Patient proceeded to gesture inappropriately with his middle fingers and told RN to "get out of my room".  Security called and present in room.  Per Animal nutritionist, patient did not recall being verbally abusive.  Will continue to monitor.

## 2013-07-08 NOTE — Progress Notes (Addendum)
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 - Cont low sodium, low sodium with fluid restriction.  - Daily weight, check intake and output.  - Lasix IV, followup on BMP closely. - The pt relates he wanted IV pain medications. I reinforce that he had dizzy and IV pain medications will not be indicated. - after checking his blood glucose for dizzy he express that I was not taking appropriate care of him and I was harassing him. He then express he wanted another MD became belligerent.   Atrial fibrillation  - Patient has history of atrial fibrillation controlled with metoprolol and digoxin.  - Check digoxin level.  - Has bradycardia when he came in to the hospital, will decrease metoprolol dose to 25 mg.  - Chad2s score 3.  Morbid obesity: - counseling.  Chronic pancreatitis - Lipase 9, d/c dilaudid. - Cont oral narcotics.   Code Status: full Family Communication: none  Disposition Plan: inpaitnet   Consultants:  cardiology  Procedures: ECHO: none  Antibiotics:  none (indicate start date, and stop date if known)  HPI/Subjective: dizzy  Objective: Filed Vitals:   07/07/13 1841 07/07/13 1913 07/07/13 2129 07/08/13 0637  BP:  156/93  160/89  Pulse: 68 69  104  Temp:  98 F (36.7 C) 98.9 F (37.2 C) 99.1 F (37.3 C)  TempSrc:  Oral Oral Oral  Resp: 22 20  20   Height:  6' (1.829 m)    Weight:  141.7 kg (312 lb 6.3 oz)  141.9 kg (312 lb 13.3 oz)  SpO2: 98% 96%  95%     Exam:  General: Alert, awake,  oriented x3, refused.   Data Reviewed: Basic Metabolic Panel:  Recent Labs Lab 07/07/13 1205 07/08/13 0447  NA 144 144  K 3.7 3.6*  CL 108 105  CO2 23 26  GLUCOSE 108* 107*  BUN 13 11  CREATININE 0.85 0.86  CALCIUM 8.3* 8.7   Liver Function Tests:  Recent Labs Lab 07/07/13 1214  AST 17  ALT 9  ALKPHOS 55  BILITOT 0.6  PROT 8.0  ALBUMIN 3.1*    Recent Labs Lab 07/07/13 1214  LIPASE 9*   No results found for this basename: AMMONIA,  in the last 168 hours CBC:  Recent Labs Lab 07/07/13 1205 07/08/13 0447  WBC 3.6* 4.3  HGB 10.9* 12.0*  HCT 34.8* 38.7*  MCV 88.1 88.8  PLT 133* 156   Cardiac Enzymes: No results found for this basename: CKTOTAL, CKMB, CKMBINDEX, TROPONINI,  in the last 168 hours BNP (last 3 results)  Recent Labs  07/07/13 1205 07/08/13 0447  PROBNP 5002.0* 3045.0*   CBG:  Recent Labs Lab 07/07/13 1211 07/07/13 2045 07/08/13 0656  GLUCAP 81 79 130*    No results found for this or any previous visit (from the past 240 hour(s)).   Studies: Dg Chest Port 1 View  07/07/2013   CLINICAL DATA:  Chest pain  EXAM: PORTABLE CHEST -  1 VIEW  COMPARISON:  05/05/2013  FINDINGS: Cardiac shadow is enlarged. Vascular congestion consistent with CHF is noted. No focal infiltrate is seen. No bony abnormality is noted.  IMPRESSION: Changes consistent with congestive failure.   Electronically Signed   By: Inez Catalina M.D.   On: 07/07/2013 13:14    Scheduled Meds: . aspirin EC  81 mg Oral QAC breakfast  . digoxin  125 mcg Oral Q breakfast  . folic acid  1 mg Oral Daily  . furosemide  40 mg Intravenous BID  . heparin  5,000 Units Subcutaneous 3 times per day  . insulin aspart  0-15 Units Subcutaneous TID WC  . insulin aspart  0-5 Units Subcutaneous QHS  . lipase/protease/amylase  2 capsule Oral TID WC  . metoprolol succinate  25 mg Oral Daily  . pantoprazole  40 mg Oral BID  . potassium chloride  40 mEq Oral BID  . sodium chloride  3 mL  Intravenous Q12H   Continuous Infusions:    Charlynne Cousins  [facility] Pager [phone] If 8PM-8AM, please contact night-coverage at [email or web address], password [facility] 07/08/2013, 8:45 AM  LOS: 1 day

## 2013-07-08 NOTE — Progress Notes (Signed)
PT Cancellation Note  Patient Details Name: Don Brown MRN: [id number] DOB: December 06, 1955   Cancelled Treatment:    Reason Eval/Treat Not Completed: Pain limiting ability to participate;Patient declined, no reason specified.  Patient reports having headache and needs pain meds.  "Do you think I need to get OOB when I'm have a headache and dizzy?  PT is not what I need.  I need my pain meds."  RN contacted.  Will return tomorrow to try PT eval again if patient will agree.   [name] 07/08/2013, 4:12 PM [name] Sanjuana Kava, [place] Pager [phone]

## 2013-07-08 NOTE — Plan of Care (Addendum)
Problem: Consults Goal: Heart Failure Patient Education (See Patient Education module for education specifics.)  Outcome: Not Met (add Reason) Not receptive to education.  Awaiting new MD to evaluate and speak with client.   1420  He is asleep at this time and being monitored with frequent rounding and camera.  55 Dr. Clayton Bibles in to examine client.  Client c/o headache , refuses med's.  States, " has a problem with Dr. Olevia Bowens treatment related to pancreatitic problem.  Has allergy he says to Dilaudid.  "Last time I was in here he made me NPO and would not feed me."  Will not give me the Dilaudid.  States, " just wants to give me pills."  Want med's put back the way they were.    Per  Dr. Clayton Bibles. , "what do you do for your headaches?"   Has taken the information back to getting IV dilaudid.    MD- medications by mouth for chronic pain.  Chronic pain for awhile.  Nausea.  Has been eating and because he did not get peaches.    Patient, " when things are at the extreme they give me dilaudid." " Some young doctor just wants it to be his way," per patient.  Constantly in and out of hospital for the chronic problem.per patient.  MD is listening closely and attempting.    Client is continues to display intrusive behavior, although, MD explains that she will not give IV Dilaudid.  Offered po.o. Dilaudid, but, does not want oral.  Bringing in the color of skin and thinks MD has a hidden agenda.    He continues to demand IV med's and does not want po.o. MD is attempting to rectify situation and client is insisting on continuing  IV med's.  Daughter wanted to know what is chronic vs. Acute.  She is concerned that pills is not effective because he has to wait on it to work.  MD attempting to explaining why med's are given at intervals.    Pain level is determined by the doctor. " Feels that Dr. Olevia Bowens is abusing is authority."   Treatment of pain is based on pain level was what his present MD told the daughter.  1759  Client refused have CT done as ordered, after informing the physician he had fallen and that could be the reason for headache.  After being given the dilaudid mg po., he was given Oxycodone IR 10 mg for continued c/o headache.   Sleeping between medications.

## 2013-07-08 NOTE — Progress Notes (Signed)
Call into 3e17 by MD to listen to what patient said. Patient order the doctor to leave his room, he does not want  DR. Feliz as his doctor. Also patient request to see CN this afternoon, went to room patient put his daughter on phone, trying to explain to the daughter what is going but daughter  will not listen to me, she was yelling and threatening to bring police and news coverage to the floor, she also demand to speak to somebody above the CN. AC notified. AC called the daughter, risk management involved. Called security to the floor because patient is verbally abusive to the staff. Awaiting another doctor to see patient.Will continue to monitor patient.

## 2013-07-09 DIAGNOSIS — I5023 Acute on chronic systolic (congestive) heart failure: Secondary | ICD-10-CM

## 2013-07-09 LAB — GLUCOSE, CAPILLARY
GLUCOSE-CAPILLARY: 100 mg/dL — AB (ref 70–99)
GLUCOSE-CAPILLARY: 144 mg/dL — AB (ref 70–99)
Glucose-Capillary: 131 mg/dL — ABNORMAL HIGH (ref 70–99)
Glucose-Capillary: 117 mg/dL — ABNORMAL HIGH (ref 70–99)

## 2013-07-09 LAB — BASIC METABOLIC PANEL
BUN: 10 mg/dL (ref 6–23)
CO2: 25 mEq/L (ref 19–32)
CREATININE: 0.76 mg/dL (ref 0.50–1.35)
Calcium: 9.2 mg/dL (ref 8.4–10.5)
Chloride: 102 mEq/L (ref 96–112)
Glucose, Bld: 97 mg/dL (ref 70–99)
POTASSIUM: 3.5 meq/L — AB (ref 3.7–5.3)
Sodium: 142 mEq/L (ref 137–147)

## 2013-07-09 MED ORDER — FUROSEMIDE 10 MG/ML IJ SOLN
40.0000 mg | Freq: Once | INTRAMUSCULAR | Status: AC
  Administered 2013-07-09: 40 mg via INTRAVENOUS

## 2013-07-09 MED ORDER — INSULIN GLARGINE 100 UNIT/ML ~~LOC~~ SOLN
5.0000 [IU] | Freq: Every day | SUBCUTANEOUS | Status: DC
  Administered 2013-07-09 – 2013-07-10 (×2): 5 [IU] via SUBCUTANEOUS
  Filled 2013-07-09 (×3): qty 0.05

## 2013-07-09 NOTE — Progress Notes (Signed)
PT Cancellation Note  Patient Details Name: Don Brown MRN: [id number] DOB: Jan 08, 1956   Cancelled Treatment:    Reason Eval/Treat Not Completed: Patient declined, no reason specified.  Discussed recent events with RN who advises against attempting evaluation with patient given refusal of medical interventions and verbal abuse of staff.  Will sign off case and encourage re-consultation if pt remains hospitalized, demonstrates inability to mobilize and voices agreement with PT evaluation.  Nothing further to offer at this time.  Thanks,    Herbie Drape 07/09/2013, 5:00 PM

## 2013-07-09 NOTE — Plan of Care (Cosign Needed)
Pt has been refusing both potassium replacement and follow up labs. RN called to make medical staff aware. The pt has also pulled off his telemetry leads and refused monitoring  Erin Hearing, ANP

## 2013-07-09 NOTE — Plan of Care (Signed)
Problem: Consults Goal: Tobacco Cessation referral if indicated Outcome: Progressing States he doesn't smoke at this time

## 2013-07-09 NOTE — Progress Notes (Signed)
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  - Cont low sodium, low sodium with fluid restriction.  - Daily weight, check intake and output -about 1L out over past 24hrs, no sig change in weight will continue IV lasix today and give extra dose>>cr remains wnl - followup on BMP closely.   Atrial fibrillation  -  atrial fibrillation controlled with metoprolol and digoxin.  -digoxin level 0.5 on admission.  - Had bradycardia on admission, so metoprolol dose was decreased to 25 mg on 4/18.  - Chad2s score 3, but has been on ASA only follow up with crds-Dr Martinique his outpt cardiologist for ?anticoagualtion/further recommendations Morbid obesity:  - counseling.  Chronic pancreatitis  - Lipase 9, iv dilaudid was dc'ed on 4/18.  - Cont oral narcotics. -tolerating po well  Code Status:full Family Communication: none at bedside, spoke with daughter on 4/18 Disposition Plan: to home when medically ready   Consultants:  none  Procedures:  none  Antibiotics:  none  HPI/Subjective: States no further PND, still DOE but better. Tolerating po well, no N/V  Objective: Filed Vitals:   07/09/13 0954  BP: 158/68  Pulse: 76  Temp:   Resp:     Intake/Output Summary (Last 24 hours) at 07/09/13 1009 Last data filed at 07/09/13 5176  Gross per 24 hour  Intake    240 ml  Output   1250 ml  Net  -1010 ml   Filed Weights   07/07/13 1141 07/07/13 1913 07/08/13 0637  Weight: 136.079 kg (300 lb) 141.7 kg (312 lb 6.3 oz) 141.9 kg (312 lb 13.3 oz)    Exam:  General: alert & oriented x 3 In NAD Cardiovascular: RRR, nl S1 s2 Respiratory: Decreased BS at bases, few crackles Abdomen: soft +BS NT/ND, no masses palpable Extremities: No cyanosis and no  edema    Data Reviewed: Basic Metabolic Panel:  Recent Labs Lab 07/07/13 1205 07/08/13 0447 07/09/13 0422  NA 144 144 142  K 3.7 3.6* 3.5*  CL 108 105 102  CO2 23 26 25   GLUCOSE 108* 107* 97  BUN 13 11 10   CREATININE 0.85 0.86 0.76  CALCIUM 8.3* 8.7 9.2   Liver Function Tests:  Recent Labs Lab 07/07/13 1214  AST 17  ALT 9  ALKPHOS 55  BILITOT 0.6  PROT 8.0  ALBUMIN 3.1*    Recent Labs Lab 07/07/13 1214  LIPASE 9*   No results found for this basename: AMMONIA,  in the last 168 hours CBC:  Recent Labs Lab 07/07/13 1205 07/08/13 0447  WBC 3.6* 4.3  HGB 10.9* 12.0*  HCT 34.8* 38.7*  MCV 88.1 88.8  PLT 133* 156   Cardiac Enzymes: No results found for this basename: CKTOTAL, CKMB, CKMBINDEX, TROPONINI,  in the last 168 hours BNP (last 3 results)  Recent Labs  07/07/13 1205 07/08/13 0447  PROBNP 5002.0* 3045.0*   CBG:  Recent Labs Lab 07/08/13 0656 07/08/13 0935 07/08/13 1555 07/08/13 2220 07/09/13 0645  GLUCAP 130* 157* 95 125* 100*    No results found for this or any previous visit (from the past 240 hour(s)).   Studies: Dg Chest Port 1 View  07/07/2013   CLINICAL DATA:  Chest pain  EXAM: PORTABLE CHEST - 1 VIEW  COMPARISON:  05/05/2013  FINDINGS: Cardiac shadow is enlarged. Vascular congestion  consistent with CHF is noted. No focal infiltrate is seen. No bony abnormality is noted.  IMPRESSION: Changes consistent with congestive failure.   Electronically Signed   By: Inez Catalina M.D.   On: 07/07/2013 13:14    Scheduled Meds: . aspirin EC  81 mg Oral QAC breakfast  . digoxin  125 mcg Oral Q breakfast  . folic acid  1 mg Oral Daily  . furosemide  40 mg Intravenous BID  . heparin  5,000 Units Subcutaneous 3 times per day  . insulin aspart  0-15 Units Subcutaneous TID WC  . insulin aspart  0-5 Units Subcutaneous QHS  . lipase/protease/amylase  2 capsule Oral TID WC  . lisinopril  5 mg Oral Daily  . metoprolol succinate  25 mg Oral  Daily  . pantoprazole  40 mg Oral BID  . sodium chloride  3 mL Intravenous Q12H   Continuous Infusions:   Principal Problem:   Acute on chronic combined systolic and diastolic heart failure Active Problems:   Atrial fibrillation   Nonischemic cardiomyopathy   Chronic pancreatitis   Morbid obesity    Time spent: [place] [facility] Pager [phone]. If 7PM-7AM, please contact night-coverage at [email or web address], password [facility] 07/09/2013, 10:09 AM  LOS: 2 days

## 2013-07-09 NOTE — Plan of Care (Signed)
Problem: Phase I Progression Outcomes Goal: EF % per last Echo/documented,Core Reminder form on chart Outcome: Completed/Met Date Met:  07/09/13 EF 20-25%(05-01-13)

## 2013-07-09 NOTE — Progress Notes (Addendum)
Refusing to put telemetry back on.  MD texted and CCMD notified.  0850 Telemetry discontinued after client refused to wear as ordered.  MD gave permission to dc and CCMD notified.

## 2013-07-10 LAB — BASIC METABOLIC PANEL
BUN: 20 mg/dL (ref 6–23)
CHLORIDE: 101 meq/L (ref 96–112)
CO2: 29 meq/L (ref 19–32)
Calcium: 9.6 mg/dL (ref 8.4–10.5)
Creatinine, Ser: 0.98 mg/dL (ref 0.50–1.35)
GFR calc non Af Amer: 89 mL/min — ABNORMAL LOW (ref >90)
Glucose, Bld: 119 mg/dL — ABNORMAL HIGH (ref 70–99)
Potassium: 3.5 mEq/L — ABNORMAL LOW (ref 3.7–5.3)
Sodium: 144 mEq/L (ref 137–147)

## 2013-07-10 LAB — GLUCOSE, CAPILLARY
GLUCOSE-CAPILLARY: 136 mg/dL — AB (ref 70–99)
Glucose-Capillary: 129 mg/dL — ABNORMAL HIGH (ref 70–99)
Glucose-Capillary: 149 mg/dL — ABNORMAL HIGH (ref 70–99)
Glucose-Capillary: 164 mg/dL — ABNORMAL HIGH (ref 70–99)

## 2013-07-10 MED ORDER — LISINOPRIL 5 MG PO TABS: 5.0000 mg | ORAL_TABLET | Freq: Every day | ORAL | Status: DC

## 2013-07-10 MED ORDER — METOPROLOL SUCCINATE ER 25 MG PO TB24: 25.0000 mg | ORAL_TABLET | ORAL | Status: DC

## 2013-07-10 MED ORDER — FUROSEMIDE 40 MG PO TABS: 40.0000 mg | ORAL_TABLET | Freq: Two times a day (BID) | ORAL | Status: DC

## 2013-07-10 NOTE — Progress Notes (Signed)
Pt.is A/Ox4 and is ambulatory with 1 standby assist and walker. He had c/o abdominal pain and headache pain during the shift and prn pain medication was given. Pt.was ambulated from one end of the the hall to the other and back to his room. Oxygen saturation was 86% on room air. During the walk the pt.c/o SOB and had to stop and rest.  At rest his oxygen saturation is 88-90% on room air.

## 2013-07-10 NOTE — Progress Notes (Signed)
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  - Cont low sodium, low sodium with fluid restriction.  - Daily weight, check intake and output -a little over 1L out over past 24hrs,and down 5.1kgfrom admission weight -cr remains stable,continue lasix  -mild hypokalemia noted, but pt declines -plan ws to d/c today on home O2 as he meets criteria but [facility] only [facility] in am, so will pla d/c in am Atrial fibrillation  - rate controlled with metoprolol and digoxin.  -digoxin level 0.5 on admission.  - Had bradycardia on admission, so metoprolol dose was decreased to 25 mg on 4/18.  - Chad2s score 3, but has been on ASA only follow up with crds-Dr [place] his outpt cardiologist for ?anticoagualtion/further recommendations Morbid obesity:  - counseling.  Chronic pancreatitis  - Lipase 9, iv dilaudid was dc'ed on 4/18.  - Cont oral narcotics. -tolerating po well  Code Status:full Family Communication: none at bedside, spoke with daughter on 4/18 Disposition Plan: to home when medically ready   Consultants:  none  Procedures:  none  Antibiotics:  none  HPI/Subjective: States breathing better today, wants to go home  Objective: Filed Vitals:   07/10/13 0941  BP: 123/75  Pulse: 91  Temp: 98.2 F (36.8 C)  Resp: 18    Intake/Output Summary (Last 24 hours) at 07/10/13 0955 Last data filed at 07/10/13 0901  Gross per 24 hour  Intake    483 ml  Output   1675 ml  Net  -1192 ml   Filed Weights   07/07/13 1913 07/08/13 0637 07/10/13 0449  Weight: 141.7 kg (312 lb 6.3 oz) 141.9 kg (312 lb 13.3 oz) 136.6 kg (301 lb 2.4 oz)    Exam:  General: alert & oriented x 3 In NAD Cardiovascular: RRR, nl S1 s2 Respiratory:  Decreased BS at bases, no crackles Abdomen: soft +BS NT/ND, no masses palpable Extremities: No cyanosis and no edema    Data Reviewed: Basic Metabolic Panel:  Recent Labs Lab 07/07/13 1205 07/08/13 0447 07/09/13 0422 07/10/13 0418  NA 144 144 142 144  K 3.7 3.6* 3.5* 3.5*  CL 108 105 102 101  CO2 23 26 25 29   GLUCOSE 108* 107* 97 119*  BUN 13 11 10 20   CREATININE 0.85 0.86 0.76 0.98  CALCIUM 8.3* 8.7 9.2 9.6   Liver Function Tests:  Recent Labs Lab 07/07/13 1214  AST 17  ALT 9  ALKPHOS 55  BILITOT 0.6  PROT 8.0  ALBUMIN 3.1*    Recent Labs Lab 07/07/13 1214  LIPASE 9*   No results found for this basename: AMMONIA,  in the last 168 hours CBC:  Recent Labs Lab 07/07/13 1205 07/08/13 0447  WBC 3.6* 4.3  HGB 10.9* 12.0*  HCT 34.8* 38.7*  MCV 88.1 88.8  PLT 133* 156   Cardiac Enzymes: No results found for this basename: CKTOTAL, CKMB, CKMBINDEX, TROPONINI,  in the last 168 hours BNP (last 3 results)  Recent Labs  07/07/13 1205 07/08/13 0447  PROBNP 5002.0* 3045.0*   CBG:  Recent Labs Lab 07/09/13 0645 07/09/13 1108 07/09/13 1553 07/09/13 2120 07/10/13 0645  GLUCAP 100* 131* 144* 117* 129*    No results found for this or any previous visit (from the past 240 hour(s)).   Studies:  No results found.  Scheduled Meds: . aspirin EC  81 mg Oral QAC breakfast  . digoxin  125 mcg Oral Q breakfast  . folic acid  1 mg Oral Daily  . furosemide  40 mg Intravenous BID  . heparin  5,000 Units Subcutaneous 3 times per day  . insulin aspart  0-15 Units Subcutaneous TID WC  . insulin aspart  0-5 Units Subcutaneous QHS  . insulin glargine  5 Units Subcutaneous QHS  . lipase/protease/amylase  2 capsule Oral TID WC  . lisinopril  5 mg Oral Daily  . metoprolol succinate  25 mg Oral Daily  . pantoprazole  40 mg Oral BID  . sodium chloride  3 mL Intravenous Q12H   Continuous Infusions:   Principal Problem:   Acute on chronic combined systolic and  diastolic heart failure Active Problems:   Atrial fibrillation   Nonischemic cardiomyopathy   Chronic pancreatitis   Morbid obesity    Time spent: [place] [facility] Pager [phone]. If 7PM-7AM, please contact night-coverage at [email or web address], password [facility] 07/10/2013, 9:55 AM  LOS: 3 days

## 2013-07-10 NOTE — Progress Notes (Signed)
SATURATION QUALIFICATIONS: (This note is used to comply with regulatory documentation for home oxygen)  Patient Saturations on Room Air at Rest = 89%  Patient Saturations on Room Air while Ambulating = 86%  Patient Saturations on 2 Liters of oxygen while Ambulating = 94%  Please briefly explain why patient needs home oxygen: Patient has SOB when ambulating. His oxygen saturation is also 86% on room air.

## 2013-07-11 DIAGNOSIS — E119 Type 2 diabetes mellitus without complications: Secondary | ICD-10-CM

## 2013-07-11 LAB — BASIC METABOLIC PANEL
BUN: 28 mg/dL — AB (ref 6–23)
CHLORIDE: 99 meq/L (ref 96–112)
CO2: 28 mEq/L (ref 19–32)
Calcium: 9 mg/dL (ref 8.4–10.5)
Creatinine, Ser: 1.08 mg/dL (ref 0.50–1.35)
GFR calc non Af Amer: 74 mL/min — ABNORMAL LOW (ref >90)
GFR, EST AFRICAN AMERICAN: 86 mL/min — AB (ref >90)
GLUCOSE: 113 mg/dL — AB (ref 70–99)
POTASSIUM: 3.5 meq/L — AB (ref 3.7–5.3)
SODIUM: 142 meq/L (ref 137–147)

## 2013-07-11 LAB — GLUCOSE, CAPILLARY
GLUCOSE-CAPILLARY: 135 mg/dL — AB (ref 70–99)
Glucose-Capillary: 119 mg/dL — ABNORMAL HIGH (ref 70–99)
Glucose-Capillary: 160 mg/dL — ABNORMAL HIGH (ref 70–99)
Glucose-Capillary: 109 mg/dL — ABNORMAL HIGH (ref 70–99)

## 2013-07-11 MED ORDER — FUROSEMIDE 40 MG PO TABS
40.0000 mg | ORAL_TABLET | Freq: Two times a day (BID) | ORAL | Status: DC
  Administered 2013-07-11: 40 mg via ORAL
  Filled 2013-07-11 (×3): qty 1

## 2013-07-11 NOTE — Progress Notes (Signed)
Spoke to [place] regarding oxygen delivery for pt.  Case worker states that oxygen will be delivered to pt home and daughter will have to be there to receive and bring tank to [facility] for pt transport home.  Information relayed to [facility], RN; pt and pt daughter.  All verbalize understanding of process. Elaina Cara J. Clydene Laming, [place], [place], [employer] [phone].

## 2013-07-11 NOTE — Progress Notes (Signed)
Patient discharged to home.  No complaints of pain.  AVS teaching done, no further questions.  IV removed. Shellee Milo, RN

## 2013-07-11 NOTE — Progress Notes (Signed)
0700-1900 Pt.is A/Ox4 and is ambulatory without assistance. He is dependent on  oxygen at 2 L [place]. He had c/o abdominal pain and prn pain medication was given. Spoke with case management and physician all throughout the day about patient's home oxygen. Spoke with day shift case Nature conservation officer and was told that a company through the [place] would deliver his home oxygen at home today and that a family member would need to go to his home, pick up the oxygen, and bring it to the hospital so that he would have it available to use at discharge. Around 1656 was informed by patient that his daughter was at home but that the oxygen had no been delivered yet. Called the day shift case manager but she was unavailable . Then called case manager on call Gregary Signs. Gregary Signs was able to contact day shift case manager about oxygen. Was told that day shift case manager spoke with the patient's daughter earlier today and told her that the company said  would deliver the home oxygen around 7 pm. Around 1935 was notified by patient that his daughter said his oxygen hadn't arrived yet. Reported to night shift charge nurse about oxygen. On call case manager contacted and notified.

## 2013-07-11 NOTE — Progress Notes (Signed)
Nutrition Education Note  RD consulted for nutrition education. Pt on Heart Healthy/Carb Modified diet. Low sodium diet for acute on chronic combined systolic and diastolic heart failure. Pt received diet education ragarding carbohydrate modified diet during previou admission.   RD provided "Low Sodium Nutrition Therapy" handout from the Academy of [employer]. Reviewed patient's dietary recall. Provided examples on ways to decrease sodium intake in diet. Discouraged intake of processed foods and use of salt shaker. Encouraged fresh fruits and vegetables as well as whole grain sources of carbohydrates to maximize fiber intake.   RD discussed why it is important for patient to adhere to diet recommendations, and emphasized the role of fluids, foods to avoid, and importance of weighing self daily. Provided thirst quenching tips. Teach back method used. Pt able to identify several high sodium foods in his diet that he will limit/avoid and/or find a low sodium version of- deli meats, soups, chips, crackers, american cheese, and salad dressings.   Expect good compliance.  Body mass index is 41.37 kg/(m^2). Pt meets criteria for morbid obesity based on current BMI.  Current diet order is Carb Modified/Heart Healthy diet, patient is consuming approximately 75-100% of meals at this time. Labs and medications reviewed. No further nutrition interventions warranted at this time. RD contact information provided. If additional nutrition issues arise, please re-consult RD.   Pryor Ochoa RD, LDN Inpatient Clinical Dietitian Pager: [phone] After Hours Pager: [phone]

## 2013-07-11 NOTE — Progress Notes (Signed)
Information faxed to [facility] for oxygen delivery.  Will notify RN when oxygen will be delivered to pt room.

## 2013-07-11 NOTE — Discharge Summary (Addendum)
Physician Discharge Summary  Don Brown ZOX:[id number] DOB: [birth year] DOA: 07/07/2013  PCP: PROVIDER NOT IN SYSTEM  Admit date: 07/07/2013 Discharge date: 07/11/2013  Time spent: >30 minutes  Recommendations for Outpatient Follow-up:  Follow-up Information   Please follow up. (PCP at the [facility], this week call for appt as directed upon discharge)       Follow up with Peter Martinique, MD On 07/10/2013. (NEXT WEEK, call for appt upon discharge )    Specialty:  Cardiology   Contact information:   [address] [place] [address] [phone]        Discharge Diagnoses:  Principal Problem:   Acute on chronic combined systolic and diastolic heart failure Active Problems:   Atrial fibrillation   Nonischemic cardiomyopathy   Chronic pancreatitis   Morbid obesity   Discharge Condition: improved/stable  Diet recommendation: hear healthy/modified Carb  Filed Weights   07/08/13 0637 07/10/13 0449 07/11/13 0544  Weight: 141.9 kg (312 lb 13.3 oz) 136.6 kg (301 lb 2.4 oz) 138.392 kg (305 lb 1.6 oz)    History of present illness:  Pt is a 58 y.o. African American male with past medical history of CHF with EF of 20-25%, diabetes. T1 morbid obesity. Patient came in to the hospital because of shortness of breath. Patient said for the  2-3 days prior to admission he started to have shortness of breath initially with exertion and started to get worse, he also endorsed some orthopnea and nonproductive cough. He came in to the hospital for further evaluation.  In the ED his findings was consistent with acute CHF exacerbation with BNP of 5000, chest x-ray showed some vascular congestion consistent with CHF. Patient admitted to the hospital for further evaluation.   Hospital Course:  Acute on chronic combined systolic and diastolic heart failure - Dilated nonischemic cardiomyopathy with LVEF of 20-25%, on 2.9.2015.  -As discussed above upon admission pt was started on IV lasix with  close monitoring of daily weights and I&Os. -He was also placed on a low sodium, with fluid restriction.  -His BNP was decreasing and he was down 5lbs his wt from admission on 4/20, although noted to be trending up today. His BUN was up this am and he is clinically improved>> lasix changed to PO today, increase from his previous prn to BID scheduled -He desated to 86% with ambulation on room air, and thus meets criteria for home O2.and CM to assist with obtaining of the Home O2 from the [place] -he was maintained on B-blocker and low dose ACEI added which he is to continue on d/c -Pt is to follow up with his PCP at [place] this week and with Cards Dr Atrial fibrillation  - rate controlled with metoprolol and digoxin.  -digoxin level 0.5 on admission.  - Had bradycardia on admission, so metoprolol dose was decreased to 25 mg on 4/18.  - Chad2s score 3, but has been on ASA only, he is to follow up with crds-Dr Martinique his outpt cardiologist for ?anticoagualtion/further recommendations  Morbid obesity:  - counseling on lifestyle changes Chronic pancreatitis  - Lipase 9, on admssion received iv dilaudid was dc'ed on 4/18 following admission.  - Cont oral narcotics as previously   Procedures:  none  Consultations:  none  Discharge Exam: Filed Vitals:   07/11/13 1421  BP: 106/64  Pulse: 86  Temp: 98.5 F (36.9 C)  Resp: 18   Exam:  General: alert & oriented x 3 In NAD  Cardiovascular: RRR,  nl S1 s2  Respiratory: Decreased BS at bases, no crackles  Abdomen: soft +BS NT/ND, no masses palpable  Extremities: No cyanosis and no edema    Discharge Instructions You were cared for by a hospitalist during your hospital stay. If you have any questions about your discharge medications or the care you received while you were in the hospital after you are discharged, you can call the unit and asked to speak with the hospitalist on call if the hospitalist that took care of you is not available. Once  you are discharged, your primary care physician will handle any further medical issues. Please note that NO REFILLS for any discharge medications will be authorized once you are discharged, as it is imperative that you return to your primary care physician (or establish a relationship with a primary care physician if you do not have one) for your aftercare needs so that they can reassess your need for medications and monitor your lab values.  Discharge Orders   Future Orders Complete By Expires   Diet - low sodium heart healthy  As directed    Discharge instructions  As directed    Increase activity slowly  As directed        Medication List         aspirin 81 MG tablet  Take 81 mg by mouth daily before breakfast.     digoxin 0.125 MG tablet  Commonly known as:  LANOXIN  Take 125 mcg by mouth every morning.     folic acid 1 MG tablet  Commonly known as:  FOLVITE  Take 1 tablet (1 mg total) by mouth daily.     furosemide 40 MG tablet  Commonly known as:  LASIX  Take 1 tablet (40 mg total) by mouth 2 (two) times daily. Fluid     glucose monitoring kit monitoring kit  1 each by Does not apply route 4 (four) times daily - after meals and at bedtime. 1 month Diabetic Testing Supplies for QAC-QHS accuchecks.Any brand OK     GREEN TEA PO  Take 2 tablets by mouth daily.     HYDROcodone-acetaminophen 5-325 MG per tablet  Commonly known as:  NORCO/VICODIN  Take 1 tablet by mouth every 6 (six) hours as needed. Pain     insulin aspart 100 UNIT/ML injection  Commonly known as:  NOVOLOG  - Before each meal 3 times a day and then bedtime, 140-199 - 2 units, 200-250 - 4 units, 251-299 - 6 units,  300-349 - 8 units,  350 or above 10 units.  - Dispense syringes and needles as needed, Ok to switch to [employer] if approved. Okay to switch to any approved short-acting insulin.     lisinopril 5 MG tablet  Commonly known as:  PRINIVIL,ZESTRIL  Take 1 tablet (5 mg total) by mouth daily.      metoprolol succinate 25 MG 24 hr tablet  Commonly known as:  TOPROL-XL  Take 1 tablet (25 mg total) by mouth every morning.     oxyCODONE 5 MG immediate release tablet  Commonly known as:  Oxy IR/ROXICODONE  Take 2 tablets (10 mg total) by mouth every 6 (six) hours as needed.     PANCREASE PO  Take 2 capsules by mouth 3 (three) times daily with meals.     pantoprazole 40 MG tablet  Commonly known as:  PROTONIX  Take 1 tablet (40 mg total) by mouth 2 (two) times daily.     promethazine 25 MG tablet  Commonly known as:  PHENERGAN  Take 25 mg by mouth every 8 (eight) hours as needed for nausea or vomiting.     thiamine 100 MG tablet  Take 1 tablet (100 mg total) by mouth daily.     VITAMIN B 12 PO  Take 2 tablets by mouth daily.       Allergies  Allergen Reactions  . Morphine And Related Nausea And Vomiting       Follow-up Information   Please follow up. (PCP at the [facility], this week call for appt as directed upon discharge)       Follow up with Peter Martinique, MD On 07/10/2013. (NEXT WEEK, call for appt upon discharge )    Specialty:  Cardiology   Contact information:   [address] [place] [address] [phone]        The results of significant diagnostics from this hospitalization (including imaging, microbiology, ancillary and laboratory) are listed below for reference.    Significant Diagnostic Studies: Dg Chest Port 1 View  07/07/2013   CLINICAL DATA:  Chest pain  EXAM: PORTABLE CHEST - 1 VIEW  COMPARISON:  05/05/2013  FINDINGS: Cardiac shadow is enlarged. Vascular congestion consistent with CHF is noted. No focal infiltrate is seen. No bony abnormality is noted.  IMPRESSION: Changes consistent with congestive failure.   Electronically Signed   By: Inez Catalina M.D.   On: 07/07/2013 13:14    Microbiology: No results found for this or any previous visit (from the past 240 hour(s)).   Labs: Basic Metabolic Panel:  Recent Labs Lab 07/07/13 1205  07/08/13 0447 07/09/13 0422 07/10/13 0418 07/11/13 0520  NA 144 144 142 144 142  K 3.7 3.6* 3.5* 3.5* 3.5*  CL 108 105 102 101 99  CO2 '23 26 25 29 28  ' GLUCOSE 108* 107* 97 119* 113*  BUN '13 11 10 20 ' 28*  CREATININE 0.85 0.86 0.76 0.98 1.08  CALCIUM 8.3* 8.7 9.2 9.6 9.0   Liver Function Tests:  Recent Labs Lab 07/07/13 1214  AST 17  ALT 9  ALKPHOS 55  BILITOT 0.6  PROT 8.0  ALBUMIN 3.1*    Recent Labs Lab 07/07/13 1214  LIPASE 9*   No results found for this basename: AMMONIA,  in the last 168 hours CBC:  Recent Labs Lab 07/07/13 1205 07/08/13 0447  WBC 3.6* 4.3  HGB 10.9* 12.0*  HCT 34.8* 38.7*  MCV 88.1 88.8  PLT 133* 156   Cardiac Enzymes: No results found for this basename: CKTOTAL, CKMB, CKMBINDEX, TROPONINI,  in the last 168 hours BNP: BNP (last 3 results)  Recent Labs  07/07/13 1205 07/08/13 0447  PROBNP 5002.0* 3045.0*   CBG:  Recent Labs Lab 07/10/13 1057 07/10/13 1555 07/10/13 2124 07/11/13 0553 07/11/13 1107  GLUCAP 136* 149* 164* 119* 160*       Signed:  Baruc Tugwell C Alyzae Hawkey  [facility] 07/11/2013, 2:55 PM

## 2013-10-03 ENCOUNTER — Inpatient Hospital Stay (HOSPITAL_COMMUNITY)
Admission: EM | Admit: 2013-10-03 | Discharge: 2013-10-06 | DRG: 438 | Disposition: A | Discharge status: Home / Self Care | Payer: Non-veteran care | Attending: Oncology | Admitting: Oncology

## 2013-10-03 ENCOUNTER — Inpatient Hospital Stay (HOSPITAL_COMMUNITY): Payer: Non-veteran care

## 2013-10-03 ENCOUNTER — Encounter (HOSPITAL_COMMUNITY): Payer: Self-pay | Admitting: Emergency Medicine

## 2013-10-03 DIAGNOSIS — E119 Type 2 diabetes mellitus without complications: Secondary | ICD-10-CM | POA: Diagnosis present

## 2013-10-03 DIAGNOSIS — Z7982 Long term (current) use of aspirin: Secondary | ICD-10-CM

## 2013-10-03 DIAGNOSIS — D472 Monoclonal gammopathy: Secondary | ICD-10-CM | POA: Diagnosis present

## 2013-10-03 DIAGNOSIS — D638 Anemia in other chronic diseases classified elsewhere: Secondary | ICD-10-CM | POA: Diagnosis present

## 2013-10-03 DIAGNOSIS — I509 Heart failure, unspecified: Secondary | ICD-10-CM | POA: Diagnosis present

## 2013-10-03 DIAGNOSIS — E785 Hyperlipidemia, unspecified: Secondary | ICD-10-CM | POA: Diagnosis present

## 2013-10-03 DIAGNOSIS — M3390 Dermatopolymyositis, unspecified, organ involvement unspecified: Secondary | ICD-10-CM

## 2013-10-03 DIAGNOSIS — M3313 Other dermatomyositis without myopathy: Secondary | ICD-10-CM | POA: Diagnosis present

## 2013-10-03 DIAGNOSIS — D696 Thrombocytopenia, unspecified: Secondary | ICD-10-CM | POA: Diagnosis present

## 2013-10-03 DIAGNOSIS — K861 Other chronic pancreatitis: Secondary | ICD-10-CM | POA: Diagnosis present

## 2013-10-03 DIAGNOSIS — K859 Acute pancreatitis without necrosis or infection, unspecified: Secondary | ICD-10-CM | POA: Insufficient documentation

## 2013-10-03 DIAGNOSIS — I5043 Acute on chronic combined systolic (congestive) and diastolic (congestive) heart failure: Secondary | ICD-10-CM | POA: Diagnosis present

## 2013-10-03 DIAGNOSIS — Z794 Long term (current) use of insulin: Secondary | ICD-10-CM

## 2013-10-03 DIAGNOSIS — I10 Essential (primary) hypertension: Secondary | ICD-10-CM | POA: Diagnosis present

## 2013-10-03 DIAGNOSIS — G8929 Other chronic pain: Secondary | ICD-10-CM | POA: Diagnosis present

## 2013-10-03 DIAGNOSIS — Z6841 Body Mass Index (BMI) 40.0 and over, adult: Secondary | ICD-10-CM

## 2013-10-03 DIAGNOSIS — I428 Other cardiomyopathies: Secondary | ICD-10-CM | POA: Diagnosis present

## 2013-10-03 DIAGNOSIS — I4891 Unspecified atrial fibrillation: Secondary | ICD-10-CM | POA: Diagnosis present

## 2013-10-03 LAB — CBC WITH DIFFERENTIAL/PLATELET
Basophils Absolute: 0 10*3/uL (ref 0.0–0.1)
Basophils Relative: 0 % (ref 0–1)
Eosinophils Absolute: 0.1 10*3/uL (ref 0.0–0.7)
Eosinophils Relative: 1 % (ref 0–5)
HCT: 38.4 % — ABNORMAL LOW (ref 39.0–52.0)
Hemoglobin: 12.4 g/dL — ABNORMAL LOW (ref 13.0–17.0)
Lymphocytes Relative: 8 % — ABNORMAL LOW (ref 12–46)
Lymphs Abs: 0.6 10*3/uL — ABNORMAL LOW (ref 0.7–4.0)
MCH: 27.1 pg (ref 26.0–34.0)
MCHC: 32.3 g/dL (ref 30.0–36.0)
MCV: 84 fL (ref 78.0–100.0)
Monocytes Absolute: 0.9 10*3/uL (ref 0.1–1.0)
Monocytes Relative: 12 % (ref 3–12)
Neutro Abs: 5.7 10*3/uL (ref 1.7–7.7)
Neutrophils Relative %: 79 % — ABNORMAL HIGH (ref 43–77)
Platelets: 116 10*3/uL — ABNORMAL LOW (ref 150–400)
RBC: 4.57 MIL/uL (ref 4.22–5.81)
RDW: 15.3 % (ref 11.5–15.5)
WBC: 7.3 10*3/uL (ref 4.0–10.5)

## 2013-10-03 LAB — ETHANOL: Alcohol, Ethyl (B): <11 mg/dL (ref 0–11)

## 2013-10-03 LAB — RAPID URINE DRUG SCREEN, HOSP PERFORMED
AMPHETAMINES: NOT DETECTED
BENZODIAZEPINES: NOT DETECTED
Barbiturates: NOT DETECTED
COCAINE: NOT DETECTED
Opiates: POSITIVE — AB
TETRAHYDROCANNABINOL: NOT DETECTED

## 2013-10-03 LAB — COMPREHENSIVE METABOLIC PANEL
ALT: 10 U/L (ref 0–53)
AST: 16 U/L (ref 0–37)
Albumin: 3.3 g/dL — ABNORMAL LOW (ref 3.5–5.2)
Alkaline Phosphatase: 58 U/L (ref 39–117)
Anion gap: 13 (ref 5–15)
BUN: 14 mg/dL (ref 6–23)
CO2: 25 mEq/L (ref 19–32)
Calcium: 8.6 mg/dL (ref 8.4–10.5)
Chloride: 106 mEq/L (ref 96–112)
Creatinine, Ser: 0.76 mg/dL (ref 0.50–1.35)
GFR calc Af Amer: >90 mL/min (ref >90)
GFR calc non Af Amer: >90 mL/min (ref >90)
Glucose, Bld: 121 mg/dL — ABNORMAL HIGH (ref 70–99)
Potassium: 3.8 mEq/L (ref 3.7–5.3)
Sodium: 144 mEq/L (ref 137–147)
Total Bilirubin: 0.5 mg/dL (ref 0.3–1.2)
Total Protein: 8.5 g/dL — ABNORMAL HIGH (ref 6.0–8.3)

## 2013-10-03 LAB — CBC
HCT: 35.5 % — ABNORMAL LOW (ref 39.0–52.0)
Hemoglobin: 11.1 g/dL — ABNORMAL LOW (ref 13.0–17.0)
MCH: 26.8 pg (ref 26.0–34.0)
MCHC: 31.3 g/dL (ref 30.0–36.0)
MCV: 85.7 fL (ref 78.0–100.0)
Platelets: 90 10*3/uL — ABNORMAL LOW (ref 150–400)
RBC: 4.14 MIL/uL — AB (ref 4.22–5.81)
RDW: 15.8 % — AB (ref 11.5–15.5)
WBC: 6.9 10*3/uL (ref 4.0–10.5)

## 2013-10-03 LAB — HEMOGLOBIN A1C
HEMOGLOBIN A1C: 6.7 % — AB (ref <5.7)
Mean Plasma Glucose: 146 mg/dL — ABNORMAL HIGH (ref <117)

## 2013-10-03 LAB — CREATININE, SERUM
CREATININE: 0.77 mg/dL (ref 0.50–1.35)
GFR calc Af Amer: >90 mL/min (ref >90)
GFR calc non Af Amer: >90 mL/min (ref >90)

## 2013-10-03 LAB — GLUCOSE, CAPILLARY
GLUCOSE-CAPILLARY: 119 mg/dL — AB (ref 70–99)
Glucose-Capillary: 100 mg/dL — ABNORMAL HIGH (ref 70–99)

## 2013-10-03 LAB — LIPASE, BLOOD: Lipase: 704 U/L — ABNORMAL HIGH (ref 11–59)

## 2013-10-03 MED ORDER — ONDANSETRON HCL 4 MG/2ML IJ SOLN
4.0000 mg | Freq: Once | INTRAMUSCULAR | Status: AC
  Administered 2013-10-03: 4 mg via INTRAVENOUS
  Filled 2013-10-03: qty 2

## 2013-10-03 MED ORDER — HYDROMORPHONE HCL PF 1 MG/ML IJ SOLN
1.0000 mg | Freq: Once | INTRAMUSCULAR | Status: AC
  Administered 2013-10-03: 1 mg via INTRAVENOUS
  Filled 2013-10-03: qty 1

## 2013-10-03 MED ORDER — ONDANSETRON HCL 4 MG/2ML IJ SOLN
4.0000 mg | Freq: Four times a day (QID) | INTRAMUSCULAR | Status: DC | PRN
  Administered 2013-10-04 – 2013-10-06 (×2): 4 mg via INTRAVENOUS
  Filled 2013-10-03 (×2): qty 2

## 2013-10-03 MED ORDER — SODIUM CHLORIDE 0.9 % IJ SOLN
3.0000 mL | Freq: Two times a day (BID) | INTRAMUSCULAR | Status: DC
  Administered 2013-10-04 – 2013-10-06 (×4): 3 mL via INTRAVENOUS

## 2013-10-03 MED ORDER — ONDANSETRON HCL 4 MG PO TABS: 4.0000 mg | ORAL_TABLET | Freq: Four times a day (QID) | ORAL | Status: DC | PRN

## 2013-10-03 MED ORDER — INSULIN ASPART 100 UNIT/ML ~~LOC~~ SOLN
0.0000 [IU] | Freq: Three times a day (TID) | SUBCUTANEOUS | Status: DC
  Administered 2013-10-05: 2 [IU] via SUBCUTANEOUS

## 2013-10-03 MED ORDER — FAMOTIDINE IN NACL 20-0.9 MG/50ML-% IV SOLN
20.0000 mg | Freq: Once | INTRAVENOUS | Status: AC
  Administered 2013-10-03: 20 mg via INTRAVENOUS
  Filled 2013-10-03: qty 50

## 2013-10-03 MED ORDER — SODIUM CHLORIDE 0.9 % IV SOLN
INTRAVENOUS | Status: DC
  Administered 2013-10-03: 09:00:00 via INTRAVENOUS

## 2013-10-03 MED ORDER — HYDROMORPHONE HCL PF 1 MG/ML IJ SOLN
1.0000 mg | INTRAMUSCULAR | Status: DC | PRN
  Administered 2013-10-03 – 2013-10-06 (×13): 1 mg via INTRAVENOUS
  Filled 2013-10-03 (×13): qty 1

## 2013-10-03 MED ORDER — HEPARIN SODIUM (PORCINE) 5000 UNIT/ML IJ SOLN
5000.0000 [IU] | Freq: Three times a day (TID) | INTRAMUSCULAR | Status: DC
  Filled 2013-10-03: qty 1

## 2013-10-03 MED ORDER — SODIUM CHLORIDE 0.9 % IV SOLN
INTRAVENOUS | Status: DC
  Administered 2013-10-03: 100 mL/h via INTRAVENOUS
  Administered 2013-10-04: 04:00:00 via INTRAVENOUS

## 2013-10-03 NOTE — H&P (Signed)
Patient refusing to complete medical history .

## 2013-10-03 NOTE — ED Provider Notes (Signed)
CSN: [id number]     Arrival date & time 10/03/13  2248 History   First MD Initiated Contact with Patient 10/03/13 [phone]     Chief Complaint  Patient presents with  . Abdominal Pain     (Consider location/radiation/quality/duration/timing/severity/associated sxs/prior Treatment) HPI  "I have chronic pancreatitis and I need pain medication." Reports upper abdominal pain radiating to back. Onset late last night and progressively worsening. Feels similar to previous pancreatitis. Nausea and multiple episodes of vomiring. Does have past hx of pancreatitis with pseudocyst formation and previous pancreatic stents. No fever or chills. No diarrhea. No sick contacts. No urinary complaints.   Past Medical History  Diagnosis Date  . 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 Atrial fibrillation   . Chronic systolic heart failure     LVEF 40-45%  . Morbid obesity   . Chronic pain   . Narcotic dependence, episodic use   . Respiratory failure     History of ventilatory-dependent respiratory failure and tracheostomy   . History of renal failure     Requiring hemodialysis in the past with normal renal function at this point  . History of ventral hernia repair   . Cervical compression fracture     Cervical disk disease requiring surgery  . Essential hypertension, benign   . Hyperlipidemia   . Degenerative joint disease   . Heart murmur   . MGUS (monoclonal gammopathy of unknown significance)   . Type 2 diabetes mellitus    Past Surgical History  Procedure Laterality Date  . Ventral hernia repair    . Cholecystectomy    . Tracheostomy    . Pancreas surgery    . Skin graft    . Insertion of mesh     Family History  Problem Relation Age of Onset  . Hypertension Mother   . Diabetes Mother   . Lymphoma Father    History  Substance Use Topics  . Smoking  status: Never Smoker   . Smokeless tobacco: Not on file  . Alcohol Use: No    Review of Systems  All systems reviewed and negative, other than as noted in HPI.   Allergies  Morphine and related  Home Medications   Prior to Admission medications   Medication Sig Start Date End Date Taking? Authorizing Provider  Amylase-Lipase-Protease (PANCREASE PO) Take 2 capsules by mouth 3 (three) times daily with meals.     Historical Provider, MD  aspirin 81 MG tablet Take 81 mg by mouth daily before breakfast.     Historical Provider, MD  Cyanocobalamin (VITAMIN B 12 PO) Take 2 tablets by mouth daily.     Historical Provider, MD  digoxin (LANOXIN) 0.125 MG tablet Take 125 mcg by mouth every morning.     Historical Provider, MD  folic acid (FOLVITE) 1 MG tablet Take 1 tablet (1 mg total) by mouth daily. 05/17/13   Thurnell Lose, MD  furosemide (LASIX) 40 MG tablet Take 1 tablet (40 mg total) by mouth 2 (two) times daily. Fluid 07/10/13   Sheila Oats, MD  glucose monitoring kit (FREESTYLE) monitoring kit 1 each by Does not apply route 4 (four) times daily - after meals and at bedtime. 1 month Diabetic Testing Supplies for QAC-QHS accuchecks.Any brand OK 05/17/13   Thurnell Lose, MD  Nyoka Cowden Tea, Camillia sinensis, (GREEN TEA PO) Take 2 tablets by mouth  daily.     Historical Provider, MD  HYDROcodone-acetaminophen (NORCO/VICODIN) 5-325 MG per tablet Take 1 tablet by mouth every 6 (six) hours as needed. Pain 05/17/13   Thurnell Lose, MD  insulin aspart (NOVOLOG) 100 UNIT/ML injection Before each meal 3 times a day and then bedtime, 140-199 - 2 units, 200-250 - 4 units, 251-299 - 6 units,  300-349 - 8 units,  350 or above 10 units. Dispense syringes and needles as needed, Ok to switch to PEN if approved. Okay to switch to any approved short-acting insulin. 05/17/13   Thurnell Lose, MD  lisinopril (PRINIVIL,ZESTRIL) 5 MG tablet Take 1 tablet (5 mg total) by mouth daily. 07/10/13   Sheila Oats, MD  metoprolol succinate (TOPROL-XL) 25 MG 24 hr tablet Take 1 tablet (25 mg total) by mouth every morning. 07/10/13   Sheila Oats, MD  oxyCODONE (OXY IR/ROXICODONE) 5 MG immediate release tablet Take 2 tablets (10 mg total) by mouth every 6 (six) hours as needed. 05/17/13   Thurnell Lose, MD  pantoprazole (PROTONIX) 40 MG tablet Take 1 tablet (40 mg total) by mouth 2 (two) times daily. 05/17/13   Thurnell Lose, MD  promethazine (PHENERGAN) 25 MG tablet Take 25 mg by mouth every 8 (eight) hours as needed for nausea or vomiting.    Historical Provider, MD  thiamine 100 MG tablet Take 1 tablet (100 mg total) by mouth daily. 05/17/13   Thurnell Lose, MD   BP 159/71  Pulse 66  Temp(Src) 98.1 F (36.7 C) (Oral)  Resp 24  Ht 6' (1.829 m)  Wt 315 lb (142.883 kg)  BMI 42.71 kg/m2  SpO2 94% Physical Exam  Nursing note and vitals reviewed. Constitutional:  Laying in bed. Appears uncomfortable. Retching. Obese.   HENT:  Head: Normocephalic and atraumatic.  Eyes: Conjunctivae are normal. Right eye exhibits no discharge. Left eye exhibits no discharge.  Neck: Neck supple.  Cardiovascular: Normal rate, regular rhythm and normal heart sounds.  Exam reveals no gallop and no friction rub.   No murmur heard. Pulmonary/Chest: Effort normal and breath sounds normal. No respiratory distress.  Abdominal: Soft. He exhibits no distension. There is tenderness.  Periumbilical/epigastric tenderness. No rebound or guarding.   Musculoskeletal: He exhibits no edema and no tenderness.  Neurological: He is alert.  Skin: Skin is warm and dry.  Psychiatric: He has a normal mood and affect. His behavior is normal. Thought content normal.    ED Course  Procedures (including critical care time) Labs Review Labs Reviewed  LIPASE, BLOOD - Abnormal; Notable for the following:    Lipase 704 (*)    All other components within normal limits  COMPREHENSIVE METABOLIC PANEL - Abnormal; Notable for the  following:    Glucose, Bld 121 (*)    Total Protein 8.5 (*)    Albumin 3.3 (*)    All other components within normal limits  CBC WITH DIFFERENTIAL - Abnormal; Notable for the following:    Hemoglobin 12.4 (*)    HCT 38.4 (*)    Platelets 116 (*)    Neutrophils Relative % 79 (*)    Lymphocytes Relative 8 (*)    Lymphs Abs 0.6 (*)    All other components within normal limits  URINE RAPID DRUG SCREEN (HOSP PERFORMED) - Abnormal; Notable for the following:    Opiates POSITIVE (*)    All other components within normal limits  CBC - Abnormal; Notable for the following:    RBC  4.14 (*)    Hemoglobin 11.1 (*)    HCT 35.5 (*)    RDW 15.8 (*)    Platelets 90 (*)    All other components within normal limits  HEMOGLOBIN A1C - Abnormal; Notable for the following:    Hemoglobin A1C 6.7 (*)    Mean Plasma Glucose 146 (*)    All other components within normal limits  GLUCOSE, CAPILLARY - Abnormal; Notable for the following:    Glucose-Capillary 119 (*)    All other components within normal limits  COMPREHENSIVE METABOLIC PANEL - Abnormal; Notable for the following:    Potassium 3.6 (*)    Total Protein 8.4 (*)    Albumin 3.1 (*)    All other components within normal limits  GLUCOSE, CAPILLARY - Abnormal; Notable for the following:    Glucose-Capillary 100 (*)    All other components within normal limits  GLUCOSE, CAPILLARY - Abnormal; Notable for the following:    Glucose-Capillary 101 (*)    All other components within normal limits  CBC - Abnormal; Notable for the following:    Hemoglobin 11.2 (*)    HCT 37.6 (*)    MCHC 29.8 (*)    RDW 16.2 (*)    Platelets 106 (*)    All other components within normal limits  CREATININE, SERUM  ETHANOL  GLUCOSE, CAPILLARY  GLUCOSE, CAPILLARY  GLUCOSE, CAPILLARY    Imaging Review No results found.   EKG Interpretation   Date/Time:  Tuesday October 03 2013 08:29:56 EDT Ventricular Rate:  59 PR Interval:    QRS Duration: 111 QT  Interval:  421 QTC Calculation: 417 R Axis:   25 Text Interpretation:  Atrial fibrillation Abnormal R-wave progression,  early transition Nonspecific T abnrm, anterolateral leads ED PHYSICIAN  INTERPRETATION AVAILABLE IN [facility] Confirmed by TEST, Record  ([id number]) on 10/05/2013 7:38:11 AM      MDM   Final diagnoses:  Chronic pancreatitis, unspecified pancreatitis type        Virgel Manifold, MD 10/05/13 [phone]

## 2013-10-03 NOTE — ED Notes (Signed)
Sharee Pimple, NT transporting patient upstairs.

## 2013-10-03 NOTE — ED Notes (Signed)
Patient transported by [facility] with complaint of left upper quadrant pain starting approximately 3-4 hours ago.  Patient has history of pancreatitis.  Patient also complaining of nausea but denies any vomiting or diarrhea.  EMS reports CBG-119, and reports patient was initially short of breath but improved with 2L oxygen per nasal cannula.  EMS also reported patient also complained of left lower back and left flank pain.

## 2013-10-03 NOTE — H&P (Signed)
Date: 10/03/2013               Patient Name:  Don Brown MRN: [id number]  DOB: May 30, 1955 Age / Sex: 58 y.o., male   PCP: Provider Not In System         Medical Service: Internal Medicine Teaching Service         Attending Physician: Dr. Annia Belt, MD    First Contact: Katherene Ponto  Pager: [phone]  Second Contact: Dr. Lottie Mussel  Pager: [phone]   Third Contact:                    Dr. Clinton Gallant  Pager:  [phone]     After Hours (After 5p/  First Contact Pager: [phone]  weekends / holidays): Second Contact Pager: [phone]   Chief Complaint: acute on chronic pancreatitis  History of Present Illness: Don Brown is a 58 yo man with PMH of CHF with EF of 20-25%, chronic pancreatitis, diabetes, morbid obesity, chronic pain, narcotic dependency, who presented to the hospital with left upper quadrant pain that started this morning. He describes the pain as sharp, 6/10, radiating to the back. Activity and fast food worsen the pain. He has nausea and vomiting, but denies fever, chills, and diarrhea. Pt was initially short of breath but improved with 2L nasal cannula. EMS reports pt complained of left lower back and flank pain. He had a CBG of 119. Patient refused to answer questions on admission, saying that he "had already answer them" and to "look in the chart".  Of note patient has been admitted to the hospital twice previously this year. The first was for [facility] with acute pancreatitis  in Feb when he had a glucose of 900 with lactic acidosis and was intubated for respiratory failure. The second was in April for with worsening dyspnea on exertion and orthopnea. He responded well to IV lasix and was started on low dose lisinopril in addition to his metoprolol.   Per Dr. Rosanne Sack note on on 01/29/13, pt  has a history of open surgery for pancreatitis, pseudocyst, and possible partial pancreatectomy at [facility] in [place]. He also has history of pancreatic duct  stent placement that was removed a few  years ago.   For his diabetes he takes sliding scale insulin aspart. His Afib is rate controlled with the metoprolol and digoxin. He is not on any anticoagulants besides aspirin.   Meds: Current Facility-Administered Medications  Medication Dose Route Frequency Provider Last Rate Last Dose  . 0.9 %  sodium chloride infusion   Intravenous Continuous Clinton Gallant, MD 100 mL/hr at 10/03/13 1518 100 mL/hr at 10/03/13 1518  . HYDROmorphone (DILAUDID) injection 1 mg  1 mg Intravenous Q3H PRN Clinton Gallant, MD   1 mg at 10/03/13 1622  . insulin aspart (novoLOG) injection 0-15 Units  0-15 Units Subcutaneous TID WC Clinton Gallant, MD      . ondansetron [facility]) tablet 4 mg  4 mg Oral Q6H PRN Clinton Gallant, MD       Or  . ondansetron [facility]) injection 4 mg  4 mg Intravenous Q6H PRN Clinton Gallant, MD      . sodium chloride 0.9 % injection 3 mL  3 mL Intravenous Q12H Clinton Gallant, MD        Allergies: Allergies as of 10/03/2013 - Review Complete 10/03/2013  Allergen Reaction Noted  . Morphine and related Nausea And Vomiting 01/13/2011   Past Medical History  Diagnosis Date  .  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 Atrial fibrillation   . Chronic systolic heart failure     LVEF 40-45%  . Morbid obesity   . Chronic pain   . Narcotic dependence, episodic use   . Respiratory failure     History of ventilatory-dependent respiratory failure and tracheostomy   . History of renal failure     Requiring hemodialysis in the past with normal renal function at this point  . History of ventral hernia repair   . Cervical compression fracture     Cervical disk disease requiring surgery  . Essential hypertension, benign   . Hyperlipidemia   . Degenerative joint disease   . Heart murmur   . MGUS (monoclonal gammopathy of unknown significance)     . Type 2 diabetes mellitus    Past Surgical History  Procedure Laterality Date  . Ventral hernia repair    . Cholecystectomy    . Tracheostomy    . Pancreas surgery    . Skin graft    . Insertion of mesh     Family History  Problem Relation Age of Onset  . Hypertension Mother   . Diabetes Mother   . Lymphoma Father    History   Social History  . Marital Status: Single    Spouse Name: N/A    Number of Children: N/A  . Years of Education: N/A   Occupational History  . Not on file.   Social History Main Topics  . Smoking status: Never Smoker   . Smokeless tobacco: Never Used  . Alcohol Use: No  . Drug Use: No  . Sexual Activity: Not on file   Other Topics Concern  . Not on file   Social History Narrative  . No narrative on file    Review of Systems: Patient refused to answer questions on admission  Physical Exam: Blood pressure 130/81, pulse 71, temperature 98.1 F (36.7 C), temperature source Oral, resp. rate 20, height 6' (1.829 m), weight 329 lb 1.6 oz (149.279 kg), SpO2 97.00%. General: Resting in bed, no acute distress, uncooperative with interview  Pt refused physical exam.   Lab results: Basic Metabolic Panel:  Recent Labs  10/03/13 0835 10/03/13 1511  NA 144  --   K 3.8  --   CL 106  --   CO2 25  --   GLUCOSE 121*  --   BUN 14  --   CREATININE 0.76 0.77  CALCIUM 8.6  --    Liver Function Tests:  Recent Labs  10/03/13 0835  AST 16  ALT 10  ALKPHOS 58  BILITOT 0.5  PROT 8.5*  ALBUMIN 3.3*    Recent Labs  10/03/13 0835  LIPASE 704*   No results found for this basename: AMMONIA,  in the last 72 hours CBC:  Recent Labs  10/03/13 0835 10/03/13 1511  WBC 7.3 6.9  NEUTROABS 5.7  --   HGB 12.4* 11.1*  HCT 38.4* 35.5*  MCV 84.0 85.7  PLT 116* 90*   Cardiac Enzymes: No results found for this basename: CKTOTAL, CKMB, CKMBINDEX, TROPONINI,  in the last 72 hours BNP: No results found for this basename: PROBNP,  in the last  72 hours D-Dimer: No results found for this basename: DDIMER,  in the last 72 hours CBG:  Recent Labs  10/03/13 1634  GLUCAP 119*   Hemoglobin A1C:  No results found for this basename: HGBA1C,  in the last 72 hours Fasting Lipid Panel: No results found for this basename: CHOL, HDL, LDLCALC, TRIG, CHOLHDL, LDLDIRECT,  in the last 72 hours Thyroid Function Tests: No results found for this basename: TSH, T4TOTAL, FREET4, T3FREE, THYROIDAB,  in the last 72 hours Anemia Panel: No results found for this basename: VITAMINB12, FOLATE, FERRITIN, TIBC, IRON, RETICCTPCT,  in the last 72 hours Coagulation: No results found for this basename: LABPROT, INR,  in the last 72 hours Urine Drug Screen: Drugs of Abuse     Component Value Date/Time   LABOPIA NONE DETECTED 04/30/2013 2145   [place] 04/30/2013 2145   COCAINSCRNUR NONE DETECTED 04/30/2013 2145   COCAINSCRNUR NONE DETECTED 04/30/2013 2145   LABBENZ NONE DETECTED 04/30/2013 2145   LABBENZ NONE DETECTED 04/30/2013 2145   AMPHETMU NONE DETECTED 04/30/2013 2145   AMPHETMU NONE DETECTED 04/30/2013 2145   THCU NONE DETECTED 04/30/2013 2145   THCU NONE DETECTED 04/30/2013 2145   LABBARB NONE DETECTED 04/30/2013 2145   LABBARB NONE DETECTED 04/30/2013 2145    Alcohol Level:  Recent Labs  10/03/13 1511  ETH <11   Urinalysis: No results found for this basename: COLORURINE, APPERANCEUR, LABSPEC, PHURINE, GLUCOSEU, HGBUR, BILIRUBINUR, KETONESUR, PROTEINUR, UROBILINOGEN, NITRITE, LEUKOCYTESUR,  in the last 72 hours  Imaging results:  No results found.  Other results: EKG: showed atrial fibrillation. No official read yet.   Assessment & Plan: Don Brown is a 58 yo man with PMH of CHF with EF of 20-25%, chronic pancreatitis, diabetes who presents to the hospital with abdominal/back pain and elevated lipase consistent with an acute exacerbation of his pancreatitis.   1. Acute on Chronic Pancreatitis- Long history of pancreatitis including  previous surgery and stent placement. Most recent CT scan in Feb showed irregular dilation of the pancreatic duct leading to an irregular cystic appearance throughout pancreatic tail. Pt takes daily amylase-lipase-protease supplement. Lipase of 704 on admission with abdominal and back pain. Shortness of breath likely due to combination of pancreatitis and heart failure. Given patient's low ejection fraction must to be careful about IV fluids.  - NPO - NS at 68ml/hr - Dilaudid 1mg  q 3 h for pain as needed - Zofran 4 mg q 4 h for nausea  2. CHF: Acute on chronic combined systolic and diastolic. Dilated nonischemic cardiomyopathy with  last LVEF of 20-25% in Feb. Shortness of breath could be attributed to volume overload. Difficult to assess volume status as patient did not cooperate with exam. - Hold metoprolol, lisinopril, and digoxin give acute pancreatitis  - Chest x-ray - Continue to monitor for JVD, dyspnea, edema with IV fluids - Daily weight w/ I&Os - 2L [place]  3. Atrial Fibrillation- Chad2 score of 3 Rate controlled with digoxin and metoprolol. Aspirin only anticoagulant.  ECG shows irregular, irregular rhythm with rate of 59. - Hold home medications - Cardiac monitoring   4. DM type 2: CBG on admission of 119. Previous admitted this year with HSS, but glucose is much better controlled today.  Pt on Aspart sliding scale at home. - SSI   5. Anemia: Hbg was 12.4 on admission which is around his baseline. Most likely secondary to anemia of chronic disease. Iron deficiency less likely given MCV of 83. - Continue to monitor   6. Thrombocytopenia: 119 on admission and down to 90 on repeat CBC. - Continue to monitor  Diet: NPO DVT/PE PPx: Heparin  Dispo: Disposition is deferred at this  time, awaiting improvement of current medical problems. Anticipated discharge in approximately 1-2 day(s).   The patient does have a current PCP (at [facility]) and does need an [facility] hospital follow-up appointment  after discharge.  The patient does not know have transportation limitations that hinder transportation to clinic appointments.  Signed: Albin Felling, MD 10/03/2013, 4:56 PM

## 2013-10-03 NOTE — ED Notes (Signed)
Patient complaint of no improvement in his pain.  Dr. Wilson Singer notified.

## 2013-10-03 NOTE — Progress Notes (Signed)
[name] [id number] Admission Data: 10/03/2013 2:14 PM Attending Provider: Annia Belt, MD  MVE:[id number] NOT IN SYSTEM Consults/ Treatment Team:    Don Brown is a 58 y.o. male patient admitted from ED awake, alert  & orientated  X 3,  Full Code, VSS - Blood pressure 132/62, pulse 85, temperature 98.1 F (36.7 C), temperature source Oral, resp. rate 12, height 6' (1.829 m), weight 142.883 kg (315 lb), SpO2 97.00%., O2    2 L nasal cannular, no c/o shortness of breath, no c/o chest pain, no distress noted. Tele # 11 placed and pt is currently running:normal sinus rhythm.   IV site WDL:  hand right, condition patent and no redness with a transparent dsg that's clean dry and intact.  Allergies:   Allergies  Allergen Reactions  . Morphine And Related Nausea And Vomiting     Past Medical History  Diagnosis Date  . 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 Atrial fibrillation   . Chronic systolic heart failure     LVEF 40-45%  . Morbid obesity   . Chronic pain   . Narcotic dependence, episodic use   . Respiratory failure     History of ventilatory-dependent respiratory failure and tracheostomy   . History of renal failure     Requiring hemodialysis in the past with normal renal function at this point  . History of ventral hernia repair   . Cervical compression fracture     Cervical disk disease requiring surgery  . Essential hypertension, benign   . Hyperlipidemia   . Degenerative joint disease   . Heart murmur   . MGUS (monoclonal gammopathy of unknown significance)   . Type 2 diabetes mellitus      Pt orientation to unit, room and routine. Information packet given to patient/family and safety video watched.  Admission INP armband ID verified with patient/family, and in place. SR up x 2, fall risk assessment complete with Patient and  family verbalizing understanding of risks associated with falls. Pt verbalizes an understanding of how to use the call bell and to call for help before getting out of bed.  Skin, clean-dry- intact without evidence of bruising, or skin tears.   No evidence of skin break down noted on exam.   Will cont to monitor and assist as needed.  Deylan Canterbury, Elissa Hefty, RN 10/03/2013 2:14 PM

## 2013-10-04 DIAGNOSIS — D696 Thrombocytopenia, unspecified: Secondary | ICD-10-CM

## 2013-10-04 DIAGNOSIS — K859 Acute pancreatitis without necrosis or infection, unspecified: Principal | ICD-10-CM

## 2013-10-04 DIAGNOSIS — D649 Anemia, unspecified: Secondary | ICD-10-CM

## 2013-10-04 DIAGNOSIS — K861 Other chronic pancreatitis: Secondary | ICD-10-CM

## 2013-10-04 DIAGNOSIS — E119 Type 2 diabetes mellitus without complications: Secondary | ICD-10-CM

## 2013-10-04 DIAGNOSIS — I509 Heart failure, unspecified: Secondary | ICD-10-CM

## 2013-10-04 DIAGNOSIS — I4891 Unspecified atrial fibrillation: Secondary | ICD-10-CM

## 2013-10-04 LAB — COMPREHENSIVE METABOLIC PANEL
ALBUMIN: 3.1 g/dL — AB (ref 3.5–5.2)
ALK PHOS: 54 U/L (ref 39–117)
ALT: 8 U/L (ref 0–53)
AST: 11 U/L (ref 0–37)
Anion gap: 13 (ref 5–15)
BUN: 11 mg/dL (ref 6–23)
CO2: 25 mEq/L (ref 19–32)
Calcium: 8.4 mg/dL (ref 8.4–10.5)
Chloride: 108 mEq/L (ref 96–112)
Creatinine, Ser: 0.76 mg/dL (ref 0.50–1.35)
GFR calc non Af Amer: >90 mL/min (ref >90)
Glucose, Bld: 94 mg/dL (ref 70–99)
POTASSIUM: 3.6 meq/L — AB (ref 3.7–5.3)
SODIUM: 146 meq/L (ref 137–147)
TOTAL PROTEIN: 8.4 g/dL — AB (ref 6.0–8.3)
Total Bilirubin: 0.5 mg/dL (ref 0.3–1.2)

## 2013-10-04 LAB — CBC
HEMATOCRIT: 37.6 % — AB (ref 39.0–52.0)
HEMOGLOBIN: 11.2 g/dL — AB (ref 13.0–17.0)
MCH: 26.2 pg (ref 26.0–34.0)
MCHC: 29.8 g/dL — ABNORMAL LOW (ref 30.0–36.0)
MCV: 87.9 fL (ref 78.0–100.0)
Platelets: 106 10*3/uL — ABNORMAL LOW (ref 150–400)
RBC: 4.28 MIL/uL (ref 4.22–5.81)
RDW: 16.2 % — ABNORMAL HIGH (ref 11.5–15.5)
WBC: 5.9 10*3/uL (ref 4.0–10.5)

## 2013-10-04 LAB — GLUCOSE, CAPILLARY
GLUCOSE-CAPILLARY: 87 mg/dL (ref 70–99)
Glucose-Capillary: 101 mg/dL — ABNORMAL HIGH (ref 70–99)
Glucose-Capillary: 95 mg/dL (ref 70–99)
Glucose-Capillary: 86 mg/dL (ref 70–99)

## 2013-10-04 MED ORDER — POTASSIUM CHLORIDE IN NACL 20-0.9 MEQ/L-% IV SOLN
INTRAVENOUS | Status: DC
  Administered 2013-10-04 – 2013-10-05 (×3): via INTRAVENOUS
  Filled 2013-10-04 (×5): qty 1000

## 2013-10-04 MED ORDER — PANTOPRAZOLE SODIUM 40 MG PO TBEC
40.0000 mg | DELAYED_RELEASE_TABLET | Freq: Two times a day (BID) | ORAL | Status: DC
  Administered 2013-10-04 – 2013-10-06 (×4): 40 mg via ORAL
  Filled 2013-10-04 (×5): qty 1

## 2013-10-04 MED ORDER — KETOROLAC TROMETHAMINE 15 MG/ML IJ SOLN
15.0000 mg | Freq: Three times a day (TID) | INTRAMUSCULAR | Status: DC | PRN
  Administered 2013-10-04: 15 mg via INTRAVENOUS
  Filled 2013-10-04 (×2): qty 1

## 2013-10-04 MED ORDER — ENOXAPARIN SODIUM 80 MG/0.8ML ~~LOC~~ SOLN
75.0000 mg | SUBCUTANEOUS | Status: DC
  Filled 2013-10-04 (×3): qty 0.8

## 2013-10-04 MED ORDER — ENOXAPARIN SODIUM 40 MG/0.4ML ~~LOC~~ SOLN: 40.0000 mg | Freq: Two times a day (BID) | SUBCUTANEOUS | Status: DC

## 2013-10-04 MED ORDER — BIOTENE DRY MOUTH MT LIQD
15.0000 mL | Freq: Two times a day (BID) | OROMUCOSAL | Status: DC
  Administered 2013-10-05 – 2013-10-06 (×2): 15 mL via OROMUCOSAL

## 2013-10-04 MED ORDER — KETOROLAC TROMETHAMINE 15 MG/ML IJ SOLN
15.0000 mg | Freq: Once | INTRAMUSCULAR | Status: AC
  Administered 2013-10-04: 15 mg via INTRAVENOUS
  Filled 2013-10-04: qty 1

## 2013-10-04 MED ORDER — ACETAMINOPHEN 325 MG PO TABS
650.0000 mg | ORAL_TABLET | Freq: Four times a day (QID) | ORAL | Status: DC | PRN
  Administered 2013-10-04 – 2013-10-05 (×4): 650 mg via ORAL
  Filled 2013-10-04 (×5): qty 2

## 2013-10-04 MED ORDER — LISINOPRIL 5 MG PO TABS
5.0000 mg | ORAL_TABLET | Freq: Every day | ORAL | Status: DC
  Administered 2013-10-04 – 2013-10-06 (×3): 5 mg via ORAL
  Filled 2013-10-04 (×3): qty 1

## 2013-10-04 MED ORDER — METOPROLOL SUCCINATE ER 25 MG PO TB24
25.0000 mg | ORAL_TABLET | Freq: Every morning | ORAL | Status: DC
  Administered 2013-10-04 – 2013-10-06 (×3): 25 mg via ORAL
  Filled 2013-10-04 (×3): qty 1

## 2013-10-04 MED ORDER — FOLIC ACID 1 MG PO TABS
1.0000 mg | ORAL_TABLET | Freq: Every day | ORAL | Status: DC
  Administered 2013-10-04 – 2013-10-06 (×3): 1 mg via ORAL
  Filled 2013-10-04 (×3): qty 1

## 2013-10-04 MED ORDER — VITAMIN B-12 100 MCG PO TABS
50.0000 ug | ORAL_TABLET | Freq: Every day | ORAL | Status: DC
  Administered 2013-10-04 – 2013-10-06 (×3): 50 ug via ORAL
  Filled 2013-10-04 (×3): qty 1

## 2013-10-04 MED ORDER — VITAMIN B-1 100 MG PO TABS
100.0000 mg | ORAL_TABLET | Freq: Every day | ORAL | Status: DC
  Administered 2013-10-04 – 2013-10-06 (×3): 100 mg via ORAL
  Filled 2013-10-04 (×3): qty 1

## 2013-10-04 MED ORDER — ASPIRIN 81 MG PO CHEW
81.0000 mg | CHEWABLE_TABLET | Freq: Every day | ORAL | Status: DC
  Administered 2013-10-04 – 2013-10-06 (×3): 81 mg via ORAL
  Filled 2013-10-04 (×3): qty 1

## 2013-10-04 MED ORDER — PANCRELIPASE (LIP-PROT-AMYL) 12000-38000 UNITS PO CPEP
2.0000 | ORAL_CAPSULE | Freq: Three times a day (TID) | ORAL | Status: DC
  Administered 2013-10-05 – 2013-10-06 (×4): 2 via ORAL
  Filled 2013-10-04 (×9): qty 2

## 2013-10-04 NOTE — H&P (Signed)
Attending physician admission note: I personally interviewed and examined this patient and reviewed laboratory and x-ray studies. History, physical findings, problem assessment and management plan, acted as recorded by resident physician Dr. Albin Felling. Clinical summary: 58 year old morbidly obese man with hypertension,  dilated cardiomyopathy, estimated ejection fraction 20-25% on echocardiogram 05/01/2013. History of rheumatic fever as a child but no valvular abnormalities on current echocardiogram. Chronic atrial fibrillation. Previous evaluation for pancytopenia with findings of a IgG monoclonal gammopathy of undetermined significance, 5% plasma cells on a bone marrow done in May 2013. Peak IgG level 4.2 g 06/30/2011 with most recent value significantly lower at 1.9 g on 05/03/2013 He has developed chronic pancreatitis secondary to initial gallstone related pancreatitis with initial surgery done approximately 2007 at [facility] in [place]. He has had multiple subsequent interventions including placement of pancreatic stents which were subsequently removed. He has had multiple admissions for acute pancreatitis. He presents at this time with acute abdominal pain which occurred coincidentally after eating fast food on a trip back from visiting [place]. Lipase is markedly elevated at 704 units compared with 9 units back in April. He has had nausea and vomiting. No fevers. He has been put at bowel rest. He is receiving parenteral hydration and narcotic analgesics. Abdominal pain has subsided overnight. Current exam: Obese African American man in no distress Blood pressure 143/92, pulse 86, temperature 98.7 F (37.1 C), temperature source Oral, resp. rate 20, height 6' (1.829 m), weight 329 lb 1.6 oz (149.279 kg), SpO2 96.00%. Systolic ejection murmur 2/6. Clear lungs Abdomen is soft, obese, multiple surgical scars. One large transverse scar mid right abdomen. Minimal tenderness to  palpation. No obvious mass or organomegaly. No fluid wave. Impression: #1. Acute exacerbation of chronic pancreatitis. #2. Idiopathic nonischemic cardiomyopathy. #3. 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 #4. IgG kappa monoclonal gammopathy of undetermined significance. Stable at time of most recent February 2015 reevaluation. Plan: As outlined in resident physician history and physical

## 2013-10-04 NOTE — Progress Notes (Signed)
Subjective: [name]. Pt had a headache this morning that was minimally relieved by the Toradol he received last night. He still feel nauseated and had at least once episode of vomiting last night. He remains on 2L of oxygen and denies any shortness of breath.   Patient reported that his pancreatitis started with gallstones "that weren't removed quickly enough" in 2007. He has since had several procedures, including pseudocyst drainage, partial pancreatectomy, and subdequent hernia repaires in [place]. He has since had pancreatic duct stents placed and then removed in various locations including the [place]. He denies ever drinking alcohol.  Objective: Vital signs in last 24 hours: Filed Vitals:   10/03/13 1403 10/03/13 2100 10/04/13 0531 10/04/13 0621  BP: 130/81 144/78 165/80 143/92  Pulse: 71 76 86   Temp: 98.1 F (36.7 C) 98 F (36.7 C) 98.7 F (37.1 C)   TempSrc: Oral Oral Oral   Resp: 20 20 20    Height: 6' (1.829 m)     Weight: 149.279 kg (329 lb 1.6 oz)     SpO2: 97% 94% 95% 96%   Weight change:   Intake/Output Summary (Last 24 hours) at 10/04/13 1146 Last data filed at 10/04/13 1000  Gross per 24 hour  Intake    560 ml  Output    580 ml  Net    -20 ml   Physical Exam: General: Well appearing, lying in bed, in no acute distress Heart: RRR, No JVD, no murmurs appreciated. Soft heart sounds secondary to obese state. Lungs: 2L oxygen, normal work of breathing, minimal crackles heard at the right base Abdominal: significant scars from previous surgery. normoactive bowel sounds, mild diffuse tenderness, no guarding or rebound. Extremities: Trace peripheral edema. Skin: no new rashes or lesions  Lab Results: Basic Metabolic Panel:  Recent Labs  10/03/13 0835 10/03/13 1511 10/04/13 0645  NA 144  --  146  K 3.8  --  3.6*  CL 106  --  108  CO2 25  --  25  GLUCOSE 121*  --  94  BUN 14  --  11  CREATININE 0.76 0.77 0.76  CALCIUM 8.6  --  8.4   Liver  Function Tests:  Recent Labs  10/03/13 0835 10/04/13 0645  AST 16 11  ALT 10 8  ALKPHOS 58 54  BILITOT 0.5 0.5  PROT 8.5* 8.4*  ALBUMIN 3.3* 3.1*    Recent Labs  10/03/13 0835  LIPASE 704*   No results found for this basename: AMMONIA,  in the last 72 hours CBC:  Recent Labs  10/03/13 0835 10/03/13 1511 10/04/13 0645  WBC 7.3 6.9 5.9  NEUTROABS 5.7  --   --   HGB 12.4* 11.1* 11.2*  HCT 38.4* 35.5* 37.6*  MCV 84.0 85.7 87.9  PLT 116* 90* 106*   Cardiac Enzymes: No results found for this basename: CKTOTAL, CKMB, CKMBINDEX, TROPONINI,  in the last 72 hours BNP: No results found for this basename: PROBNP,  in the last 72 hours D-Dimer: No results found for this basename: DDIMER,  in the last 72 hours CBG:  Recent Labs  10/03/13 1634 10/03/13 2101 10/04/13 0747 10/04/13 1157  GLUCAP 119* 100* 101* 95   Hemoglobin A1C:  Recent Labs  10/03/13 1511  HGBA1C 6.7*   Fasting Lipid Panel: No results found for this basename: CHOL, HDL, LDLCALC, TRIG, CHOLHDL, LDLDIRECT,  in the last 72 hours Thyroid Function Tests: No results found for this basename: TSH, T4TOTAL, FREET4, T3FREE, THYROIDAB,  in  the last 72 hours Anemia Panel: No results found for this basename: VITAMINB12, FOLATE, FERRITIN, TIBC, IRON, RETICCTPCT,  in the last 72 hours Coagulation: No results found for this basename: LABPROT, INR,  in the last 72 hours Urine Drug Screen: Drugs of Abuse     Component Value Date/Time   LABOPIA POSITIVE* 10/03/2013 [place] 10/03/2013 2212   LABBENZ NONE DETECTED 10/03/2013 2212   AMPHETMU NONE DETECTED 10/03/2013 2212   THCU NONE DETECTED 10/03/2013 2212   LABBARB NONE DETECTED 10/03/2013 2212    Alcohol Level:  Recent Labs  10/03/13 1511  ETH <11   Urinalysis: No results found for this basename: COLORURINE, APPERANCEUR, LABSPEC, PHURINE, GLUCOSEU, HGBUR, BILIRUBINUR, KETONESUR, PROTEINUR, UROBILINOGEN, NITRITE, LEUKOCYTESUR,  in  the last 72 hours   Micro Results: No results found for this or any previous visit (from the past 240 hour(s)). Studies/Results: Dg Chest 2 View  10/03/2013   CLINICAL DATA:  Dyspnea with mid chest pain.  EXAM: CHEST  2 VIEW  COMPARISON:  07/07/2013 and 05/05/2013 as well as 05/02/2013  FINDINGS: Lungs are somewhat hypoinflated with stable elevation of the right hemidiaphragm. Subtle linear density over the lateral left midlung and right infrahilar region likely atelectasis. Moderate stable cardiomegaly. Remainder of the exam is unchanged.  IMPRESSION: Stable moderate cardiomegaly.  Minimal bilateral linear atelectasis.   Electronically Signed   By: Marin Olp M.D.   On: 10/03/2013 16:56   Medications: I have reviewed the patient's current medications. Scheduled Meds: . antiseptic oral rinse  15 mL Mouth Rinse BID  . insulin aspart  0-15 Units Subcutaneous TID WC  . sodium chloride  3 mL Intravenous Q12H   Continuous Infusions: . sodium chloride 75 mL/hr at 10/04/13 0332   PRN Meds:.HYDROmorphone (DILAUDID) injection, ketorolac, ondansetron (ZOFRAN) IV, ondansetron Assessment/Plan: [name] is a 58 yo man with PMH of CHF with EF of 20-25%, chronic pancreatitis, diabetes who presents to the hospital with abdominal/back pain and elevated lipase consistent with an acute exacerbation of his pancreatitis.  Principal Problem:   Pancreatitis, acute Active Problems:   Atrial fibrillation   Chronic pancreatitis   CHF (congestive heart failure)   Morbid obesity   DM (dermatomyositis)   Acute pancreatitis  1. Acute on Chronic Pancreatitis- Long history of pancreatitis including previous surgery and stent placement. Most recent CT scan in Feb showed irregular dilation of the pancreatic duct leading to an irregular cystic appearance throughout pancreatic tail. Pt takes daily amylase-lipase-protease supplement. Lipase of 704 on admission with abdominal and back pain. Liver function,  metabolic panel have been normal(except for albumin 3.1). Given patient's low ejection fraction must to be careful about IV fluids. Currently still has nausea/vomiting. We will progress diet as patient tolerates.  - NPO with sips for meds - continue NS at 40ml/hr with KCl - Dilaudid 1mg  q 3 h for pain as needed - Zofran 4 mg q 4 h for nausea - He can take the tramadol 3 times a day for his headaches.   2. CHF: Acute on chronic combined systolic and diastolic. Dilated nonischemic cardiomyopathy with last LVEF of 20-25% in Feb. Pt chest x-ray and physical exam do not suggest volume overload at this point.   - can restart metoprolol, lisinopril, and digoxin. - Chest x-ray  - Continue to monitor for JVD, dyspnea, edema with IV fluids  - Daily weight w/ I&Os  - 2L [place]  3. Atrial Fibrillation- Chad2 score of 3 Rate controlled with digoxin  and metoprolol. Aspirin only anticoagulant. ECG shows irregular, irregular rhythm with rate of 59.  - continue home medications - Cardiac monitoring  4. DM type 2: CBG on admission of 119. Previous admitted this year with HSS, but glucose is much better controlled today. Pt on Aspart sliding scale at home.  - SSI  5. Anemia: Hbg was 12.4 on admission which is around his baseline. Most likely secondary to anemia of chronic disease. Iron deficiency less likely given MCV of 83.  - Continue to monitor   6. Thrombocytopenia: 119 on->90 ->106. Patient baseline is ~140 - Continue to monitor   Diet: NPO with sips for meds  DVT/PE PPx: Lovenox 40 BID   Dispo: Disposition is deferred at this time, awaiting improvement of current medical problems. Anticipated discharge in approximately 1-2 day(s).  The patient does have a current PCP (at [facility]) and does need an [facility] hospital follow-up appointment after discharge.  The patient does not know have transportation limitations that hinder transportation to clinic appointments.    .Services Needed at time of discharge: Y =  Yes, Blank = No PT:   OT:   RN:   Equipment:   Other:     LOS: 1 day   Azucena Cecil, Med Student 10/04/2013, 11:46 AM

## 2013-10-04 NOTE — Progress Notes (Signed)
Patient complaining of 10/10 head pain that was resolved with one time order Toradol over night. MD contacted and told they will come see patient. Order okay to give prn dilaudid early.

## 2013-10-04 NOTE — Progress Notes (Signed)
I have seen the patient and reviewed the daily progress note by Katherene Ponto MS 4 and discussed the care of the patient with them.  See below for documentation of my findings, assessment, and plans.  Subjective: No overnight events. Reports that his headache this morning was completely gone with IV Dilaudid this morning and Toradol last night. He denies chest pain. Remains short of breath with O2 supplementation at 2L [place], with pulse oxygen of 96%. His nausea is improving but he did have one episode of non bilious non bloody emesis last night.   Objective: Vital signs in last 24 hours: Filed Vitals:   10/03/13 2100 10/04/13 0531 10/04/13 0621 10/04/13 1420  BP: 144/78 165/80 143/92 147/76  Pulse: 76 86  76  Temp: 98 F (36.7 C) 98.7 F (37.1 C)  98.5 F (36.9 C)  TempSrc: Oral Oral  Oral  Resp: 20 20  20   Height:      Weight:      SpO2: 94% 95% 96% 96%   Weight change:   Intake/Output Summary (Last 24 hours) at 10/04/13 1438 Last data filed at 10/04/13 1000  Gross per 24 hour  Intake    560 ml  Output    580 ml  Net    -20 ml   General: Resting in bed, in no acute distress  Heart: normal rate, irregularly irregular rhythm, no murmurs appreciated. Soft heart sounds secondary to obese state.  Lungs: Minimal crackles heard at the right lung base, no increased work of breathing.  Abdominal: Well healed cross sectional scar at the mid abdomen, normoactive bowel sounds, mild diffuse tenderness, no guarding or rebound.  Extremities: Trace pitting pretibial edema.   Neuro: Alert and oriented x3, moves 4 extremities voluntairly  Lab Results: Reviewed and documented in Electronic Record Micro Results: Reviewed and documented in Electronic Record Studies/Results: Reviewed and documented in Electronic Record Medications: I have reviewed the patient's current medications. Scheduled Meds: . antiseptic oral rinse  15 mL Mouth Rinse BID  . aspirin  81 mg Oral Daily  . enoxaparin  (LOVENOX) injection  75 mg Subcutaneous Q24H  . folic acid  1 mg Oral Daily  . insulin aspart  0-15 Units Subcutaneous TID WC  . lipase/protease/amylase  2 capsule Oral TID WC  . lisinopril  5 mg Oral Daily  . metoprolol succinate  25 mg Oral q morning - 10a  . pantoprazole  40 mg Oral BID  . sodium chloride  3 mL Intravenous Q12H  . thiamine  100 mg Oral Daily  . vitamin B-12  50 mcg Oral Daily   Continuous Infusions: . 0.9 % NaCl with KCl 20 mEq / L     PRN Meds:.HYDROmorphone (DILAUDID) injection, ketorolac, ondansetron (ZOFRAN) IV, ondansetron  Assessment/Plan: 58 yo man with PMH of CHF with EF of 20-25%, chronic pancreatitis, diabetes who presents to the hospital with abdominal/back pain and elevated lipase consistent with an acute exacerbation of his pancreatitis.   1. Acute on Chronic Pancreatitis- Long history of pancreatitis with history of pseudocyst drainage, partial pancreatomy, pancreatic stent placement (removed in [place], [place]), and hernia repairs in [place]. He reports that his pancreatitis started with gallstone in 2007. Most recent CT scan in Feb showed irregular dilation of the pancreatic duct leading to an irregular cystic appearance throughout pancreatic tail. Pt takes daily amylase-lipase-protease supplement. Lipase of 704 on admission with abdominal and back pain. Liver function, metabolic panel have been normal(except for albumin 3.1). Given patient's low ejection  fraction, will proceed with gentle IV hydration. He reports persistent nausea/vomiting this morning and his diet will be advanced as tolerated. - NPO with sips for meds  - resume home Pancrease PO 2 caps TID ac - continue NS at 73ml/hr with KCl  - Dilaudid 1mg  q 3 h for pain as needed  - Zofran 4 mg q 4 h for nausea  - Toradol 15mg  IV TID PRN for headaches.   2. CHF: Acute on chronic combined systolic and diastolic. 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 Euvolemic  per CXR and per physical exam.   - Restart metoprolol, lisinopril, and digoxin  - Daily weight -Strict ins and outs  - O2 supplementation PRN  3. Atrial Fibrillation-Currently in A.fib but rate controlled. He has CHADs2 score of 3. At home he is rate controlled with digoxin 0.125mg  and metoprolol XL 25mg  daily. He is on aspirin only for anticoagulation.  - Resume home medications  - Continue telemetry - Will attempt to obtain records in regards to anticoagulation risks and benefits  4. DM type 2: Well controlled. CBG <200 consistently. Hg A1C of 6.7% . Previous admitted this year with HSS. He is on Novolog SSI at home.  - SSI   5. Anemia: Hbg of 11.2 today which is close to his baseline of 12.  Most likely secondary to anemia of chronic disease. He has history of iron deficiency anemia but with anemia panel in 2013 with iron of 40, ferritin of 70  - Continue to monitor  - Consider ordering anemia panel  6. Thrombocytopenia: 119 on->90 ->106. Patient baseline is ~140  - Continue to monitor, especially while he is on Lovenox 40mg  SQ BID   DVT/PE PPx: Lovenox 40 BID   FEN:  NS with KCL 23mEq/L at 55ml/hr NPO with sips for meds.   Dispo: Disposition is deferred at this time, awaiting improvement of current medical problems.  Anticipated discharge in approximately 1-2 day(s).   The patient does have a current PCP at the [facility] and will not need an [facility] hospital follow-up appointment after discharge.  The patient does not know have transportation limitations that hinder transportation to clinic appointments.  .Services Needed at time of discharge: Y = Yes, Blank = No PT:   OT:   RN:   Equipment:   Other:     LOS: 1 day   Blain Pais, MD PGY-3, IMTS 10/04/2013, 2:38 PM

## 2013-10-04 NOTE — Progress Notes (Signed)
Utilization review completed.  

## 2013-10-05 LAB — CBC
HCT: 36.4 % — ABNORMAL LOW (ref 39.0–52.0)
HEMOGLOBIN: 11.1 g/dL — AB (ref 13.0–17.0)
MCH: 26.7 pg (ref 26.0–34.0)
MCHC: 30.5 g/dL (ref 30.0–36.0)
MCV: 87.7 fL (ref 78.0–100.0)
Platelets: 96 10*3/uL — ABNORMAL LOW (ref 150–400)
RBC: 4.15 MIL/uL — ABNORMAL LOW (ref 4.22–5.81)
RDW: 15.8 % — ABNORMAL HIGH (ref 11.5–15.5)
WBC: 4.4 10*3/uL (ref 4.0–10.5)

## 2013-10-05 LAB — COMPREHENSIVE METABOLIC PANEL
ALK PHOS: 48 U/L (ref 39–117)
ALT: 7 U/L (ref 0–53)
ANION GAP: 15 (ref 5–15)
AST: 11 U/L (ref 0–37)
Albumin: 2.7 g/dL — ABNORMAL LOW (ref 3.5–5.2)
BILIRUBIN TOTAL: 0.7 mg/dL (ref 0.3–1.2)
BUN: 13 mg/dL (ref 6–23)
CHLORIDE: 107 meq/L (ref 96–112)
CO2: 22 mEq/L (ref 19–32)
Calcium: 8.5 mg/dL (ref 8.4–10.5)
Creatinine, Ser: 0.69 mg/dL (ref 0.50–1.35)
GLUCOSE: 69 mg/dL — AB (ref 70–99)
Potassium: 4 mEq/L (ref 3.7–5.3)
Sodium: 144 mEq/L (ref 137–147)
Total Protein: 7.5 g/dL (ref 6.0–8.3)

## 2013-10-05 LAB — GLUCOSE, CAPILLARY
GLUCOSE-CAPILLARY: 72 mg/dL (ref 70–99)
Glucose-Capillary: 120 mg/dL — ABNORMAL HIGH (ref 70–99)
Glucose-Capillary: 148 mg/dL — ABNORMAL HIGH (ref 70–99)
Glucose-Capillary: 137 mg/dL — ABNORMAL HIGH (ref 70–99)

## 2013-10-05 LAB — LIPASE, BLOOD: Lipase: 37 U/L (ref 11–59)

## 2013-10-05 MED ORDER — DIGOXIN 125 MCG PO TABS
125.0000 ug | ORAL_TABLET | ORAL | Status: DC
  Administered 2013-10-06: 125 ug via ORAL
  Filled 2013-10-05 (×2): qty 1

## 2013-10-05 MED ORDER — PANCRELIPASE (LIP-PROT-AMYL) 12000-38000 UNITS PO CPEP
2.0000 | ORAL_CAPSULE | Freq: Once | ORAL | Status: AC
  Administered 2013-10-05: 2 via ORAL
  Filled 2013-10-05: qty 2

## 2013-10-05 NOTE — Progress Notes (Signed)
RN called to bedside for patient's complaints of shortness of breath. Pt's Sp02 sats 92% on room air, placed on 2L [place] and Sp02 sats climbed to 96%. Patient's lung sounds distant, but clear. Pt labored breathing and coughed up red tinged mucous in basin. Pt's IV fluid running at 53ml/hr. MD made aware and to come to bedside. Humidification attached to oxygen for comfort.

## 2013-10-05 NOTE — Progress Notes (Signed)
I have seen the patient and reviewed the daily progress note by Katherene Ponto MS 4 and discussed the care of the patient with them.  See below for documentation of my findings, assessment, and plans.  Subjective: [name]. Pt continues to be rude to staff and providers not participating in exams, history, and refusing labs. Feeling his abdominal pain getting to baseline.   Objective: Vital signs in last 24 hours: Filed Vitals:   10/04/13 1420 10/04/13 2213 10/05/13 0445 10/05/13 1328  BP: 147/76 158/93 145/79 173/81  Pulse: 76 67 67 65  Temp: 98.5 F (36.9 C) 98.7 F (37.1 C) 98.3 F (36.8 C) 98.6 F (37 C)  TempSrc: Oral Oral Oral Oral  Resp: 20 20 20 20   Height:      Weight:   325 lb 4.8 oz (147.555 kg)   SpO2: 96% 96% 90% 96%   Weight change: 10 lb 4.8 oz (4.672 kg)  Intake/Output Summary (Last 24 hours) at 10/05/13 1439 Last data filed at 10/05/13 0900  Gross per 24 hour  Intake   1190 ml  Output    100 ml  Net   1090 ml   General: resting in bed, NAD HEENT: no scleral icterus Cardiac: RRR, no rubs, murmurs or gallops Pulm: clear to auscultation bilaterally, moving normal volumes of air Abd: soft, mild diffuse tenderness throughout, nondistended, BS present Ext: warm and well perfused, no pedal edema Neuro: alert and oriented X3, cranial nerves II-XII grossly intact  Lab Results: Reviewed and documented in Electronic Record Micro Results: Reviewed and documented in Electronic Record Studies/Results: Reviewed and documented in Electronic Record Medications: I have reviewed the patient's current medications. Scheduled Meds: . antiseptic oral rinse  15 mL Mouth Rinse BID  . aspirin  81 mg Oral Daily  . enoxaparin (LOVENOX) injection  75 mg Subcutaneous Q24H  . folic acid  1 mg Oral Daily  . insulin aspart  0-15 Units Subcutaneous TID WC  . lipase/protease/amylase  2 capsule Oral TID WC  . lisinopril  5 mg Oral Daily  . metoprolol succinate  25 mg Oral q  morning - 10a  . pantoprazole  40 mg Oral BID  . sodium chloride  3 mL Intravenous Q12H  . thiamine  100 mg Oral Daily  . vitamin B-12  50 mcg Oral Daily   Continuous Infusions: . 0.9 % NaCl with KCl 20 mEq / L 75 mL/hr at 10/05/13 1237   PRN Meds:.acetaminophen, HYDROmorphone (DILAUDID) injection, ketorolac, ondansetron (ZOFRAN) IV, ondansetron Assessment/Plan: 1. Acute on Chronic Pancreatitis- Long history of pancreatitis with history of pseudocyst drainage, partial pancreatomy, pancreatic stent placement (removed in [place], [place]), and hernia repairs in [place]. He reports that his pancreatitis started with gallstone in 2007. Most recent CT scan in Feb showed irregular dilation of the pancreatic duct leading to an irregular cystic appearance throughout pancreatic tail. Pt takes daily amylase-lipase-protease supplement. Lipase of 704 on admission with abdominal and back pain. Liver function, metabolic panel have been normal(except for albumin 3.1). Given patient's low ejection fraction, will proceed with gentle IV hydration. He reports persistent nausea/vomiting this morning and his diet will be advanced as tolerated.  - advance diet to clears - resume home Pancrease PO 2 caps TID ac  - continue NS at 40ml/hr with KCl and then consider d/c once able to tolerate clears - Dilaudid 1mg  q 3 h for pain as needed  - Zofran 4 mg q 4 h for nausea  - Toradol 15mg  IV TID  PRN for headaches.   2. CHF: Acute on chronic combined systolic and diastolic. 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 Euvolemic per CXR and per physical exam.  - Restart metoprolol, lisinopril, and digoxin  - Daily weight  -Strict ins and outs  - O2 supplementation PRN   3. Atrial Fibrillation-Currently in A.fib but rate controlled. He has CHADs2 score of 3. At home he is rate controlled with digoxin 0.125mg  and metoprolol XL 25mg  daily. He is on aspirin only for anticoagulation.  - Resume  home medications  - Continue telemetry  - Will attempt to obtain records in regards to anticoagulation risks and benefits   4. DM type 2: Well controlled. CBG <200 consistently. Hg A1C of 6.7% . Previous admitted this year with HSS. He is on Novolog SSI at home.  - SSI   DVT/PE PPx: Lovenox 40 BID which patient intermittently refuses    Dispo: Disposition is deferred at this time, awaiting improvement of current medical problems.  Anticipated discharge in approximately 1-2 day(s).    LOS: 2 days   Clinton Gallant, MD 10/05/2013, 2:39 PM

## 2013-10-05 NOTE — Progress Notes (Signed)
Patient ID: Don Brown, male   DOB: [birth year], 58 y.o.   MRN: [id number] Attending physician note: I personally interviewed and examined this patient today. Status and management plan accurate as recorded by resident physician Dr. Clinton Gallant. Abdominal pain is subsiding. Minimal to no tenderness on my exam this morning. He is starting to take clear liquids. We will advance his diet as tolerated. He is complaining of a persistent headache. No focal neurologic deficits. Borderline elevation of some blood pressures. We will continue to monitor. Adjust medications as indicated.

## 2013-10-05 NOTE — Progress Notes (Signed)
Subjective: Don Brown. Pt continues to have headaches that are minimally responsive to the Toradol and dilaudid. He reports improvement in his abdominal pain although still more than baseline. He denies any episodes of emesis since yesterday morning. He has regained his appetite and would like to have his diet advanced to clears.   He refused lovenox yesterday.  Objective: Vital signs in last 24 hours: Filed Vitals:   10/04/13 1420 10/04/13 2213 10/05/13 0445 10/05/13 1328  BP: 147/76 158/93 145/79 173/81  Pulse: 76 67 67 65  Temp: 98.5 F (36.9 C) 98.7 F (37.1 C) 98.3 F (36.8 C) 98.6 F (37 C)  TempSrc: Oral Oral Oral Oral  Resp: 20 20 20 20   Height:      Weight:   147.555 kg (325 lb 4.8 oz)   SpO2: 96% 96% 90% 96%   Weight change: 4.672 kg (10 lb 4.8 oz)  Intake/Output Summary (Last 24 hours) at 10/05/13 1409 Last data filed at 10/05/13 0900  Gross per 24 hour  Intake   1190 ml  Output    100 ml  Net   1090 ml   Physical Exam: General: Well appearing, lying in bed, in no acute distress Heart: RRR, No JVD, no murmurs appreciated. Soft heart sounds secondary to obese state. Lungs: 2L oxygen, normal work of breathing, CTAB  Abdominal: significant scars from previous surgery. normoactive bowel sounds, mild diffuse tenderness, no guarding or rebound. Extremities: Trace peripheral edema. Skin: no new rashes or lesions  Lab Results: Basic Metabolic Panel:  Recent Labs  10/04/13 0645 10/05/13 0935  NA 146 144  K 3.6* 4.0  CL 108 107  CO2 25 22  GLUCOSE 94 69*  BUN 11 13  CREATININE 0.76 0.69  CALCIUM 8.4 8.5   Liver Function Tests:  Recent Labs  10/04/13 0645 10/05/13 0935  AST 11 11  ALT 8 7  ALKPHOS 54 48  BILITOT 0.5 0.7  PROT 8.4* 7.5  ALBUMIN 3.1* 2.7*    Recent Labs  10/03/13 0835 10/05/13 0935  LIPASE 704* 37   No results found for this basename: AMMONIA,  in the last 72 hours CBC:  Recent Labs  10/03/13 0835  10/04/13 0645  10/05/13 0935  WBC 7.3  < > 5.9 4.4  NEUTROABS 5.7  --   --   --   HGB 12.4*  < > 11.2* 11.1*  HCT 38.4*  < > 37.6* 36.4*  MCV 84.0  < > 87.9 87.7  PLT 116*  < > 106* 96*  < > = values in this interval not displayed. Cardiac Enzymes: No results found for this basename: CKTOTAL, CKMB, CKMBINDEX, TROPONINI,  in the last 72 hours BNP: No results found for this basename: PROBNP,  in the last 72 hours D-Dimer: No results found for this basename: DDIMER,  in the last 72 hours CBG:  Recent Labs  10/04/13 0747 10/04/13 1157 10/04/13 1651 10/04/13 2209 10/05/13 0759 10/05/13 1129  GLUCAP 101* 95 87 86 72 120*   Hemoglobin A1C:  Recent Labs  10/03/13 1511  HGBA1C 6.7*   Fasting Lipid Panel: No results found for this basename: CHOL, HDL, LDLCALC, TRIG, CHOLHDL, LDLDIRECT,  in the last 72 hours Thyroid Function Tests: No results found for this basename: TSH, T4TOTAL, FREET4, T3FREE, THYROIDAB,  in the last 72 hours Anemia Panel: No results found for this basename: VITAMINB12, FOLATE, FERRITIN, TIBC, IRON, RETICCTPCT,  in the last 72 hours Coagulation: No results found for this basename: LABPROT, INR,  in the last 72 hours Urine Drug Screen: Drugs of Abuse     Component Value Date/Time   LABOPIA POSITIVE* 10/03/2013 2212   COCAINSCRNUR NONE DETECTED 10/03/2013 2212   LABBENZ NONE DETECTED 10/03/2013 2212   AMPHETMU NONE DETECTED 10/03/2013 2212   THCU NONE DETECTED 10/03/2013 2212   LABBARB NONE DETECTED 10/03/2013 2212    Alcohol Level:  Recent Labs  10/03/13 1511  ETH <11   Urinalysis: No results found for this basename: COLORURINE, APPERANCEUR, LABSPEC, PHURINE, GLUCOSEU, HGBUR, BILIRUBINUR, KETONESUR, PROTEINUR, UROBILINOGEN, NITRITE, LEUKOCYTESUR,  in the last 72 hours   Micro Results: No results found for this or any previous visit (from the past 240 hour(s)). Studies/Results: Dg Chest 2 View  10/03/2013   CLINICAL DATA:  Dyspnea with mid chest pain.  EXAM:  CHEST  2 VIEW  COMPARISON:  07/07/2013 and 05/05/2013 as well as 05/02/2013  FINDINGS: Lungs are somewhat hypoinflated with stable elevation of the right hemidiaphragm. Subtle linear density over the lateral left midlung and right infrahilar region likely atelectasis. Moderate stable cardiomegaly. Remainder of the exam is unchanged.  IMPRESSION: Stable moderate cardiomegaly.  Minimal bilateral linear atelectasis.   Electronically Signed   By: Marin Olp M.D.   On: 10/03/2013 16:56   Medications: I have reviewed the patient's current medications. Scheduled Meds: . antiseptic oral rinse  15 mL Mouth Rinse BID  . aspirin  81 mg Oral Daily  . enoxaparin (LOVENOX) injection  75 mg Subcutaneous Q24H  . folic acid  1 mg Oral Daily  . insulin aspart  0-15 Units Subcutaneous TID WC  . lipase/protease/amylase  2 capsule Oral TID WC  . lisinopril  5 mg Oral Daily  . metoprolol succinate  25 mg Oral q morning - 10a  . pantoprazole  40 mg Oral BID  . sodium chloride  3 mL Intravenous Q12H  . thiamine  100 mg Oral Daily  . vitamin B-12  50 mcg Oral Daily   Continuous Infusions: . 0.9 % NaCl with KCl 20 mEq / L 75 mL/hr at 10/05/13 1237   PRN Meds:.acetaminophen, HYDROmorphone (DILAUDID) injection, ketorolac, ondansetron (ZOFRAN) IV, ondansetron Assessment/Plan: Mr. Don Brown is a 58 yo man with PMH of CHF with EF of 20-25%, chronic pancreatitis, diabetes who presents to the hospital with abdominal/back pain and elevated lipase consistent with an acute exacerbation of his pancreatitis.  Principal Problem:   Pancreatitis, acute Active Problems:   Atrial fibrillation   Chronic pancreatitis   CHF (congestive heart failure)   Morbid obesity   DM (dermatomyositis)   Acute pancreatitis  1. Acute on Chronic Pancreatitis- Long history of pancreatitis including previous surgery and stent placement. Most recent CT scan in Feb showed irregular dilation of the pancreatic duct leading to an irregular cystic  appearance throughout pancreatic tail. Pt takes daily amylase-lipase-protease supplement. Lipase of 704 on admission with abdominal and back pain. Liver function, metabolic panel have been normal(except for albumin 3.1). Given patient's low ejection fraction must to be careful about IV fluids. Nausea improving but still diffusely tender although is tender at baseline from chronic pancreatits. Lipase was 37 today. We will progress diet as patient tolerates.  - Clear liquid diet - continue NS at 18ml/hr with KCl - Dilaudid 1mg  q 3 h for pain as needed - Zofran 4 mg q 4 h for nausea - He can take the tramadol 3 times a day for his headaches.   2. CHF: Acute on chronic combined systolic and diastolic. Dilated nonischemic cardiomyopathy  with last LVEF of 20-25% in Feb. Pt chest x-ray and physical exam do not suggest volume overload at this point.   - continue metoprolol, lisinopril, and digoxin. - Chest x-ray  - Continue to monitor for JVD, dyspnea, edema with IV fluids  - Daily weight w/ I&Os  - 2L [place]  3. Atrial Fibrillation- Chad2 score of 3. Rate controlled with digoxin and metoprolol. Aspirin only anticoagulant. ECG shows irregular, irregular rhythm with rate of 59.  - continue home medications - cardiac monitoring  4. DM type 2: CBG on admission of 119. Previous admitted this year with HSS, but glucose is much better controlled today. Pt on Aspart sliding scale at home.  - SSI  5. Anemia: Hbg was 12.4 on admission which is around his baseline. Most likely secondary to anemia of chronic disease. Iron deficiency less likely given MCV of 83.  - Continue to monitor   6. Thrombocytopenia: 119 on->90 ->106->96. Patient baseline is ~140 - Continue to monitor   Diet: NPO with sips for meds  DVT/PE PPx: Lovenox 40 BID, although patient continues to refuse   Dispo: Disposition is deferred at this time, awaiting improvement of current medical problems. Anticipated discharge in approximately 1-2  day(s).  The patient does have a current PCP (at [facility]) and does need an [facility] hospital follow-up appointment after discharge.  The patient does not know have transportation limitations that hinder transportation to clinic appointments.    .Services Needed at time of discharge: Y = Yes, Blank = No PT:   OT:   RN:   Equipment:   Other:     LOS: 2 days   Azucena Cecil, Med Student 10/05/2013, 2:09 PM

## 2013-10-05 NOTE — Care Management Note (Signed)
    Page 1 of 1   10/06/2013     5:16:58 PM CARE MANAGEMENT NOTE 10/06/2013  Patient:  [facility]   Account Number:  [id number]  Date Initiated:  10/05/2013  Documentation initiated by:  Tomi Bamberger  Subjective/Objective Assessment:   dx pancreatitis  admit- lives with family.     Action/Plan:   Anticipated DC Date:  10/06/2013   Anticipated DC Plan:  [place]  CM consult      Choice offered to / List presented to:             Status of service:  Completed, signed off Medicare Important Message given?   (If response is "NO", the following Medicare IM given date fields will be blank) Date Medicare IM given:   Medicare IM given by:   Date Additional Medicare IM given:   Additional Medicare IM given by:    Discharge Disposition:  HOME/SELF CARE  Per UR Regulation:  Reviewed for med. necessity/level of care/duration of stay  If discussed at [place] of Stay Meetings, dates discussed:    Comments:  7/171/5 [place], BSN [phone] patient for dc today, states he goes to [place] to get his medications

## 2013-10-06 LAB — CBC
HEMATOCRIT: 37.2 % — AB (ref 39.0–52.0)
HEMOGLOBIN: 11.4 g/dL — AB (ref 13.0–17.0)
MCH: 27 pg (ref 26.0–34.0)
MCHC: 30.6 g/dL (ref 30.0–36.0)
MCV: 88.2 fL (ref 78.0–100.0)
Platelets: 119 10*3/uL — ABNORMAL LOW (ref 150–400)
RBC: 4.22 MIL/uL (ref 4.22–5.81)
RDW: 15.8 % — ABNORMAL HIGH (ref 11.5–15.5)
WBC: 5 10*3/uL (ref 4.0–10.5)

## 2013-10-06 LAB — BASIC METABOLIC PANEL
ANION GAP: 12 (ref 5–15)
BUN: 10 mg/dL (ref 6–23)
CHLORIDE: 106 meq/L (ref 96–112)
CO2: 25 meq/L (ref 19–32)
Calcium: 8.5 mg/dL (ref 8.4–10.5)
Creatinine, Ser: 0.74 mg/dL (ref 0.50–1.35)
GFR calc Af Amer: >90 mL/min (ref >90)
GFR calc non Af Amer: >90 mL/min (ref >90)
Glucose, Bld: 136 mg/dL — ABNORMAL HIGH (ref 70–99)
Potassium: 3.7 mEq/L (ref 3.7–5.3)
SODIUM: 143 meq/L (ref 137–147)

## 2013-10-06 LAB — GLUCOSE, CAPILLARY: GLUCOSE-CAPILLARY: 106 mg/dL — AB (ref 70–99)

## 2013-10-06 MED ORDER — OXYCODONE HCL 10 MG PO TABS: 10.0000 mg | ORAL_TABLET | Freq: Four times a day (QID) | ORAL | Status: DC | PRN

## 2013-10-06 MED ORDER — GUAIFENESIN ER 600 MG PO TB12
600.0000 mg | ORAL_TABLET | Freq: Two times a day (BID) | ORAL | Status: DC
  Administered 2013-10-06: 600 mg via ORAL
  Filled 2013-10-06 (×2): qty 1

## 2013-10-06 MED ORDER — FUROSEMIDE 40 MG PO TABS
40.0000 mg | ORAL_TABLET | Freq: Two times a day (BID) | ORAL | Status: DC
  Filled 2013-10-06 (×2): qty 1

## 2013-10-06 MED ORDER — OXYCODONE HCL 5 MG PO TABS
5.0000 mg | ORAL_TABLET | ORAL | Status: DC | PRN
  Administered 2013-10-06: 5 mg via ORAL
  Filled 2013-10-06: qty 1

## 2013-10-06 MED ORDER — GUAIFENESIN ER 600 MG PO TB12: 600.0000 mg | ORAL_TABLET | Freq: Two times a day (BID) | ORAL | Status: DC

## 2013-10-06 MED ORDER — IPRATROPIUM-ALBUTEROL 0.5-2.5 (3) MG/3ML IN SOLN: 3.0000 mL | RESPIRATORY_TRACT | Status: DC

## 2013-10-06 MED ORDER — IPRATROPIUM-ALBUTEROL 0.5-2.5 (3) MG/3ML IN SOLN: 3.0000 mL | RESPIRATORY_TRACT | Status: DC | PRN

## 2013-10-06 MED ORDER — FUROSEMIDE 10 MG/ML IJ SOLN
40.0000 mg | Freq: Once | INTRAMUSCULAR | Status: AC
  Administered 2013-10-06: 40 mg via INTRAVENOUS
  Filled 2013-10-06: qty 4

## 2013-10-06 MED ORDER — ACETAMINOPHEN 325 MG PO TABS: 650.0000 mg | ORAL_TABLET | Freq: Four times a day (QID) | ORAL | Status: DC | PRN

## 2013-10-06 NOTE — Progress Notes (Signed)
I have seen the patient and reviewed the daily progress note by Katherene Ponto MS 4 and discussed the care of the patient with them.  See below for documentation of my findings, assessment, and plans.  Subjective: [name]. Pt tolerated regular diet w/o uncontrolled pain. Pt says he feels back to his baseline. Continues to refuse many tests and medications including lovenox.   Objective: Vital signs in last 24 hours: Filed Vitals:   10/05/13 1525 10/05/13 2100 10/06/13 0619 10/06/13 1002  BP:  163/75 158/79 137/87  Pulse:  69 69 98  Temp:  98.1 F (36.7 C) 98.5 F (36.9 C)   TempSrc:  Oral Oral   Resp:  18 18   Height:      Weight:   329 lb 6.4 oz (149.415 kg)   SpO2: 96% 92% 96%    Weight change: 4 lb 1.6 oz (1.86 kg)  Intake/Output Summary (Last 24 hours) at 10/06/13 1137 Last data filed at 10/06/13 1136  Gross per 24 hour  Intake    250 ml  Output   2350 ml  Net  -2100 ml   General: sitting up in bed, NAD Cardiac: RRR, no rubs, murmurs or gallops Pulm: clear to auscultation bilaterally, no crackles wheezes or rhonchi, moving normal volumes of air Abd: soft, nontender, nondistended, BS present Ext: warm and well perfused, no pedal edema Neuro: alert and oriented X3, cranial nerves II-XII grossly intact  Lab Results: Reviewed and documented in Electronic Record Micro Results: Reviewed and documented in Electronic Record Studies/Results: Reviewed and documented in Electronic Record Medications: I have reviewed the patient's current medications. Scheduled Meds: . antiseptic oral rinse  15 mL Mouth Rinse BID  . aspirin  81 mg Oral Daily  . digoxin  125 mcg Oral BH-q7a  . enoxaparin (LOVENOX) injection  75 mg Subcutaneous Q24H  . folic acid  1 mg Oral Daily  . furosemide  40 mg Oral BID  . guaiFENesin  600 mg Oral BID  . insulin aspart  0-15 Units Subcutaneous TID WC  . lipase/protease/amylase  2 capsule Oral TID WC  . lisinopril  5 mg Oral Daily  . metoprolol  succinate  25 mg Oral q morning - 10a  . pantoprazole  40 mg Oral BID  . sodium chloride  3 mL Intravenous Q12H  . thiamine  100 mg Oral Daily  . vitamin B-12  50 mcg Oral Daily   Continuous Infusions:  PRN Meds:.acetaminophen, ipratropium-albuterol, ondansetron (ZOFRAN) IV, ondansetron, oxyCODONE Assessment/Plan: 1. Acute on Chronic Pancreatitis resolved- Long history of pancreatitis with history of pseudocyst drainage, partial pancreatomy, pancreatic stent placement (removed in [place], [place]), and hernia repairs in [place]. He reports that his pancreatitis started with gallstone in 2007. Most recent CT scan in Feb showed irregular dilation of the pancreatic duct leading to an irregular cystic appearance throughout pancreatic tail. Pt takes daily amylase-lipase-protease supplement. Lipase of 704>>41  - cont to ADAT pt is tolerating regular diet at this time - cont Pancrease PO 2 caps TID ac  - d/c all IVF - Dilaudid 1mg  q 3 h for pain as needed >>transition to po Oxy IR at home dose of 10mg  q4h - Zofran 4 mg q 4 h for nausea   2. CHF: Acute on chronic combined systolic and diastolic. 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 Euvolemic per CXR and per physical exam.  - cont metoprolol, lisinopril, and digoxin  - Daily weight  -Strict ins and outs  -  O2 supplementation PRN  -pt slightly fluid overloaded today will give IV lasix 40mg  x1 and then continue home dose (as pt missed in setting of NPO status)  3. Atrial Fibrillation-Currently in A.fib but rate controlled. He has CHADs2 score of 3. At home he is rate controlled with digoxin 0.125mg  and metoprolol XL 25mg  daily. He is on aspirin only for anticoagulation.  - Resume home medications  - Continue telemetry   4. DM type 2: Well controlled. CBG <200 consistently. Hg A1C of 6.7% . Previous admitted this year with HSS. He is on Novolog SSI at home.  - SSI    Dispo: Disposition is deferred at this  time, awaiting improvement of current medical problems.  Anticipated discharge in approximately 1 day(s).   The patient does have a current PCP (Provider Not In System) and does not need an [facility] hospital follow-up appointment after discharge.  The patient does not have transportation limitations that hinder transportation to clinic appointments.  .Services Needed at time of discharge: Y = Yes, Blank = No PT:   OT:   RN:   Equipment:   Other:     LOS: 3 days   Clinton Gallant, MD 10/06/2013, 11:37 AM

## 2013-10-06 NOTE — Discharge Summary (Signed)
Name: Don Brown MRN: [id number] DOB: [birth year] 58 y.o. PCP: Provider Not In System  Date of Admission: 10/03/2013  7:28 AM Date of Discharge: 10/06/2013 Attending Physician: Annia Belt, MD  Discharge Diagnosis: 1. Acute on chronic Pancreatitis 2. CHF 3. AFib 4. DM 2  Discharge Medications:   Medication List    STOP taking these medications       HYDROmorphone 2 MG tablet  Commonly known as:  DILAUDID      TAKE these medications       acetaminophen 325 MG tablet  Commonly known as:  TYLENOL  Take 2 tablets (650 mg total) by mouth every 6 (six) hours as needed (for pain).     aspirin 81 MG tablet  Take 81 mg by mouth daily before breakfast.     digoxin 0.125 MG tablet  Commonly known as:  LANOXIN  Take 125 mcg by mouth every morning.     folic acid 1 MG tablet  Commonly known as:  FOLVITE  Take 1 tablet (1 mg total) by mouth daily.     furosemide 40 MG tablet  Commonly known as:  LASIX  Take 1 tablet (40 mg total) by mouth 2 (two) times daily. Fluid     glucose monitoring kit monitoring kit  1 each by Does not apply route 4 (four) times daily - after meals and at bedtime. 1 month Diabetic Testing Supplies for QAC-QHS accuchecks.Any brand OK     GREEN TEA PO  Take 2 tablets by mouth daily.     guaiFENesin 600 MG 12 hr tablet  Commonly known as:  MUCINEX  Take 1 tablet (600 mg total) by mouth 2 (two) times daily.     ibuprofen 200 MG tablet  Commonly known as:  ADVIL,MOTRIN  Take 400 mg by mouth every 6 (six) hours as needed for headache or moderate pain.     insulin aspart 100 UNIT/ML injection  Commonly known as:  NOVOLOG  - Before each meal 3 times a day and then bedtime, 140-199 - 2 units, 200-250 - 4 units, 251-299 - 6 units,  300-349 - 8 units,  350 or above 10 units.  - Dispense syringes and needles as needed, Ok to switch to [employer] if approved. Okay to switch to any approved short-acting insulin.     lisinopril 5 MG tablet  Commonly  known as:  PRINIVIL,ZESTRIL  Take 1 tablet (5 mg total) by mouth daily.     metoprolol succinate 25 MG 24 hr tablet  Commonly known as:  TOPROL-XL  Take 1 tablet (25 mg total) by mouth every morning.     Oxycodone HCl 10 MG Tabs  Take 1 tablet (10 mg total) by mouth every 6 (six) hours as needed for severe pain.     PANCREASE PO  Take 2 capsules by mouth 3 (three) times daily with meals.     pantoprazole 40 MG tablet  Commonly known as:  PROTONIX  Take 1 tablet (40 mg total) by mouth 2 (two) times daily.     promethazine 25 MG tablet  Commonly known as:  PHENERGAN  Take 25 mg by mouth every 8 (eight) hours as needed for nausea or vomiting.     thiamine 100 MG tablet  Take 1 tablet (100 mg total) by mouth daily.     VITAMIN B 12 PO  Take 2 tablets by mouth daily.        Disposition and follow-up:   Don Brown was discharged from  [facility] in Stable condition.  At the hospital follow up visit please address:  1.  Pain management, chronic pancreatitis  2.  Labs / imaging needed at time of follow-up: none   3.  Pending labs/ test needing follow-up: none   Follow-up Appointments: Follow-up Information   Follow up with Don Brown at [facility]. Schedule an appointment as soon as possible for a visit in 2 weeks. (for f/u)       Discharge Instructions: Discharge Instructions   (HEART FAILURE PATIENTS) Call MD:  Anytime you have any of the following symptoms: 1) 3 pound weight gain in 24 hours or 5 pounds in 1 week 2) shortness of breath, with or without a dry hacking cough 3) swelling in the hands, feet or stomach 4) if you have to sleep on extra pillows at night in order to breathe.    Complete by:  As directed      Call MD for:  persistant nausea and vomiting    Complete by:  As directed      Call MD for:  severe uncontrolled pain    Complete by:  As directed      Diet - low sodium heart healthy    Complete by:  As directed      Increase activity  slowly    Complete by:  As directed            Consultations:  none   Procedures Performed:  Dg Chest 2 View  10/03/2013   CLINICAL DATA:  Dyspnea with mid chest pain.  EXAM: CHEST  2 VIEW  COMPARISON:  07/07/2013 and 05/05/2013 as well as 05/02/2013  FINDINGS: Lungs are somewhat hypoinflated with stable elevation of the right hemidiaphragm. Subtle linear density over the lateral left midlung and right infrahilar region likely atelectasis. Moderate stable cardiomegaly. Remainder of the exam is unchanged.  IMPRESSION: Stable moderate cardiomegaly.  Minimal bilateral linear atelectasis.   Electronically Signed   By: Marin Olp M.D.   On: 10/03/2013 16:56   Admission HPI: Don Brown is a 58 yo man with PMH of CHF with EF of 20-25%, chronic pancreatitis, diabetes, morbid obesity, chronic pain, narcotic dependency, who presented to the hospital with left upper quadrant pain that started this morning. He describes the pain as sharp, 6/10, radiating to the back. Activity and fast food worsen the pain. He has nausea and vomiting, but denies fever, chills, and diarrhea. Pt was initially short of breath but improved with 2L nasal cannula. EMS reports pt complained of left lower back and flank pain. He had a CBG of 119. Patient refused to answer questions on admission, saying that he "had already answer them" and to "look in the chart".  Of note patient has been admitted to the hospital twice previously this year. The first was for [facility] with acute pancreatitis in Feb when he had a glucose of 900 with lactic acidosis and was intubated for respiratory failure. The second was in April for with worsening dyspnea on exertion and orthopnea. He responded well to IV lasix and was started on low dose lisinopril in addition to his metoprolol.  Per Dr. Rosanne Sack note on on 01/29/13, pt has a history of open surgery for pancreatitis, pseudocyst, and possible partial pancreatectomy at [facility] in [place].  He also has history of pancreatic duct stent placement that was removed a few years ago.  For his diabetes he takes sliding scale insulin aspart. His Afib is rate controlled with  the metoprolol and digoxin. He is not on any anticoagulants besides aspirin.   Hospital Course by problem list: # Acute on Chronic Pancreatitis - Pt has long history of pancreatitis with pseudocyst drainage, partial pancreatomy, pancreatic stent placement, and hernia repairs. Most recent CT scan in Feb showed irregular dilation of the pancreatic duct leading to an irregular cystic appearance throughout pancreatic tail. On admission lipase was 704 and patient had abdominal pain as described above. His liver function, BMP was normal (except for albumin 3.1). He was started on IV fluids and made NPO. Pt's pain was managed with IV diluadid, tramadol, and acetaminophen. His diet was then progressed as tolerated. At the time of discharge he was tolerating solid foods and his abdominal pain had improved to its baseline level. His lipase had decreased to 37. He was discharged on his home oral oxycodone until his follow up.   # CHF not in acute exacerbation: Pt has acute on chronic combined systolic and diastolic heart failure 2/2 to dilated nonischemic cardiomyopathy with last LVEF of 20-25% in Feb 2015. He was euvolemic on admission and fluid status was monitored closely while receiving IV fluids. He became short of breath on hospital day # 3 and appeared somewhat hypovolemic. He was given a one-time dose of 40 mg IV Lasix. At the time of discharge patient's breathing was stable and volume status was at baseline. He was discharge on his home metoprolol, Lisinopril, digoxin and Lasix.   # Atrial Fibrillation-ECG showed afib on admission . He has CHADs2 score of 3. He is rate controlled on digoxin 0.144m and metoprolol XL 226mdaily, which he continued as tolerated while hospitalized. He continued aspirin, but is not on any other  anticoagulants. He was monitored by telemetry while admitted.   # DM type 2: Currently well controlled with CBG <200 consistently and Hg A1C of 6.7% although was previously admitted this year with HSS. He was kept on sliding scale insulin while hospitalized and will resume his Aspart at home.   Discharge Vitals:   BP 137/87  Pulse 98  Temp(Src) 98.5 F (36.9 C) (Oral)  Resp 18  Ht 6' (1.829 m)  Wt 329 lb 6.4 oz (149.415 kg)  BMI 44.66 kg/m2  SpO2 96% General: sitting up in bed, NAD  Cardiac: RRR, no rubs, murmurs or gallops  Pulm: clear to auscultation bilaterally, no crackles wheezes or rhonchi, moving normal volumes of air  Abd: soft, nontender, nondistended, BS present  Ext: warm and well perfused, no pedal edema  Neuro: alert and oriented X3, cranial nerves II-XII grossly intact  Discharge Labs:  Results for orders placed during the hospital encounter of 10/03/13 (from the past 24 hour(s))  GLUCOSE, CAPILLARY     Status: Abnormal   Collection Time    10/05/13  4:53 PM      Result Value Ref Range   Glucose-Capillary 148 (*) 70 - 99 mg/dL  GLUCOSE, CAPILLARY     Status: Abnormal   Collection Time    10/05/13  9:22 PM      Result Value Ref Range   Glucose-Capillary 137 (*) 70 - 99 mg/dL   Comment 1 Notify RN     Comment 2 Documented in Chart    GLUCOSE, CAPILLARY     Status: Abnormal   Collection Time    10/06/13  7:58 AM      Result Value Ref Range   Glucose-Capillary 106 (*) 70 - 99 mg/dL  BASIC METABOLIC PANEL  Status: Abnormal   Collection Time    10/06/13  9:18 AM      Result Value Ref Range   Sodium 143  137 - 147 mEq/L   Potassium 3.7  3.7 - 5.3 mEq/L   Chloride 106  96 - 112 mEq/L   CO2 25  19 - 32 mEq/L   Glucose, Bld 136 (*) 70 - 99 mg/dL   BUN 10  6 - 23 mg/dL   Creatinine, Ser 0.74  0.50 - 1.35 mg/dL   Calcium 8.5  8.4 - 10.5 mg/dL   GFR calc non Af Amer >90  >90 mL/min   GFR calc Af Amer >90  >90 mL/min   Anion gap 12  5 - 15  CBC     Status:  Abnormal   Collection Time    10/06/13  9:18 AM      Result Value Ref Range   WBC 5.0  4.0 - 10.5 K/uL   RBC 4.22  4.22 - 5.81 MIL/uL   Hemoglobin 11.4 (*) 13.0 - 17.0 g/dL   HCT 37.2 (*) 39.0 - 52.0 %   MCV 88.2  78.0 - 100.0 fL   MCH 27.0  26.0 - 34.0 pg   MCHC 30.6  30.0 - 36.0 g/dL   RDW 15.8 (*) 11.5 - 15.5 %   Platelets 119 (*) 150 - 400 K/uL    Signed: Clinton Gallant, MD 10/06/2013, 11:36 AM   Services Ordered on Discharge: none  Equipment Ordered on Discharge: none

## 2013-10-06 NOTE — Progress Notes (Signed)
Pt going home. Has O2 at home. States that he only uses it when he needs it at home. Heart Failure packet given. All questions answered fully.

## 2013-10-06 NOTE — Progress Notes (Signed)
Patient ID: Don Brown, male   DOB: Apr 06, 1955, 58 y.o.   MRN: [id number] Attending physician note: He continues to improve. Eating solid food. Still having intermittent headaches. Significant decrease in abdominal pain. No further nausea or vomiting. Marked decrease in serum lipase compared with admission values. He is stable for discharge today. Discharge plan Will be documented in the discharge summary but I do not anticipate any change in management plan outlined in the progress note today by resident physician Dr. Clinton Gallant.

## 2013-10-06 NOTE — Progress Notes (Signed)
Subjective: [name]. Patient said that his "pancratic pain" is getting better and his abdominal pain is back close to baseline. He is tolerating PO food intake. Pt reported being short of breath yesterday, but breathing is better this morning. He asked about when he will be discharged.  He continues to refuse his Lovenox.  Objective: Vital signs in last 24 hours: Filed Vitals:   10/05/13 1524 10/05/13 1525 10/05/13 2100 10/06/13 0619  BP:   163/75 158/79  Pulse:   69 69  Temp:   98.1 F (36.7 C) 98.5 F (36.9 C)  TempSrc:   Oral Oral  Resp:   18 18  Height:      Weight:    149.415 kg (329 lb 6.4 oz)  SpO2: 92% 96% 92% 96%   Weight change: 1.86 kg (4 lb 1.6 oz)  Intake/Output Summary (Last 24 hours) at 10/06/13 0840 Last data filed at 10/05/13 2235  Gross per 24 hour  Intake   1190 ml  Output    350 ml  Net    840 ml   Physical Exam: General: Well appearing,sitting in bed, in no acute distress Heart: RRR, No JVD, no murmurs appreciated. Soft heart sounds secondary to obese state. Lungs: room air, slightly tachypneic, mild expiratory wheezes bilaterally  Abdominal: significant scars from previous surgery. normoactive bowel sounds, moderate diffuse tenderness, no guarding or rebound. Extremities: Trace peripheral edema. Skin: no new rashes or lesions  Lab Results: Basic Metabolic Panel:  Recent Labs  10/04/13 0645 10/05/13 0935  NA 146 144  K 3.6* 4.0  CL 108 107  CO2 25 22  GLUCOSE 94 69*  BUN 11 13  CREATININE 0.76 0.69  CALCIUM 8.4 8.5   Liver Function Tests:  Recent Labs  10/04/13 0645 10/05/13 0935  AST 11 11  ALT 8 7  ALKPHOS 54 48  BILITOT 0.5 0.7  PROT 8.4* 7.5  ALBUMIN 3.1* 2.7*    Recent Labs  10/05/13 0935  LIPASE 37   No results found for this basename: AMMONIA,  in the last 72 hours CBC:  Recent Labs  10/04/13 0645 10/05/13 0935  WBC 5.9 4.4  HGB 11.2* 11.1*  HCT 37.6* 36.4*  MCV 87.9 87.7  PLT 106* 96*   Cardiac  Enzymes: No results found for this basename: CKTOTAL, CKMB, CKMBINDEX, TROPONINI,  in the last 72 hours BNP: No results found for this basename: PROBNP,  in the last 72 hours D-Dimer: No results found for this basename: DDIMER,  in the last 72 hours CBG:  Recent Labs  10/04/13 2209 10/05/13 0759 10/05/13 1129 10/05/13 1653 10/05/13 2122 10/06/13 0758  GLUCAP 86 72 120* 148* 137* 106*   Hemoglobin A1C:  Recent Labs  10/03/13 1511  HGBA1C 6.7*   Fasting Lipid Panel: No results found for this basename: CHOL, HDL, LDLCALC, TRIG, CHOLHDL, LDLDIRECT,  in the last 72 hours Thyroid Function Tests: No results found for this basename: TSH, T4TOTAL, FREET4, T3FREE, THYROIDAB,  in the last 72 hours Anemia Panel: No results found for this basename: VITAMINB12, FOLATE, FERRITIN, TIBC, IRON, RETICCTPCT,  in the last 72 hours Coagulation: No results found for this basename: LABPROT, INR,  in the last 72 hours Urine Drug Screen: Drugs of Abuse     Component Value Date/Time   LABOPIA POSITIVE* 10/03/2013 [place] 10/03/2013 2212   LABBENZ NONE DETECTED 10/03/2013 2212   AMPHETMU NONE DETECTED 10/03/2013 [place] DETECTED 10/03/2013 2212   LABBARB  NONE DETECTED 10/03/2013 2212    Alcohol Level:  Recent Labs  10/03/13 1511  ETH <11   Urinalysis: No results found for this basename: COLORURINE, APPERANCEUR, LABSPEC, PHURINE, GLUCOSEU, HGBUR, BILIRUBINUR, KETONESUR, PROTEINUR, UROBILINOGEN, NITRITE, LEUKOCYTESUR,  in the last 72 hours   Micro Results: No results found for this or any previous visit (from the past 240 hour(s)). Studies/Results: No results found. Medications: I have reviewed the patient's current medications. Scheduled Meds: . antiseptic oral rinse  15 mL Mouth Rinse BID  . aspirin  81 mg Oral Daily  . digoxin  125 mcg Oral BH-q7a  . enoxaparin (LOVENOX) injection  75 mg Subcutaneous Q24H  . folic acid  1 mg Oral Daily  . furosemide   40 mg Intravenous Once  . furosemide  40 mg Oral BID  . guaiFENesin  600 mg Oral BID  . insulin aspart  0-15 Units Subcutaneous TID WC  . ipratropium-albuterol  3 mL Nebulization Q4H  . lipase/protease/amylase  2 capsule Oral TID WC  . lisinopril  5 mg Oral Daily  . metoprolol succinate  25 mg Oral q morning - 10a  . pantoprazole  40 mg Oral BID  . sodium chloride  3 mL Intravenous Q12H  . thiamine  100 mg Oral Daily  . vitamin B-12  50 mcg Oral Daily   Continuous Infusions:   PRN Meds:.acetaminophen, ondansetron (ZOFRAN) IV, ondansetron, oxyCODONE Assessment/Plan: [name] is a 58 yo man with PMH of CHF with EF of 20-25%, chronic pancreatitis, diabetes who presents to the hospital with abdominal/back pain and elevated lipase consistent with an acute exacerbation of his pancreatitis. Principal Problem:   Pancreatitis, acute Active Problems:   Atrial fibrillation   Chronic pancreatitis   CHF (congestive heart failure)   Morbid obesity   DM (dermatomyositis)   Acute pancreatitis  1. Acute on Chronic Pancreatitis- Long history of pancreatitis including previous surgery and stent placement. Most recent CT scan in Feb showed irregular dilation of the pancreatic duct leading to an irregular cystic appearance throughout pancreatic tail. Pt takes daily amylase-lipase-protease supplement. Lipase of 704 on admission with abdominal and back pain. Liver function, metabolic panel have been normal(except for albumin 3.1).  Nausea improving but still diffusely tender  Lipase was 37 yesterda. Pt is tolerating solid foods. - advance diet as tolerated - Change pain management to oral oxycodone 5mg  q 12 - Zofran 4 mg q 4 h for nausea    2. CHF: Acute on chronic combined systolic and diastolic. Dilated nonischemic cardiomyopathy with last LVEF of 20-25% in Feb. Mild increased work of breathing, and 5lb weight gain indicates fluid retention.  - continue metoprolol, lisinopril, and digoxin. -  one time lasix dose 40 IV - restart furosemide 40mg  BID this afternoon  - Daily weight w/ I&Os  - nasal cannula as tolerated  3. Atrial Fibrillation- Chad2 score of 3. Rate controlled with digoxin and metoprolol. Aspirin only anticoagulant. ECG shows irregular, irregular rhythm with rate of 59.  - continue home medications - cardiac monitoring  4. DM type 2: CBG on admission of 119. Previous admitted this year with HSS, but glucose is much better controlled today. Pt on Aspart sliding scale at home.  - SSI  5. Anemia: Hbg was 12.4 on admission which is around his baseline. Trended down to 11.1 during course of stay. Most likely secondary to anemia of chronic disease. Iron deficiency less likely given MCV of 83.  - Continue to monitor   6. Thrombocytopenia: 119  on->90 ->106->96. Patient baseline is ~140  Diet: Advance diet as tolerated  DVT/PE PPx: Lovenox 40 BID, although patient continues to refuse   Dispo: Will likely discharge this afternoon after IV lasix dose. The patient does have a current PCP (at [facility]) and does need an [facility] hospital follow-up appointment after discharge.  The patient does not know have transportation limitations that hinder transportation to clinic appointments.    .Services Needed at time of discharge: Y = Yes, Blank = No PT:   OT:   RN:   Equipment:   Other:     LOS: 3 days   Azucena Cecil, Med Student 10/06/2013, 8:40 AM

## 2013-10-06 NOTE — Progress Notes (Signed)
Don Brown to be D/C'd Home per MD order.  Discussed with the patient and all questions fully answered.    Medication List    STOP taking these medications       HYDROmorphone 2 MG tablet  Commonly known as:  DILAUDID      TAKE these medications       acetaminophen 325 MG tablet  Commonly known as:  TYLENOL  Take 2 tablets (650 mg total) by mouth every 6 (six) hours as needed (for pain).     aspirin 81 MG tablet  Take 81 mg by mouth daily before breakfast.     digoxin 0.125 MG tablet  Commonly known as:  LANOXIN  Take 125 mcg by mouth every morning.     folic acid 1 MG tablet  Commonly known as:  FOLVITE  Take 1 tablet (1 mg total) by mouth daily.     furosemide 40 MG tablet  Commonly known as:  LASIX  Take 1 tablet (40 mg total) by mouth 2 (two) times daily. Fluid     glucose monitoring kit monitoring kit  1 each by Does not apply route 4 (four) times daily - after meals and at bedtime. 1 month Diabetic Testing Supplies for QAC-QHS accuchecks.Any brand OK     GREEN TEA PO  Take 2 tablets by mouth daily.     guaiFENesin 600 MG 12 hr tablet  Commonly known as:  MUCINEX  Take 1 tablet (600 mg total) by mouth 2 (two) times daily.     ibuprofen 200 MG tablet  Commonly known as:  ADVIL,MOTRIN  Take 400 mg by mouth every 6 (six) hours as needed for headache or moderate pain.     insulin aspart 100 UNIT/ML injection  Commonly known as:  NOVOLOG  - Before each meal 3 times a day and then bedtime, 140-199 - 2 units, 200-250 - 4 units, 251-299 - 6 units,  300-349 - 8 units,  350 or above 10 units.  - Dispense syringes and needles as needed, Ok to switch to [employer] if approved. Okay to switch to any approved short-acting insulin.     lisinopril 5 MG tablet  Commonly known as:  PRINIVIL,ZESTRIL  Take 1 tablet (5 mg total) by mouth daily.     metoprolol succinate 25 MG 24 hr tablet  Commonly known as:  TOPROL-XL  Take 1 tablet (25 mg total) by mouth every morning.     Oxycodone HCl 10 MG Tabs  Take 1 tablet (10 mg total) by mouth every 6 (six) hours as needed for severe pain.     PANCREASE PO  Take 2 capsules by mouth 3 (three) times daily with meals.     pantoprazole 40 MG tablet  Commonly known as:  PROTONIX  Take 1 tablet (40 mg total) by mouth 2 (two) times daily.     promethazine 25 MG tablet  Commonly known as:  PHENERGAN  Take 25 mg by mouth every 8 (eight) hours as needed for nausea or vomiting.     thiamine 100 MG tablet  Take 1 tablet (100 mg total) by mouth daily.     VITAMIN B 12 PO  Take 2 tablets by mouth daily.        VVS, Skin clean, dry and intact without evidence of skin break down, no evidence of skin tears noted. IV catheter discontinued intact. Site without signs and symptoms of complications. Dressing and pressure applied.  An After Visit Summary was printed and given  to the patient.  D/c education completed with patient/family including follow up instructions, medication list, d/c activities limitations if indicated, with other d/c instructions as indicated by MD - patient able to verbalize understanding, all questions fully answered.   Patient instructed to return to ED, call 911, or call MD for any changes in condition.   Patient escorted via [place], and D/C home via private auto.  Don Brown 10/06/2013 1:29 PM

## 2013-11-30 ENCOUNTER — Ambulatory Visit: Payer: Medicaid Other | Admitting: Cardiology

## 2014-02-05 ENCOUNTER — Encounter (HOSPITAL_COMMUNITY): Payer: Self-pay | Admitting: Emergency Medicine

## 2014-02-05 ENCOUNTER — Other Ambulatory Visit: Payer: Self-pay

## 2014-02-05 ENCOUNTER — Inpatient Hospital Stay (HOSPITAL_COMMUNITY)
Admission: EM | Admit: 2014-02-05 | Discharge: 2014-02-12 | DRG: 292 | Disposition: A | Discharge status: Home Health | Payer: Non-veteran care | Attending: Internal Medicine | Admitting: Internal Medicine

## 2014-02-05 ENCOUNTER — Emergency Department (HOSPITAL_COMMUNITY): Payer: Non-veteran care

## 2014-02-05 DIAGNOSIS — I482 Chronic atrial fibrillation, unspecified: Secondary | ICD-10-CM | POA: Diagnosis present

## 2014-02-05 DIAGNOSIS — R079 Chest pain, unspecified: Secondary | ICD-10-CM | POA: Diagnosis present

## 2014-02-05 DIAGNOSIS — E785 Hyperlipidemia, unspecified: Secondary | ICD-10-CM | POA: Diagnosis present

## 2014-02-05 DIAGNOSIS — I10 Essential (primary) hypertension: Secondary | ICD-10-CM | POA: Insufficient documentation

## 2014-02-05 DIAGNOSIS — Z9114 Patient's other noncompliance with medication regimen: Secondary | ICD-10-CM | POA: Diagnosis present

## 2014-02-05 DIAGNOSIS — Z7982 Long term (current) use of aspirin: Secondary | ICD-10-CM | POA: Diagnosis not present

## 2014-02-05 DIAGNOSIS — E119 Type 2 diabetes mellitus without complications: Secondary | ICD-10-CM | POA: Diagnosis present

## 2014-02-05 DIAGNOSIS — K861 Other chronic pancreatitis: Secondary | ICD-10-CM | POA: Diagnosis present

## 2014-02-05 DIAGNOSIS — I509 Heart failure, unspecified: Secondary | ICD-10-CM

## 2014-02-05 DIAGNOSIS — I429 Cardiomyopathy, unspecified: Secondary | ICD-10-CM | POA: Diagnosis not present

## 2014-02-05 DIAGNOSIS — R0602 Shortness of breath: Secondary | ICD-10-CM | POA: Diagnosis present

## 2014-02-05 DIAGNOSIS — I5023 Acute on chronic systolic (congestive) heart failure: Secondary | ICD-10-CM | POA: Diagnosis not present

## 2014-02-05 DIAGNOSIS — Z90411 Acquired partial absence of pancreas: Secondary | ICD-10-CM | POA: Diagnosis present

## 2014-02-05 DIAGNOSIS — I428 Other cardiomyopathies: Secondary | ICD-10-CM

## 2014-02-05 DIAGNOSIS — I5043 Acute on chronic combined systolic (congestive) and diastolic (congestive) heart failure: Secondary | ICD-10-CM | POA: Diagnosis present

## 2014-02-05 DIAGNOSIS — R0789 Other chest pain: Secondary | ICD-10-CM

## 2014-02-05 DIAGNOSIS — Z6841 Body Mass Index (BMI) 40.0 and over, adult: Secondary | ICD-10-CM | POA: Diagnosis not present

## 2014-02-05 DIAGNOSIS — Z9119 Patient's noncompliance with other medical treatment and regimen: Secondary | ICD-10-CM | POA: Diagnosis present

## 2014-02-05 LAB — URINALYSIS, ROUTINE W REFLEX MICROSCOPIC
Glucose, UA: NEGATIVE mg/dL
HGB URINE DIPSTICK: NEGATIVE
Ketones, ur: NEGATIVE mg/dL
Leukocytes, UA: NEGATIVE
Nitrite: NEGATIVE
PH: 6 (ref 5.0–8.0)
Protein, ur: >300 mg/dL — AB
SPECIFIC GRAVITY, URINE: 1.036 — AB (ref 1.005–1.030)
Urobilinogen, UA: 1 mg/dL (ref 0.0–1.0)

## 2014-02-05 LAB — CBC
HCT: 41.4 % (ref 39.0–52.0)
Hemoglobin: 12.6 g/dL — ABNORMAL LOW (ref 13.0–17.0)
MCH: 26.2 pg (ref 26.0–34.0)
MCHC: 30.4 g/dL (ref 30.0–36.0)
MCV: 86.1 fL (ref 78.0–100.0)
Platelets: 156 10*3/uL (ref 150–400)
RBC: 4.81 MIL/uL (ref 4.22–5.81)
RDW: 16.3 % — ABNORMAL HIGH (ref 11.5–15.5)
WBC: 4.3 10*3/uL (ref 4.0–10.5)

## 2014-02-05 LAB — BASIC METABOLIC PANEL
ANION GAP: 13 (ref 5–15)
BUN: 17 mg/dL (ref 6–23)
CALCIUM: 8.8 mg/dL (ref 8.4–10.5)
CO2: 25 meq/L (ref 19–32)
CREATININE: 0.68 mg/dL (ref 0.50–1.35)
Chloride: 107 mEq/L (ref 96–112)
GFR calc Af Amer: >90 mL/min (ref >90)
Glucose, Bld: 111 mg/dL — ABNORMAL HIGH (ref 70–99)
Potassium: 4.3 mEq/L (ref 3.7–5.3)
SODIUM: 145 meq/L (ref 137–147)

## 2014-02-05 LAB — I-STAT TROPONIN, ED: Troponin i, poc: 0.03 ng/mL (ref 0.00–0.08)

## 2014-02-05 LAB — CBG MONITORING, ED: GLUCOSE-CAPILLARY: 101 mg/dL — AB (ref 70–99)

## 2014-02-05 LAB — LIPASE, BLOOD: Lipase: 9 U/L — ABNORMAL LOW (ref 11–59)

## 2014-02-05 LAB — URINE MICROSCOPIC-ADD ON: Urine-Other: NONE SEEN

## 2014-02-05 LAB — PRO B NATRIURETIC PEPTIDE: Pro B Natriuretic peptide (BNP): 3632 pg/mL — ABNORMAL HIGH (ref 0–125)

## 2014-02-05 MED ORDER — ONDANSETRON HCL 4 MG/2ML IJ SOLN
4.0000 mg | Freq: Once | INTRAMUSCULAR | Status: AC
  Administered 2014-02-05: 4 mg via INTRAVENOUS
  Filled 2014-02-05: qty 2

## 2014-02-05 MED ORDER — METOPROLOL SUCCINATE ER 25 MG PO TB24
25.0000 mg | ORAL_TABLET | Freq: Every day | ORAL | Status: DC
  Administered 2014-02-06 – 2014-02-10 (×5): 25 mg via ORAL
  Filled 2014-02-05 (×5): qty 1

## 2014-02-05 MED ORDER — PANCRELIPASE (LIP-PROT-AMYL) 12000-38000 UNITS PO CPEP
24000.0000 [IU] | ORAL_CAPSULE | Freq: Three times a day (TID) | ORAL | Status: DC
Start: 2014-02-06 — End: 2014-02-12
  Administered 2014-02-05 – 2014-02-12 (×17): 24000 [IU] via ORAL
  Filled 2014-02-05 (×22): qty 2

## 2014-02-05 MED ORDER — MORPHINE SULFATE 4 MG/ML IJ SOLN
4.0000 mg | Freq: Once | INTRAMUSCULAR | Status: AC
  Administered 2014-02-05: 4 mg via INTRAVENOUS
  Filled 2014-02-05: qty 1

## 2014-02-05 MED ORDER — ASPIRIN 81 MG PO TABS: 81.0000 mg | ORAL_TABLET | Freq: Every day | ORAL | Status: DC

## 2014-02-05 MED ORDER — ONDANSETRON HCL 4 MG PO TABS: 4.0000 mg | ORAL_TABLET | Freq: Four times a day (QID) | ORAL | Status: DC | PRN

## 2014-02-05 MED ORDER — FOLIC ACID 1 MG PO TABS
1.0000 mg | ORAL_TABLET | Freq: Every day | ORAL | Status: DC
  Administered 2014-02-09 – 2014-02-11 (×3): 1 mg via ORAL
  Filled 2014-02-05 (×7): qty 1

## 2014-02-05 MED ORDER — KETOROLAC TROMETHAMINE 30 MG/ML IJ SOLN
30.0000 mg | Freq: Once | INTRAMUSCULAR | Status: AC
  Administered 2014-02-05: 30 mg via INTRAVENOUS
  Filled 2014-02-05: qty 1

## 2014-02-05 MED ORDER — ONDANSETRON HCL 4 MG/2ML IJ SOLN
4.0000 mg | Freq: Four times a day (QID) | INTRAMUSCULAR | Status: DC | PRN
  Administered 2014-02-06 – 2014-02-07 (×2): 4 mg via INTRAVENOUS
  Filled 2014-02-05 (×3): qty 2

## 2014-02-05 MED ORDER — DIGOXIN 125 MCG PO TABS
125.0000 ug | ORAL_TABLET | Freq: Every day | ORAL | Status: DC
  Administered 2014-02-06 – 2014-02-12 (×7): 125 ug via ORAL
  Filled 2014-02-05 (×8): qty 1

## 2014-02-05 MED ORDER — PANCRELIPASE (LIP-PROT-AMYL) 12000-38000 UNITS PO CPEP: 24000.0000 [IU] | ORAL_CAPSULE | Freq: Once | ORAL | Status: DC

## 2014-02-05 MED ORDER — ACETAMINOPHEN 650 MG RE SUPP: 650.0000 mg | Freq: Four times a day (QID) | RECTAL | Status: DC | PRN

## 2014-02-05 MED ORDER — ENOXAPARIN SODIUM 80 MG/0.8ML ~~LOC~~ SOLN
70.0000 mg | Freq: Every day | SUBCUTANEOUS | Status: DC
  Filled 2014-02-05 (×7): qty 0.8

## 2014-02-05 MED ORDER — PANTOPRAZOLE SODIUM 40 MG PO TBEC
40.0000 mg | DELAYED_RELEASE_TABLET | Freq: Two times a day (BID) | ORAL | Status: DC
  Administered 2014-02-05 – 2014-02-12 (×9): 40 mg via ORAL
  Filled 2014-02-05 (×20): qty 1

## 2014-02-05 MED ORDER — FUROSEMIDE 10 MG/ML IJ SOLN
60.0000 mg | Freq: Once | INTRAMUSCULAR | Status: AC
  Administered 2014-02-05: 60 mg via INTRAVENOUS
  Filled 2014-02-05: qty 8

## 2014-02-05 MED ORDER — HYDROMORPHONE HCL 1 MG/ML IJ SOLN
1.0000 mg | INTRAMUSCULAR | Status: DC | PRN
  Administered 2014-02-05 – 2014-02-07 (×7): 1 mg via INTRAVENOUS
  Filled 2014-02-05 (×7): qty 1

## 2014-02-05 MED ORDER — LISINOPRIL 5 MG PO TABS
5.0000 mg | ORAL_TABLET | Freq: Every day | ORAL | Status: DC
  Administered 2014-02-06 – 2014-02-07 (×2): 5 mg via ORAL
  Filled 2014-02-05 (×3): qty 1

## 2014-02-05 MED ORDER — GUAIFENESIN ER 600 MG PO TB12
600.0000 mg | ORAL_TABLET | Freq: Two times a day (BID) | ORAL | Status: DC
  Administered 2014-02-05 – 2014-02-12 (×8): 600 mg via ORAL
  Filled 2014-02-05 (×15): qty 1

## 2014-02-05 MED ORDER — INSULIN ASPART 100 UNIT/ML ~~LOC~~ SOLN
0.0000 [IU] | Freq: Three times a day (TID) | SUBCUTANEOUS | Status: DC
  Administered 2014-02-06 – 2014-02-07 (×2): 1 [IU] via SUBCUTANEOUS
  Administered 2014-02-07: 2 [IU] via SUBCUTANEOUS
  Administered 2014-02-08: 1 [IU] via SUBCUTANEOUS
  Administered 2014-02-08: 2 [IU] via SUBCUTANEOUS
  Administered 2014-02-09: 1 [IU] via SUBCUTANEOUS
  Administered 2014-02-09: 2 [IU] via SUBCUTANEOUS
  Administered 2014-02-09 – 2014-02-10 (×3): 1 [IU] via SUBCUTANEOUS
  Administered 2014-02-10: 2 [IU] via SUBCUTANEOUS
  Administered 2014-02-11: 3 [IU] via SUBCUTANEOUS
  Administered 2014-02-11: 1 [IU] via SUBCUTANEOUS
  Administered 2014-02-11 – 2014-02-12 (×3): 2 [IU] via SUBCUTANEOUS

## 2014-02-05 MED ORDER — PANCRELIPASE (LIP-PROT-AMYL) 12000-38000 UNITS PO CPEP: 24000.0000 [IU] | ORAL_CAPSULE | Freq: Three times a day (TID) | ORAL | Status: DC

## 2014-02-05 MED ORDER — OXYCODONE HCL 5 MG PO TABS
10.0000 mg | ORAL_TABLET | Freq: Four times a day (QID) | ORAL | Status: DC | PRN
  Administered 2014-02-06: 10 mg via ORAL
  Filled 2014-02-05 (×4): qty 2

## 2014-02-05 MED ORDER — PROMETHAZINE HCL 25 MG PO TABS
25.0000 mg | ORAL_TABLET | Freq: Three times a day (TID) | ORAL | Status: DC | PRN
  Administered 2014-02-06: 25 mg via ORAL
  Filled 2014-02-05: qty 1

## 2014-02-05 MED ORDER — SODIUM CHLORIDE 0.9 % IJ SOLN
3.0000 mL | Freq: Two times a day (BID) | INTRAMUSCULAR | Status: DC
  Administered 2014-02-06 – 2014-02-12 (×14): 3 mL via INTRAVENOUS

## 2014-02-05 MED ORDER — ACETAMINOPHEN 325 MG PO TABS
650.0000 mg | ORAL_TABLET | Freq: Four times a day (QID) | ORAL | Status: DC | PRN
  Administered 2014-02-06 – 2014-02-11 (×5): 650 mg via ORAL
  Filled 2014-02-05 (×6): qty 2

## 2014-02-05 MED ORDER — FUROSEMIDE 10 MG/ML IJ SOLN
40.0000 mg | Freq: Two times a day (BID) | INTRAMUSCULAR | Status: DC
  Administered 2014-02-06 – 2014-02-07 (×3): 40 mg via INTRAVENOUS
  Filled 2014-02-05 (×3): qty 4

## 2014-02-05 MED ORDER — VITAMIN B-1 100 MG PO TABS
100.0000 mg | ORAL_TABLET | Freq: Every day | ORAL | Status: DC
  Administered 2014-02-09 – 2014-02-12 (×4): 100 mg via ORAL
  Filled 2014-02-05 (×7): qty 1

## 2014-02-05 MED ORDER — SODIUM CHLORIDE 0.9 % IJ SOLN
3.0000 mL | Freq: Two times a day (BID) | INTRAMUSCULAR | Status: DC
  Administered 2014-02-06 – 2014-02-12 (×9): 3 mL via INTRAVENOUS

## 2014-02-05 MED ORDER — ASPIRIN EC 325 MG PO TBEC
325.0000 mg | DELAYED_RELEASE_TABLET | Freq: Every day | ORAL | Status: DC
  Administered 2014-02-06 – 2014-02-12 (×7): 325 mg via ORAL
  Filled 2014-02-05 (×7): qty 1

## 2014-02-05 NOTE — Progress Notes (Signed)
Pt admitted to the unit and transferred to bed. Oriented to room, call light, and staff. VSS at this time. No distress noted. Admitting MD at bedside. Will continue to monitor pt and carry out POC.

## 2014-02-05 NOTE — ED Notes (Signed)
Pt refusing 2nd ekg, requesting pain med.   Notified edp

## 2014-02-05 NOTE — ED Notes (Signed)
Bed: [facility] Expected date:  Expected time:  Means of arrival:  Comments: Ems 58 yo, lower abdominal pain

## 2014-02-05 NOTE — ED Provider Notes (Signed)
CSN: [id number]     Arrival date & time 02/05/14  1744 History   First MD Initiated Contact with Patient 02/05/14 1917     Chief Complaint  Patient presents with  . Chest Pain    thoracic pain  . Abdominal Pain    hernia pain   (Consider location/radiation/quality/duration/timing/severity/associated sxs/prior Treatment) HPI Don Brown is a 58 yo male presenting with continuous chest, back and abd pain that have worsened x 4 days.  He reports driving to the [place] and feeling a sharp pain in his chest and through to his back.  He points to his right chest and states it radiates through to his right upper back.  He does not describe or rate the pain but states "it's a pain that hurts".  He also states his shortness of breath has become worse over the last several days also and notes that he is getting short of breath with walking or moving.  He endorses some nausea and chills but denies vomiting, diarrhea or fevers.  Past Medical History  Diagnosis Date  . 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 Atrial fibrillation   . Chronic systolic heart failure     LVEF 40-45%  . Morbid obesity   . Chronic pain   . Narcotic dependence, episodic use   . Respiratory failure     History of ventilatory-dependent respiratory failure and tracheostomy   . History of renal failure     Requiring hemodialysis in the past with normal renal function at this point  . History of ventral hernia repair   . Cervical compression fracture     Cervical disk disease requiring surgery  . Essential hypertension, benign   . Hyperlipidemia   . Degenerative joint disease   . Heart murmur   . MGUS (monoclonal gammopathy of unknown significance)   . Type 2 diabetes mellitus    Past Surgical History  Procedure Laterality Date  . Ventral hernia repair    . Cholecystectomy    .  Tracheostomy    . Pancreas surgery    . Skin graft    . Insertion of mesh     Family History  Problem Relation Age of Onset  . Hypertension Mother   . Diabetes Mother   . Lymphoma Father    History  Substance Use Topics  . Smoking status: Never Smoker   . Smokeless tobacco: Never Used  . Alcohol Use: No    Review of Systems  Constitutional: Positive for chills. Negative for fever.  HENT: Negative for sore throat.   Eyes: Negative for visual disturbance.  Respiratory: Positive for shortness of breath. Negative for cough.   Cardiovascular: Positive for chest pain. Negative for leg swelling.  Gastrointestinal: Positive for nausea and abdominal pain. Negative for vomiting and diarrhea.  Genitourinary: Negative for dysuria.  Musculoskeletal: Negative for myalgias.  Skin: Negative for rash.  Neurological: Negative for weakness, numbness and headaches.    Allergies  Morphine and related  Home Medications   Prior to Admission medications   Medication Sig Start Date End Date Taking? Authorizing Provider  acetaminophen (TYLENOL) 325 MG tablet Take 2 tablets (650 mg total) by mouth every 6 (six) hours as needed (for pain). 10/06/13  Yes Clinton Gallant, MD  Amylase-Lipase-Protease (PANCREASE PO) Take 2 capsules by mouth 3 (three) times daily with meals.    Yes Historical Provider, MD  aspirin 81 MG tablet Take 81 mg by mouth daily before breakfast.    Yes Historical Provider, MD  Cyanocobalamin (VITAMIN B 12 PO) Take 2 tablets by mouth daily.    Yes Historical Provider, MD  digoxin (LANOXIN) 0.125 MG tablet Take 125 mcg by mouth every morning.    Yes Historical Provider, MD  folic acid (FOLVITE) 1 MG tablet Take 1 tablet (1 mg total) by mouth daily. 05/17/13  Yes Thurnell Lose, MD  furosemide (LASIX) 40 MG tablet Take 1 tablet (40 mg total) by mouth 2 (two) times daily. Fluid 07/10/13  Yes Adeline C Viyuoh, MD  glucose monitoring kit (FREESTYLE) monitoring kit 1 each by Does not apply  route 4 (four) times daily - after meals and at bedtime. 1 month Diabetic Testing Supplies for QAC-QHS accuchecks.Any brand OK 05/17/13  Yes Thurnell Lose, MD  Nyoka Cowden Tea, Camillia sinensis, (GREEN TEA PO) Take 2 tablets by mouth daily.    Yes Historical Provider, MD  insulin aspart (NOVOLOG) 100 UNIT/ML injection Before each meal 3 times a day and then bedtime, 140-199 - 2 units, 200-250 - 4 units, 251-299 - 6 units,  300-349 - 8 units,  350 or above 10 units. Dispense syringes and needles as needed, Ok to switch to PEN if approved. Okay to switch to any approved short-acting insulin. 05/17/13  Yes Thurnell Lose, MD  lisinopril (PRINIVIL,ZESTRIL) 5 MG tablet Take 1 tablet (5 mg total) by mouth daily. 07/10/13  Yes Adeline Saralyn Pilar, MD  metoprolol succinate (TOPROL-XL) 25 MG 24 hr tablet Take 1 tablet (25 mg total) by mouth every morning. 07/10/13  Yes Adeline Saralyn Pilar, MD  oxyCODONE 10 MG TABS Take 1 tablet (10 mg total) by mouth every 6 (six) hours as needed for severe pain. 10/06/13  Yes Clinton Gallant, MD  pantoprazole (PROTONIX) 40 MG tablet Take 1 tablet (40 mg total) by mouth 2 (two) times daily. 05/17/13  Yes Thurnell Lose, MD  promethazine (PHENERGAN) 25 MG tablet Take 25 mg by mouth every 8 (eight) hours as needed for nausea or vomiting.   Yes Historical Provider, MD  thiamine 100 MG tablet Take 1 tablet (100 mg total) by mouth daily. 05/17/13  Yes Thurnell Lose, MD  guaiFENesin (MUCINEX) 600 MG 12 hr tablet Take 1 tablet (600 mg total) by mouth 2 (two) times daily. 10/06/13   Clinton Gallant, MD  ibuprofen (ADVIL,MOTRIN) 200 MG tablet Take 400 mg by mouth every 6 (six) hours as needed for headache or moderate pain.    Historical Provider, MD   BP 179/96 mmHg  Pulse 82  Temp(Src) 98.4 F (36.9 C) (Oral)  Resp 22  SpO2 96% Physical Exam  Constitutional: He is oriented to person, place, and time. He appears well-developed and well-nourished. No distress.  HENT:  Head: Normocephalic and  atraumatic.  Mouth/Throat: Oropharynx is clear and moist. No oropharyngeal exudate.  Eyes: Conjunctivae are normal.  Neck: Neck supple. No thyromegaly present.  Cardiovascular: Normal rate, regular rhythm and intact distal pulses.   Pulmonary/Chest: Effort normal. No respiratory distress. He has no decreased breath sounds. He has no wheezes. He has no rhonchi. He has rales in the right lower field and the left lower field.   He exhibits no tenderness.    Pt is TTP in rt upper chest and rt upper back  Abdominal: Soft. There is generalized tenderness. There is no rigidity, no rebound, no guarding, no CVA tenderness, no tenderness at McBurney's point and  negative Murphy's sign.  Musculoskeletal: He exhibits no tenderness.  Lymphadenopathy:    He has no cervical adenopathy.  Neurological: He is alert and oriented to person, place, and time.  Skin: Skin is warm and dry. No rash noted. He is not diaphoretic.  Psychiatric: He has a normal mood and affect.  Nursing note and vitals reviewed.   ED Course  Procedures (including critical care time) Labs Review Labs Reviewed  CBC - Abnormal; Notable for the following:    Hemoglobin 12.6 (*)    RDW 16.3 (*)    All other components within normal limits  BASIC METABOLIC PANEL - Abnormal; Notable for the following:    Glucose, Bld 111 (*)    All other components within normal limits  PRO B NATRIURETIC PEPTIDE - Abnormal; Notable for the following:    Pro B Natriuretic peptide (BNP) 3632.0 (*)    All other components within normal limits  LIPASE, BLOOD - Abnormal; Notable for the following:    Lipase 9 (*)    All other components within normal limits  URINALYSIS, ROUTINE W REFLEX MICROSCOPIC - Abnormal; Notable for the following:    Color, Urine AMBER (*)    Specific Gravity, Urine 1.036 (*)    Bilirubin Urine SMALL (*)    Protein, ur >300 (*)    All other components within normal limits  URINE MICROSCOPIC-ADD ON  Randolm Idol, ED     Imaging Review Dg Chest 2 View  02/05/2014   CLINICAL DATA:  Shortness of breath, RIGHT thoracic pain for 3 days worsened with palpation and movement, RIGHT lower quadrant pain, history atrial fibrillation, chronic systolic heart failure, obesity, renal failure, benign essential hypertension, type 2 diabetes  EXAM: CHEST  2 VIEW  COMPARISON:  10/03/2013  FINDINGS: Enlargement of cardiac silhouette with pulmonary vascular congestion.  Despite lighter technique, BILATERAL perihilar infiltrates present likely edema.  Chronic elevation of RIGHT diaphragm.  No gross pleural effusion or pneumothorax.  IMPRESSION: Enlargement of cardiac silhouette with pulmonary vascular congestion and probable perihilar edema.   Electronically Signed   By: Lavonia Dana M.D.   On: 02/05/2014 21:17     EKG Interpretation   Date/Time:  Monday February 05 2014 20:33:25 EST Ventricular Rate:  72 PR Interval:    QRS Duration: 122 QT Interval:  409 QTC Calculation: 448 R Axis:   41 Text Interpretation:  Atrial fibrillation Ventricular premature complex  Nonspecific intraventricular conduction delay Nonspecific T abnrm,  anterolateral leads no  change from old Confirmed by Johnney Killian, [place]  [phone]) on 02/05/2014 9:24:38 PM      MDM   Final diagnoses:  Shortness of breath  CHF exacerbation   58 yo male with chest pain, abd pain and worsening shortness of breath on exertion.  Pt wears 2 LNC at home and last EF estimated 20-25%.  Consider CHF exacerbation vs ACS vs pancreatitis.    CBC, BMP, BNP, Lipase, Troponin, UA, EKG and CXR.  Pain and nausea meds given. Discussed case with Dr. Johnney Killian  Significant labs include BNP: 3632,   CXR: pulmonary vascular congestion and probable perihilar edema.  9:50 PM: Consulted with Dr. Hal Hope (hospitalist), will admit to telemetry bed. Lasix and pain meds given.  The patient appears reasonably stabilized for admission considering the current resources, flow, and  capabilities available in the ED at this time, and I doubt any screening and/or treatment in the ED prior to admission.  Filed Vitals:   02/05/14 1941  BP: 179/96  Pulse: 82  Temp: 98.4 F (36.9 C)  TempSrc: Oral  Resp: 22  SpO2: 96%   Meds given in ED:  Medications  ondansetron (ZOFRAN) injection 4 mg (4 mg Intravenous Given 02/05/14 1814)  ketorolac (TORADOL) 30 MG/ML injection 30 mg (30 mg Intravenous Given 02/05/14 2031)  furosemide (LASIX) injection 60 mg (60 mg Intravenous Given 02/05/14 2204)  morphine 4 MG/ML injection 4 mg (4 mg Intravenous Given 02/05/14 2202)  ondansetron (ZOFRAN) injection 4 mg (4 mg Intravenous Given 02/05/14 2202)    New Prescriptions   No medications on file       Britt Bottom, NP 02/07/14 [place], MD 02/07/14 [phone]

## 2014-02-05 NOTE — ED Notes (Addendum)
Pt from home. Per ems c/o right thoracic pain for three days. Pain worsening with palpation and movement. He also co RLQ pain . Pt was dx with hytal hernia and pancreatitis. Pt is alert and oriented x4. Pt is on home O2 2l [place], Hx CHF

## 2014-02-05 NOTE — H&P (Signed)
[facility] History and Physical  Don Brown MRN:[id number] DOB: 12/06/1955 DOA: 02/05/2014  Referring physician:ER physician. [facility]  Chief Complaint: Shortness of breath and chest pain.  HPI: Don Brown is a 57 y.o. male with history of chronic systolic heart failure last E EF measured in February this year was 25-30%, chronic pancreatitis status post partial pancreatectomy and stent placement, diabetes mellitus type 2, atrial fibrillation, MGUS presents to the ER because of chest pain and shortness of breath. Patient states his symptoms started on Thursday 4 days ago with chest pain and gradually patient started developing shortness of breath. Patient did [facility] on Thursday. Patient stated over the weekend patient became very short of breath and had come to the ER today. Patient states he has been taking his Lasix as advised. Patient chest pain is retrosternal radiating to his back. Pain improved with morphine. Has occasional nausea denies any vomiting or abdominal pain denies any fever chills productive cough or any diarrhea. Chest x-ray in the ER shows congestion compatible with CHF. Patient was given Lasix 60 mg IV and admitted for further management.  Review of Systems: As presented in the history of presenting illness, rest negative.  Past Medical History  Diagnosis Date  . 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 Atrial fibrillation   . Chronic systolic heart failure     LVEF 40-45%  . Morbid obesity   . Chronic pain   . Narcotic dependence, episodic use   . Respiratory failure     History of ventilatory-dependent respiratory failure and tracheostomy   . History of renal failure     Requiring hemodialysis in the past with normal renal function at this point  . History of  ventral hernia repair   . Cervical compression fracture     Cervical disk disease requiring surgery  . Essential hypertension, benign   . Hyperlipidemia   . Degenerative joint disease   . Heart murmur   . MGUS (monoclonal gammopathy of unknown significance)   . Type 2 diabetes mellitus    Past Surgical History  Procedure Laterality Date  . Ventral hernia repair    . Cholecystectomy    . Tracheostomy    . Pancreas surgery    . Skin graft    . Insertion of mesh     Social History:  reports that he has never smoked. He has never used smokeless tobacco. He reports that he does not drink alcohol or use illicit drugs. Where does patient live home. Can patient participate in ADLs? Yes.  Allergies  Allergen Reactions  . Morphine And Related Nausea And Vomiting    Family History:  Family History  Problem Relation Age of Onset  . Hypertension Mother   . Diabetes Mother   . Lymphoma Father       Prior to Admission medications   Medication Sig Start Date End Date Taking? Authorizing Provider  acetaminophen (TYLENOL) 325 MG tablet Take 2 tablets (650 mg total) by mouth every 6 (six) hours as needed (for pain). 10/06/13  Yes Nora Sadek, MD  Amylase-Lipase-Protease (PANCREASE PO) Take 2 capsules by mouth 3 (three) times daily with meals.    Yes Historical Provider, MD  aspirin 81 MG tablet Take 81 mg by mouth daily before breakfast.    Yes Historical Provider, MD    Cyanocobalamin (VITAMIN B 12 PO) Take 2 tablets by mouth daily.    Yes Historical Provider, MD  digoxin (LANOXIN) 0.125 MG tablet Take 125 mcg by mouth every morning.    Yes Historical Provider, MD  folic acid (FOLVITE) 1 MG tablet Take 1 tablet (1 mg total) by mouth daily. 05/17/13  Yes Thurnell Lose, MD  furosemide (LASIX) 40 MG tablet Take 1 tablet (40 mg total) by mouth 2 (two) times daily. Fluid 07/10/13  Yes Adeline C Viyuoh, MD  glucose monitoring kit (FREESTYLE) monitoring kit 1 each by Does not apply route 4 (four)  times daily - after meals and at bedtime. 1 month Diabetic Testing Supplies for QAC-QHS accuchecks.Any brand OK 05/17/13  Yes Thurnell Lose, MD  Nyoka Cowden Tea, Camillia sinensis, (GREEN TEA PO) Take 2 tablets by mouth daily.    Yes Historical Provider, MD  insulin aspart (NOVOLOG) 100 UNIT/ML injection Before each meal 3 times a day and then bedtime, 140-199 - 2 units, 200-250 - 4 units, 251-299 - 6 units,  300-349 - 8 units,  350 or above 10 units. Dispense syringes and needles as needed, Ok to switch to PEN if approved. Okay to switch to any approved short-acting insulin. 05/17/13  Yes Thurnell Lose, MD  lisinopril (PRINIVIL,ZESTRIL) 5 MG tablet Take 1 tablet (5 mg total) by mouth daily. 07/10/13  Yes Adeline Saralyn Pilar, MD  metoprolol succinate (TOPROL-XL) 25 MG 24 hr tablet Take 1 tablet (25 mg total) by mouth every morning. 07/10/13  Yes Adeline Saralyn Pilar, MD  oxyCODONE 10 MG TABS Take 1 tablet (10 mg total) by mouth every 6 (six) hours as needed for severe pain. 10/06/13  Yes Clinton Gallant, MD  pantoprazole (PROTONIX) 40 MG tablet Take 1 tablet (40 mg total) by mouth 2 (two) times daily. 05/17/13  Yes Thurnell Lose, MD  promethazine (PHENERGAN) 25 MG tablet Take 25 mg by mouth every 8 (eight) hours as needed for nausea or vomiting.   Yes Historical Provider, MD  thiamine 100 MG tablet Take 1 tablet (100 mg total) by mouth daily. 05/17/13  Yes Thurnell Lose, MD  guaiFENesin (MUCINEX) 600 MG 12 hr tablet Take 1 tablet (600 mg total) by mouth 2 (two) times daily. 10/06/13   Clinton Gallant, MD  ibuprofen (ADVIL,MOTRIN) 200 MG tablet Take 400 mg by mouth every 6 (six) hours as needed for headache or moderate pain.    Historical Provider, MD    Physical Exam: Filed Vitals:   02/05/14 1941 02/05/14 2030 02/05/14 2113 02/05/14 2231  BP: 179/96  141/87 163/84  Pulse: 82 81 78 78  Temp: 98.4 F (36.9 C)   97.7 F (36.5 C)  TempSrc: Oral   Oral  Resp: _0 Height:    6' (1.829 m)  Weight:     144.607 kg (318 lb 12.8 oz)  SpO2: 96% 91% 93% 94%     General:  Obese not in acute distress.  Eyes: anicteric no pallor.  ENT: no discharge from the ears eyes nose and mouth.  Neck: no mass felt. JVD not appreciated.  Cardiovascular: S1-S2 heard.  Respiratory: no rhonchi or crepitations.  Abdomen: soft nontender bowel sounds present. Scar seen across the abdomen.  Skin: scar seen across the abdomen.  Musculoskeletal: no edema.  Psychiatric: appears normal.  Neurologic: alert awake oriented to time place and person. Moves all extremities.  Labs on Admission:  Basic Metabolic Panel:  Recent Labs Lab 02/05/14 1807  NA 145  K 4.3  CL 107  CO2 25  GLUCOSE 111*  BUN 17  CREATININE 0.68  CALCIUM 8.8   Liver Function Tests: No results for input(s): AST, ALT, ALKPHOS, BILITOT, PROT, ALBUMIN in the last 168 hours.  Recent Labs Lab 02/05/14 1807  LIPASE 9*   No results for input(s): AMMONIA in the last 168 hours. CBC:  Recent Labs Lab 02/05/14 1807  WBC 4.3  HGB 12.6*  HCT 41.4  MCV 86.1  PLT 156   Cardiac Enzymes: No results for input(s): CKTOTAL, CKMB, CKMBINDEX, TROPONINI in the last 168 hours.  BNP (last 3 results)  Recent Labs  07/07/13 1205 07/08/13 0447 02/05/14 1807  PROBNP 5002.0* 3045.0* 3632.0*   CBG:  Recent Labs Lab 02/05/14 1810  GLUCAP 101*    Radiological Exams on Admission: Dg Chest 2 View  02/05/2014   CLINICAL DATA:  Shortness of breath, RIGHT thoracic pain for 3 days worsened with palpation and movement, RIGHT lower quadrant pain, history atrial fibrillation, chronic systolic heart failure, obesity, renal failure, benign essential hypertension, type 2 diabetes  EXAM: CHEST  2 VIEW  COMPARISON:  10/03/2013  FINDINGS: Enlargement of cardiac silhouette with pulmonary vascular congestion.  Despite lighter technique, BILATERAL perihilar infiltrates present likely edema.  Chronic elevation of RIGHT diaphragm.  No gross pleural  effusion or pneumothorax.  IMPRESSION: Enlargement of cardiac silhouette with pulmonary vascular congestion and probable perihilar edema.   Electronically Signed   By: Lavonia Dana M.D.   On: 02/05/2014 21:17    EKG: Independently reviewed. Atrial fibrillation rate control.  Assessment/Plan Active Problems:   Nonischemic cardiomyopathy   Chronic pancreatitis   CHF (congestive heart failure)   CHF exacerbation   Chest pain   Chronic atrial fibrillation   Diabetes mellitus type 2, controlled   1. Decompensated CHF last EF measured was 25-30% in February 2015 - patient did receive Lasix 60 mg IV in the ER and I have placed on 40 mg IV every 12 hourly. Closely follow intake output and metabolic, daily weights. 2. Chest pain - patient states his chest pain is sometimes very excruciating and radiates to his back and between his shoulder blades. Have ordered CT angiogram of the chest to rule out dissection. Cycle cardiac markers. Aspirin. 3. Atrial fibrillation rate controlled - on metoprolol and digoxin. Patient states he is not on anticoagulants as he has required multiple procedures and was told to be off anticoagulants and patient is on aspirin. 4. Diabetes mellitus type 2 - continue home medications with close follow-up of CBGs with sliding scale coverage. 5. History of chronic pancreatitis status post partial pancreatectomy and history of stent placement - denies abdominal pain and lipase was within acceptable limits. On enzyme replacement. 6. History of MGUS - being followed by oncologist as outpatient.    Code Status: full code.  Family Communication:none.  Disposition Plan: admit to inpatient.    Newman Waren N. [facility] Pager [phone].  If 7PM-7AM, please contact night-coverage [email or web address] Password [email or web address] 02/05/2014, 11:11 PM

## 2014-02-05 NOTE — Progress Notes (Signed)
[place]ley requested to adjust Lovenox dose as needed for VTE prophylaxis  Height = 72 inches Weight = 144.6 kg BMI = 43 CrCl > 120 ml/min  Assessment:  BMI > 30 and CrCl > 30 ml/min; therefore will adjust Lovenox dose to 0.5 mg/kg/q24h  Plan:  Change Lovenox to 70 mg sq q24h           Will continue to follow  Leone Haven, PharmD 02/05/14 @ 23:34

## 2014-02-06 ENCOUNTER — Encounter (HOSPITAL_COMMUNITY): Payer: Self-pay

## 2014-02-06 ENCOUNTER — Inpatient Hospital Stay (HOSPITAL_COMMUNITY): Payer: Non-veteran care

## 2014-02-06 LAB — TROPONIN I
Troponin I: <0.3 ng/mL (ref <0.30)
Troponin I: <0.3 ng/mL (ref <0.30)

## 2014-02-06 LAB — GLUCOSE, CAPILLARY
GLUCOSE-CAPILLARY: 123 mg/dL — AB (ref 70–99)
GLUCOSE-CAPILLARY: 123 mg/dL — AB (ref 70–99)
GLUCOSE-CAPILLARY: 155 mg/dL — AB (ref 70–99)
Glucose-Capillary: 136 mg/dL — ABNORMAL HIGH (ref 70–99)

## 2014-02-06 LAB — DIGOXIN LEVEL: Digoxin Level: 0.4 ng/mL — ABNORMAL LOW (ref 0.8–2.0)

## 2014-02-06 MED ORDER — INSULIN GLARGINE 100 UNIT/ML ~~LOC~~ SOLN
10.0000 [IU] | Freq: Once | SUBCUTANEOUS | Status: AC
  Administered 2014-02-06: 10 [IU] via SUBCUTANEOUS
  Filled 2014-02-06: qty 0.1

## 2014-02-06 MED ORDER — IOHEXOL 350 MG/ML SOLN
100.0000 mL | Freq: Once | INTRAVENOUS | Status: AC | PRN
  Administered 2014-02-06: 100 mL via INTRAVENOUS

## 2014-02-06 NOTE — Care Management Note (Addendum)
Page 1 of 2   02/12/2014     2:26:12 PM CARE MANAGEMENT NOTE 02/12/2014  Patient:  [facility]   Account Number:  [id number]  Date Initiated:  02/06/2014  Documentation initiated by:  Dessa Phi  Subjective/Objective Assessment:   58 Y/O M ADMITTED W/CHF.     Action/Plan:   FROM HOME W/MINOR KIDS.HAS CANE,[facility]P-[facility] W/[facility]-DR. PENTCO.   Anticipated DC Date:  02/12/2014   Anticipated DC Plan:  [place]  CM consult      Choice offered to / List presented to:  C-1 Patient   DME arranged  BEDSIDE COMMODE      DME agency  [facility]     [place] arranged  HH-1 RN  [place]      [place] agency  [place]   Status of service:  Completed, signed off Medicare Important Message given?   (If response is "NO", the following Medicare IM given date fields will be blank) Date Medicare IM given:   Medicare IM given by:   Date Additional Medicare IM given:   Additional Medicare IM given by:    Discharge Disposition:  [place]  Per UR Regulation:  Reviewed for med. necessity/level of care/duration of stay  If discussed at [place] of Stay Meetings, dates discussed:    Comments:  02/12/14 Jaedyn Lard RN,BSN NCM [place].[facility] CHOSEN-TC MARY REP, AWARE OF [place].PROVIDED HER WITH [facility] AUTH CONTACT PERSON-[facility] SEE BELOW.FAXED ALL HHC ORDERS, & 3N1 ORDER(PATIENT CURRENTLY DECLINES RECEIVING) TO VA SW QUIANNA MOCK  SEE TEL# BELOW W/CONFIRMATION.[facility] SW WILL CONTACT PATIENT TO F/U ON 3N1.PATIENT VOICED UNDERSTANDING.  02/09/14 Victoriya Pol RN,BSN NCM [phone] 8:30P RECEIVED CALL FROM [facility] W/S [place] ASKING ABOUT REFUSAL TO TRANSFER FORM,INFORMED HER THAT FORM WAS FAXED RECEIVED CONFIRMATION.SHE JUST NEEDED TO KNOW WHERE FAXED FORM WENT.  SPOKE TO PATIENT IN LENGTH ABOUT TRANSFER TO PROCESS TO THE  VA.PATIENT SIGNED TO TRANSFER.PATIENT ALL INFO WENT TO [place] TRANSFER COORDIN #[phone] X [place] W/CONFIRMATION,MD HAD PEER TO PEER W/VA [place] TO [facility] W/S [place].RECEIVED CALL FROM NURSE JESSICA THAT PATIENT WAS REFUSING.PATIENT PUT ON SPEAKER PHONE W/VA W/S [place] CHARGE NURSE FRANK TEL#[phone] [place] TO PATIENT THAT IF HE DID NOT TRANSFER WHICH IS HIS RIGHT, HE COULD BE AT RISK TO PAY THE BILL.PATIENT VOICED UNDERSTANDING & SAID HE IS WILLING TO ACCEPT RISK.FRANK FROM [facility] WILL CONTACT TRANSPORTION TO CANCEL TRANSFER.MD NOTIFIED.[facility] HAS ALREADY RECEIVED AUTH FOR [place] [facility] REP AWARE.BUT PATIENT STILL HAS NOT CHOSEN A HHC AGENCY.  02/08/14 Ladena Jacquez RN,BSN NCM [phone] PER VA W/S SW QUIANNA KTGY [phone] [id number]-INFORMED ABOUT D/C NEEDS-TRANSPORTATION-DAV([facility])AWAIT PT RECOMMENDATIONS.PORDERED FOR HHRN-CHF DZ MGMNT.PROVIDED PATIENT [place] AGENCY LIST-HE STATED THAT HE DIDN'T KNOW WHY HE NEEDED INSTRUCTION ON CHF WHEN HE ALREADY KNOWS ABOUT IT.MD NOTIFIED.PATIENT PCP-DR. AHUNNA OKWUBUNKA-ANYIM.TC TRANSFER COORDINATOR-APRIL ALEXANDER-ASKED WHT IS THE PROCESS FOR TRANSFER TO VA.SHE STATED THAT IF D/C IS WITHIN 48HRS, THEY ARE UNABLE TO APPROVE TRANSFER.MD NOTIFIED.IF PATIENT DOES RECEIVE HHC THE VA CONTACT PERSON FOR AUTH IS: [place] [phone].IF HHC NEEDED PLEASE FAX ORDERS,PROGRESS NOTES TO W/S [facility] ATTN:DR. ANYIM(PCP).  02/06/14 Kennidee Heyne RN,BSN NCM [place] LEFT VM TEL#[phone]  [id number] APRIL,AWAIT RETURN CALL-INFORMED OF ADMISSION,D/C PLANS,DTR W/DIFFICULTY W/TRANSPORTATION FOR PATIENT.AWAIT PT RECOMMENDATIONS.

## 2014-02-06 NOTE — Progress Notes (Addendum)
Patient asked RN to keep the bed alarm off and patient said that he would put the oxygen on and off as he needed, he said he did that at home.  Patient asked if we (staff)  were the "Gestapo".  RN reassured patient that we were trying to keep the patient safe and well.  Currently the bed alarm is off (patient stated that he could walk "just fine"). Patient is currently wearing the oxygen.

## 2014-02-06 NOTE — Progress Notes (Signed)
[facility] PROGRESS NOTE  Don Brown BJS:[id number] DOB: Apr 26, 1955 DOA: 02/05/2014 PCP: PROVIDER NOT IN SYSTEM  Assessment/Plan: Acute on Chronic Combined CHF. -Per ECHO earlier this year EF 25%. -Has been noncompliant with medications and follow up appointments. -Is 2.8L negative since admission. -Is still clinically volume overloaded. -Continue lasix 40 mg IV BID.  Morbid Obesity  Chronic A Fib -Rate controlled. -Per patient he was told he should not be on anticoagulation? -Certainly meets criteria as per CHADSVasc score.  Chest Pain -Ruled out for ACS. -CT Chest negative for PE or dissection.  DM 2 -Fair control. -Continue current management.    Code Status: Full Code Family Communication: Daughter at bedside  Disposition Plan: To be determined. Will need CHF education.   Consultants:  None   Antibiotics:  None   Subjective: Fatigued. No further CP.  Objective: Filed Vitals:   02/05/14 2030 02/05/14 2113 02/05/14 2231 02/06/14 0500  BP:  141/87 163/84 166/86  Pulse: 81 78 78 100  Temp:   97.7 F (36.5 C) 98.1 F (36.7 C)  TempSrc:   Oral Oral  Resp: 18 14 16 20   Height:   6' (1.829 m)   Weight:   144.607 kg (318 lb 12.8 oz) 144.289 kg (318 lb 1.6 oz)  SpO2: 91% 93% 94% 93%    Intake/Output Summary (Last 24 hours) at 02/06/14 1359 Last data filed at 02/06/14 0946  Gross per 24 hour  Intake    243 ml  Output   3100 ml  Net  -2857 ml   Filed Weights   02/05/14 2231 02/06/14 0500  Weight: 144.607 kg (318 lb 12.8 oz) 144.289 kg (318 lb 1.6 oz)    Exam:   General:  AA Ox3  Cardiovascular: RRR  Respiratory: mild bibasilar crackles  Abdomen: obese/S/NT/ND/+BS  Extremities: 1-2+ edema bilaterally   Neurologic:  Non-focal  Data Reviewed: Basic Metabolic Panel:  Recent Labs Lab 02/05/14 1807  NA 145  K 4.3  CL 107  CO2 25  GLUCOSE 111*  BUN 17  CREATININE 0.68  CALCIUM 8.8   Liver Function Tests: No  results for input(s): AST, ALT, ALKPHOS, BILITOT, PROT, ALBUMIN in the last 168 hours.  Recent Labs Lab 02/05/14 1807  LIPASE 9*   No results for input(s): AMMONIA in the last 168 hours. CBC:  Recent Labs Lab 02/05/14 1807  WBC 4.3  HGB 12.6*  HCT 41.4  MCV 86.1  PLT 156   Cardiac Enzymes:  Recent Labs Lab 02/05/14 2356 02/06/14 1117  TROPONINI <0.30 <0.30   BNP (last 3 results)  Recent Labs  07/07/13 1205 07/08/13 0447 02/05/14 1807  PROBNP 5002.0* 3045.0* 3632.0*   CBG:  Recent Labs Lab 02/05/14 1810 02/06/14 0037 02/06/14 0734 02/06/14 1208  GLUCAP 101* 123* 136* 155*    No results found for this or any previous visit (from the past 240 hour(s)).   Studies: Dg Chest 2 View  02/05/2014   CLINICAL DATA:  Shortness of breath, RIGHT thoracic pain for 3 days worsened with palpation and movement, RIGHT lower quadrant pain, history atrial fibrillation, chronic systolic heart failure, obesity, renal failure, benign essential hypertension, type 2 diabetes  EXAM: CHEST  2 VIEW  COMPARISON:  10/03/2013  FINDINGS: Enlargement of cardiac silhouette with pulmonary vascular congestion.  Despite lighter technique, BILATERAL perihilar infiltrates present likely edema.  Chronic elevation of RIGHT diaphragm.  No gross pleural effusion or pneumothorax.  IMPRESSION: Enlargement of cardiac silhouette with pulmonary vascular congestion and probable perihilar  edema.   Electronically Signed   By: Lavonia Dana M.D.   On: 02/05/2014 21:17   Ct Angio Chest Aortic Dissect W &/or W/o  02/06/2014   CLINICAL DATA:  Chest pain and shortness of Breath  EXAM: CT ANGIOGRAPHY CHEST WITH CONTRAST  TECHNIQUE: Multidetector CT imaging of the chest was performed using the standard protocol during bolus administration of intravenous contrast. Multiplanar CT image reconstructions and MIPs were obtained to evaluate the vascular anatomy.  CONTRAST:  100 mL Omnipaque 300  COMPARISON:  Chest x-ray from  earlier in the same day.  FINDINGS: The lungs are well aerated bilaterally with mild bibasilar atelectatic changes. A very small left pleural effusion is noted.  The thoracic inlet is within normal limits. The thoracic aorta and its branches are unremarkable. No dissection or aneurysmal dilatation is seen. Limited evaluation of the pulmonary artery shows no gross central pulmonary embolus. No right heart strain is noted. No significant hilar or mediastinal adenopathy is seen.  The visualized upper abdomen reveals cystic change in the right kidney as well as within the liver. Degenerative changes of the thoracic spine are noted.  Review of the MIP images confirms the above findings.  IMPRESSION: No evidence of aortic dissection or pulmonary embolism.  Bibasilar atelectatic changes are seen.  Small left pleural effusion.   Electronically Signed   By: Inez Catalina M.D.   On: 02/06/2014 01:47    Scheduled Meds: . aspirin EC  325 mg Oral Daily  . digoxin  125 mcg Oral Daily  . enoxaparin (LOVENOX) injection  70 mg Subcutaneous QHS  . folic acid  1 mg Oral Daily  . furosemide  40 mg Intravenous Q12H  . guaiFENesin  600 mg Oral BID  . insulin aspart  0-9 Units Subcutaneous TID WC  . lipase/protease/amylase  24,000 Units Oral TID AC  . lisinopril  5 mg Oral Daily  . metoprolol succinate  25 mg Oral Daily  . pantoprazole  40 mg Oral BID  . sodium chloride  3 mL Intravenous Q12H  . sodium chloride  3 mL Intravenous Q12H  . thiamine  100 mg Oral Daily   Continuous Infusions:   Principal Problem:   Nonischemic cardiomyopathy Active Problems:   Chronic pancreatitis   Acute on chronic combined systolic and diastolic CHF (congestive heart failure)   Morbid obesity   Chest pain   Chronic atrial fibrillation   Diabetes mellitus type 2, controlled    Time spent: 35 minutes. Greater than 50% of this time was spent in direct contact with the patient coordinating care.    Lelon Frohlich  [facility] Pager [phone]  If 7PM-7AM, please contact night-coverage at [email or web address], password [facility] 02/06/2014, 1:59 PM  LOS: 1 day

## 2014-02-06 NOTE — Progress Notes (Signed)
Patient has been very difficult with all nursing care today.  He refuses most of his meds, even after educating the importance of them. He has been very lethargic all day and complaining of headache.  Educated patient on the importance of leaving his oxygen on, Sats dropped down to 74% on room air.  The secretary was told she was being "unprofessional" when offering to help the patient.  When responding to call lights, he refuses care once we get to the room.  Dr. Jerilee Hoh is aware and I will continue to educate and offer help as much as possible.

## 2014-02-06 NOTE — Plan of Care (Signed)
Problem: Phase I Progression Outcomes Goal: Voiding-avoid urinary catheter unless indicated Outcome: Completed/Met Date Met:  02/06/14 Goal: Other Phase I Outcomes/Goals Outcome: Not Applicable Date Met:  48/88/91

## 2014-02-06 NOTE — Progress Notes (Signed)
Patient asked for the 10 units of Lantus that he takes at night. RN texted the PCP. The Lantus is not on the home medication list. The attending will discuss the medication Lantus with the patient tomorrow.

## 2014-02-07 DIAGNOSIS — I429 Cardiomyopathy, unspecified: Secondary | ICD-10-CM

## 2014-02-07 LAB — CBC
HCT: 43.8 % (ref 39.0–52.0)
Hemoglobin: 13.2 g/dL (ref 13.0–17.0)
MCH: 26.6 pg (ref 26.0–34.0)
MCHC: 30.1 g/dL (ref 30.0–36.0)
MCV: 88.3 fL (ref 78.0–100.0)
PLATELETS: 153 10*3/uL (ref 150–400)
RBC: 4.96 MIL/uL (ref 4.22–5.81)
RDW: 16.3 % — ABNORMAL HIGH (ref 11.5–15.5)
WBC: 3.9 10*3/uL — AB (ref 4.0–10.5)

## 2014-02-07 LAB — BASIC METABOLIC PANEL
Anion gap: 12 (ref 5–15)
BUN: 20 mg/dL (ref 6–23)
CO2: 33 mEq/L — ABNORMAL HIGH (ref 19–32)
Calcium: 8.7 mg/dL (ref 8.4–10.5)
Chloride: 101 mEq/L (ref 96–112)
Creatinine, Ser: 1.02 mg/dL (ref 0.50–1.35)
GFR calc Af Amer: >90 mL/min (ref >90)
GFR calc non Af Amer: 79 mL/min — ABNORMAL LOW (ref >90)
GLUCOSE: 114 mg/dL — AB (ref 70–99)
Potassium: 4 mEq/L (ref 3.7–5.3)
SODIUM: 146 meq/L (ref 137–147)

## 2014-02-07 LAB — GLUCOSE, CAPILLARY
Glucose-Capillary: 135 mg/dL — ABNORMAL HIGH (ref 70–99)
Glucose-Capillary: 164 mg/dL — ABNORMAL HIGH (ref 70–99)
Glucose-Capillary: 112 mg/dL — ABNORMAL HIGH (ref 70–99)

## 2014-02-07 LAB — PRO B NATRIURETIC PEPTIDE: Pro B Natriuretic peptide (BNP): 5583 pg/mL — ABNORMAL HIGH (ref 0–125)

## 2014-02-07 MED ORDER — METOCLOPRAMIDE HCL 5 MG/ML IJ SOLN: 10.0000 mg | Freq: Once | INTRAMUSCULAR | Status: DC

## 2014-02-07 MED ORDER — DIPHENHYDRAMINE HCL 50 MG/ML IJ SOLN
25.0000 mg | Freq: Once | INTRAMUSCULAR | Status: AC
  Administered 2014-02-07: 25 mg via INTRAVENOUS
  Filled 2014-02-07: qty 1

## 2014-02-07 MED ORDER — FUROSEMIDE 40 MG PO TABS
40.0000 mg | ORAL_TABLET | Freq: Two times a day (BID) | ORAL | Status: DC
  Administered 2014-02-08: 40 mg via ORAL
  Filled 2014-02-07: qty 1

## 2014-02-07 MED ORDER — ONDANSETRON 8 MG/NS 50 ML IVPB
8.0000 mg | Freq: Four times a day (QID) | INTRAVENOUS | Status: DC | PRN
  Administered 2014-02-07 (×2): 8 mg via INTRAVENOUS
  Filled 2014-02-07 (×4): qty 8

## 2014-02-07 MED ORDER — OXYCODONE HCL 5 MG PO TABS
10.0000 mg | ORAL_TABLET | ORAL | Status: AC | PRN
  Administered 2014-02-08 (×3): 10 mg via ORAL
  Filled 2014-02-07 (×3): qty 2

## 2014-02-07 MED ORDER — METOCLOPRAMIDE HCL 5 MG/ML IJ SOLN
10.0000 mg | Freq: Once | INTRAMUSCULAR | Status: AC
  Administered 2014-02-07: 10 mg via INTRAVENOUS
  Filled 2014-02-07: qty 2

## 2014-02-07 MED ORDER — ONDANSETRON HCL 4 MG PO TABS
8.0000 mg | ORAL_TABLET | Freq: Four times a day (QID) | ORAL | Status: DC | PRN
  Filled 2014-02-07 (×2): qty 2

## 2014-02-07 MED ORDER — KETOROLAC TROMETHAMINE 30 MG/ML IJ SOLN
30.0000 mg | Freq: Once | INTRAMUSCULAR | Status: AC
  Administered 2014-02-07: 30 mg via INTRAVENOUS
  Filled 2014-02-07: qty 1

## 2014-02-07 NOTE — Plan of Care (Signed)
Problem: Phase II Progression Outcomes Goal: Hemodynamically stable Outcome: Completed/Met Date Met:  02/07/14     

## 2014-02-07 NOTE — Progress Notes (Signed)
[facility] PROGRESS NOTE  Que Meneely NTZ:[id number] DOB: [birth year] DOA: 02/05/2014 PCP: PROVIDER NOT IN SYSTEM  Assessment/Plan: #1 Acute on chronic combined CHF exac/Nonischemic cardiomyopathy Likely secondary to medical noncompliance. Cardiac enzymes negative 2. 2 d echo from 05/01/13 with mild LVH and EF 20-25% with diffuse hypokinesis. Check FLP. Clinical improvement. I/O = -1677/24hr. Wt at 142.2kg. Will change IV Lasix to oral Lasix.continue aspirin, digoxin, lisinopril, metoprolol.  #2 chronic atrial fibrillation Currently rate controlled on metoprolol. Per admission H&P patient was told not to be on anticoagulation secondary to procedures in the past. Continue aspirin. Outpatient follow-up.  #3 morbid obesity  #4 chest pain May have been likely secondary to problem #1. Cardiac enzymes negative 3. CT of the chest negative for PE or acute aortic dissection. Clinical improvement. Follow.  #5 type 2 diabetes Hemoglobin A1c from 10/03/2013 was 6.7.CBGs have ranged from 123-155. Continue sliding scale insulin.  #6 chronic pancreatitis Stable. Continue Creon.  #7 prophylaxis PPI for GI prophylaxis. Lovenox for DVT prophylaxis.  Code Status: Full Family Communication: updated patient no family present. Disposition Plan: Remain inpatient.   Consultants:  None  Procedures:  CT angio chest 02/06/14  CXR 02/05/14  Antibiotics:  None  HPI/Subjective: Patient states unable to states his breathing status at this time, and not feeling too well.  Objective: Filed Vitals:   02/07/14 0644  BP: 163/83  Pulse: 74  Temp: 98 F (36.7 C)  Resp: 24    Intake/Output Summary (Last 24 hours) at 02/07/14 0953 Last data filed at 02/07/14 6759  Gross per 24 hour  Intake    720 ml  Output   1050 ml  Net   -330 ml   Filed Weights   02/05/14 2231 02/06/14 0500 02/07/14 0644  Weight: 144.607 kg (318 lb 12.8 oz) 144.289 kg (318 lb 1.6 oz) 142.203 kg (313 lb 8 oz)     Exam:   General:  NAD  Cardiovascular: Irregularly irregular  Respiratory: Bibasilar crackles  Abdomen: Soft/NT/ND/+BS  Musculoskeletal: No c/c/. Trace BLE edema  Data Reviewed: Basic Metabolic Panel:  Recent Labs Lab 02/05/14 1807 02/07/14 0520  NA 145 146  K 4.3 4.0  CL 107 101  CO2 25 33*  GLUCOSE 111* 114*  BUN 17 20  CREATININE 0.68 1.02  CALCIUM 8.8 8.7   Liver Function Tests: No results for input(s): AST, ALT, ALKPHOS, BILITOT, PROT, ALBUMIN in the last 168 hours.  Recent Labs Lab 02/05/14 1807  LIPASE 9*   No results for input(s): AMMONIA in the last 168 hours. CBC:  Recent Labs Lab 02/05/14 1807 02/07/14 0520  WBC 4.3 3.9*  HGB 12.6* 13.2  HCT 41.4 43.8  MCV 86.1 88.3  PLT 156 153   Cardiac Enzymes:  Recent Labs Lab 02/05/14 2356 02/06/14 1117  TROPONINI <0.30 <0.30   BNP (last 3 results)  Recent Labs  07/08/13 0447 02/05/14 1807 02/07/14 0535  PROBNP 3045.0* 3632.0* 5583.0*   CBG:  Recent Labs Lab 02/06/14 0037 02/06/14 0734 02/06/14 1208 02/06/14 2203 02/07/14 0718  GLUCAP 123* 136* 155* 123* 135*    No results found for this or any previous visit (from the past 240 hour(s)).   Studies: Dg Chest 2 View  02/05/2014   CLINICAL DATA:  Shortness of breath, RIGHT thoracic pain for 3 days worsened with palpation and movement, RIGHT lower quadrant pain, history atrial fibrillation, chronic systolic heart failure, obesity, renal failure, benign essential hypertension, type 2 diabetes  EXAM: CHEST  2 VIEW  COMPARISON:  10/03/2013  FINDINGS: Enlargement of cardiac silhouette with pulmonary vascular congestion.  Despite lighter technique, BILATERAL perihilar infiltrates present likely edema.  Chronic elevation of RIGHT diaphragm.  No gross pleural effusion or pneumothorax.  IMPRESSION: Enlargement of cardiac silhouette with pulmonary vascular congestion and probable perihilar edema.   Electronically Signed   By: Lavonia Dana  M.D.   On: 02/05/2014 21:17   Ct Angio Chest Aortic Dissect W &/or W/o  02/06/2014   CLINICAL DATA:  Chest pain and shortness of Breath  EXAM: CT ANGIOGRAPHY CHEST WITH CONTRAST  TECHNIQUE: Multidetector CT imaging of the chest was performed using the standard protocol during bolus administration of intravenous contrast. Multiplanar CT image reconstructions and MIPs were obtained to evaluate the vascular anatomy.  CONTRAST:  100 mL Omnipaque 300  COMPARISON:  Chest x-ray from earlier in the same day.  FINDINGS: The lungs are well aerated bilaterally with mild bibasilar atelectatic changes. A very small left pleural effusion is noted.  The thoracic inlet is within normal limits. The thoracic aorta and its branches are unremarkable. No dissection or aneurysmal dilatation is seen. Limited evaluation of the pulmonary artery shows no gross central pulmonary embolus. No right heart strain is noted. No significant hilar or mediastinal adenopathy is seen.  The visualized upper abdomen reveals cystic change in the right kidney as well as within the liver. Degenerative changes of the thoracic spine are noted.  Review of the MIP images confirms the above findings.  IMPRESSION: No evidence of aortic dissection or pulmonary embolism.  Bibasilar atelectatic changes are seen.  Small left pleural effusion.   Electronically Signed   By: Inez Catalina M.D.   On: 02/06/2014 01:47    Scheduled Meds: . aspirin EC  325 mg Oral Daily  . digoxin  125 mcg Oral Daily  . enoxaparin (LOVENOX) injection  70 mg Subcutaneous QHS  . folic acid  1 mg Oral Daily  . furosemide  40 mg Intravenous Q12H  . guaiFENesin  600 mg Oral BID  . insulin aspart  0-9 Units Subcutaneous TID WC  . lipase/protease/amylase  24,000 Units Oral TID AC  . lisinopril  5 mg Oral Daily  . metoprolol succinate  25 mg Oral Daily  . pantoprazole  40 mg Oral BID  . sodium chloride  3 mL Intravenous Q12H  . sodium chloride  3 mL Intravenous Q12H  . thiamine   100 mg Oral Daily   Continuous Infusions:   Principal Problem:   Acute on chronic combined systolic and diastolic CHF (congestive heart failure) Active Problems:   Nonischemic cardiomyopathy   Chronic pancreatitis   Morbid obesity   Chest pain   Chronic atrial fibrillation   Diabetes mellitus type 2, controlled    Time spent: [facility] Pager [phone]. If 7PM-7AM, please contact night-coverage at [email or web address], password [facility] 02/07/2014, 9:53 AM  LOS: 2 days

## 2014-02-07 NOTE — Plan of Care (Signed)
Problem: Phase I Progression Outcomes Goal: Aspirin unless contraindicated Outcome: Completed/Met Date Met:  02/07/14

## 2014-02-07 NOTE — Progress Notes (Signed)
The PCP on call asked RN to give Lisinopril and Toprol XL early for Sysytolic B/P 770.

## 2014-02-07 NOTE — Progress Notes (Signed)
Patient refused all night time medications. Pt is drowsy, but arousable. Will continue to monitor patient closely.

## 2014-02-07 NOTE — Progress Notes (Signed)
Patient had a systolic B/P of 622. PCP on call was paged. Will continue to monitor the patient.

## 2014-02-07 NOTE — Progress Notes (Signed)
Pt c/o headache which he states has not subsided with Dilaudid.  Pt also has nausea/vomiting.  Refuses to take phenergan stating it increases his vomiting.  Dr. Grandville Silos notified of these events and order was received to discontinue dilaudid and to give Reglan IV prior to po pain meds.  RN attempted to explain the new orders to the patient and patient proceed to become angry and began to curse RN stating, "you are an f____ idiot, you continue to give me a blank look and you have no idea what you are doing" .  RN left room and spoke with Dr. Grandville Silos once more. At this time Dr. Grandville Silos accompanied RN and CN to room to explain the rationale to for adjusting medications to pt. Pt became increasingly agitated and once more called this RN an "idiot". This RN left room and Dr.Thompson and CN continued to converse with pt. Stacey Drain

## 2014-02-07 NOTE — Plan of Care (Signed)
Problem: Phase I Progression Outcomes Goal: Anginal pain relieved Outcome: Completed/Met Date Met:  02/07/14 No complaints of anginal pain

## 2014-02-08 ENCOUNTER — Inpatient Hospital Stay (HOSPITAL_COMMUNITY): Payer: Non-veteran care

## 2014-02-08 DIAGNOSIS — K861 Other chronic pancreatitis: Secondary | ICD-10-CM

## 2014-02-08 DIAGNOSIS — I5043 Acute on chronic combined systolic (congestive) and diastolic (congestive) heart failure: Principal | ICD-10-CM

## 2014-02-08 DIAGNOSIS — I10 Essential (primary) hypertension: Secondary | ICD-10-CM

## 2014-02-08 LAB — GLUCOSE, CAPILLARY
GLUCOSE-CAPILLARY: 126 mg/dL — AB (ref 70–99)
GLUCOSE-CAPILLARY: 177 mg/dL — AB (ref 70–99)
Glucose-Capillary: 107 mg/dL — ABNORMAL HIGH (ref 70–99)
Glucose-Capillary: 157 mg/dL — ABNORMAL HIGH (ref 70–99)

## 2014-02-08 LAB — CBC
HEMATOCRIT: 42.7 % (ref 39.0–52.0)
HEMOGLOBIN: 12.6 g/dL — AB (ref 13.0–17.0)
MCH: 26 pg (ref 26.0–34.0)
MCHC: 29.5 g/dL — AB (ref 30.0–36.0)
MCV: 88 fL (ref 78.0–100.0)
Platelets: 166 10*3/uL (ref 150–400)
RBC: 4.85 MIL/uL (ref 4.22–5.81)
RDW: 16 % — AB (ref 11.5–15.5)
WBC: 4.9 10*3/uL (ref 4.0–10.5)

## 2014-02-08 LAB — BASIC METABOLIC PANEL
Anion gap: 10 (ref 5–15)
BUN: 20 mg/dL (ref 6–23)
CALCIUM: 8.7 mg/dL (ref 8.4–10.5)
CO2: 33 mEq/L — ABNORMAL HIGH (ref 19–32)
CREATININE: 0.85 mg/dL (ref 0.50–1.35)
Chloride: 98 mEq/L (ref 96–112)
GFR calc Af Amer: >90 mL/min (ref >90)
GFR calc non Af Amer: >90 mL/min (ref >90)
Glucose, Bld: 128 mg/dL — ABNORMAL HIGH (ref 70–99)
Potassium: 4.2 mEq/L (ref 3.7–5.3)
Sodium: 141 mEq/L (ref 137–147)

## 2014-02-08 MED ORDER — HYDROMORPHONE HCL 1 MG/ML IJ SOLN
1.0000 mg | INTRAMUSCULAR | Status: DC | PRN
  Administered 2014-02-08 – 2014-02-09 (×5): 1 mg via INTRAVENOUS
  Filled 2014-02-08 (×5): qty 1

## 2014-02-08 MED ORDER — METOCLOPRAMIDE HCL 5 MG/ML IJ SOLN
10.0000 mg | Freq: Once | INTRAMUSCULAR | Status: AC
  Administered 2014-02-08: 10 mg via INTRAVENOUS
  Filled 2014-02-08: qty 2

## 2014-02-08 MED ORDER — LISINOPRIL 10 MG PO TABS
10.0000 mg | ORAL_TABLET | Freq: Every day | ORAL | Status: DC
  Administered 2014-02-08 – 2014-02-12 (×5): 10 mg via ORAL
  Filled 2014-02-08 (×5): qty 1

## 2014-02-08 MED ORDER — FUROSEMIDE 10 MG/ML IJ SOLN
80.0000 mg | Freq: Two times a day (BID) | INTRAMUSCULAR | Status: AC
  Administered 2014-02-08 – 2014-02-11 (×6): 80 mg via INTRAVENOUS
  Filled 2014-02-08 (×6): qty 8

## 2014-02-08 NOTE — Progress Notes (Signed)
[facility] PROGRESS NOTE  Don Brown NWG:[id number] DOB: 06-02-1955 DOA: 02/05/2014 PCP: PROVIDER NOT IN SYSTEM  Assessment/Plan: #1 Acute on chronic combined CHF exac/Nonischemic cardiomyopathy Likely secondary to medical noncompliance. Cardiac enzymes negative 2. 2 d echo from 05/01/13 with mild LVH and EF 20-25% with diffuse hypokinesis. Patient still volume overloaded.Repeat CXR. Clinical improvement. I/O = -690/24hr. Wt at 142.1kg. Will change oral Lasix to IV Lasix 80 mg q12..Continue aspirin, digoxin, lisinopril, metoprolol.  #2 chronic atrial fibrillation Currently rate controlled on metoprolol. Per admission H&P patient was told not to be on anticoagulation secondary to procedures in the past. Continue aspirin. Outpatient follow-up.  #3 morbid obesity  #4 chest pain May have been likely secondary to problem #1. Cardiac enzymes negative 3. CT of the chest negative for PE or acute aortic dissection. Clinical improvement. Follow.  #5 type 2 diabetes Hemoglobin A1c from 10/03/2013 was 6.7.CBGs have ranged from 107-157. Continue sliding scale insulin.  #6 chronic pancreatitis Stable. Continue Creon.  #7 hypertension Continue metoprolol. Increase lisinopril to 10 mg daily.  #8 prophylaxis PPI for GI prophylaxis. Lovenox for DVT prophylaxis.  Code Status: Full Family Communication: updated patient and daughter at bedside. Disposition Plan: Remain inpatient.   Consultants:  None  Procedures:  CT angio chest 02/06/14  CXR 02/05/14  Antibiotics:  None  HPI/Subjective: Patient c/o headache and focused on pain.No CP. Patient c/o nausea.  Objective: Filed Vitals:   02/08/14 0540  BP: 162/94  Pulse: 79  Temp: 98 F (36.7 C)  Resp: 16    Intake/Output Summary (Last 24 hours) at 02/08/14 1012 Last data filed at 02/08/14 0911  Gross per 24 hour  Intake    590 ml  Output    600 ml  Net    -10 ml   Filed Weights   02/06/14 0500 02/07/14 0644  02/08/14 0540  Weight: 144.289 kg (318 lb 1.6 oz) 142.203 kg (313 lb 8 oz) 141.931 kg (312 lb 14.4 oz)    Exam:   General:  NAD  Cardiovascular: Irregularly irregular. +JVD  Respiratory: Bibasilar crackles  Abdomen: Soft/NT/ND/+BS  Musculoskeletal: No c/c/. Trace BLE edema  Data Reviewed: Basic Metabolic Panel:  Recent Labs Lab 02/05/14 1807 02/07/14 0520 02/08/14 0625  NA 145 146 141  K 4.3 4.0 4.2  CL 107 101 98  CO2 25 33* 33*  GLUCOSE 111* 114* 128*  BUN 17 20 20   CREATININE 0.68 1.02 0.85  CALCIUM 8.8 8.7 8.7   Liver Function Tests: No results for input(s): AST, ALT, ALKPHOS, BILITOT, PROT, ALBUMIN in the last 168 hours.  Recent Labs Lab 02/05/14 1807  LIPASE 9*   No results for input(s): AMMONIA in the last 168 hours. CBC:  Recent Labs Lab 02/05/14 1807 02/07/14 0520 02/08/14 0625  WBC 4.3 3.9* 4.9  HGB 12.6* 13.2 12.6*  HCT 41.4 43.8 42.7  MCV 86.1 88.3 88.0  PLT 156 153 166   Cardiac Enzymes:  Recent Labs Lab 02/05/14 2356 02/06/14 1117  TROPONINI <0.30 <0.30   BNP (last 3 results)  Recent Labs  07/08/13 0447 02/05/14 1807 02/07/14 0535  PROBNP 3045.0* 3632.0* 5583.0*   CBG:  Recent Labs Lab 02/06/14 2203 02/07/14 0718 02/07/14 1237 02/07/14 2221 02/08/14 0800  GLUCAP 123* 135* 164* 112* 107*    No results found for this or any previous visit (from the past 240 hour(s)).   Studies: No results found.  Scheduled Meds: . aspirin EC  325 mg Oral Daily  . digoxin  125 mcg Oral  Daily  . enoxaparin (LOVENOX) injection  70 mg Subcutaneous QHS  . folic acid  1 mg Oral Daily  . furosemide  40 mg Oral BID  . guaiFENesin  600 mg Oral BID  . insulin aspart  0-9 Units Subcutaneous TID WC  . lipase/protease/amylase  24,000 Units Oral TID AC  . lisinopril  10 mg Oral Daily  . metoCLOPramide (REGLAN) injection  10 mg Intravenous Once  . metoprolol succinate  25 mg Oral Daily  . pantoprazole  40 mg Oral BID  . sodium  chloride  3 mL Intravenous Q12H  . sodium chloride  3 mL Intravenous Q12H  . thiamine  100 mg Oral Daily   Continuous Infusions:   Principal Problem:   Acute on chronic combined systolic and diastolic CHF (congestive heart failure) Active Problems:   Nonischemic cardiomyopathy   Chronic pancreatitis   Morbid obesity   Chest pain   Chronic atrial fibrillation   Diabetes mellitus type 2, controlled    Time spent: [facility] Pager [phone]. If 7PM-7AM, please contact night-coverage at [email or web address], password [facility] 02/08/2014, 10:12 AM  LOS: 3 days

## 2014-02-08 NOTE — Progress Notes (Signed)
BP noted as high. Pt refusing all meds other than pain medicines and lasix.

## 2014-02-08 NOTE — Plan of Care (Signed)
Problem: Phase II Progression Outcomes Goal: Anginal pain relieved Outcome: Completed/Met Date Met:  02/08/14 Goal: Other Phase II Outcomes/Goals Outcome: Completed/Met Date Met:  02/08/14  Problem: Phase III Progression Outcomes Goal: Tolerating diet Outcome: Completed/Met Date Met:  02/08/14

## 2014-02-08 NOTE — Progress Notes (Signed)
Pt refusing to get up in chair or turn to another side to prevent skin breakdown.  Educated on importance of sitting up and moving.

## 2014-02-08 NOTE — Progress Notes (Signed)
Patient non-compliant with nasal cannula. Patient oxygen desats into 70's while on RA. Patient made aware of this and is still non-compliant with oxygen. Patient stated the oxygen is giving him a headache. I offered the patient Tylenol, but he refused and stated "I would rather have Advil." Pt currently with nasal cannula on at 2 Liters. Will pass this information on to the day shift nurse.

## 2014-02-08 NOTE — Plan of Care (Signed)
Problem: Phase I Progression Outcomes Goal: MD aware of Cardiac Marker results Outcome: Completed/Met Date Met:  02/08/14  Problem: Phase II Progression Outcomes Goal: Stress Test if indicated Outcome: Progressing Goal: Cath/PCI Day Path if indicated Outcome: Progressing Goal: CV Risk Factors identified Outcome: Progressing Goal: Cardiac Rehab if ordered Outcome: Not Applicable Date Met:  59/93/57 Goal: If positive for MI, change to MI Path Outcome: Not Applicable Date Met:  01/77/93  Problem: Phase III Progression Outcomes Goal: Hemodynamically stable Outcome: Not Applicable Date Met:  90/30/09

## 2014-02-08 NOTE — Progress Notes (Addendum)
Pt refused 2200 meds. Pt decided at 0312  that he wanted his medication except the Lovenox. Contacted pharmacy and was given the ok to administer meds at this time. Will continue to monitor closely.

## 2014-02-08 NOTE — Progress Notes (Signed)
Pt refusing to keep O2 on.  States it gives him a headache.  Educated pt. On importance of wearing o2 at all times and advised him to make sure he put it on if he felt short of breath at all.  Will continue to monitor closely.

## 2014-02-09 DIAGNOSIS — I5023 Acute on chronic systolic (congestive) heart failure: Secondary | ICD-10-CM

## 2014-02-09 LAB — GLUCOSE, CAPILLARY
GLUCOSE-CAPILLARY: 143 mg/dL — AB (ref 70–99)
GLUCOSE-CAPILLARY: 169 mg/dL — AB (ref 70–99)
GLUCOSE-CAPILLARY: 172 mg/dL — AB (ref 70–99)
Glucose-Capillary: 129 mg/dL — ABNORMAL HIGH (ref 70–99)

## 2014-02-09 LAB — BASIC METABOLIC PANEL
Anion gap: 10 (ref 5–15)
BUN: 20 mg/dL (ref 6–23)
CO2: 37 meq/L — AB (ref 19–32)
Calcium: 9 mg/dL (ref 8.4–10.5)
Chloride: 94 mEq/L — ABNORMAL LOW (ref 96–112)
Creatinine, Ser: 0.92 mg/dL (ref 0.50–1.35)
GFR calc Af Amer: >90 mL/min (ref >90)
GFR calc non Af Amer: >90 mL/min (ref >90)
GLUCOSE: 220 mg/dL — AB (ref 70–99)
Potassium: 3.5 mEq/L — ABNORMAL LOW (ref 3.7–5.3)
Sodium: 141 mEq/L (ref 137–147)

## 2014-02-09 LAB — CBC
HEMATOCRIT: 44.6 % (ref 39.0–52.0)
Hemoglobin: 13.4 g/dL (ref 13.0–17.0)
MCH: 26.8 pg (ref 26.0–34.0)
MCHC: 30 g/dL (ref 30.0–36.0)
MCV: 89.2 fL (ref 78.0–100.0)
Platelets: 159 10*3/uL (ref 150–400)
RBC: 5 MIL/uL (ref 4.22–5.81)
RDW: 16 % — ABNORMAL HIGH (ref 11.5–15.5)
WBC: 5 10*3/uL (ref 4.0–10.5)

## 2014-02-09 LAB — PRO B NATRIURETIC PEPTIDE: Pro B Natriuretic peptide (BNP): 1861 pg/mL — ABNORMAL HIGH (ref 0–125)

## 2014-02-09 LAB — MAGNESIUM: Magnesium: 1.9 mg/dL (ref 1.5–2.5)

## 2014-02-09 MED ORDER — ACETAZOLAMIDE 250 MG PO TABS
250.0000 mg | ORAL_TABLET | Freq: Two times a day (BID) | ORAL | Status: AC
  Administered 2014-02-09 – 2014-02-10 (×3): 250 mg via ORAL
  Filled 2014-02-09 (×4): qty 1

## 2014-02-09 MED ORDER — POTASSIUM CHLORIDE CRYS ER 20 MEQ PO TBCR: 40.0000 meq | EXTENDED_RELEASE_TABLET | Freq: Every day | ORAL | Status: DC

## 2014-02-09 MED ORDER — POTASSIUM CHLORIDE CRYS ER 20 MEQ PO TBCR
40.0000 meq | EXTENDED_RELEASE_TABLET | Freq: Every day | ORAL | Status: DC
  Administered 2014-02-10 – 2014-02-12 (×3): 40 meq via ORAL
  Filled 2014-02-09 (×4): qty 2

## 2014-02-09 MED ORDER — LISINOPRIL 10 MG PO TABS: 10.0000 mg | ORAL_TABLET | Freq: Every day | ORAL | Status: DC

## 2014-02-09 MED ORDER — HYDROMORPHONE HCL 2 MG PO TABS
1.0000 mg | ORAL_TABLET | ORAL | Status: DC | PRN
  Administered 2014-02-09 – 2014-02-12 (×12): 1 mg via ORAL
  Filled 2014-02-09 (×13): qty 1

## 2014-02-09 MED ORDER — FUROSEMIDE 10 MG/ML IJ SOLN: 80.0000 mg | Freq: Two times a day (BID) | INTRAMUSCULAR | Status: DC

## 2014-02-09 MED ORDER — OXYCODONE HCL 5 MG PO TABS
5.0000 mg | ORAL_TABLET | Freq: Once | ORAL | Status: AC
  Administered 2014-02-10: 5 mg via ORAL
  Filled 2014-02-09: qty 1

## 2014-02-09 NOTE — Progress Notes (Signed)
Patient is asking for Lantus 10 units. PCP on call was paged. Medication is not on home medication list. Will pass this request onto first shift staff.

## 2014-02-09 NOTE — Progress Notes (Signed)
Pt refused to take diamox which was a new med added this am by MD. Pt also refused to take potassium tablets. RN educated pt on importance of both meds and encouraged pt to take them, but he refused. Pt sleeping all morning and into the afternoon. Will continue to monitor.

## 2014-02-09 NOTE — Progress Notes (Signed)
Pt refused blood draw this AM. Pt ripped tourniquet off arm when lab attempted to stick him. Educated patient. Pt still refused. Will continue to monitor.

## 2014-02-09 NOTE — Progress Notes (Signed)
OT Cancellation Note  Patient Details Name: Don Brown MRN: [id number] DOB: 08-Dec-1955   Cancelled Treatment:    Reason Eval/Treat Not Completed: Other (comment) Pt refused OOB at this time. He states, "I will move when I am ready." Will try another time.  Jules Schick  [phone] 02/09/2014, 1:14 PM

## 2014-02-09 NOTE — Progress Notes (Signed)
Patient is asking for Oxycodone IR 10 mg tabs. The PCP was notified. Awaiting any new orders.

## 2014-02-09 NOTE — Discharge Summary (Signed)
Physician Discharge Summary  Don Brown XLK:[id number] DOB: 1955-08-16 DOA: 02/05/2014  PCP: PROVIDER NOT IN SYSTEM  Admit date: 02/05/2014 Discharge date: 02/09/2014  Time spent: 65 minutes  Recommendations for Outpatient Follow-up:   1. Patient to be transferred to the [facility] for further medical management of his acute on chronic combined systolic and diastolic CHF/nonischemic cardiomyopathy. Patient will likely benefit from early posthospital CHF follow-up and medication compliance would need to be stressed. Patient may need further up titration of his medications for better blood pressure control.  Discharge Diagnoses:  Principal Problem:   Acute on chronic combined systolic and diastolic CHF (congestive heart failure) Active Problems:   Nonischemic cardiomyopathy   Chronic pancreatitis   Morbid obesity   Chest pain   Chronic atrial fibrillation   Diabetes mellitus type 2, controlled   HTN (hypertension), benign   Discharge Condition: Stable  Diet recommendation: Heart healthy  Filed Weights   02/07/14 0644 02/08/14 0540 02/09/14 0624  Weight: 142.203 kg (313 lb 8 oz) 141.931 kg (312 lb 14.4 oz) 137.893 kg (304 lb)    History of present illness:  Don Brown is a 58 y.o. 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 Patient stated his symptoms started on Thursday 4 days prior to admission with chest pain and gradually patient started developing shortness of breath. Patient did go to the ER at [facility] on Thursday. Patient stated over the weekend patient became very short of breath and had come to the ER today. Patient stated he had been taking his Lasix as advised. Patient's chest pain was retrosternal radiating to his back.  Pain improved with morphine. Had occasional nausea, denied any vomiting or abdominal pain, denied any fever chills productive cough or any diarrhea. Chest x-ray in the ER showed congestion compatible with CHF. Patient was given Lasix 60 mg IV and admitted for further management.   Hospital Course:  #1 Acute on chronic combined CHF exac/Nonischemic cardiomyopathy Patient had presented with chest pain or shortness of breath which started 4 days prior to admission. Patient did state that he been compliant with his medications. Patient had a chest x-ray done on admission which was consistent with CHF. Pro BNP which was done was elevated at 3632 on admission. Patient was admitted to telemetry. It was felt that patient exacerbation may have been secondary to medical noncompliance. Cardiac enzymes were cycled which were negative 2. 2 d echo from 05/01/13 with mild LVH and EF 20-25% with diffuse hypokinesis. Patient patient was placed on Lasix 40 mg IV every 12 hours. Pro BNP was followed and initially increased to 5583. Repeat chest x-ray which was done was consistent with CHF. Patient was also noted to be volume overloaded on examination with some JVD and diffuse crackles. Patient was also noted to be hypoxic with sats of 70% on room air. CT of the chest which was done was negative for PE or aortic dissection. Strict I's and O's were done as well as daily weights and patient's weight trended down during the hospitalization. Patient's diuretics were increased to 80 mg IV every 12 hours with improvement with his urinary output however patient was still volume overloaded. Cardiology consultation was obtained and patient was seen in consultation by Dr. Ron Parker who agreed with continued use of IV  Lasix twice daily until patient was euvolemic and then transitioned to oral Lasix. Patient's potassium also needed to be followed. As patient was going to be a greater than 48 hours patient be transferred to the [place] for further  diuresis. Patient will likely benefit from early posthospital CHF once his discharge from the [place]. Patient will be transferred to the [facility] for further management of his acute on chronic combined CHF exacerbation and nonischemic cardiomyopathy. Patient will be discharged in stable condition.   #2 chronic atrial fibrillation Patient was maintained on metoprolol for rate control during the hospitalization. Per admission H&P patient was told not to be on anticoagulation secondary to procedures in the past. Patient was subsequently maintained on aspirin and will need to follow-up with his PCP as outpatient in terms of anticoagulation for his atrial fibrillation.   #3 morbid obesity  #4 chest pain May have been likely secondary to problem #1. Cardiac enzymes negative 3. CT of the chest negative for PE or acute aortic dissection. Patient improved clinically with diuresis and had no further chest pain.   #5 type 2 diabetes Hemoglobin A1c from 10/03/2013 was 6.7.CBGs have ranged from 107-157. Patient was maintained on a sliding scale insulin throughout this hospitalization.   #6 chronic pancreatitis Stable. Continued on home regimen of Creon.  #7 hypertension Continued on home dose metoprolol. Increased lisinopril to 10 mg daily. Patient blood pressure improved somewhat however this role for further titration if needed.  Procedures:  CT angio chest 02/06/14  CXR 02/05/14  Consultations:  Cardiology : Dr Ron Parker 02/09/14  Discharge Exam: Filed Vitals:   02/09/14 1255  BP: 150/78  Pulse: 74  Temp: 98.1 F (36.7 C)  Resp: 18    General: NAD Cardiovascular: Irregularly irregularly. + JVD Respiratory: Diffuse crackles  Discharge Instructions You were cared for by a hospitalist during your hospital stay. If you have any questions about your discharge medications or the care you received while you were in the hospital after you are discharged, you can call the unit and asked to speak with the  hospitalist on call if the hospitalist that took care of you is not available. Once you are discharged, your primary care physician will handle any further medical issues. Please note that NO REFILLS for any discharge medications will be authorized once you are discharged, as it is imperative that you return to your primary care physician (or establish a relationship with a primary care physician if you do not have one) for your aftercare needs so that they can reassess your need for medications and monitor your lab values.  Discharge Instructions    Diet - low sodium heart healthy    Complete by:  As directed           Current Discharge Medication List    START taking these medications   Details  furosemide (LASIX) 10 MG/ML injection Inject 8 mLs (80 mg total) into the vein every 12 (twelve) hours. Qty: 4 mL, Refills: 0    potassium chloride SA (K-DUR,KLOR-CON) 20 MEQ tablet Take 2 tablets (40 mEq total) by mouth daily. Qty: 62 tablet, Refills: 0      CONTINUE these medications which have CHANGED   Details  lisinopril (PRINIVIL,ZESTRIL) 10 MG tablet Take 1 tablet (10 mg total) by mouth daily. Qty: 30 tablet, Refills: 0      CONTINUE these medications which have NOT CHANGED   Details  acetaminophen (TYLENOL) 325 MG tablet Take 2 tablets (650 mg total) by mouth every  6 (six) hours as needed (for pain). Qty: 90 tablet, Refills: 0    Amylase-Lipase-Protease (PANCREASE PO) Take 2 capsules by mouth 3 (three) times daily with meals.     aspirin 81 MG tablet Take 81 mg by mouth daily before breakfast.     Cyanocobalamin (VITAMIN B 12 PO) Take 2 tablets by mouth daily.     digoxin (LANOXIN) 0.125 MG tablet Take 125 mcg by mouth every morning.     folic acid (FOLVITE) 1 MG tablet Take 1 tablet (1 mg total) by mouth daily. Qty: 30 tablet, Refills: 0    glucose monitoring kit (FREESTYLE) monitoring kit 1 each by Does not apply route 4 (four) times daily - after meals and at bedtime. 1  month Diabetic Testing Supplies for QAC-QHS accuchecks.Any brand OK Qty: 1 each, Refills: 1    Green Tea, Camillia sinensis, (GREEN TEA PO) Take 2 tablets by mouth daily.     insulin aspart (NOVOLOG) 100 UNIT/ML injection Before each meal 3 times a day and then bedtime, 140-199 - 2 units, 200-250 - 4 units, 251-299 - 6 units,  300-349 - 8 units,  350 or above 10 units. Dispense syringes and needles as needed, Ok to switch to PEN if approved. Okay to switch to any approved short-acting insulin. Qty: 1 vial, Refills: 12    metoprolol succinate (TOPROL-XL) 25 MG 24 hr tablet Take 1 tablet (25 mg total) by mouth every morning. Qty: 30 tablet, Refills: 0    oxyCODONE 10 MG TABS Take 1 tablet (10 mg total) by mouth every 6 (six) hours as needed for severe pain. Qty: 56 tablet, Refills: 0    pantoprazole (PROTONIX) 40 MG tablet Take 1 tablet (40 mg total) by mouth 2 (two) times daily. Qty: 60 tablet, Refills: 0    promethazine (PHENERGAN) 25 MG tablet Take 25 mg by mouth every 8 (eight) hours as needed for nausea or vomiting.    thiamine 100 MG tablet Take 1 tablet (100 mg total) by mouth daily. Qty: 30 tablet, Refills: 0    guaiFENesin (MUCINEX) 600 MG 12 hr tablet Take 1 tablet (600 mg total) by mouth 2 (two) times daily. Qty: 60 tablet, Refills: 0    ibuprofen (ADVIL,MOTRIN) 200 MG tablet Take 400 mg by mouth every 6 (six) hours as needed for headache or moderate pain.      STOP taking these medications     furosemide (LASIX) 40 MG tablet        Allergies  Allergen Reactions  . Morphine And Related Nausea And Vomiting   Follow-up Information    Follow up with PROVIDER NOT IN SYSTEM.   Why:  f/u with MD at [facility]       The results of significant diagnostics from this hospitalization (including imaging, microbiology, ancillary and laboratory) are listed below for reference.    Significant Diagnostic Studies: Dg Chest 2 View  02/05/2014   CLINICAL DATA:  Shortness of breath,  RIGHT thoracic pain for 3 days worsened with palpation and movement, RIGHT lower quadrant pain, history atrial fibrillation, chronic systolic heart failure, obesity, renal failure, benign essential hypertension, type 2 diabetes  EXAM: CHEST  2 VIEW  COMPARISON:  10/03/2013  FINDINGS: Enlargement of cardiac silhouette with pulmonary vascular congestion.  Despite lighter technique, BILATERAL perihilar infiltrates present likely edema.  Chronic elevation of RIGHT diaphragm.  No gross pleural effusion or pneumothorax.  IMPRESSION: Enlargement of cardiac silhouette with pulmonary vascular congestion and probable perihilar edema.   Electronically Signed  By: Lavonia Dana M.D.   On: 02/05/2014 21:17   Dg Chest Port 1 View  02/08/2014   CLINICAL DATA:  Heart failure, renal failure, shortness of Breath  EXAM: PORTABLE CHEST - 1 VIEW  COMPARISON:  02/05/2014  FINDINGS: Cardiomegaly again noted. Persistent mild congestion/ edema with slight improvement in aeration. No segmental infiltrate. Degenerative changes thoracic spine.  IMPRESSION: Cardiomegaly. Persistent mild congestion/ pulmonary edema with slight improvement in aeration.   Electronically Signed   By: Lahoma Crocker M.D.   On: 02/08/2014 11:07   Ct Angio Chest Aortic Dissect W &/or W/o  02/06/2014   CLINICAL DATA:  Chest pain and shortness of Breath  EXAM: CT ANGIOGRAPHY CHEST WITH CONTRAST  TECHNIQUE: Multidetector CT imaging of the chest was performed using the standard protocol during bolus administration of intravenous contrast. Multiplanar CT image reconstructions and MIPs were obtained to evaluate the vascular anatomy.  CONTRAST:  100 mL Omnipaque 300  COMPARISON:  Chest x-ray from earlier in the same day.  FINDINGS: The lungs are well aerated bilaterally with mild bibasilar atelectatic changes. A very small left pleural effusion is noted.  The thoracic inlet is within normal limits. The thoracic aorta and its branches are unremarkable. No dissection or  aneurysmal dilatation is seen. Limited evaluation of the pulmonary artery shows no gross central pulmonary embolus. No right heart strain is noted. No significant hilar or mediastinal adenopathy is seen.  The visualized upper abdomen reveals cystic change in the right kidney as well as within the liver. Degenerative changes of the thoracic spine are noted.  Review of the MIP images confirms the above findings.  IMPRESSION: No evidence of aortic dissection or pulmonary embolism.  Bibasilar atelectatic changes are seen.  Small left pleural effusion.   Electronically Signed   By: Inez Catalina M.D.   On: 02/06/2014 01:47    Microbiology: No results found for this or any previous visit (from the past 240 hour(s)).   Labs: Basic Metabolic Panel:  Recent Labs Lab 02/05/14 1807 02/07/14 0520 02/08/14 0625 02/09/14 0810  NA 145 146 141 141  K 4.3 4.0 4.2 3.5*  CL 107 101 98 94*  CO2 25 33* 33* 37*  GLUCOSE 111* 114* 128* 220*  BUN _0 CREATININE 0.68 1.02 0.85 0.92  CALCIUM 8.8 8.7 8.7 9.0  MG  --   --   --  1.9   Liver Function Tests: No results for input(s): AST, ALT, ALKPHOS, BILITOT, PROT, ALBUMIN in the last 168 hours.  Recent Labs Lab 02/05/14 1807  LIPASE 9*   No results for input(s): AMMONIA in the last 168 hours. CBC:  Recent Labs Lab 02/05/14 1807 02/07/14 0520 02/08/14 0625 02/09/14 0810  WBC 4.3 3.9* 4.9 5.0  HGB 12.6* 13.2 12.6* 13.4  HCT 41.4 43.8 42.7 44.6  MCV 86.1 88.3 88.0 89.2  PLT 156 153 166 159   Cardiac Enzymes:  Recent Labs Lab 02/05/14 2356 02/06/14 1117  TROPONINI <0.30 <0.30   BNP: BNP (last 3 results)  Recent Labs  02/05/14 1807 02/07/14 0535 02/09/14 0810  PROBNP 3632.0* 5583.0* 1861.0*   CBG:  Recent Labs Lab 02/08/14 1201 02/08/14 1627 02/08/14 2134 02/09/14 0732 02/09/14 1216  GLUCAP 157* 126* 177* 129* 143*       Signed:  THOMPSON,DANIEL MD [facility] 02/09/2014, 3:38 PM

## 2014-02-09 NOTE — Progress Notes (Signed)
PT Cancellation Note  Patient Details Name: Don Brown MRN: [id number] DOB: 24-Jan-1956   Cancelled Treatment:    Reason Eval/Treat Not Completed: Attemtped PT eval-pt declined to participate stating "I will move when I am ready/up to it." Will check back another day. Thanks   Weston Anna, MPT Pager: [phone]

## 2014-02-09 NOTE — Consult Note (Signed)
CARDIOLOGY CONSULT NOTE   Patient ID: Don Brown MRN: [id number] DOB/AGE: 1955/05/15 58 y.o.  Admit Date: 02/05/2014  Primary Physician: [facility] NOT [facility]  [facility]   [facility]  Clinical Summary Don Brown is a 57 y.o.male. He has a nonischemic cardiomyopathy with acute on chronic systolic CHF. He is diuresing in the hospital. The primary care team is asked for further input concerning his medications. I have traveled to [facility] to see the patient. Decision has been made for him to be transferred to the [facility] at their request. He is more comfortable at this time. He's not having any chest pain.   Allergies  Allergen Reactions  . Morphine And Related Nausea And Vomiting    Medications Scheduled Medications: . acetaZOLAMIDE  250 mg Oral BID  . aspirin EC  325 mg Oral Daily  . digoxin  125 mcg Oral Daily  . enoxaparin (LOVENOX) injection  70 mg Subcutaneous QHS  . folic acid  1 mg Oral Daily  . furosemide  80 mg Intravenous Q12H  . guaiFENesin  600 mg Oral BID  . insulin aspart  0-9 Units Subcutaneous TID WC  . lipase/protease/amylase  24,000 Units Oral TID AC  . lisinopril  10 mg Oral Daily  . metoprolol succinate  25 mg Oral Daily  . pantoprazole  40 mg Oral BID  . potassium chloride  40 mEq Oral Daily  . sodium chloride  3 mL Intravenous Q12H  . sodium chloride  3 mL Intravenous Q12H  . thiamine  100 mg Oral Daily     Infusions:     PRN Medications:  acetaminophen **OR** acetaminophen, HYDROmorphone, ondansetron **OR** ondansetron (ZOFRAN) IV   Past Medical History  Diagnosis Date  . 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 Atrial fibrillation   . Chronic systolic heart failure     LVEF 40-45%  . Morbid obesity   . Chronic pain   . Narcotic dependence, episodic use   . Respiratory  failure     History of ventilatory-dependent respiratory failure and tracheostomy   . History of renal failure     Requiring hemodialysis in the past with normal renal function at this point  . History of ventral hernia repair   . Cervical compression fracture     Cervical disk disease requiring surgery  . Essential hypertension, benign   . Hyperlipidemia   . Degenerative joint disease   . Heart murmur   . MGUS (monoclonal gammopathy of unknown significance)   . Type 2 diabetes mellitus     Past Surgical History  Procedure Laterality Date  . Ventral hernia repair    . Cholecystectomy    . Tracheostomy    . Pancreas surgery    . Skin graft    . Insertion of mesh      Family History  Problem Relation Age of Onset  . Hypertension Mother   . Diabetes Mother   . Lymphoma Father     Social History Don Brown reports that he has never smoked. He has never used smokeless tobacco. Don Brown reports that he does not drink alcohol.  Review of Systems Patient denies fever, chills, headache, sweats, rash, change in vision, change in hearing, chest pain, cough, nausea or vomiting, urinary symptoms. All other systems are reviewed and are negative.  Physical Examination Blood pressure 150/78, pulse  74, temperature 98.1 F (36.7 C), temperature source Oral, resp. rate 18, height 6' (1.829 m), weight 304 lb (137.893 kg), SpO2 90 %.  Intake/Output Summary (Last 24 hours) at 02/09/14 1443 Last data filed at 02/09/14 0816  Gross per 24 hour  Intake    480 ml  Output   2625 ml  Net  -2145 ml    Patient is overweight. He is oriented to person time and place. Affect is normal. Head is atraumatic. Sclera and conjunctiva are normal. There is no jugulovenous distention. Lungs reveal scattered rales. There is no respiratory distress. Cardiac exam reveals S1 and S2. The rhythm is irregularly irregular. The abdomen is soft. There is trace peripheral edema.  Prior Cardiac  Testing/Procedures  Lab Results  Basic Metabolic Panel:  Recent Labs Lab 02/05/14 1807 02/07/14 0520 02/08/14 0625 02/09/14 0810  NA 145 146 141 141  K 4.3 4.0 4.2 3.5*  CL 107 101 98 94*  CO2 25 33* 33* 37*  GLUCOSE 111* 114* 128* 220*  BUN 17 20 20 20   CREATININE 0.68 1.02 0.85 0.92  CALCIUM 8.8 8.7 8.7 9.0  MG  --   --   --  1.9    Liver Function Tests: No results for input(s): AST, ALT, ALKPHOS, BILITOT, PROT, ALBUMIN in the last 168 hours.  CBC:  Recent Labs Lab 02/05/14 1807 02/07/14 0520 02/08/14 0625 02/09/14 0810  WBC 4.3 3.9* 4.9 5.0  HGB 12.6* 13.2 12.6* 13.4  HCT 41.4 43.8 42.7 44.6  MCV 86.1 88.3 88.0 89.2  PLT 156 153 166 159    Cardiac Enzymes:  Recent Labs Lab 02/05/14 2356 02/06/14 1117  TROPONINI <0.30 <0.30    BNP: Invalid input(s): POCBNP   Radiology: Dg Chest Port 1 View  02/08/2014   CLINICAL DATA:  Heart failure, renal failure, shortness of Breath  EXAM: PORTABLE CHEST - 1 VIEW  COMPARISON:  02/05/2014  FINDINGS: Cardiomegaly again noted. Persistent mild congestion/ edema with slight improvement in aeration. No segmental infiltrate. Degenerative changes thoracic spine.  IMPRESSION: Cardiomegaly. Persistent mild congestion/ pulmonary edema with slight improvement in aeration.   Electronically Signed   By: Lahoma Crocker M.D.   On: 02/08/2014 11:07   EKG:   I have reviewed the EKG this admission. There is atrial fibrillation with nonspecific ST-T wave changes.  Telemetry:  I have reviewed telemetry today February 09, 2014. There is atrial fibrillation. The rate is controlled.   Impression and Recommendations    Acute on chronic systolic CHF     I agree with the continued use of IV Lasix twice a day until his volume is completely stable. He can then be changed to oral Lasix. Careful attention to his potassium is important.    Nonischemic cardiomyopathy     He is on an ACE inhibitor and a beta blocker. It would be optimal to use  carvedilol instead metoprolol at this is possible. The doses of his meds could be increased with his current blood pressure. In addition spironolactone would be appropriate if it can be overseen. However the patient is managed at the [place]. I feel it would not be prudent to change his meds significantly.    Chronic atrial fibrillation     His rate is controlled. No change in therapy.      HTN (hypertension), benign     He has leeway for further up titration of his medicines for his cardiomyopathy.  It is my [facility] will be greater than 48 hours. He will be most important to have his volume status completely stable before he goes home. Of course the next important thing will be to be sure that he has his home meds and that he has an early post hospital CHF follow-up. This is all to be handled through the [place].  Daryel November, MD 02/09/2014, 2:43 PM

## 2014-02-09 NOTE — Progress Notes (Signed)
Pt refused to go to the [place]. Case management, MD, and RN all involved and explaining the benefits of being transferred to the [place], but pt still refused. Pt to remain an inpatient at this time.

## 2014-02-10 LAB — GLUCOSE, CAPILLARY
GLUCOSE-CAPILLARY: 146 mg/dL — AB (ref 70–99)
GLUCOSE-CAPILLARY: 145 mg/dL — AB (ref 70–99)
GLUCOSE-CAPILLARY: 191 mg/dL — AB (ref 70–99)
Glucose-Capillary: 200 mg/dL — ABNORMAL HIGH (ref 70–99)

## 2014-02-10 LAB — CBC
HEMATOCRIT: 48 % (ref 39.0–52.0)
Hemoglobin: 14.9 g/dL (ref 13.0–17.0)
MCH: 27.3 pg (ref 26.0–34.0)
MCHC: 31 g/dL (ref 30.0–36.0)
MCV: 87.9 fL (ref 78.0–100.0)
Platelets: 184 10*3/uL (ref 150–400)
RBC: 5.46 MIL/uL (ref 4.22–5.81)
RDW: 16.4 % — ABNORMAL HIGH (ref 11.5–15.5)
WBC: 4.9 10*3/uL (ref 4.0–10.5)

## 2014-02-10 LAB — BASIC METABOLIC PANEL
Anion gap: 11 (ref 5–15)
BUN: 23 mg/dL (ref 6–23)
CO2: 35 meq/L — AB (ref 19–32)
CREATININE: 1.01 mg/dL (ref 0.50–1.35)
Calcium: 9.4 mg/dL (ref 8.4–10.5)
Chloride: 95 mEq/L — ABNORMAL LOW (ref 96–112)
GFR calc Af Amer: >90 mL/min (ref >90)
GFR calc non Af Amer: 80 mL/min — ABNORMAL LOW (ref >90)
GLUCOSE: 146 mg/dL — AB (ref 70–99)
Potassium: 3.3 mEq/L — ABNORMAL LOW (ref 3.7–5.3)
Sodium: 141 mEq/L (ref 137–147)

## 2014-02-10 LAB — PRO B NATRIURETIC PEPTIDE: Pro B Natriuretic peptide (BNP): 829.3 pg/mL — ABNORMAL HIGH (ref 0–125)

## 2014-02-10 MED ORDER — METOPROLOL SUCCINATE ER 25 MG PO TB24
50.0000 mg | ORAL_TABLET | Freq: Every day | ORAL | Status: DC
  Administered 2014-02-11 – 2014-02-12 (×2): 50 mg via ORAL
  Filled 2014-02-10 (×2): qty 2

## 2014-02-10 MED ORDER — METOPROLOL SUCCINATE ER 25 MG PO TB24
25.0000 mg | ORAL_TABLET | Freq: Every day | ORAL | Status: AC
  Administered 2014-02-10: 25 mg via ORAL
  Filled 2014-02-10: qty 1

## 2014-02-10 MED ORDER — LORAZEPAM 0.5 MG PO TABS
0.5000 mg | ORAL_TABLET | Freq: Once | ORAL | Status: AC
  Administered 2014-02-10: 0.5 mg via ORAL
  Filled 2014-02-10: qty 1

## 2014-02-10 NOTE — Progress Notes (Signed)
Patient was to be transferred to the [facility] yesterday, however he refused transfer.

## 2014-02-10 NOTE — Progress Notes (Signed)
Patient Name: Don Brown Date of Encounter: 02/10/2014  Principal Problem:   Acute on chronic combined systolic and diastolic CHF (congestive heart failure) Active Problems:   Nonischemic cardiomyopathy   Chronic pancreatitis   Morbid obesity   Chest pain   Chronic atrial fibrillation   Diabetes mellitus type 2, controlled   HTN (hypertension), benign   Length of Stay: 5  SUBJECTIVE  No angina. No dyspnea at rest. Has not walked at all today, not even to the bathroom.  CURRENT MEDS . acetaZOLAMIDE  250 mg Oral BID  . aspirin EC  325 mg Oral Daily  . digoxin  125 mcg Oral Daily  . enoxaparin (LOVENOX) injection  70 mg Subcutaneous QHS  . folic acid  1 mg Oral Daily  . furosemide  80 mg Intravenous Q12H  . guaiFENesin  600 mg Oral BID  . insulin aspart  0-9 Units Subcutaneous TID WC  . lipase/protease/amylase  24,000 Units Oral TID AC  . lisinopril  10 mg Oral Daily  . metoprolol succinate  25 mg Oral Daily  . pantoprazole  40 mg Oral BID  . potassium chloride  40 mEq Oral Daily  . sodium chloride  3 mL Intravenous Q12H  . sodium chloride  3 mL Intravenous Q12H  . thiamine  100 mg Oral Daily    OBJECTIVE   Intake/Output Summary (Last 24 hours) at 02/10/14 1027 Last data filed at 02/10/14 0949  Gross per 24 hour  Intake   1280 ml  Output   2475 ml  Net  -1195 ml   Filed Weights   02/08/14 0540 02/09/14 0624 02/10/14 0515  Weight: 312 lb 14.4 oz (141.931 kg) 304 lb (137.893 kg) 301 lb 9.6 oz (136.805 kg)    PHYSICAL EXAM Filed Vitals:   02/09/14 1036 02/09/14 1255 02/09/14 2149 02/10/14 0515  BP: 140/62 150/78 124/72 130/65  Pulse:  74 54 97  Temp:  98.1 F (36.7 C) 98.2 F (36.8 C) 98.5 F (36.9 C)  TempSrc:  Oral Oral Oral  Resp:  18 18 18   Height:      Weight:    301 lb 9.6 oz (136.805 kg)  SpO2:  90% 100% 100%  .  General: Alert, oriented x3, no distress. Obesity limits the exam Head: no evidence of trauma, PERRL, EOMI, no exophtalmos or lid  lag, no myxedema, no xanthelasma; normal ears, nose and oropharynx Neck: cannot evaluate jugular venous pulsations and no hepatojugular reflux; brisk carotid pulses without delay and no carotid bruits Chest: clear to auscultation, no signs of consolidation by percussion or palpation, normal fremitus, symmetrical and full respiratory excursions Cardiovascular: laterally displaced apical impulse, irregular rhythm, normal first and second heart sounds, no rubs, +S3 gallop, no murmur Abdomen: no tenderness or distention, no masses by palpation, no abnormal pulsatility or arterial bruits, normal bowel sounds, no hepatosplenomegaly Extremities: no clubbing, cyanosis or edema; 2+ radial, ulnar and brachial pulses bilaterally; 2+ right femoral, posterior tibial and dorsalis pedis pulses; 2+ left femoral, posterior tibial and dorsalis pedis pulses; no subclavian or femoral bruits Neurological: grossly nonfocal  LABS  CBC  Recent Labs  02/09/14 0810 02/10/14 0810  WBC 5.0 4.9  HGB 13.4 14.9  HCT 44.6 48.0  MCV 89.2 87.9  PLT 159 242   Basic Metabolic Panel  Recent Labs  02/09/14 0810 02/10/14 0810  NA 141 141  K 3.5* 3.3*  CL 94* 95*  CO2 37* 35*  GLUCOSE 220* 146*  BUN 20 23  CREATININE 0.92  1.01  CALCIUM 9.0 9.4  MG 1.9  --     Radiology Studies Imaging results have been reviewed and Dg Chest Port 1 View  02/08/2014   CLINICAL DATA:  Heart failure, renal failure, shortness of Breath  EXAM: PORTABLE CHEST - 1 VIEW  COMPARISON:  02/05/2014  FINDINGS: Cardiomegaly again noted. Persistent mild congestion/ edema with slight improvement in aeration. No segmental infiltrate. Degenerative changes thoracic spine.  IMPRESSION: Cardiomegaly. Persistent mild congestion/ pulmonary edema with slight improvement in aeration.   Electronically Signed   By: Lahoma Crocker M.D.   On: 02/08/2014 11:07    TELE AF with slow ventricular response  ECG AF with rapid ventricular response  (110s)  ASSESSMENT AND PLAN Acute on chronic combined systolic and diastolic heart failure improving with diuretics. Reportedly nonischemic CMP. Atrial fibrillation (chronic) with RVR  - increase beta blocker. He is at substantial risk for embolic stroke and should be on chronic anticoagulation - his medication compliance issues make all agents poor choices. May have to leave it to his [place] doctors. If BP allows after increase in metoprolol, increase ACEi as well.   Sanda Klein, MD, [facility] [phone] office [phone] pager 02/10/2014 10:27 AM

## 2014-02-10 NOTE — Plan of Care (Signed)
Problem: Phase III Progression Outcomes Goal: No anginal pain Outcome: Completed/Met Date Met:  02/10/14

## 2014-02-10 NOTE — Evaluation (Signed)
Physical Therapy Evaluation Patient Details Name: Don Brown MRN: [id number] DOB: [birth year] Today's Date: 02/10/2014   History of Present Illness  admit with acute on chronic systolic and diastolic CHF with hx of DM, HTN, and morbid obesity  Clinical Impression  Difficult to truly assess pt and pt's PLOF clearly. Limited today with ambulation due to fatigue and seemed to be diaphoretic/symptomatic but BPs were okay once returned to sitting. To benefit from continued PT to increase mobility in order to return home with family.      Follow Up Recommendations Home health PT    Equipment Recommendations  None recommended by PT    Recommendations for Other Services       Precautions / Restrictions        Mobility  Bed Mobility               General bed mobility comments: unsure, entered and pt sitting EOB stating he had just returned [place]the [place] with his RW.   Transfers Overall transfer level: Needs assistance Equipment used: Rolling walker (2 wheeled) Transfers: Sit to/from Stand Sit to Stand: Min assist         General transfer comment: cues for safaety  Ambulation/Gait Ambulation/Gait assistance: Min assist Ambulation Distance (Feet): 30 Feet Assistive device: Rolling walker (2 wheeled) Gait Pattern/deviations: Step-through pattern     General Gait Details: limited distance due to pt with head tilted to left side most of session and kept closing his eyes, and when I asked if he was okay during walkign his answer was vague, so too precasution due to pt looked like he was getting weak and diaphoretci, so pulled recliner behind pt to sit him down. took BPS aand elveated his feeet, 107/65 and HR 105 and )2 sat on RA during ambulation was 93 % decreseing to 87 % after sitting. returned pt on O2 [place] 2 L.   Stairs            Wheelchair Mobility    Modified Rankin (Stroke Patients Only)       Balance                                              Pertinent Vitals/Pain Pain Assessment:  (did not report any pain during session except when went to lie recliner back and c/o hernia pain. in abdomen. )    Home Living Family/patient expects to be discharged to:: Private residence Living Arrangements: Children Available Help at Discharge: Family Type of Home: Apartment Home Access: Stairs to enter Entrance Stairs-Rails: Left Entrance Stairs-Number of Steps: 20  (pt reproted 20 steps to enter????, assuming one flight so around 9. ) Home Layout: One level Home Equipment: Walker - 2 wheels      Prior Function Level of Independence: Independent with assistive device(s)         Comments: pt very vague with questions for PLOF, stated it was very hard to get around, used RW, but drove, and had steps to enter and these were hard . Difficult getting clear PLOF from pt, very short with answers.      Hand Dominance        Extremity/Trunk Assessment               Lower Extremity Assessment: Generalized weakness         Communication   Communication: No difficulties  Cognition Arousal/Alertness: Lethargic (seemed lethargic during session , had to constantly ask if he was groggy or asleep because pt closing his eyes, but he stated no. So very unclear of his state. ) Behavior During Therapy: Flat affect Overall Cognitive Status: Within Functional Limits for tasks assessed (however difficult to assess and unclear of baseline because pt not very infomrative of PLOF or current state. )                      General Comments      Exercises        Assessment/Plan    PT Assessment    PT Diagnosis Difficulty walking;Generalized weakness   PT Problem List    PT Treatment Interventions     PT Goals (Current goals can be found in the Care Plan section) Acute Rehab PT Goals Patient Stated Goal: to get home with my family for thanksgiving... am I going home by Monday?  PT Goal Formulation: With  patient Time For Goal Achievement: 02/24/14 Potential to Achieve Goals: Good    Frequency     Barriers to discharge        Co-evaluation               End of Session   Activity Tolerance: Patient limited by fatigue (unclear of lethagy or diaphoretic or neither, pt not infomrative and BPS seemed normal, but pt was a little clammy. ) Patient left: in chair;with family/visitor present Nurse Communication: Mobility status         Time: 4497-5300 PT Time Calculation (min) (ACUTE ONLY): 28 min   Charges:   PT Evaluation $Initial PT Evaluation Tier I: 1 Procedure PT Treatments $Gait Training: 8-22 mins   PT G CodesClide Dales 02/10/2014, 6:18 PM  Clide Dales, PT Pager: [phone] 02/10/2014

## 2014-02-10 NOTE — Progress Notes (Signed)
OT Cancellation Note  Patient Details Name: Don Brown MRN: [id number] DOB: [birth year]   Cancelled Treatment:    Reason Eval/Treat Not Completed: Fatigue/lethargy limiting ability to participate - Pt sleeping.  Unable to adequately arouse pt to participate.  He will open his eyes and nod his head, but then closes eyes with every attempt to awaken him.  Will reattempt.   Darlina Rumpf Simpson, OTR/L [phone]  02/10/2014, 11:25 AM

## 2014-02-10 NOTE — Progress Notes (Signed)
[facility] PROGRESS NOTE  Don Brown Don Brown:[id number] DOB: [birth year] DOA: 02/05/2014 PCP: PROVIDER NOT IN SYSTEM  Assessment/Plan: #1 Acute on chronic combined CHF exac/Nonischemic cardiomyopathy Likely secondary to medical noncompliance. Cardiac enzymes negative 2. 2 d echo from 05/01/13 with mild LVH and EF 20-25% with diffuse hypokinesis. Patient still volume overloaded. Clinical improvement. I/O = -665/24hr. Wt at 136.1kg. Continue oral Lasix to IV Lasix 80 mg q12..Continue aspirin, digoxin, lisinopril, metoprolol. Cardiology ff.  #2 chronic atrial fibrillation Increase metoprolol per cardiology. Per admission H&P patient was told not to be on anticoagulation secondary to procedures in the past. Patient with poor medical compliance. Continue aspirin. Outpatient follow-up.  #3 morbid obesity  #4 chest pain May have been likely secondary to problem #1. Cardiac enzymes negative 3. CT of the chest negative for PE or acute aortic dissection. Clinical improvement. Follow.  #5 type 2 diabetes Hemoglobin A1c from 10/03/2013 was 6.7.CBGs have ranged from 146-200. Continue sliding scale insulin.  #6 chronic pancreatitis Stable. Continue Creon.  #7 hypertension Continue metoprolol. Continue lisinopril at 10 mg daily.  #8 prophylaxis PPI for GI prophylaxis. Lovenox for DVT prophylaxis.  Code Status: Full Family Communication: updated patient and daughter at bedside. Disposition Plan: Remain inpatient.   Consultants:  Cardiology : Dr Ron Parker 02/09/14  Procedures:  CT angio chest 02/06/14  CXR 02/05/14  Antibiotics:  None  HPI/Subjective: Patient states feeling better. SOB improving.  Objective: Filed Vitals:   02/10/14 0515  BP: 130/65  Pulse: 97  Temp: 98.5 F (36.9 C)  Resp: 18    Intake/Output Summary (Last 24 hours) at 02/10/14 0923 Last data filed at 02/10/14 8676  Gross per 24 hour  Intake    920 ml  Output   2175 ml  Net  -1255 ml   Filed Weights    02/08/14 0540 02/09/14 0624 02/10/14 0515  Weight: 141.931 kg (312 lb 14.4 oz) 137.893 kg (304 lb) 136.805 kg (301 lb 9.6 oz)    Exam:   General:  NAD  Cardiovascular: Irregularly irregular.   Respiratory: Bibasilar crackles  Abdomen: Soft/NT/ND/+BS  Musculoskeletal: No c/c/. Trace BLE edema  Data Reviewed: Basic Metabolic Panel:  Recent Labs Lab 02/05/14 1807 02/07/14 0520 02/08/14 0625 02/09/14 0810 02/10/14 0810  NA 145 146 141 141 141  K 4.3 4.0 4.2 3.5* 3.3*  CL 107 101 98 94* 95*  CO2 25 33* 33* 37* 35*  GLUCOSE 111* 114* 128* 220* 146*  BUN 17 20 20 20 23   CREATININE 0.68 1.02 0.85 0.92 1.01  CALCIUM 8.8 8.7 8.7 9.0 9.4  MG  --   --   --  1.9  --    Liver Function Tests: No results for input(s): AST, ALT, ALKPHOS, BILITOT, PROT, ALBUMIN in the last 168 hours.  Recent Labs Lab 02/05/14 1807  LIPASE 9*   No results for input(s): AMMONIA in the last 168 hours. CBC:  Recent Labs Lab 02/05/14 1807 02/07/14 0520 02/08/14 0625 02/09/14 0810 02/10/14 0810  WBC 4.3 3.9* 4.9 5.0 4.9  HGB 12.6* 13.2 12.6* 13.4 14.9  HCT 41.4 43.8 42.7 44.6 48.0  MCV 86.1 88.3 88.0 89.2 87.9  PLT 156 153 166 159 184   Cardiac Enzymes:  Recent Labs Lab 02/05/14 2356 02/06/14 1117  TROPONINI <0.30 <0.30   BNP (last 3 results)  Recent Labs  02/07/14 0535 02/09/14 0810 02/10/14 0819  PROBNP 5583.0* 1861.0* 829.3*   CBG:  Recent Labs Lab 02/09/14 0732 02/09/14 1216 02/09/14 1653 02/09/14 2152 02/10/14 0755  GLUCAP 129* 143* 169* 172* 146*    No results found for this or any previous visit (from the past 240 hour(s)).   Studies: Dg Chest Port 1 View  02/08/2014   CLINICAL DATA:  Heart failure, renal failure, shortness of Breath  EXAM: PORTABLE CHEST - 1 VIEW  COMPARISON:  02/05/2014  FINDINGS: Cardiomegaly again noted. Persistent mild congestion/ edema with slight improvement in aeration. No segmental infiltrate. Degenerative changes thoracic  spine.  IMPRESSION: Cardiomegaly. Persistent mild congestion/ pulmonary edema with slight improvement in aeration.   Electronically Signed   By: Lahoma Crocker M.D.   On: 02/08/2014 11:07    Scheduled Meds: . acetaZOLAMIDE  250 mg Oral BID  . aspirin EC  325 mg Oral Daily  . digoxin  125 mcg Oral Daily  . enoxaparin (LOVENOX) injection  70 mg Subcutaneous QHS  . folic acid  1 mg Oral Daily  . furosemide  80 mg Intravenous Q12H  . guaiFENesin  600 mg Oral BID  . insulin aspart  0-9 Units Subcutaneous TID WC  . lipase/protease/amylase  24,000 Units Oral TID AC  . lisinopril  10 mg Oral Daily  . metoprolol succinate  25 mg Oral Daily  . pantoprazole  40 mg Oral BID  . potassium chloride  40 mEq Oral Daily  . sodium chloride  3 mL Intravenous Q12H  . sodium chloride  3 mL Intravenous Q12H  . thiamine  100 mg Oral Daily   Continuous Infusions:   Principal Problem:   Acute on chronic combined systolic and diastolic CHF (congestive heart failure) Active Problems:   Nonischemic cardiomyopathy   Chronic pancreatitis   Morbid obesity   Chest pain   Chronic atrial fibrillation   Diabetes mellitus type 2, controlled   HTN (hypertension), benign    Time spent: [place] [facility] Pager [phone]. If 7PM-7AM, please contact night-coverage at [email or web address], password [facility] 02/10/2014, 9:23 AM  LOS: 5 days

## 2014-02-10 NOTE — Progress Notes (Signed)
Patient is requesting medication to help him relax.  PCP on call was notified.

## 2014-02-11 LAB — CBC
HCT: 49.5 % (ref 39.0–52.0)
HEMOGLOBIN: 15.3 g/dL (ref 13.0–17.0)
MCH: 26.9 pg (ref 26.0–34.0)
MCHC: 30.9 g/dL (ref 30.0–36.0)
MCV: 87 fL (ref 78.0–100.0)
Platelets: 140 10*3/uL — ABNORMAL LOW (ref 150–400)
RBC: 5.69 MIL/uL (ref 4.22–5.81)
RDW: 16.7 % — ABNORMAL HIGH (ref 11.5–15.5)
WBC: 4.3 10*3/uL (ref 4.0–10.5)

## 2014-02-11 LAB — BASIC METABOLIC PANEL
ANION GAP: 12 (ref 5–15)
BUN: 34 mg/dL — ABNORMAL HIGH (ref 6–23)
CHLORIDE: 97 meq/L (ref 96–112)
CO2: 31 mEq/L (ref 19–32)
Calcium: 9.4 mg/dL (ref 8.4–10.5)
Creatinine, Ser: 1.2 mg/dL (ref 0.50–1.35)
GFR calc non Af Amer: 65 mL/min — ABNORMAL LOW (ref >90)
GFR, EST AFRICAN AMERICAN: 75 mL/min — AB (ref >90)
Glucose, Bld: 137 mg/dL — ABNORMAL HIGH (ref 70–99)
POTASSIUM: 3.5 meq/L — AB (ref 3.7–5.3)
Sodium: 140 mEq/L (ref 137–147)

## 2014-02-11 LAB — GLUCOSE, CAPILLARY
Glucose-Capillary: 137 mg/dL — ABNORMAL HIGH (ref 70–99)
Glucose-Capillary: 152 mg/dL — ABNORMAL HIGH (ref 70–99)
Glucose-Capillary: 227 mg/dL — ABNORMAL HIGH (ref 70–99)
Glucose-Capillary: 174 mg/dL — ABNORMAL HIGH (ref 70–99)

## 2014-02-11 LAB — PRO B NATRIURETIC PEPTIDE: Pro B Natriuretic peptide (BNP): 539.9 pg/mL — ABNORMAL HIGH (ref 0–125)

## 2014-02-11 MED ORDER — FUROSEMIDE 40 MG PO TABS
80.0000 mg | ORAL_TABLET | Freq: Two times a day (BID) | ORAL | Status: DC
Start: 2014-02-11 — End: 2014-02-12
  Administered 2014-02-11 – 2014-02-12 (×3): 80 mg via ORAL
  Filled 2014-02-11 (×3): qty 2

## 2014-02-11 NOTE — Progress Notes (Signed)
Patient Name: Don Brown Date of Encounter: 02/11/2014  Principal Problem:   Acute on chronic combined systolic and diastolic CHF (congestive heart failure) Active Problems:   Nonischemic cardiomyopathy   Chronic pancreatitis   Morbid obesity   Chest pain   Chronic atrial fibrillation   Diabetes mellitus type 2, controlled   HTN (hypertension), benign   Length of Stay: 6  SUBJECTIVE  Improved. Lying fully supine in bed. No dyspnea with limited activity. The only thing he is interested in discussing is pain medication - he is not satisfied with our choice of analgesics.  CURRENT MEDS . acetaZOLAMIDE  250 mg Oral BID  . aspirin EC  325 mg Oral Daily  . digoxin  125 mcg Oral Daily  . enoxaparin (LOVENOX) injection  70 mg Subcutaneous QHS  . folic acid  1 mg Oral Daily  . furosemide  80 mg Intravenous Q12H  . guaiFENesin  600 mg Oral BID  . insulin aspart  0-9 Units Subcutaneous TID WC  . lipase/protease/amylase  24,000 Units Oral TID AC  . lisinopril  10 mg Oral Daily  . metoprolol succinate  50 mg Oral Daily  . pantoprazole  40 mg Oral BID  . potassium chloride  40 mEq Oral Daily  . sodium chloride  3 mL Intravenous Q12H  . sodium chloride  3 mL Intravenous Q12H  . thiamine  100 mg Oral Daily    OBJECTIVE   Intake/Output Summary (Last 24 hours) at 02/11/14 0926 Last data filed at 02/11/14 0338  Gross per 24 hour  Intake    960 ml  Output   1400 ml  Net   -440 ml   Filed Weights   02/09/14 0624 02/10/14 0515 02/11/14 0604  Weight: 304 lb (137.893 kg) 301 lb 9.6 oz (136.805 kg) 304 lb 7.3 oz (138.1 kg)    PHYSICAL EXAM Filed Vitals:   02/10/14 1341 02/10/14 1700 02/10/14 2132 02/11/14 0604  BP: 127/64 137/78 107/59 108/78  Pulse: 84 88 80 70  Temp: 98.2 F (36.8 C) 97.7 F (36.5 C) 98.2 F (36.8 C) 97 F (36.1 C)  TempSrc: Oral Oral Axillary Axillary  Resp: 19 18 18 18   Height:      Weight:    304 lb 7.3 oz (138.1 kg)  SpO2: 98% 99% 99% 94%    General: Alert, oriented x3, no distress. Obesity limits the exam Head: no evidence of trauma, PERRL, EOMI, no exophtalmos or lid lag, no myxedema, no xanthelasma; normal ears, nose and oropharynx Neck: cannot evaluate jugular venous pulsations and no hepatojugular reflux; brisk carotid pulses without delay and no carotid bruits Chest: clear to auscultation, no signs of consolidation by percussion or palpation, normal fremitus, symmetrical and full respiratory excursions Cardiovascular: laterally displaced apical impulse, irregular rhythm, normal first and second heart sounds, no rubs, +S3 gallop, no murmur Abdomen: no tenderness or distention, no masses by palpation, no abnormal pulsatility or arterial bruits, normal bowel sounds, no hepatosplenomegaly Extremities: no clubbing, cyanosis or edema; 2+ radial, ulnar and brachial pulses bilaterally; 2+ right femoral, posterior tibial and dorsalis pedis pulses; 2+ left femoral, posterior tibial and dorsalis pedis pulses; no subclavian or femoral bruits Neurological: grossly nonfocal LABS  CBC  Recent Labs  02/10/14 0810 02/11/14 0443  WBC 4.9 4.3  HGB 14.9 15.3  HCT 48.0 49.5  MCV 87.9 87.0  PLT 184 785*   Basic Metabolic Panel  Recent Labs  02/09/14 0810 02/10/14 0810 02/11/14 0443  NA 141 141 140  K  3.5* 3.3* 3.5*  CL 94* 95* 97  CO2 37* 35* 31  GLUCOSE 220* 146* 137*  BUN 20 23 34*  CREATININE 0.92 1.01 1.20  CALCIUM 9.0 9.4 9.4  MG 1.9  --   --    Liver Function Tests  Radiology Studies Imaging results have been reviewed and No results found.  TELE AF with ventricular rate 90-100  ASSESSMENT AND PLAN  Better AF rate control after increasing beta blocker dose Markedly improved symptoms/signs of HF - hard to know what his true "dry weight" is. BUN/ creat slightly increased. I think we can transition to PO diuretics today, possibly DC from CHF point of view in next 24h. Recommend chronic anticoagulation, but it is  hard to identify a safe and effective regimen due to his noncompliance.   Sanda Klein, MD, [facility] [phone] office [phone] pager 02/11/2014 9:26 AM

## 2014-02-11 NOTE — Evaluation (Signed)
Occupational Therapy Evaluation Patient Details Name: Kaien Pezzullo MRN: [id number] DOB: 1956-02-12 Today's Date: 02/11/2014    History of Present Illness admit with acute on chronic systolic and diastolic CHF with hx of DM, HTN, and morbid obesity   Clinical Impression   Pt initially stating he didn't want to work with OT as he didn't sleep well and wants to rest. Explained role of OT and with increased time, pt did agree to get up to work on sponge bathing. Pt overall deconditioned and fatigues easily with activity. He will benefit from acute OT to progress independence with self care tasks.     Follow Up Recommendations  Home health OT    Equipment Recommendations  3 in 1 bedside comode (if pt agreeable)    Recommendations for Other Services       Precautions / Restrictions Precautions Precautions: Fall      Mobility Bed Mobility Overal bed mobility: Needs Assistance Bed Mobility: Supine to Sit;Sit to Supine     Supine to sit: Min guard Sit to supine: Min guard   General bed mobility comments: min guard as pt uses momentum to get in and out of bed. min guard for safety.   Transfers Overall transfer level: Needs assistance Equipment used: Rolling walker (2 wheeled) Transfers: Sit to/from Stand Sit to Stand: Min guard         General transfer comment: cues for safety    Balance                                            ADL Overall ADL's : Needs assistance/impaired Eating/Feeding: Independent;Sitting   Grooming: Wash/dry hands;Min guard;Standing   Upper Body Bathing: Set up;Sitting   Lower Body Bathing: Minimal assistance;Sit to/from stand   Upper Body Dressing : Set up;Sitting   Lower Body Dressing: Moderate assistance;Sit to/from stand   Toilet Transfer: Min guard;Ambulation;RW   Toileting- Water quality scientist and Hygiene: Min guard;Sit to/from stand         General ADL Comments: Pt initially stating he didnt feel up to  bathing or any ADL but then agreed to work on bath at [place]. Pt states he cant reach down to feet to wash or don clothing. He usually has to have children assist before they leave in the morning. Discussed AE options for LB self care and pt interested. He fatigues quickly and with bathing he states he is tired after completing this task. Pt needing to have BM so assisted up to bathroom to toilet using walker. (needs wide walker but only had regular walker available). Pt tending to push aside walker at times and take a few steps to the sink without it. Pt stating he gets fatigued with showering and discussed shower seat but he states his current shower is too small and he hopes "to get out of there" (his aparrment) soon. Also discussed 3in1 option to help with transitions on and off toilet as he states his is low and no grab bars but pt still stating, "I hope to get out of there." Pt pulled off leads when getting up to go to the bathroom despite cues not to. Nursing made aware and came back as OT assisting pt back to bed to reattach leads.      Vision  Perception     Praxis      Pertinent Vitals/Pain Pain Assessment: 0-10 Pain Score: 6  Pain Location: abdomen Pain Descriptors / Indicators: Aching Pain Intervention(s): Repositioned;Monitored during session     Hand Dominance     Extremity/Trunk Assessment Upper Extremity Assessment Upper Extremity Assessment: Generalized weakness           Communication Communication Communication: No difficulties   Cognition Arousal/Alertness: Awake/alert Behavior During Therapy: Flat affect Overall Cognitive Status: Within Functional Limits for tasks assessed                     General Comments       Exercises       Shoulder Instructions      Home Living Family/patient expects to be discharged to:: Private residence Living Arrangements: Children Available Help at Discharge: Family Type of Home:  Apartment Home Access: Stairs to enter [employer] of Steps: 20  (pt reproted 20 steps to enter????, assuming one flight so around 9. ) Entrance Stairs-Rails: Left Home Layout: One level     Bathroom Shower/Tub: Occupational psychologist: Standard     Home Equipment: Environmental consultant - 2 wheels          Prior Functioning/Environment Level of Independence: Independent with assistive device(s);Needs assistance    ADL's / Homemaking Assistance Needed: pt states children usually help him put on socks and shoes before they leave.   Comments: pt very vague with questions for PLOF, stated it was very hard to get around, used RW, but drove, and had steps to enter and these were hard . Difficult getting clear PLOF from pt, very short with answers.     OT Diagnosis: Generalized weakness   OT Problem List: Decreased strength;Decreased knowledge of use of DME or AE;Decreased activity tolerance   OT Treatment/Interventions: Self-care/ADL training;Patient/family education;Therapeutic activities;DME and/or AE instruction    OT Goals(Current goals can be found in the care plan section) Acute Rehab OT Goals Patient Stated Goal: to get some sleep OT Goal Formulation: With patient Time For Goal Achievement: 02/25/14 Potential to Achieve Goals: Good  OT Frequency: Min 2X/week   Barriers to D/C:            Co-evaluation              End of Session Equipment Utilized During Treatment: Rolling walker  Activity Tolerance: Patient limited by fatigue Patient left: in bed;with call bell/phone within reach   Time: 1120-1200 OT Time Calculation (min): 40 min Charges:  OT General Charges $OT Visit: 1 Procedure OT Evaluation $Initial OT Evaluation Tier I: 1 Procedure OT Treatments $Self Care/Home Management : 8-22 mins $Therapeutic Activity: 8-22 mins G-Codes:    Jules Schick  [phone] 02/11/2014, 12:13 PM

## 2014-02-11 NOTE — Plan of Care (Signed)
Problem: Phase II Progression Outcomes Goal: Stress Test if indicated Outcome: Not Applicable Date Met:  68/03/21 Goal: Cath/PCI Day Path if indicated Outcome: Not Applicable Date Met:  22/48/25  Problem: Phase III Progression Outcomes Goal: Cath/PCI Path as indicated Outcome: Not Applicable Date Met:  00/37/04 Goal: Vascular site scale level 0 - I Vascular Site Scale Level 0: No bruising/bleeding/hematoma Level I (Mild): Bruising/Ecchymosis, minimal bleeding/ooozing, palpable hematoma < 3 cm Level II (Moderate): Bleeding not affecting hemodynamic parameters, pseudoaneurysm, palpable hematoma > 3 cm Level III (Severe) Bleeding which affects hemodynamic parameters or retroperitoneal hemorrhage  Outcome: Not Applicable Date Met:  88/89/16 Goal: If positive for MI, change to MI Path Outcome: Not Applicable Date Met:  94/50/38

## 2014-02-11 NOTE — Progress Notes (Signed)
[facility] PROGRESS NOTE  Don Brown JJO:[id number] DOB: 1955/04/21 DOA: 02/05/2014 PCP: PROVIDER NOT IN SYSTEM  Assessment/Plan: #1 Acute on chronic combined CHF exac/Nonischemic cardiomyopathy Likely secondary to medical noncompliance. Cardiac enzymes negative 2. 2 d echo from 05/01/13 with mild LVH and EF 20-25% with diffuse hypokinesis. Patient with clinical improvement. Clinical improvement. I/O = -790/24hr. Wt at 138.1kg. IV Lasix has been changed to oral lasix per cardiology. Continue aspirin, digoxin, lisinopril, metoprolol. Cardiology ff.  #2 chronic atrial fibrillation Continue increased metoprolol has improved rate. Per admission H&P patient was told not to be on anticoagulation secondary to procedures in the past. Patient with poor medical compliance. Continue aspirin. Outpatient follow-up.  #3 morbid obesity  #4 chest pain May have been likely secondary to problem #1. Cardiac enzymes negative 3. CT of the chest negative for PE or acute aortic dissection. Clinical improvement. Follow.  #5 type 2 diabetes Hemoglobin A1c from 10/03/2013 was 6.7.CBGs have ranged from 137-227. Continue sliding scale insulin.  #6 chronic pancreatitis Stable. Continue Creon.  #7 hypertension Continue metoprolol,digoxin, lisinopril, lasix.  #8 prophylaxis PPI for GI prophylaxis. Lovenox for DVT prophylaxis.  Code Status: Full Family Communication: updated patient, no family at bedside. Disposition Plan: Remain inpatient.   Consultants:  Cardiology : Dr Ron Parker 02/09/14  Procedures:  CT angio chest 02/06/14  CXR 02/05/14  Antibiotics:  None  HPI/Subjective: Patient states feeling better. SOB improved.  Objective: Filed Vitals:   02/11/14 1321  BP: 111/66  Pulse: 78  Temp: 98.1 F (36.7 C)  Resp: 18    Intake/Output Summary (Last 24 hours) at 02/11/14 1705 Last data filed at 02/11/14 1445  Gross per 24 hour  Intake    840 ml  Output   1000 ml  Net   -160 ml    Filed Weights   02/09/14 0624 02/10/14 0515 02/11/14 0604  Weight: 137.893 kg (304 lb) 136.805 kg (301 lb 9.6 oz) 138.1 kg (304 lb 7.3 oz)    Exam:   General:  NAD  Cardiovascular: Irregularly irregular.   Respiratory: CTAB  Abdomen: Soft/NT/ND/+BS  Musculoskeletal: No c/c/e  Data Reviewed: Basic Metabolic Panel:  Recent Labs Lab 02/07/14 0520 02/08/14 0625 02/09/14 0810 02/10/14 0810 02/11/14 0443  NA 146 141 141 141 140  K 4.0 4.2 3.5* 3.3* 3.5*  CL 101 98 94* 95* 97  CO2 33* 33* 37* 35* 31  GLUCOSE 114* 128* 220* 146* 137*  BUN 20 20 20 23  34*  CREATININE 1.02 0.85 0.92 1.01 1.20  CALCIUM 8.7 8.7 9.0 9.4 9.4  MG  --   --  1.9  --   --    Liver Function Tests: No results for input(s): AST, ALT, ALKPHOS, BILITOT, PROT, ALBUMIN in the last 168 hours.  Recent Labs Lab 02/05/14 1807  LIPASE 9*   No results for input(s): AMMONIA in the last 168 hours. CBC:  Recent Labs Lab 02/07/14 0520 02/08/14 0625 02/09/14 0810 02/10/14 0810 02/11/14 0443  WBC 3.9* 4.9 5.0 4.9 4.3  HGB 13.2 12.6* 13.4 14.9 15.3  HCT 43.8 42.7 44.6 48.0 49.5  MCV 88.3 88.0 89.2 87.9 87.0  PLT 153 166 159 184 140*   Cardiac Enzymes:  Recent Labs Lab 02/05/14 2356 02/06/14 1117  TROPONINI <0.30 <0.30   BNP (last 3 results)  Recent Labs  02/09/14 0810 02/10/14 0819 02/11/14 0441  PROBNP 1861.0* 829.3* 539.9*   CBG:  Recent Labs Lab 02/10/14 1639 02/10/14 2148 02/11/14 0743 02/11/14 1203 02/11/14 1623  GLUCAP 145* 191*  137* 152* 227*    No results found for this or any previous visit (from the past 240 hour(s)).   Studies: No results found.  Scheduled Meds: . aspirin EC  325 mg Oral Daily  . digoxin  125 mcg Oral Daily  . enoxaparin (LOVENOX) injection  70 mg Subcutaneous QHS  . folic acid  1 mg Oral Daily  . furosemide  80 mg Intravenous Q12H  . furosemide  80 mg Oral BID  . guaiFENesin  600 mg Oral BID  . insulin aspart  0-9 Units Subcutaneous  TID WC  . lipase/protease/amylase  24,000 Units Oral TID AC  . lisinopril  10 mg Oral Daily  . metoprolol succinate  50 mg Oral Daily  . pantoprazole  40 mg Oral BID  . potassium chloride  40 mEq Oral Daily  . sodium chloride  3 mL Intravenous Q12H  . sodium chloride  3 mL Intravenous Q12H  . thiamine  100 mg Oral Daily   Continuous Infusions:   Principal Problem:   Acute on chronic combined systolic and diastolic CHF (congestive heart failure) Active Problems:   Nonischemic cardiomyopathy   Chronic pancreatitis   Morbid obesity   Chest pain   Chronic atrial fibrillation   Diabetes mellitus type 2, controlled   HTN (hypertension), benign    Time spent: [facility] Pager [phone]. If 7PM-7AM, please contact night-coverage at [email or web address], password [facility] 02/11/2014, 5:05 PM  LOS: 6 days

## 2014-02-12 LAB — GLUCOSE, CAPILLARY: GLUCOSE-CAPILLARY: 159 mg/dL — AB (ref 70–99)

## 2014-02-12 LAB — BASIC METABOLIC PANEL
Anion gap: 12 (ref 5–15)
BUN: 36 mg/dL — AB (ref 6–23)
CHLORIDE: 99 meq/L (ref 96–112)
CO2: 29 meq/L (ref 19–32)
Calcium: 9.4 mg/dL (ref 8.4–10.5)
Creatinine, Ser: 1.04 mg/dL (ref 0.50–1.35)
GFR calc non Af Amer: 77 mL/min — ABNORMAL LOW (ref >90)
GFR, EST AFRICAN AMERICAN: 90 mL/min — AB (ref >90)
GLUCOSE: 153 mg/dL — AB (ref 70–99)
POTASSIUM: 3.6 meq/L — AB (ref 3.7–5.3)
Sodium: 140 mEq/L (ref 137–147)

## 2014-02-12 LAB — PRO B NATRIURETIC PEPTIDE: Pro B Natriuretic peptide (BNP): 386.4 pg/mL — ABNORMAL HIGH (ref 0–125)

## 2014-02-12 MED ORDER — DIGOXIN 125 MCG PO TABS: 125.0000 ug | ORAL_TABLET | ORAL | Status: AC

## 2014-02-12 MED ORDER — FUROSEMIDE 80 MG PO TABS: 80.0000 mg | ORAL_TABLET | Freq: Two times a day (BID) | ORAL | Status: DC

## 2014-02-12 MED ORDER — METOPROLOL SUCCINATE ER 50 MG PO TB24: 50.0000 mg | ORAL_TABLET | ORAL | Status: DC

## 2014-02-12 MED ORDER — LISINOPRIL 10 MG PO TABS: 10.0000 mg | ORAL_TABLET | Freq: Every day | ORAL | Status: DC

## 2014-02-12 MED ORDER — POTASSIUM CHLORIDE CRYS ER 20 MEQ PO TBCR: 40.0000 meq | EXTENDED_RELEASE_TABLET | Freq: Every day | ORAL | Status: DC

## 2014-02-12 NOTE — Progress Notes (Signed)
Attempted to see pt today.  Previous OT stated pt wanted to see AE that could help him bathe and dress himself.  Today, pt stated he is not interested in doing it for himself, he would rather "they" send someone in to assist him with his adls. Pt has cognitive capacity to use AE for LE dressing and be independent with it.  Pt refused all therapy since he stated he was leaving today. Will attempt back as schedule allows. Jinger [place], [place] [phone]

## 2014-02-12 NOTE — Discharge Summary (Signed)
Physician Discharge Summary  Don Brown TAV:[id number] DOB: Jun 03, 1955 DOA: 02/05/2014  PCP: PROVIDER NOT IN SYSTEM  Admit date: 02/05/2014 Discharge date: 02/12/2014  Time spent: 65 minutes  Recommendations for Outpatient Follow-up:  1. Follow-up with PCP at the [place] in 1 week. On follow-up patient will need a basic metabolic profile done to follow-up on electrolytes and renal function. Patient's acute on chronic combined systolic and diastolic CHF/nonischemic cardiomyopathy will need to be followed up upon. Patient will likely benefit from early posthospital CHF follow-up and medication compliance will need to be stressed. 2. Patient is to follow-up with his cardiologist at the [facility] 1-2 weeks. All follow-up patient's acute on chronic CHF will need to be assessed. Patient's metoprolol dose was increased for better rate control in terms of his atrial fibrillation. Patient will need basic metabolic profile done to follow-up on electrolytes and renal function.  Discharge Diagnoses:  Principal Problem:   Acute on chronic combined systolic and diastolic CHF (congestive heart failure) Active Problems:   Nonischemic cardiomyopathy   Chronic pancreatitis   Morbid obesity   Chest pain   Chronic atrial fibrillation   Diabetes mellitus type 2, controlled   HTN (hypertension), benign   Discharge Condition: Stable and improved  Diet recommendation: Heart healthy  Filed Weights   02/10/14 0515 02/11/14 0604 02/12/14 0441  Weight: 136.805 kg (301 lb 9.6 oz) 138.1 kg (304 lb 7.3 oz) 139.6 kg (307 lb 12.2 oz)    History of present illness:  Don Brown is a 58 y.o. 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 Patient stated his symptoms started on  Thursday, 4 days prior to admission with chest pain and gradually patient started developing shortness of breath. Patient did go to the ER at [facility] on Thursday. Patient stated over the weekend patient became very short of breath and had come to the ER today. Patient stated he had been taking his Lasix as advised. Patient's chest pain was retrosternal radiating to his back. Pain improved with morphine. Had occasional nausea, denied any vomiting or abdominal pain, denied any fever chills productive cough or any diarrhea. Chest x-ray in the ER showed congestion compatible with CHF. Patient was given Lasix 60 mg IV and admitted for further management   Hospital Course:  #1 Acute on chronic combined CHF exac/Nonischemic cardiomyopathy Patient had presented with chest pain or shortness of breath which started 4 days prior to admission. Patient did state that he been compliant with his medications. Patient had a chest x-ray done on admission which was consistent with CHF. Pro BNP which was done was elevated at 3632 on admission. Patient was admitted to telemetry. It was felt that patient exacerbation may have been secondary to medical noncompliance. Cardiac enzymes were cycled which were negative 2. 2 d echo from 05/01/13 with mild LVH and EF 20-25% with diffuse hypokinesis. Patient patient was placed on Lasix 40 mg IV every 12 hours. Pro BNP was followed and initially increased to 5583. Repeat chest x-ray which was done was consistent with CHF. Patient was also noted to be volume overloaded on examination with some JVD and diffuse crackles. Patient was also noted to be hypoxic with sats of 70% on room air. CT of the chest which was done was negative for PE or  aortic dissection. Strict I's and O's were done as well as daily weights and patient's weight trended down during the hospitalization. Patient's diuretics were increased to 80 mg IV every 12 hours with improvement with his urinary output however  patient was still volume overloaded. Cardiology consultation was obtained and patient was seen in consultation by Dr. Ron Parker who agreed with continued use of IV Lasix twice daily until patient was euvolemic and then transitioned to oral Lasix. Patient's potassium also needed to be followed. As patient was going to be a greater than 48 hours patient was to be transferred to the [place] for further diuresis. Patient refused transfer and was maintained at [facility]. Patient diuresed well on 80 mg IV every 12 hours. Patient was subsequently transitioned to oral Lasix which she tolerated. Patient be discharged on Lasix 80 mg twice daily in addition to potassium 40 mEq daily. Patient's pro BNP trended down such that by day of discharge patient's pro BNP was 3864 which might be is likely new baseline. Patient's weight on discharge was 139.6 kg. Patient will likely benefit from early posthospital CHF follow-up at the [place]. Patient will be discharged in stable and improved condition.  #2 chronic atrial fibrillation Patient was maintained on metoprolol for rate control during the hospitalization. Per admission H&P patient was told not to be on anticoagulation secondary to procedures in the past. Patient was subsequently maintained on aspirin and will need to follow-up with his PCP as outpatient in terms of anticoagulation for his atrial fibrillation. Patient's metoprolol dose was increased to 50 mg XL daily for better rate control per cardiology recommendations. Patient will need to revisit the issue of anticoagulation as usual care team at the [facility] as his risk for embolic stroke is substantial.  #3 morbid obesity  #4 chest pain May have been likely secondary to problem #1. Cardiac enzymes negative 3. CT of the chest negative for PE or acute aortic dissection. Patient improved clinically with diuresis and had no further chest pain.   #5 type 2 diabetes Hemoglobin A1c from 10/03/2013 was 6.7. Patient was maintained  on a sliding scale insulin throughout this hospitalization.   #6 chronic pancreatitis Stable. Continued on home regimen of Creon.  #7 hypertension Patient's metoprolol and lisinopril were increased for both better rate control and hypertension. Patient's blood pressure improved on this new regimen and patient be discharged home on lisinopril 10 mg daily as well as metoprolol XL 50 mg daily. Outpatient follow up.  Procedures:  CT angio chest 02/06/14  CXR 02/05/14, 02/08/14  Consultations:  Cardiology: Dr Ron Parker 02/09/14  Discharge Exam: Filed Vitals:   02/12/14 0441  BP: 121/54  Pulse: 68  Temp: 97.6 F (36.4 C)  Resp: 18    General: NAD Cardiovascular: IRREGULARLY IRREGULAR Respiratory: CTAB  Discharge Instructions You were cared for by a hospitalist during your hospital stay. If you have any questions about your discharge medications or the care you received while you were in the hospital after you are discharged, you can call the unit and asked to speak with the hospitalist on call if the hospitalist that took care of you is not available. Once you are discharged, your primary care physician will handle any further medical issues. Please note that NO REFILLS for any discharge medications will be authorized once you are discharged, as it is imperative that you return to your primary care physician (or establish a relationship with a primary care physician if you do not have one) for your aftercare  needs so that they can reassess your need for medications and monitor your lab values.  Discharge Instructions    Diet - low sodium heart healthy    Complete by:  As directed      Diet - low sodium heart healthy    Complete by:  As directed      Discharge instructions    Complete by:  As directed   Follow up with PCP at [facility] in 1 week. Follow up with cardiologist at [facility] in 1 week.     Increase activity slowly    Complete by:  As directed           Current Discharge Medication  List    START taking these medications   Details  potassium chloride SA (K-DUR,KLOR-CON) 20 MEQ tablet Take 2 tablets (40 mEq total) by mouth daily. Qty: 62 tablet, Refills: 0      CONTINUE these medications which have CHANGED   Details  digoxin (LANOXIN) 0.125 MG tablet Take 1 tablet (125 mcg total) by mouth every morning. Qty: 31 tablet, Refills: 0    furosemide (LASIX) 80 MG tablet Take 1 tablet (80 mg total) by mouth 2 (two) times daily. Qty: 62 tablet, Refills: 0    lisinopril (PRINIVIL,ZESTRIL) 10 MG tablet Take 1 tablet (10 mg total) by mouth daily. Qty: 30 tablet, Refills: 0    metoprolol succinate (TOPROL-XL) 50 MG 24 hr tablet Take 1 tablet (50 mg total) by mouth every morning. Qty: 30 tablet, Refills: 0      CONTINUE these medications which have NOT CHANGED   Details  acetaminophen (TYLENOL) 325 MG tablet Take 2 tablets (650 mg total) by mouth every 6 (six) hours as needed (for pain). Qty: 90 tablet, Refills: 0    Amylase-Lipase-Protease (PANCREASE PO) Take 2 capsules by mouth 3 (three) times daily with meals.     aspirin 81 MG tablet Take 81 mg by mouth daily before breakfast.     Cyanocobalamin (VITAMIN B 12 PO) Take 2 tablets by mouth daily.     folic acid (FOLVITE) 1 MG tablet Take 1 tablet (1 mg total) by mouth daily. Qty: 30 tablet, Refills: 0    glucose monitoring kit (FREESTYLE) monitoring kit 1 each by Does not apply route 4 (four) times daily - after meals and at bedtime. 1 month Diabetic Testing Supplies for QAC-QHS accuchecks.Any brand OK Qty: 1 each, Refills: 1    Green Tea, Camillia sinensis, (GREEN TEA PO) Take 2 tablets by mouth daily.     insulin aspart (NOVOLOG) 100 UNIT/ML injection Before each meal 3 times a day and then bedtime, 140-199 - 2 units, 200-250 - 4 units, 251-299 - 6 units,  300-349 - 8 units,  350 or above 10 units. Dispense syringes and needles as needed, Ok to switch to PEN if approved. Okay to switch to any approved  short-acting insulin. Qty: 1 vial, Refills: 12    oxyCODONE 10 MG TABS Take 1 tablet (10 mg total) by mouth every 6 (six) hours as needed for severe pain. Qty: 56 tablet, Refills: 0    pantoprazole (PROTONIX) 40 MG tablet Take 1 tablet (40 mg total) by mouth 2 (two) times daily. Qty: 60 tablet, Refills: 0    promethazine (PHENERGAN) 25 MG tablet Take 25 mg by mouth every 8 (eight) hours as needed for nausea or vomiting.    thiamine 100 MG tablet Take 1 tablet (100 mg total) by mouth daily. Qty: 30 tablet, Refills: 0  guaiFENesin (MUCINEX) 600 MG 12 hr tablet Take 1 tablet (600 mg total) by mouth 2 (two) times daily. Qty: 60 tablet, Refills: 0    ibuprofen (ADVIL,MOTRIN) 200 MG tablet Take 400 mg by mouth every 6 (six) hours as needed for headache or moderate pain.       Allergies  Allergen Reactions  . Morphine And Related Nausea And Vomiting   Follow-up Information    Follow up with PROVIDER NOT IN SYSTEM. Schedule an appointment as soon as possible for a visit in 1 week.   Why:  f/u with MD at [place] an appointment as soon as possible for a visit in 1 week to follow up.   Why:  f/u with cardiologist at [facility]       The results of significant diagnostics from this hospitalization (including imaging, microbiology, ancillary and laboratory) are listed below for reference.    Significant Diagnostic Studies: Dg Chest 2 View  02/05/2014   CLINICAL DATA:  Shortness of breath, RIGHT thoracic pain for 3 days worsened with palpation and movement, RIGHT lower quadrant pain, history atrial fibrillation, chronic systolic heart failure, obesity, renal failure, benign essential hypertension, type 2 diabetes  EXAM: CHEST  2 VIEW  COMPARISON:  10/03/2013  FINDINGS: Enlargement of cardiac silhouette with pulmonary vascular congestion.  Despite lighter technique, BILATERAL perihilar infiltrates present likely edema.  Chronic elevation of RIGHT diaphragm.  No gross pleural effusion or  pneumothorax.  IMPRESSION: Enlargement of cardiac silhouette with pulmonary vascular congestion and probable perihilar edema.   Electronically Signed   By: Lavonia Dana M.D.   On: 02/05/2014 21:17   Dg Chest Port 1 View  02/08/2014   CLINICAL DATA:  Heart failure, renal failure, shortness of Breath  EXAM: PORTABLE CHEST - 1 VIEW  COMPARISON:  02/05/2014  FINDINGS: Cardiomegaly again noted. Persistent mild congestion/ edema with slight improvement in aeration. No segmental infiltrate. Degenerative changes thoracic spine.  IMPRESSION: Cardiomegaly. Persistent mild congestion/ pulmonary edema with slight improvement in aeration.   Electronically Signed   By: Lahoma Crocker M.D.   On: 02/08/2014 11:07   Ct Angio Chest Aortic Dissect W &/or W/o  02/06/2014   CLINICAL DATA:  Chest pain and shortness of Breath  EXAM: CT ANGIOGRAPHY CHEST WITH CONTRAST  TECHNIQUE: Multidetector CT imaging of the chest was performed using the standard protocol during bolus administration of intravenous contrast. Multiplanar CT image reconstructions and MIPs were obtained to evaluate the vascular anatomy.  CONTRAST:  100 mL Omnipaque 300  COMPARISON:  Chest x-ray from earlier in the same day.  FINDINGS: The lungs are well aerated bilaterally with mild bibasilar atelectatic changes. A very small left pleural effusion is noted.  The thoracic inlet is within normal limits. The thoracic aorta and its branches are unremarkable. No dissection or aneurysmal dilatation is seen. Limited evaluation of the pulmonary artery shows no gross central pulmonary embolus. No right heart strain is noted. No significant hilar or mediastinal adenopathy is seen.  The visualized upper abdomen reveals cystic change in the right kidney as well as within the liver. Degenerative changes of the thoracic spine are noted.  Review of the MIP images confirms the above findings.  IMPRESSION: No evidence of aortic dissection or pulmonary embolism.  Bibasilar atelectatic  changes are seen.  Small left pleural effusion.   Electronically Signed   By: Inez Catalina M.D.   On: 02/06/2014 01:47    Microbiology: No results found for this or any  previous visit (from the past 240 hour(s)).   Labs: Basic Metabolic Panel:  Recent Labs Lab 02/08/14 0625 02/09/14 0810 02/10/14 0810 02/11/14 0443 02/12/14 0434  NA 141 141 141 140 140  K 4.2 3.5* 3.3* 3.5* 3.6*  CL 98 94* 95* 97 99  CO2 33* 37* 35* 31 29  GLUCOSE 128* 220* 146* 137* 153*  BUN '20 20 23 ' 34* 36*  CREATININE 0.85 0.92 1.01 1.20 1.04  CALCIUM 8.7 9.0 9.4 9.4 9.4  MG  --  1.9  --   --   --    Liver Function Tests: No results for input(s): AST, ALT, ALKPHOS, BILITOT, PROT, ALBUMIN in the last 168 hours.  Recent Labs Lab 02/05/14 1807  LIPASE 9*   No results for input(s): AMMONIA in the last 168 hours. CBC:  Recent Labs Lab 02/07/14 0520 02/08/14 0625 02/09/14 0810 02/10/14 0810 02/11/14 0443  WBC 3.9* 4.9 5.0 4.9 4.3  HGB 13.2 12.6* 13.4 14.9 15.3  HCT 43.8 42.7 44.6 48.0 49.5  MCV 88.3 88.0 89.2 87.9 87.0  PLT 153 166 159 184 140*   Cardiac Enzymes:  Recent Labs Lab 02/05/14 2356 02/06/14 1117  TROPONINI <0.30 <0.30   BNP: BNP (last 3 results)  Recent Labs  02/10/14 0819 02/11/14 0441 02/12/14 0434  PROBNP 829.3* 539.9* 386.4*   CBG:  Recent Labs Lab 02/11/14 0743 02/11/14 1203 02/11/14 1623 02/11/14 2208 02/12/14 0741  GLUCAP 137* 152* 227* 174* 159*       Signed:  Areatha Kalata MD [facility] 02/12/2014, 1:45 PM

## 2014-02-12 NOTE — Progress Notes (Signed)
PT Cancellation Note  Patient Details Name: Don Brown MRN: [id number] DOB: [birth year]   Cancelled Treatment:    Reason Eval/Treat Not Completed: Patient declined, no reason specified. Attemtped PT tx session-pt declined to participate on today. Pt states he is discharging home today and he does not wish to work with PT prior to d/c. Pt states HH is being arranged for him.    Weston Anna, MPT Pager: [phone]

## 2014-02-12 NOTE — Progress Notes (Signed)
Patient Name: Don Brown Date of Encounter: 02/12/2014  Principal Problem:   Acute on chronic combined systolic and diastolic CHF (congestive heart failure) Active Problems:   Nonischemic cardiomyopathy   Chronic pancreatitis   Morbid obesity   Chest pain   Chronic atrial fibrillation   Diabetes mellitus type 2, controlled   HTN (hypertension), benign   Length of Stay: 7  SUBJECTIVE  Net in/out slightly negative on current oral diuretic regimen. AF well rate controlled. K 3.6  CURRENT MEDS . aspirin EC  325 mg Oral Daily  . digoxin  125 mcg Oral Daily  . enoxaparin (LOVENOX) injection  70 mg Subcutaneous QHS  . folic acid  1 mg Oral Daily  . furosemide  80 mg Oral BID  . guaiFENesin  600 mg Oral BID  . insulin aspart  0-9 Units Subcutaneous TID WC  . lipase/protease/amylase  24,000 Units Oral TID AC  . lisinopril  10 mg Oral Daily  . metoprolol succinate  50 mg Oral Daily  . pantoprazole  40 mg Oral BID  . potassium chloride  40 mEq Oral Daily  . sodium chloride  3 mL Intravenous Q12H  . sodium chloride  3 mL Intravenous Q12H  . thiamine  100 mg Oral Daily    OBJECTIVE   Intake/Output Summary (Last 24 hours) at 02/12/14 1158 Last data filed at 02/12/14 0850  Gross per 24 hour  Intake    480 ml  Output   1050 ml  Net   -570 ml   Filed Weights   02/10/14 0515 02/11/14 0604 02/12/14 0441  Weight: 301 lb 9.6 oz (136.805 kg) 304 lb 7.3 oz (138.1 kg) 307 lb 12.2 oz (139.6 kg)    PHYSICAL EXAM Filed Vitals:   02/11/14 1151 02/11/14 1321 02/11/14 2210 02/12/14 0441  BP:  111/66 127/78 121/54  Pulse: 59 78 68 68  Temp:  98.1 F (36.7 C) 97.4 F (36.3 C) 97.6 F (36.4 C)  TempSrc:  Oral Oral Axillary  Resp:  18 18 18   Height:      Weight:    307 lb 12.2 oz (139.6 kg)  SpO2: 99% 99% 100% 100%   General: Alert, oriented x3, no distress. Obesity limits the exam Head: no evidence of trauma, PERRL, EOMI, no exophtalmos or lid lag, no myxedema, no xanthelasma;  normal ears, nose and oropharynx Neck: cannot evaluate jugular venous pulsations and no hepatojugular reflux; brisk carotid pulses without delay and no carotid bruits Chest: clear to auscultation, no signs of consolidation by percussion or palpation, normal fremitus, symmetrical and full respiratory excursions Cardiovascular: laterally displaced apical impulse, irregular rhythm, normal first and second heart sounds, no rubs, cannot hear gallop when sitting up Abdomen: no tenderness or distention, no masses by palpation, no abnormal pulsatility or arterial bruits, normal bowel sounds, no hepatosplenomegaly Extremities: no clubbing, cyanosis or edema; 2+ radial, ulnar and brachial pulses bilaterally; 2+ right femoral, posterior tibial and dorsalis pedis pulses; 2+ left femoral, posterior tibial and dorsalis pedis pulses; no subclavian or femoral bruits Neurological: grossly nonfocal  LABS  CBC  Recent Labs  02/10/14 0810 02/11/14 0443  WBC 4.9 4.3  HGB 14.9 15.3  HCT 48.0 49.5  MCV 87.9 87.0  PLT 184 373*   Basic Metabolic Panel  Recent Labs  02/11/14 0443 02/12/14 0434  NA 140 140  K 3.5* 3.6*  CL 97 99  CO2 31 29  GLUCOSE 137* 153*  BUN 34* 36*  CREATININE 1.20 1.04  CALCIUM 9.4 9.4  Liver Function Tests  Radiology Studies Imaging results have been reviewed and No results found.  TELE AF controlled rate   ASSESSMENT AND PLAN Improved acute exacerbation of chronic systolic/diastolic HF AF with controlled rate Maintaining slight negative fluid balance on current regimen. Stress importance of sodium restriction and daily weight monitoring at home. Call MD if weight increases > 3lb/24h or 5lb/week. Recheck BMET in a couple ofay need more K long term. Again, I would revisit the issue of anticoagulation with his usual care team at the [facility]. Risk of embolic stroke is substantial.  Sanda Klein, MD, [facility] [phone] office [phone]  pager 02/12/2014 11:58 AM

## 2014-02-13 LAB — GLUCOSE, CAPILLARY: Glucose-Capillary: 194 mg/dL — ABNORMAL HIGH (ref 70–99)

## 2014-02-20 HISTORY — PX: BONE MARROW BIOPSY: SHX199

## 2014-02-22 ENCOUNTER — Other Ambulatory Visit: Payer: Self-pay | Admitting: *Deleted

## 2014-03-12 ENCOUNTER — Other Ambulatory Visit: Payer: Self-pay | Admitting: *Deleted

## 2014-03-12 ENCOUNTER — Other Ambulatory Visit (HOSPITAL_COMMUNITY): Payer: Self-pay | Admitting: Internal Medicine

## 2014-05-06 ENCOUNTER — Inpatient Hospital Stay (HOSPITAL_COMMUNITY)
Admission: EM | Admit: 2014-05-06 | Discharge: 2014-05-10 | DRG: 194 | Disposition: A | Discharge status: Home Health | Payer: Medicaid Other | Attending: Internal Medicine | Admitting: Internal Medicine

## 2014-05-06 ENCOUNTER — Emergency Department (HOSPITAL_COMMUNITY): Payer: Medicaid Other

## 2014-05-06 DIAGNOSIS — K861 Other chronic pancreatitis: Secondary | ICD-10-CM | POA: Diagnosis present

## 2014-05-06 DIAGNOSIS — R109 Unspecified abdominal pain: Secondary | ICD-10-CM | POA: Diagnosis present

## 2014-05-06 DIAGNOSIS — A491 Streptococcal infection, unspecified site: Secondary | ICD-10-CM | POA: Diagnosis present

## 2014-05-06 DIAGNOSIS — R001 Bradycardia, unspecified: Secondary | ICD-10-CM

## 2014-05-06 DIAGNOSIS — I10 Essential (primary) hypertension: Secondary | ICD-10-CM | POA: Diagnosis present

## 2014-05-06 DIAGNOSIS — I5022 Chronic systolic (congestive) heart failure: Secondary | ICD-10-CM | POA: Diagnosis present

## 2014-05-06 DIAGNOSIS — E119 Type 2 diabetes mellitus without complications: Secondary | ICD-10-CM | POA: Diagnosis present

## 2014-05-06 DIAGNOSIS — Z6841 Body Mass Index (BMI) 40.0 and over, adult: Secondary | ICD-10-CM

## 2014-05-06 DIAGNOSIS — I482 Chronic atrial fibrillation, unspecified: Secondary | ICD-10-CM

## 2014-05-06 DIAGNOSIS — N179 Acute kidney failure, unspecified: Secondary | ICD-10-CM | POA: Diagnosis present

## 2014-05-06 DIAGNOSIS — D472 Monoclonal gammopathy: Secondary | ICD-10-CM | POA: Diagnosis present

## 2014-05-06 DIAGNOSIS — K439 Ventral hernia without obstruction or gangrene: Secondary | ICD-10-CM | POA: Diagnosis present

## 2014-05-06 DIAGNOSIS — G8929 Other chronic pain: Secondary | ICD-10-CM | POA: Diagnosis present

## 2014-05-06 DIAGNOSIS — J9600 Acute respiratory failure, unspecified whether with hypoxia or hypercapnia: Secondary | ICD-10-CM | POA: Diagnosis present

## 2014-05-06 DIAGNOSIS — Z794 Long term (current) use of insulin: Secondary | ICD-10-CM

## 2014-05-06 DIAGNOSIS — Z9981 Dependence on supplemental oxygen: Secondary | ICD-10-CM | POA: Diagnosis not present

## 2014-05-06 DIAGNOSIS — Z7982 Long term (current) use of aspirin: Secondary | ICD-10-CM

## 2014-05-06 DIAGNOSIS — J80 Acute respiratory distress syndrome: Secondary | ICD-10-CM | POA: Diagnosis present

## 2014-05-06 DIAGNOSIS — E785 Hyperlipidemia, unspecified: Secondary | ICD-10-CM | POA: Diagnosis present

## 2014-05-06 DIAGNOSIS — R1010 Upper abdominal pain, unspecified: Secondary | ICD-10-CM

## 2014-05-06 DIAGNOSIS — J13 Pneumonia due to Streptococcus pneumoniae: Secondary | ICD-10-CM | POA: Diagnosis present

## 2014-05-06 DIAGNOSIS — R0602 Shortness of breath: Secondary | ICD-10-CM | POA: Diagnosis present

## 2014-05-06 DIAGNOSIS — D696 Thrombocytopenia, unspecified: Secondary | ICD-10-CM | POA: Diagnosis present

## 2014-05-06 DIAGNOSIS — R0600 Dyspnea, unspecified: Secondary | ICD-10-CM

## 2014-05-06 DIAGNOSIS — E118 Type 2 diabetes mellitus with unspecified complications: Secondary | ICD-10-CM

## 2014-05-06 DIAGNOSIS — I4891 Unspecified atrial fibrillation: Secondary | ICD-10-CM | POA: Diagnosis present

## 2014-05-06 HISTORY — DX: Sleep apnea, unspecified: G47.30

## 2014-05-06 HISTORY — DX: Personal history of other medical treatment: Z92.89

## 2014-05-06 HISTORY — DX: Multiple myeloma not having achieved remission: C90.00

## 2014-05-06 HISTORY — DX: Dependence on supplemental oxygen: Z99.81

## 2014-05-06 HISTORY — DX: Depression, unspecified: F32.A

## 2014-05-06 HISTORY — DX: Headache, unspecified: R51.9

## 2014-05-06 HISTORY — DX: Major depressive disorder, single episode, unspecified: F32.9

## 2014-05-06 HISTORY — DX: Nonrheumatic mitral (valve) prolapse: I34.1

## 2014-05-06 HISTORY — DX: Anxiety disorder, unspecified: F41.9

## 2014-05-06 HISTORY — DX: Headache: R51

## 2014-05-06 HISTORY — DX: Anemia, unspecified: D64.9

## 2014-05-06 LAB — I-STAT VENOUS BLOOD GAS, ED
Acid-Base Excess: 1 mmol/L (ref 0.0–2.0)
Bicarbonate: 25.2 mEq/L — ABNORMAL HIGH (ref 20.0–24.0)
O2 Saturation: 67 %
PO2 VEN: 35 mmHg (ref 30.0–45.0)
Patient temperature: 99.2
TCO2: 26 mmol/L (ref 0–100)
pCO2, Ven: 40 mmHg — ABNORMAL LOW (ref 45.0–50.0)
pH, Ven: 7.41 — ABNORMAL HIGH (ref 7.250–7.300)

## 2014-05-06 LAB — COMPREHENSIVE METABOLIC PANEL
ALK PHOS: 75 U/L (ref 39–117)
ALT: 16 U/L (ref 0–53)
AST: 22 U/L (ref 0–37)
Albumin: 3.7 g/dL (ref 3.5–5.2)
Anion gap: 5 (ref 5–15)
BUN: 29 mg/dL — ABNORMAL HIGH (ref 6–23)
CALCIUM: 9.4 mg/dL (ref 8.4–10.5)
CO2: 24 mmol/L (ref 19–32)
Chloride: 110 mmol/L (ref 96–112)
Creatinine, Ser: 1.4 mg/dL — ABNORMAL HIGH (ref 0.50–1.35)
GFR, EST AFRICAN AMERICAN: 62 mL/min — AB (ref >90)
GFR, EST NON AFRICAN AMERICAN: 54 mL/min — AB (ref >90)
GLUCOSE: 150 mg/dL — AB (ref 70–99)
POTASSIUM: 4.6 mmol/L (ref 3.5–5.1)
SODIUM: 139 mmol/L (ref 135–145)
Total Bilirubin: 0.8 mg/dL (ref 0.3–1.2)
Total Protein: 8.9 g/dL — ABNORMAL HIGH (ref 6.0–8.3)

## 2014-05-06 LAB — CBC WITH DIFFERENTIAL/PLATELET
Basophils Absolute: 0 10*3/uL (ref 0.0–0.1)
Basophils Relative: 0 % (ref 0–1)
Eosinophils Absolute: 0 10*3/uL (ref 0.0–0.7)
Eosinophils Relative: 1 % (ref 0–5)
HCT: 38.6 % — ABNORMAL LOW (ref 39.0–52.0)
HEMOGLOBIN: 12.2 g/dL — AB (ref 13.0–17.0)
LYMPHS ABS: 0.8 10*3/uL (ref 0.7–4.0)
Lymphocytes Relative: 18 % (ref 12–46)
MCH: 27.9 pg (ref 26.0–34.0)
MCHC: 31.6 g/dL (ref 30.0–36.0)
MCV: 88.1 fL (ref 78.0–100.0)
MONO ABS: 0.3 10*3/uL (ref 0.1–1.0)
MONOS PCT: 7 % (ref 3–12)
NEUTROS PCT: 74 % (ref 43–77)
Neutro Abs: 3.1 10*3/uL (ref 1.7–7.7)
Platelets: 124 10*3/uL — ABNORMAL LOW (ref 150–400)
RBC: 4.38 MIL/uL (ref 4.22–5.81)
RDW: 15.4 % (ref 11.5–15.5)
WBC: 4.1 10*3/uL (ref 4.0–10.5)

## 2014-05-06 LAB — I-STAT CHEM 8, ED
BUN: 33 mg/dL — AB (ref 6–23)
CREATININE: 1.4 mg/dL — AB (ref 0.50–1.35)
Calcium, Ion: 1.09 mmol/L — ABNORMAL LOW (ref 1.12–1.23)
Chloride: 108 mmol/L (ref 96–112)
GLUCOSE: 147 mg/dL — AB (ref 70–99)
HCT: 47 % (ref 39.0–52.0)
HEMOGLOBIN: 16 g/dL (ref 13.0–17.0)
Potassium: 4.7 mmol/L (ref 3.5–5.1)
SODIUM: 144 mmol/L (ref 135–145)
TCO2: 19 mmol/L (ref 0–100)

## 2014-05-06 LAB — I-STAT TROPONIN, ED: Troponin i, poc: 0 ng/mL (ref 0.00–0.08)

## 2014-05-06 LAB — LIPASE, BLOOD: Lipase: 27 U/L (ref 11–59)

## 2014-05-06 LAB — BRAIN NATRIURETIC PEPTIDE: B Natriuretic Peptide: 130.4 pg/mL — ABNORMAL HIGH (ref 0.0–100.0)

## 2014-05-06 LAB — CBG MONITORING, ED: GLUCOSE-CAPILLARY: 137 mg/dL — AB (ref 70–99)

## 2014-05-06 MED ORDER — ONDANSETRON HCL 4 MG/2ML IJ SOLN
4.0000 mg | Freq: Once | INTRAMUSCULAR | Status: AC
  Administered 2014-05-06: 4 mg via INTRAVENOUS
  Filled 2014-05-06: qty 2

## 2014-05-06 MED ORDER — SODIUM CHLORIDE 0.9 % IV BOLUS (SEPSIS)
500.0000 mL | Freq: Once | INTRAVENOUS | Status: AC
  Administered 2014-05-06: 500 mL via INTRAVENOUS

## 2014-05-06 MED ORDER — FENTANYL CITRATE 0.05 MG/ML IJ SOLN
25.0000 ug | Freq: Once | INTRAMUSCULAR | Status: AC
  Administered 2014-05-06: 25 ug via INTRAVENOUS
  Filled 2014-05-06: qty 2

## 2014-05-06 MED ORDER — HYDROMORPHONE HCL 1 MG/ML IJ SOLN
1.0000 mg | Freq: Once | INTRAMUSCULAR | Status: AC
  Administered 2014-05-06: 1 mg via INTRAVENOUS
  Filled 2014-05-06: qty 1

## 2014-05-06 MED ORDER — ALBUTEROL (5 MG/ML) CONTINUOUS INHALATION SOLN
10.0000 mg/h | INHALATION_SOLUTION | RESPIRATORY_TRACT | Status: DC
  Administered 2014-05-06: 10 mg/h via RESPIRATORY_TRACT
  Filled 2014-05-06: qty 20

## 2014-05-06 MED ORDER — FENTANYL CITRATE 0.05 MG/ML IJ SOLN
50.0000 ug | Freq: Once | INTRAMUSCULAR | Status: AC
  Administered 2014-05-06: 50 ug via INTRAVENOUS
  Filled 2014-05-06: qty 2

## 2014-05-06 NOTE — ED Notes (Signed)
Dr. Patel at bedside 

## 2014-05-06 NOTE — H&P (Signed)
[facility] History and Physical  Patient: Don Brown  MRN: [id number]  DOB: 1955-11-10  DOS: the patient was seen and examined on 05/06/2014 PCP: PROVIDER NOT IN SYSTEM  Chief Complaint: Respiratory distress  HPI: Don Brown is a 58 y.o. male with Past medical history of chronic pancreatitis, chronic atrial fibrillation, chronic systolic heart failure, chronic abdominal pain, ventral hernia, essential hypertension, MGUS. Patient called EMS due to progressively worsening blood sugar over last 1 week with readings running high and showing positive ketones. Since the sugar were running high he has been adjusting his insulin as per his [place]. They recommended him to drink more fluids. Reportedly the patient also complains of progressively worsening shortness of breath throughout the day.  He also complains of cough. Denies any fever or chills. He denies any leg tenderness or recent surgery. Patient also complains of abdominal pain which he thinks is secondary to his chronic ventral hernia and chronic pancreatitis but now worsening when he is trying to move. He denies any diarrhea and denies any blood in his stool. He denies any other changes in his medication. He mentions he is supposed to follow-up with his oncologist for his MGUS as there is "something wrong with his kidney". When the patient called EMS they found him short of breath with diminished lung sounds, tired, placed him on CPAP, give him albuterol and Atrovent nebulization. On arrival patient removed C Pap and was placed on 3 L of nasal cannula and was placed on hour-long albuterol nebulization. Patient denies any recent hospitalization or sick contact. Denies any smoking history or alcohol history.  The patient is coming from home. And at his baseline independent for most of his ADL.  Review of Systems: as mentioned in the history of present illness.  A Comprehensive review of the other systems is negative.  Past  Medical History  Diagnosis Date  . 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 Atrial fibrillation   . Chronic systolic heart failure     LVEF 40-45%  . Morbid obesity   . Chronic pain   . Narcotic dependence, episodic use   . Respiratory failure     History of ventilatory-dependent respiratory failure and tracheostomy   . History of renal failure     Requiring hemodialysis in the past with normal renal function at this point  . History of ventral hernia repair   . Cervical compression fracture     Cervical disk disease requiring surgery  . Essential hypertension, benign   . Hyperlipidemia   . Degenerative joint disease   . Heart murmur   . MGUS (monoclonal gammopathy of unknown significance)   . Type 2 diabetes mellitus    Past Surgical History  Procedure Laterality Date  . Ventral hernia repair    . Cholecystectomy    . Tracheostomy    . Pancreas surgery    . Skin graft    . Insertion of mesh     Social History:  reports that he has never smoked. He has never used smokeless tobacco. He reports that he does not drink alcohol or use illicit drugs.  Allergies  Allergen Reactions  . Morphine And Related Nausea And Vomiting    Family History  Problem Relation Age of Onset  . Hypertension Mother   . Diabetes Mother   . Lymphoma Father     Prior to Admission  medications   Medication Sig Start Date End Date Taking? Authorizing Provider  Amylase-Lipase-Protease (PANCREASE PO) Take 2 capsules by mouth 3 (three) times daily with meals.    Yes Historical Provider, MD  aspirin 81 MG tablet Take 81 mg by mouth daily before breakfast.    Yes Historical Provider, MD  Cyanocobalamin (VITAMIN B 12 PO) Take 2 tablets by mouth daily.    Yes Historical Provider, MD  digoxin (LANOXIN) 0.125 MG tablet Take 1 tablet (125 mcg total) by mouth every morning.  02/12/14  Yes Eugenie Filler, MD  folic acid (FOLVITE) 1 MG tablet Take 1 tablet (1 mg total) by mouth daily. 05/17/13  Yes Thurnell Lose, MD  furosemide (LASIX) 80 MG tablet Take 1 tablet (80 mg total) by mouth 2 (two) times daily. Patient taking differently: Take 40 mg by mouth 3 (three) times daily.  02/12/14  Yes Eugenie Filler, MD  Green Tea, Camillia sinensis, (GREEN TEA PO) Take 2 tablets by mouth daily.    Yes Historical Provider, MD  HYDROmorphone (DILAUDID) 2 MG tablet Take 2 mg by mouth every 4 (four) hours as needed for severe pain.   Yes Historical Provider, MD  ibuprofen (ADVIL,MOTRIN) 200 MG tablet Take 400 mg by mouth every 6 (six) hours as needed for headache or moderate pain.   Yes Historical Provider, MD  insulin aspart (NOVOLOG) 100 UNIT/ML injection Before each meal 3 times a day and then bedtime, 140-199 - 2 units, 200-250 - 4 units, 251-299 - 6 units,  300-349 - 8 units,  350 or above 10 units. Dispense syringes and needles as needed, Ok to switch to PEN if approved. Okay to switch to any approved short-acting insulin. 05/17/13  Yes Thurnell Lose, MD  insulin glargine (LANTUS) 100 UNIT/ML injection Inject 16 Units into the skin at bedtime.   Yes Historical Provider, MD  lisinopril (PRINIVIL,ZESTRIL) 10 MG tablet Take 1 tablet (10 mg total) by mouth daily. 02/12/14  Yes Eugenie Filler, MD  metoprolol succinate (TOPROL-XL) 50 MG 24 hr tablet Take 1 tablet (50 mg total) by mouth every morning. 02/12/14  Yes Eugenie Filler, MD  oxyCODONE 10 MG TABS Take 1 tablet (10 mg total) by mouth every 6 (six) hours as needed for severe pain. 10/06/13  Yes Clinton Gallant, MD  pantoprazole (PROTONIX) 40 MG tablet Take 1 tablet (40 mg total) by mouth 2 (two) times daily. 05/17/13  Yes Thurnell Lose, MD  potassium chloride SA (K-DUR,KLOR-CON) 20 MEQ tablet Take 2 tablets (40 mEq total) by mouth daily. 02/12/14  Yes Eugenie Filler, MD  promethazine (PHENERGAN) 25 MG tablet Take 25  mg by mouth every 8 (eight) hours as needed for nausea or vomiting.   Yes Historical Provider, MD  thiamine 100 MG tablet Take 1 tablet (100 mg total) by mouth daily. 05/17/13  Yes Thurnell Lose, MD  Vitamin D, Ergocalciferol, (DRISDOL) 50000 UNITS CAPS capsule Take 50,000 Units by mouth every 7 (seven) days.   Yes Historical Provider, MD  acetaminophen (TYLENOL) 325 MG tablet Take 2 tablets (650 mg total) by mouth every 6 (six) hours as needed (for pain). Patient not taking: Reported on 05/06/2014 10/06/13   Clinton Gallant, MD  glucose monitoring kit (FREESTYLE) monitoring kit 1 each by Does not apply route 4 (four) times daily - after meals and at bedtime. 1 month Diabetic Testing Supplies for QAC-QHS accuchecks.Any brand OK 05/17/13   Thurnell Lose, MD  guaiFENesin (MUCINEX) 600 MG  12 hr tablet Take 1 tablet (600 mg total) by mouth 2 (two) times daily. Patient not taking: Reported on 05/06/2014 10/06/13   Clinton Gallant, MD    Physical Exam: Filed Vitals:   05/06/14 2245 05/06/14 2300 05/06/14 2315 05/06/14 2330  BP: 110/70 124/69 116/46 109/58  Pulse: 59 76 44 54  Temp:      Resp: _0 Height:      Weight:      SpO2: 98% 100% 97% 98%    General: Alert, Awake and Oriented to Time, Place and Person. Appear in mild distress Eyes: PERRL ENT: Oral Mucosa clear moist. Neck: Difficult to assess  JVD Cardiovascular: S1 and S2 Present, no Murmur, Peripheral Pulses Present Respiratory: Bilateral Air entry equal and Decreased, basal Crackles, no wheezes Abdomen: Bowel Sound present, distended, Soft and diffusely tender in upper abdomen, no guarding no rigidity Skin: no Rash Extremities: Trace pedal edema, no calf tenderness Neurologic: Grossly no focal neuro deficit.  Labs on Admission:  CBC:  Recent Labs Lab 05/06/14 1843 05/06/14 2240  WBC  --  4.1  NEUTROABS  --  3.1  HGB 16.0 12.2*  HCT 47.0 38.6*  MCV  --  88.1  PLT  --  124*    CMP     Component Value Date/Time   NA  144 05/06/2014 1843   K 4.7 05/06/2014 1843   CL 108 05/06/2014 1843   CO2 24 05/06/2014 1824   GLUCOSE 147* 05/06/2014 1843   BUN 33* 05/06/2014 1843   CREATININE 1.40* 05/06/2014 1843   CALCIUM 9.4 05/06/2014 1824   PROT 8.9* 05/06/2014 1824   ALBUMIN 3.7 05/06/2014 1824   AST 22 05/06/2014 1824   ALT 16 05/06/2014 1824   ALKPHOS 75 05/06/2014 1824   BILITOT 0.8 05/06/2014 1824   GFRNONAA 54* 05/06/2014 1824   GFRAA 62* 05/06/2014 1824     Recent Labs Lab 05/06/14 1824  LIPASE 27    No results for input(s): CKTOTAL, CKMB, CKMBINDEX, TROPONINI in the last 168 hours. BNP (last 3 results)  Recent Labs  05/06/14 2240  BNP 130.4*    ProBNP (last 3 results)  Recent Labs  02/10/14 0819 02/11/14 0441 02/12/14 0434  PROBNP 829.3* 539.9* 386.4*     Radiological Exams on Admission: Dg Chest Port 1 View  05/06/2014   CLINICAL DATA:  Shortness of Breath  EXAM: PORTABLE CHEST - 1 VIEW  COMPARISON:  February 08, 2014  FINDINGS: There is no edema or consolidation. There is moderate cardiomegaly. Pulmonary vascular is normal. No adenopathy. No bone lesions.  IMPRESSION: Cardiomegaly.  No edema or consolidation.   Electronically Signed   By: Lowella Grip III M.D.   On: 05/06/2014 18:28   EKG: Independently reviewed. atrial fibrillation, nonspecific ST and T waves changes.  Assessment/Plan Principal Problem:   Abdominal pain Active Problems:   Atrial fibrillation   Chronic pancreatitis   Acute respiratory failure   AKI (acute kidney injury)   Morbid obesity   HTN (hypertension), benign   Chronic pain   Type 2 diabetes mellitus   Bradycardia   1. Diabetes mellitus The patient is presenting with complaints of high sugar and ketones. Initial workup found his glucose 150 without any anion gap, a shortness of breath was at his baseline. With this the patient was recommended to be monitored closely. Patient remains nothing by mouth except medications. Holding his  insulin and placing him on sliding scale. Check hemoglobin A1c.  2. acute on  chronic abdominal pain. The patient presents with acute on chronic abdominal pain. Lipase level negative. No significant anion gap on blood work. He mentions his abdominal pain is chronic on my evaluation. With this I would check lactic acid level, recheck his BMP and lipase level and if there is any further abnormality we will obtain further imaging. Currently patient reports he takes 2 mg Dilaudid and 10 mg oxycodone along with Phenergan at home, we will continue with patient's chronic home pain medication. Check alcohol and UDS.  3. acute kidney injury. Patient's discharge serum creatinine 1.04 November 15. Currently serum creatinine mildly elevated. Possible differential included prerenal, cardiorenal, renal or obstructive. Patient mentions he has been urinating more than his frequently to ER physician. Does not have any suprapubic tenderness. Would check BNP, closely monitor his ins and outs. Recheck his weight and if elevated than his baseline discharge weight then recommend IV furosemide. Otherwise continue home furosemide.  4.acute respiratory distress. The patient reportedly called EMS due to shortness of breath. EMS found him lethargic and was placed on Cipro. Patient received albuterol nebulization one hour treatment in the ER patient also received a DuoNeb's. At the time of my evaluation patient has fine basilar crackles and is on his home chronic oxygen. Chest x-ray reveals no acute abnormality. Possible etiology for acute on chronic systolic heart failure, respiratory infection.  Does not appear to have any significant risk factor for PE as it does not have any recent immobilization, Has chronic leg swelling. Does not have any chest pain. Doesn't have any tachycardia. We will check urine antigens and sputum culture. Patient has so far received treatment as possible COPD exacerbation. But his VBG does  not show any significantly elevated PCO2 nor has any acute acidosis. We will closely monitor the patient for any respiratory distress.  5. Bradycardia with A. fib  Patient found to have bradycardia with heart rate ranging in 40s to 50s. He is on digoxin at home. At present we will hold metoprolol and digoxin and check digoxin level. Monitor on telemetry.  6. Chronic thrombocytopenia Continue close to monitor.  DVT Prophylaxis: subcutaneous Heparin Nutrition: Nothing by mouth except medication   Disposition: Admitted to inpatient in telemetry unit. opportunity was given to ask question and all questions were answered at the time of interview.  Author: Berle Mull, MD [facility] Pager: [phone] 05/06/2014, 11:52 PM    If 7PM-7AM, please contact night-coverage [email or web address] Password [email or web address]   Addendum: The patient reportedly refused blood work. On reaching the floor requested IV pain medication and mention his pain was more severe. Patient remains nothing by mouth except medication. Dilaudid changed from oral to IV as needed. Lactic acid treatment in morning.  Patient's urine came positive for Streptococcus pneumoniae. With his initial presentation with shortness of breath and respiratory distress patient was recommended to be treated with ceftriaxone and azithromycin. Patient refused antibiotics at present, as he thinks he does not have any infection, and I offered to the patient that we can monitor him and start him on antibiotics if he again develops any symptoms of respiratory distress.  Patient requested to be transferred to another facility. Patient was informed that overnight transfer would not be feasible. AC was informed to discuss with patient. Patient was given the option to be transferred to [facility] long which he refused. Patient wants to continue with home medication. Patient understood the risk and continues to refuse further workup. He mention he will  think about the transfer in morning after  [facility].  Tisha Cline 5:07 AM 05/07/2014

## 2014-05-06 NOTE — ED Notes (Signed)
Attempted times 1 for another IV without success.

## 2014-05-06 NOTE — ED Notes (Signed)
Pt states sob x 5 days.  Denies any increased edema and states he has been taking his lasix as prescribed (sometimes doubling lasix).  Called EMS for increased sob today.  When EMS arrived O2 sats were 92%.  They noted diminished lung sounds and placed pt on C-pap stating he was altered.  Pt was given 10 albuterol and 0.5 of atrovent.  When pt arrived in ED he was ao x 4.  He ripped of C-pap mask, was placed on 3 L [place], tolerated well.  Pt now on hour long nebx, speaking in complete sentences.

## 2014-05-06 NOTE — ED Provider Notes (Signed)
CSN: [id number]     Arrival date & time 05/06/14  1808 History   First MD Initiated Contact with Patient 05/06/14 1823     Chief Complaint  Patient presents with  . Respiratory Distress     (Consider location/radiation/quality/duration/timing/severity/associated sxs/prior Treatment) HPI Comments: 59 yo AAM with PMHx of HTN, AFib, CHF, chronic pancreatitis, MGUS, HLD who presents with acute onset SOB. Pt endorses a 1-2 week of progressively-worsening blood sugars with recurrent readings of "high" and "+ketones" (pt states his monitor measures ketones). He has been adjusting his insulin per PCP but states his blood sugars continue to run "very high," which is making him urinate more frequently and feel generally weak and tired. More acutely, patient endorses acute onset of progressively worsening SOB throughout the day today, as well as mild epigastric abdominal pain with nausea but no vomiting. Pt states his SOB began with mild cough and has progressively worsened. Denies any known fevers. States his SOB feels similar to his CHF exacerbation but denies any LE edema. Regarding his abdominal pain, it is a constant, gnawing, aching epigastric pain similar to his chronic pancreatitis. No chest pain. No palpitations. No known sick contacts.  The history is provided by the patient.    Past Medical History  Diagnosis Date  . 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 Atrial fibrillation   . Chronic systolic heart failure     LVEF 40-45%  . Morbid obesity   . Chronic pain   . Narcotic dependence, episodic use   . Respiratory failure     History of ventilatory-dependent respiratory failure and tracheostomy   . History of renal failure     Requiring hemodialysis in the past with normal renal function at this point  . History of ventral hernia repair   . Cervical  compression fracture     Cervical disk disease requiring surgery  . Essential hypertension, benign   . Hyperlipidemia   . Degenerative joint disease   . Heart murmur   . MGUS (monoclonal gammopathy of unknown significance)   . Type 2 diabetes mellitus    Past Surgical History  Procedure Laterality Date  . Ventral hernia repair    . Cholecystectomy    . Tracheostomy    . Pancreas surgery    . Skin graft    . Insertion of mesh     Family History  Problem Relation Age of Onset  . Hypertension Mother   . Diabetes Mother   . Lymphoma Father    History  Substance Use Topics  . Smoking status: Never Smoker   . Smokeless tobacco: Never Used  . Alcohol Use: No    Review of Systems  Constitutional: Positive for fatigue. Negative for fever and chills.  HENT: Negative for congestion, rhinorrhea and sore throat.   Eyes: Negative for visual disturbance.  Respiratory: Positive for cough and shortness of breath. Negative for chest tightness and wheezing.   Cardiovascular: Negative for chest pain.  Gastrointestinal: Positive for nausea and abdominal pain. Negative for vomiting and diarrhea.  Endocrine: Positive for polydipsia and polyuria.  Genitourinary: Negative for dysuria and flank pain.  Musculoskeletal: Negative for neck pain.  Skin: Negative for rash.  Allergic/Immunologic: Negative for immunocompromised state.  Neurological: Negative for dizziness, syncope, weakness and headaches.      Allergies  Morphine and related  Home Medications   Prior to Admission  medications   Medication Sig Start Date End Date Taking? Authorizing Provider  acetaminophen (TYLENOL) 325 MG tablet Take 2 tablets (650 mg total) by mouth every 6 (six) hours as needed (for pain). 10/06/13   Clinton Gallant, MD  Amylase-Lipase-Protease (PANCREASE PO) Take 2 capsules by mouth 3 (three) times daily with meals.     Historical Provider, MD  aspirin 81 MG tablet Take 81 mg by mouth daily before breakfast.      Historical Provider, MD  Cyanocobalamin (VITAMIN B 12 PO) Take 2 tablets by mouth daily.     Historical Provider, MD  digoxin (LANOXIN) 0.125 MG tablet Take 1 tablet (125 mcg total) by mouth every morning. 02/12/14   Eugenie Filler, MD  folic acid (FOLVITE) 1 MG tablet Take 1 tablet (1 mg total) by mouth daily. 05/17/13   Thurnell Lose, MD  furosemide (LASIX) 80 MG tablet Take 1 tablet (80 mg total) by mouth 2 (two) times daily. 02/12/14   Eugenie Filler, MD  glucose monitoring kit (FREESTYLE) monitoring kit 1 each by Does not apply route 4 (four) times daily - after meals and at bedtime. 1 month Diabetic Testing Supplies for QAC-QHS accuchecks.Any brand OK 05/17/13   Thurnell Lose, MD  Nyoka Cowden Tea, Camillia sinensis, (GREEN TEA PO) Take 2 tablets by mouth daily.     Historical Provider, MD  guaiFENesin (MUCINEX) 600 MG 12 hr tablet Take 1 tablet (600 mg total) by mouth 2 (two) times daily. 10/06/13   Clinton Gallant, MD  ibuprofen (ADVIL,MOTRIN) 200 MG tablet Take 400 mg by mouth every 6 (six) hours as needed for headache or moderate pain.    Historical Provider, MD  insulin aspart (NOVOLOG) 100 UNIT/ML injection Before each meal 3 times a day and then bedtime, 140-199 - 2 units, 200-250 - 4 units, 251-299 - 6 units,  300-349 - 8 units,  350 or above 10 units. Dispense syringes and needles as needed, Ok to switch to PEN if approved. Okay to switch to any approved short-acting insulin. 05/17/13   Thurnell Lose, MD  lisinopril (PRINIVIL,ZESTRIL) 10 MG tablet Take 1 tablet (10 mg total) by mouth daily. 02/12/14   Eugenie Filler, MD  metoprolol succinate (TOPROL-XL) 50 MG 24 hr tablet Take 1 tablet (50 mg total) by mouth every morning. 02/12/14   Eugenie Filler, MD  oxyCODONE 10 MG TABS Take 1 tablet (10 mg total) by mouth every 6 (six) hours as needed for severe pain. 10/06/13   Clinton Gallant, MD  pantoprazole (PROTONIX) 40 MG tablet Take 1 tablet (40 mg total) by mouth 2 (two) times daily. 05/17/13    Thurnell Lose, MD  potassium chloride SA (K-DUR,KLOR-CON) 20 MEQ tablet Take 2 tablets (40 mEq total) by mouth daily. 02/12/14   Eugenie Filler, MD  promethazine (PHENERGAN) 25 MG tablet Take 25 mg by mouth every 8 (eight) hours as needed for nausea or vomiting.    Historical Provider, MD  thiamine 100 MG tablet Take 1 tablet (100 mg total) by mouth daily. 05/17/13   Thurnell Lose, MD   BP 102/65 mmHg  Pulse 63  Temp(Src) 99.2 F (37.3 C)  Resp 22  Ht 6' (1.829 m)  Wt 315 lb (142.883 kg)  BMI 42.71 kg/m2  SpO2 100% Physical Exam  Constitutional: He is oriented to person, place, and time. He appears well-developed and well-nourished.  Non-toxic appearance. He appears distressed.  HENT:  Head: Normocephalic and atraumatic.  Mouth/Throat: No oropharyngeal  exudate.  Eyes: Conjunctivae are normal. Pupils are equal, round, and reactive to light.  Neck: Normal range of motion. Neck supple.  Cardiovascular: Normal rate and normal heart sounds.  Exam reveals no friction rub.   No murmur heard. Pulmonary/Chest: No accessory muscle usage. Tachypnea noted. No respiratory distress. He has decreased breath sounds. He has rhonchi in the right lower field and the left lower field.  Abdominal: Soft. He exhibits no distension and no mass. There is tenderness (mild, epigastric). There is no rebound and no guarding.  Musculoskeletal: He exhibits no edema.  Neurological: He is alert and oriented to person, place, and time. He has normal strength. No cranial nerve deficit or sensory deficit.  Skin: Skin is warm.  Nursing note reviewed.   ED Course  Procedures (including critical care time) Labs Review Labs Reviewed  CBG MONITORING, ED - Abnormal; Notable for the following:    Glucose-Capillary 137 (*)    All other components within normal limits  I-STAT VENOUS BLOOD GAS, ED - Abnormal; Notable for the following:    pH, Ven 7.410 (*)    pCO2, Ven 40.0 (*)    Bicarbonate 25.2 (*)    All  other components within normal limits  I-STAT CHEM 8, ED - Abnormal; Notable for the following:    BUN 33 (*)    Creatinine, Ser 1.40 (*)    Glucose, Bld 147 (*)    Calcium, Ion 1.09 (*)    All other components within normal limits  BRAIN NATRIURETIC PEPTIDE  COMPREHENSIVE METABOLIC PANEL  LIPASE, BLOOD  I-STAT TROPOININ, ED  CBG MONITORING, ED    Imaging Review Dg Chest Port 1 View  05/06/2014   CLINICAL DATA:  Shortness of Breath  EXAM: PORTABLE CHEST - 1 VIEW  COMPARISON:  February 08, 2014  FINDINGS: There is no edema or consolidation. There is moderate cardiomegaly. Pulmonary vascular is normal. No adenopathy. No bone lesions.  IMPRESSION: Cardiomegaly.  No edema or consolidation.   Electronically Signed   By: Lowella Grip III M.D.   On: 05/06/2014 18:28     EKG Interpretation   Date/Time:  Sunday May 06 2014 18:11:16 EST Ventricular Rate:  54 PR Interval:    QRS Duration: 101 QT Interval:  376 QTC Calculation: 356 R Axis:   18 Text Interpretation:  Atrial fibrillation Low voltage, precordial leads  Abnormal R-wave progression, early transition Borderline T abnormalities,  lateral leads Baseline wander in lead(s) V1 Confirmed by BEATON  MD,  ROBERT ([id number]) on 05/06/2014 7:14:25 PM      MDM   Final diagnoses:  None    59 yo AAM with PMHx of HTN, AFib, CHF, chronic pancreatitis, MGUS, HLD who presents with acute onset worsening SOB in setting of several days to 1-2 weeks of high blood sugars, mildly worsening SOB, and malaise. See HPI above. On arrival, T 99.58F, HR 50-60s in AFib, RR 25, BP 107/52, satting 98% on 3-4L [place]. Exam as above.  Etiology for pt's dyspnea unclear. He does report high blood glucose readings with +ketones on home testing, c/f possible dyspnea 2/2 DKA. Will check stat gas and labs. Pt has been treated for COPD exacerbation but per review of records, no significant h/o COPD - will trial albuterol, check BNP and CXR as c/f CHF exacerbation  more so than COPD. Pt also bradycardic, but this is near his baseline per report but will check EKG. Denies any chest pain at this time. Will also complete infectious work-up for possible precipitating  factors for hyperglycemia and reported ketosis. Pt o/w alert, oriented, protecting his airway. Of note, pt on O2 at home PRN - suspect he may have component of underlying lung disease, but no known COPD. Will hold on steroids while evaluating for DKA. Regarding hsi abdominal pain, he has chronic pancreatitis and he states his pain is similar to this. Will treat with analgesia, send labs. Abdomen is o/w soft without evidence of SBO, peritonitis, or acute intra-abdominal emergency and CT imaging not indicated at this time.   Labs/imaging reviewed as above. CXR without focal PNA, no evidence of overt fluid overload or apparent etiology for SOB. Blood gas with pH 7.4, pCo2 40, not c/w COPD exacerbation and will continue to hold on steroids. Blood gas also not c/w DKA and CO2 normal, with no gap, making DKA unlikely. CMP with mild AKI, BUN 29, Cr 1.40 and gentle fluids have been given. Lipase normal, though pt has h/o normal lipase during chronic pancreatitis exacerbations (he was elevated on his last admission, however). Troponin negative. EKG shows AFib, no acute ischemia. CBC with no leukocytosis. Hgb 12.2, pt has known anemia.  Given persistent subjective SOB, abdominal pain, and O2 requirement, will admit for further work-up of likely multifactorial dyspnea. Pt in agreement with this plan.  Clinical Impression: 1. Dyspnea   2. Type 2 diabetes mellitus with complication   3. Bradycardia   4. Chronic atrial fibrillation   5. Chronic pancreatitis, unspecified pancreatitis type     Disposition: Admit  Condition: Good  Pt seen in conjunction with Dr. Marlynn Perking, MD 05/07/14 1419  Dot Lanes, MD 05/08/14 1248

## 2014-05-06 NOTE — ED Notes (Signed)
Dr Audie Pinto given a copy of venous blood gas.

## 2014-05-07 ENCOUNTER — Encounter (HOSPITAL_COMMUNITY): Payer: Self-pay | Admitting: General Practice

## 2014-05-07 DIAGNOSIS — E119 Type 2 diabetes mellitus without complications: Secondary | ICD-10-CM

## 2014-05-07 DIAGNOSIS — A491 Streptococcal infection, unspecified site: Secondary | ICD-10-CM | POA: Diagnosis present

## 2014-05-07 DIAGNOSIS — R001 Bradycardia, unspecified: Secondary | ICD-10-CM | POA: Diagnosis present

## 2014-05-07 DIAGNOSIS — G8929 Other chronic pain: Secondary | ICD-10-CM | POA: Diagnosis present

## 2014-05-07 DIAGNOSIS — J9601 Acute respiratory failure with hypoxia: Secondary | ICD-10-CM

## 2014-05-07 LAB — BASIC METABOLIC PANEL
ANION GAP: 5 (ref 5–15)
BUN: 29 mg/dL — ABNORMAL HIGH (ref 6–23)
CHLORIDE: 108 mmol/L (ref 96–112)
CO2: 26 mmol/L (ref 19–32)
CREATININE: 1.32 mg/dL (ref 0.50–1.35)
Calcium: 8.9 mg/dL (ref 8.4–10.5)
GFR calc Af Amer: 67 mL/min — ABNORMAL LOW (ref >90)
GFR, EST NON AFRICAN AMERICAN: 57 mL/min — AB (ref >90)
Glucose, Bld: 172 mg/dL — ABNORMAL HIGH (ref 70–99)
POTASSIUM: 4.4 mmol/L (ref 3.5–5.1)
Sodium: 139 mmol/L (ref 135–145)

## 2014-05-07 LAB — GLUCOSE, CAPILLARY
GLUCOSE-CAPILLARY: 149 mg/dL — AB (ref 70–99)
GLUCOSE-CAPILLARY: 158 mg/dL — AB (ref 70–99)
Glucose-Capillary: 131 mg/dL — ABNORMAL HIGH (ref 70–99)
Glucose-Capillary: 251 mg/dL — ABNORMAL HIGH (ref 70–99)

## 2014-05-07 LAB — RAPID URINE DRUG SCREEN, HOSP PERFORMED
AMPHETAMINES: NOT DETECTED
BENZODIAZEPINES: NOT DETECTED
Barbiturates: NOT DETECTED
COCAINE: NOT DETECTED
OPIATES: POSITIVE — AB
Tetrahydrocannabinol: NOT DETECTED

## 2014-05-07 LAB — ETHANOL: Alcohol, Ethyl (B): <5 mg/dL (ref 0–9)

## 2014-05-07 LAB — TSH: TSH: 0.955 u[IU]/mL (ref 0.350–4.500)

## 2014-05-07 LAB — STREP PNEUMONIAE URINARY ANTIGEN: Strep Pneumo Urinary Antigen: POSITIVE — AB

## 2014-05-07 LAB — LIPASE, BLOOD: Lipase: 23 U/L (ref 11–59)

## 2014-05-07 LAB — TROPONIN I: Troponin I: <0.03 ng/mL (ref <0.031)

## 2014-05-07 MED ORDER — CEFTRIAXONE SODIUM IN DEXTROSE 20 MG/ML IV SOLN
1.0000 g | INTRAVENOUS | Status: DC
  Administered 2014-05-08 – 2014-05-09 (×2): 1 g via INTRAVENOUS
  Filled 2014-05-07 (×3): qty 50

## 2014-05-07 MED ORDER — HYDROMORPHONE HCL 1 MG/ML IJ SOLN
0.5000 mg | INTRAMUSCULAR | Status: DC | PRN
  Administered 2014-05-07 – 2014-05-10 (×20): 1 mg via INTRAVENOUS
  Filled 2014-05-07 (×20): qty 1

## 2014-05-07 MED ORDER — SODIUM CHLORIDE 0.9 % IJ SOLN
3.0000 mL | Freq: Two times a day (BID) | INTRAMUSCULAR | Status: DC
  Administered 2014-05-07 – 2014-05-10 (×4): 3 mL via INTRAVENOUS

## 2014-05-07 MED ORDER — MORPHINE SULFATE 2 MG/ML IJ SOLN: 2.0000 mg | INTRAMUSCULAR | Status: DC | PRN

## 2014-05-07 MED ORDER — ONDANSETRON HCL 4 MG/2ML IJ SOLN
4.0000 mg | Freq: Four times a day (QID) | INTRAMUSCULAR | Status: DC | PRN
  Administered 2014-05-07 – 2014-05-09 (×7): 4 mg via INTRAVENOUS
  Filled 2014-05-07 (×8): qty 2

## 2014-05-07 MED ORDER — LISINOPRIL 10 MG PO TABS
10.0000 mg | ORAL_TABLET | Freq: Every day | ORAL | Status: DC
  Administered 2014-05-07 – 2014-05-10 (×4): 10 mg via ORAL
  Filled 2014-05-07 (×4): qty 1

## 2014-05-07 MED ORDER — ONDANSETRON HCL 4 MG PO TABS: 4.0000 mg | ORAL_TABLET | Freq: Four times a day (QID) | ORAL | Status: DC | PRN

## 2014-05-07 MED ORDER — INSULIN ASPART 100 UNIT/ML ~~LOC~~ SOLN: 0.0000 [IU] | Freq: Four times a day (QID) | SUBCUTANEOUS | Status: DC

## 2014-05-07 MED ORDER — DEXTROSE 5 % IV SOLN
500.0000 mg | INTRAVENOUS | Status: DC
  Filled 2014-05-07: qty 500

## 2014-05-07 MED ORDER — HEPARIN SODIUM (PORCINE) 5000 UNIT/ML IJ SOLN
5000.0000 [IU] | Freq: Three times a day (TID) | INTRAMUSCULAR | Status: DC
  Filled 2014-05-07 (×13): qty 1

## 2014-05-07 MED ORDER — INSULIN ASPART 100 UNIT/ML ~~LOC~~ SOLN
0.0000 [IU] | Freq: Three times a day (TID) | SUBCUTANEOUS | Status: DC
  Administered 2014-05-07: 8 [IU] via SUBCUTANEOUS
  Administered 2014-05-07 – 2014-05-08 (×3): 3 [IU] via SUBCUTANEOUS
  Administered 2014-05-08: 5 [IU] via SUBCUTANEOUS
  Administered 2014-05-09 (×2): 3 [IU] via SUBCUTANEOUS
  Administered 2014-05-09 – 2014-05-10 (×2): 2 [IU] via SUBCUTANEOUS
  Administered 2014-05-10: 3 [IU] via SUBCUTANEOUS

## 2014-05-07 MED ORDER — PANCRELIPASE (LIP-PROT-AMYL) 12000-38000 UNITS PO CPEP
24000.0000 [IU] | ORAL_CAPSULE | Freq: Three times a day (TID) | ORAL | Status: DC
  Administered 2014-05-07 – 2014-05-10 (×11): 24000 [IU] via ORAL
  Filled 2014-05-07 (×13): qty 2

## 2014-05-07 MED ORDER — ASPIRIN 81 MG PO CHEW
81.0000 mg | CHEWABLE_TABLET | Freq: Every day | ORAL | Status: DC
  Administered 2014-05-07 – 2014-05-10 (×4): 81 mg via ORAL
  Filled 2014-05-07 (×5): qty 1

## 2014-05-07 MED ORDER — FUROSEMIDE 40 MG PO TABS
40.0000 mg | ORAL_TABLET | Freq: Three times a day (TID) | ORAL | Status: DC
  Administered 2014-05-07 – 2014-05-08 (×5): 40 mg via ORAL
  Filled 2014-05-07 (×7): qty 1

## 2014-05-07 MED ORDER — PROMETHAZINE HCL 25 MG PO TABS
25.0000 mg | ORAL_TABLET | Freq: Three times a day (TID) | ORAL | Status: DC | PRN
  Filled 2014-05-07: qty 1

## 2014-05-07 MED ORDER — CEFTRIAXONE SODIUM IN DEXTROSE 20 MG/ML IV SOLN
1.0000 g | INTRAVENOUS | Status: DC
  Filled 2014-05-07: qty 50

## 2014-05-07 MED ORDER — OXYCODONE HCL 5 MG PO TABS
10.0000 mg | ORAL_TABLET | Freq: Four times a day (QID) | ORAL | Status: DC | PRN
  Administered 2014-05-07 – 2014-05-10 (×7): 10 mg via ORAL
  Filled 2014-05-07 (×7): qty 2

## 2014-05-07 MED ORDER — VITAMIN B-1 100 MG PO TABS
100.0000 mg | ORAL_TABLET | Freq: Every day | ORAL | Status: DC
  Administered 2014-05-07 – 2014-05-10 (×4): 100 mg via ORAL
  Filled 2014-05-07 (×4): qty 1

## 2014-05-07 MED ORDER — PANTOPRAZOLE SODIUM 40 MG PO TBEC
40.0000 mg | DELAYED_RELEASE_TABLET | Freq: Two times a day (BID) | ORAL | Status: DC
  Administered 2014-05-07 – 2014-05-10 (×7): 40 mg via ORAL
  Filled 2014-05-07 (×7): qty 1

## 2014-05-07 MED ORDER — FOLIC ACID 1 MG PO TABS
1.0000 mg | ORAL_TABLET | Freq: Every day | ORAL | Status: DC
  Administered 2014-05-07 – 2014-05-10 (×4): 1 mg via ORAL
  Filled 2014-05-07 (×4): qty 1

## 2014-05-07 MED ORDER — ACETAMINOPHEN 650 MG RE SUPP: 650.0000 mg | Freq: Four times a day (QID) | RECTAL | Status: DC | PRN

## 2014-05-07 MED ORDER — HYDROMORPHONE HCL 2 MG PO TABS
2.0000 mg | ORAL_TABLET | ORAL | Status: DC | PRN
  Filled 2014-05-07: qty 1

## 2014-05-07 MED ORDER — ACETAMINOPHEN 325 MG PO TABS
650.0000 mg | ORAL_TABLET | Freq: Four times a day (QID) | ORAL | Status: DC | PRN
  Administered 2014-05-09: 650 mg via ORAL
  Filled 2014-05-07: qty 2

## 2014-05-07 NOTE — Progress Notes (Addendum)
[facility] PROGRESS NOTE  Don Brown WOE:[id number] DOB: 1955/09/04 DOA: 05/06/2014 PCP: PROVIDER NOT IN SYSTEM  Summary Don Brown is a 59 y.o. male with past medical history of chronic pancreatitis, chronic atrial fibrillation, chronic systolic heart failure EF 25%, chronic abdominal pain, ventral hernia, essential hypertension, MGUS who called EMS due to progressively worsening blood sugar over last 1 week with readings running high and showing positive ketones. Apparently, patient was dyspneic on the way, requiring BiPAP support and his sugars were not terribly high in the ED with no evidence of ketoacidosis. However, his urine was positive for Streptococcus and he was started on treatment for suspected pneumonia, with Ceftriaxone/Zithromax. Hemoglobin A1c is pending. Patient is very unhappy today and he is complaining that EMS put BiPAP on him when he did not need it, that caregivers in the emergency room did not give him complete information about medications being given and that phlebetomists were also giving him a hard time. I have not been able to get a consistent story during my encounter with him as he is fixated on what didn't go right from the time he left his house. I have tried to have him focus on what we need to work on going forward and  assured him that I will ask for the patient advocate to speak with him regarding the several complains he has. I terminated the encounter with promise to have patient advocate contacted. Meanwhile, I told patient we would continue with antibiotics and sliding scale insulin as we are doing. I will also order a diet for him. Plan Streptococcal infection/Acute respiratory failure  Continue ceftriaxone/azithromycin to complete 7 days of antibiotics. AKI (acute kidney injury)  Likely related to ongoing infection.  Improved  Monitor  Atrial fibrillation/chronic systolic CHF EF 00%/[id number]/IHW (hypertension), benign  Continue home  medications Chronic pancreatitis/Abdominal pain/Chronic pain  Pain meds as needed  Start feeding Morbid obesity/Type 2 diabetes mellitus  Follow hemoglobin A1c  SSI Code Status: Full code Family Communication: No family at bedside Disposition Plan: Eventually home   Consultants:  None  Procedures:  None  Antibiotics:  Ceftriaxone 05/06/2014>  Azithromycin 05/06/2014>  HPI/Subjective: Has multiple complaints.  Objective: Filed Vitals:   05/07/14 0910  BP: 121/63  Pulse:   Temp:   Resp:     Intake/Output Summary (Last 24 hours) at 05/07/14 1056 Last data filed at 05/07/14 0930  Gross per 24 hour  Intake    500 ml  Output    325 ml  Net    175 ml   Filed Weights   05/06/14 1850 05/07/14 0115 05/07/14 0534  Weight: 142.883 kg (315 lb) 144.244 kg (318 lb) 145 kg (319 lb 10.7 oz)    Exam:   Could not complete exam due to lack of patient corporation but he seems to be comfortable and not in respiratory distress. Data Reviewed: Basic Metabolic Panel:  Recent Labs Lab 05/06/14 1824 05/06/14 1843 05/06/14 2240  NA 139 144 139  K 4.6 4.7 4.4  CL 110 108 108  CO2 24  --  26  GLUCOSE 150* 147* 172*  BUN 29* 33* 29*  CREATININE 1.40* 1.40* 1.32  CALCIUM 9.4  --  8.9   Liver Function Tests:  Recent Labs Lab 05/06/14 1824  AST 22  ALT 16  ALKPHOS 75  BILITOT 0.8  PROT 8.9*  ALBUMIN 3.7    Recent Labs Lab 05/06/14 1824 05/06/14 2240  LIPASE 27 23   No results for input(s): AMMONIA in the  last 168 hours. CBC:  Recent Labs Lab 05/06/14 1843 05/06/14 2240  WBC  --  4.1  NEUTROABS  --  3.1  HGB 16.0 12.2*  HCT 47.0 38.6*  MCV  --  88.1  PLT  --  124*   Cardiac Enzymes:  Recent Labs Lab 05/06/14 2240  TROPONINI <0.03   BNP (last 3 results)  Recent Labs  05/06/14 2240  BNP 130.4*    ProBNP (last 3 results)  Recent Labs  02/10/14 0819 02/11/14 0441 02/12/14 0434  PROBNP 829.3* 539.9* 386.4*    CBG:  Recent  Labs Lab 05/06/14 1814 05/07/14 0127 05/07/14 0649  GLUCAP 137* 149* 131*    No results found for this or any previous visit (from the past 240 hour(s)).   Studies: Dg Chest Port 1 View  05/06/2014   CLINICAL DATA:  Shortness of Breath  EXAM: PORTABLE CHEST - 1 VIEW  COMPARISON:  February 08, 2014  FINDINGS: There is no edema or consolidation. There is moderate cardiomegaly. Pulmonary vascular is normal. No adenopathy. No bone lesions.  IMPRESSION: Cardiomegaly.  No edema or consolidation.   Electronically Signed   By: Lowella Grip III M.D.   On: 05/06/2014 18:28    Scheduled Meds: . aspirin  81 mg Oral QAC breakfast  . azithromycin  500 mg Intravenous Q24H  . cefTRIAXone (ROCEPHIN)  IV  1 g Intravenous Q24H  . folic acid  1 mg Oral Daily  . furosemide  40 mg Oral TID WC  . heparin  5,000 Units Subcutaneous 3 times per day  . insulin aspart  0-15 Units Subcutaneous Q6H  . lipase/protease/amylase  24,000 Units Oral TID AC  . lisinopril  10 mg Oral Daily  . pantoprazole  40 mg Oral BID  . sodium chloride  3 mL Intravenous Q12H  . thiamine  100 mg Oral Daily   Continuous Infusions: . albuterol Stopped (05/06/14 1955)     Time spent: 30 minutes    Don Brown  [facility] Pager [phone]. If 7PM-7AM, please contact night-coverage at [email or web address], password [facility] 05/07/2014, 10:56 AM  LOS: 1 day

## 2014-05-07 NOTE — Progress Notes (Signed)
Pt refusing all lab draws and care. For protection of care workers/staff..the patient has been put on droplet until flu has been ruled out. Pt states, "if I have some contagious disease...just get out of my room". Dr Posey Pronto informed and at the bedside currently.

## 2014-05-07 NOTE — Progress Notes (Signed)
Pt refused to answer any of the history questions/admission questions or allow RN to do an assessment. Pt yelled at the RN, "to get the hell of of his room". RN informed Agricultural consultant of the situation.

## 2014-05-07 NOTE — Progress Notes (Signed)
Patient refusing all lab work at this time. Dr. Posey Pronto notified.

## 2014-05-07 NOTE — Progress Notes (Signed)
Pt arrived on the floor aggressive and hateful to caretakers, pt was demanding pain meds and refusing ordered labs. Lab was called and able to add on all labs except the lactic acid. Dr Posey Pronto informed.

## 2014-05-07 NOTE — Progress Notes (Signed)
Patient refusing to have bed alarm in place and constantly getting out of bed without calling nursing staff for assistance. Pt states "If I fall, I'm going to sue the hospital!" Patient refusing to wear yellow socks, yellow armband and refusing to call nursing staff before getting up. Patient educated on importance of calling nursing staff for assistance and importance of implementing fall risk protocols. Patient still refusing all fall risk implementation at this time. Will continue to monitor

## 2014-05-07 NOTE — Progress Notes (Signed)
Pt refused to allow phlebotomist to complete lab draws. ALC, Lactic Acid, and A1C could not be drawn.

## 2014-05-08 LAB — HEMOGLOBIN A1C
Hgb A1c MFr Bld: 9.7 % — ABNORMAL HIGH (ref 4.8–5.6)
MEAN PLASMA GLUCOSE: 232 mg/dL

## 2014-05-08 LAB — LEGIONELLA ANTIGEN, URINE

## 2014-05-08 LAB — COMPREHENSIVE METABOLIC PANEL
ALK PHOS: 56 U/L (ref 39–117)
ALT: 14 U/L (ref 0–53)
AST: 16 U/L (ref 0–37)
Albumin: 3.3 g/dL — ABNORMAL LOW (ref 3.5–5.2)
Anion gap: 7 (ref 5–15)
BILIRUBIN TOTAL: 0.8 mg/dL (ref 0.3–1.2)
BUN: 27 mg/dL — ABNORMAL HIGH (ref 6–23)
CHLORIDE: 105 mmol/L (ref 96–112)
CO2: 27 mmol/L (ref 19–32)
CREATININE: 1.34 mg/dL (ref 0.50–1.35)
Calcium: 8.7 mg/dL (ref 8.4–10.5)
GFR calc Af Amer: 65 mL/min — ABNORMAL LOW (ref >90)
GFR, EST NON AFRICAN AMERICAN: 56 mL/min — AB (ref >90)
Glucose, Bld: 143 mg/dL — ABNORMAL HIGH (ref 70–99)
POTASSIUM: 4.3 mmol/L (ref 3.5–5.1)
Sodium: 139 mmol/L (ref 135–145)
Total Protein: 8.4 g/dL — ABNORMAL HIGH (ref 6.0–8.3)

## 2014-05-08 LAB — GLUCOSE, CAPILLARY
GLUCOSE-CAPILLARY: 155 mg/dL — AB (ref 70–99)
GLUCOSE-CAPILLARY: 171 mg/dL — AB (ref 70–99)
Glucose-Capillary: 179 mg/dL — ABNORMAL HIGH (ref 70–99)
Glucose-Capillary: 167 mg/dL — ABNORMAL HIGH (ref 70–99)
Glucose-Capillary: 236 mg/dL — ABNORMAL HIGH (ref 70–99)

## 2014-05-08 MED ORDER — VITAMIN B 12 100 MCG PO LOZG: LOZENGE | Freq: Every day | ORAL | Status: DC

## 2014-05-08 MED ORDER — METOPROLOL SUCCINATE ER 50 MG PO TB24
50.0000 mg | ORAL_TABLET | ORAL | Status: DC
  Administered 2014-05-09 – 2014-05-10 (×2): 50 mg via ORAL
  Filled 2014-05-08 (×3): qty 1

## 2014-05-08 MED ORDER — POTASSIUM CHLORIDE CRYS ER 20 MEQ PO TBCR
20.0000 meq | EXTENDED_RELEASE_TABLET | Freq: Every day | ORAL | Status: DC
  Administered 2014-05-08 – 2014-05-10 (×3): 20 meq via ORAL
  Filled 2014-05-08 (×4): qty 1

## 2014-05-08 MED ORDER — INSULIN GLARGINE 100 UNIT/ML ~~LOC~~ SOLN
16.0000 [IU] | Freq: Every day | SUBCUTANEOUS | Status: DC
  Administered 2014-05-08: 10 [IU] via SUBCUTANEOUS
  Filled 2014-05-08 (×3): qty 0.16

## 2014-05-08 MED ORDER — DIGOXIN 125 MCG PO TABS
125.0000 ug | ORAL_TABLET | ORAL | Status: DC
  Administered 2014-05-09 – 2014-05-10 (×2): 125 ug via ORAL
  Filled 2014-05-08 (×3): qty 1

## 2014-05-08 MED ORDER — VITAMIN B-12 1000 MCG PO TABS
1000.0000 ug | ORAL_TABLET | Freq: Every day | ORAL | Status: DC
  Administered 2014-05-08 – 2014-05-10 (×3): 1000 ug via ORAL
  Filled 2014-05-08 (×3): qty 1

## 2014-05-08 MED ORDER — GUAIFENESIN ER 600 MG PO TB12
600.0000 mg | ORAL_TABLET | Freq: Two times a day (BID) | ORAL | Status: DC
  Administered 2014-05-10: 600 mg via ORAL
  Filled 2014-05-08 (×5): qty 1

## 2014-05-08 MED ORDER — FUROSEMIDE 40 MG PO TABS
40.0000 mg | ORAL_TABLET | Freq: Two times a day (BID) | ORAL | Status: DC
  Administered 2014-05-08 – 2014-05-10 (×4): 40 mg via ORAL
  Filled 2014-05-08 (×6): qty 1

## 2014-05-08 NOTE — Progress Notes (Signed)
UR Completed.  [phone]  

## 2014-05-08 NOTE — Care Management Note (Signed)
    Page 1 of 2   05/09/2014     12:27:16 PM CARE MANAGEMENT NOTE 05/09/2014  Patient:  Don Brown   Account Number:  [id number]  Date Initiated:  05/08/2014  Documentation initiated by:  Tomi Bamberger  Subjective/Objective Assessment:   dx resp distress, abd pain  admit- from home  Patient is active with [facility] for [facility] and [place]..     Action/Plan:   Anticipated DC Date:  05/09/2014   Anticipated DC Plan:  [place]  CM consult      [facility] Choice  HOME HEALTH  Resumption Of Svcs/PTA Provider   Choice offered to / List presented to:  C-1 Patient        [place] arranged  HH-1 RN      [facility] agency  [place]   Status of service:  Completed, signed off Medicare Important Message given?  NO (If response is "NO", the following Medicare IM given date fields will be blank) Date Medicare IM given:   Medicare IM given by:   Date Additional Medicare IM given:   Additional Medicare IM given by:    Discharge Disposition:  [place]  Per UR Regulation:  Reviewed for med. necessity/level of care/duration of stay  If discussed at [place] of Stay Meetings, dates discussed:    Comments:  05/09/14 [name], BSN [phone] patient is for dc today, he states he wants a shower chair, NCM [facility] [phone] spoke with Debbie informed her I need a consult to Dr. Darlina Sicilian for ot for a shower chair for patient, she states they will get this message to patient's PCP and ot will be contacting patient at home to deliver to him.  NCM also informed Bethena Roys with [facility] that patient is ready for dc today and would like to continue with [facility] only not PT.  05/08/14 [name], BSN [phone] patient is from home, per [facility] with [facility], patient is active with them for [facility] and [place].  NCM spoke with patient on 05/07/14 regarding information concerning that he wants to go to the [facility].   Patient states he did not say he wanted to go to [facility], NCM asked if patient wants to stay here he states  yes he just wants to be treated right. He would like to get his pain meds when he wants his pain meds.

## 2014-05-08 NOTE — Progress Notes (Signed)
[facility] PROGRESS NOTE  Don Brown VHQ:[id number] DOB: 05/23/1955 DOA: 05/06/2014 PCP: PROVIDER NOT IN SYSTEM  Summary Don Brown is a 59 y.o. male with past medical history of chronic pancreatitis, chronic atrial fibrillation, chronic systolic heart failure EF 25%, chronic abdominal pain, ventral hernia, essential hypertension, MGUS who called EMS due to progressively worsening blood sugar over last 1 week with readings running high and showing positive ketones. Apparently, patient was dyspneic on the way, requiring BiPAP support and his sugars were not terribly high in the ED with no evidence of ketoacidosis. However, his urine was positive for Streptococcus and he was started on treatment for suspected pneumonia, with Ceftriaxone/Zithromax(Zithromax discontinued on 05/07/14 as Ceftriaxone sufficient for strep). Hemoglobin A1c is 9.7. Sugars have remained generally controlled. He complains of some shortness of breath today. Will resume most of his home medications as patient is now taking oral. Hopefully, he can discharge in the next 24-48 hours if he continues to do well. Plan Streptococcal infection/Acute respiratory failure  Continue ceftriaxone to complete 7 days of antibiotics. AKI (acute kidney injury)  Likely related to ongoing infection.  Improved  Monitor Atrial fibrillation/chronic systolic CHF EF 41%/[id number]/DGU (hypertension), benign  Continue home medications Chronic pancreatitis/Abdominal pain/Chronic pain  Pain meds as needed Morbid obesity/Type 2 diabetes mellitus  Hemoglobin A1c 9.7. He will need tighter control long term.  SSI Code Status: Full code Family Communication: No family at bedside Disposition Plan: Eventually home   Consultants:  None  Procedures:  None  Antibiotics:  Ceftriaxone 05/06/2014>  Azithromycin 05/06/2014>05/07/14.  Objective: Filed Vitals:   05/08/14 0947  BP: 107/72  Pulse:   Temp:   Resp:      Intake/Output Summary (Last 24 hours) at 05/08/14 1718 Last data filed at 05/08/14 1457  Gross per 24 hour  Intake    480 ml  Output   1300 ml  Net   -820 ml   Filed Weights   05/07/14 0115 05/07/14 0534 05/08/14 0605  Weight: 144.244 kg (318 lb) 145 kg (319 lb 10.7 oz) 142.6 kg (314 lb 6 oz)    Exam:   General:  Comfortable at rest.  Cardiovascular: S1-S2 normal. No murmurs. Pulse regular.  Respiratory: Good air entry bilaterally. No rhonchi or rales.  Abdomen: Soft and nontender. Normal bowel sounds. No organomegaly.  Musculoskeletal: No pedal edema   Neurological: Intact  Data Reviewed: Basic Metabolic Panel:  Recent Labs Lab 05/06/14 1824 05/06/14 1843 05/06/14 2240 05/08/14 0615  NA 139 144 139 139  K 4.6 4.7 4.4 4.3  CL 110 108 108 105  CO2 24  --  26 27  GLUCOSE 150* 147* 172* 143*  BUN 29* 33* 29* 27*  CREATININE 1.40* 1.40* 1.32 1.34  CALCIUM 9.4  --  8.9 8.7   Liver Function Tests:  Recent Labs Lab 05/06/14 1824 05/08/14 0615  AST 22 16  ALT 16 14  ALKPHOS 75 56  BILITOT 0.8 0.8  PROT 8.9* 8.4*  ALBUMIN 3.7 3.3*    Recent Labs Lab 05/06/14 1824 05/06/14 2240  LIPASE 27 23   No results for input(s): AMMONIA in the last 168 hours. CBC:  Recent Labs Lab 05/06/14 1843 05/06/14 2240  WBC  --  4.1  NEUTROABS  --  3.1  HGB 16.0 12.2*  HCT 47.0 38.6*  MCV  --  88.1  PLT  --  124*   Cardiac Enzymes:  Recent Labs Lab 05/06/14 2240  TROPONINI <0.03   BNP (last 3 results)  Recent  Labs  05/06/14 2240  BNP 130.4*    ProBNP (last 3 results)  Recent Labs  02/10/14 0819 02/11/14 0441 02/12/14 0434  PROBNP 829.3* 539.9* 386.4*    CBG:  Recent Labs Lab 05/07/14 1647 05/07/14 2200 05/08/14 0752 05/08/14 1155 05/08/14 1645  GLUCAP 251* 179* 155* 167* 236*    No results found for this or any previous visit (from the past 240 hour(s)).   Studies: Dg Chest Port 1 View  05/06/2014   CLINICAL DATA:   Shortness of Breath  EXAM: PORTABLE CHEST - 1 VIEW  COMPARISON:  February 08, 2014  FINDINGS: There is no edema or consolidation. There is moderate cardiomegaly. Pulmonary vascular is normal. No adenopathy. No bone lesions.  IMPRESSION: Cardiomegaly.  No edema or consolidation.   Electronically Signed   By: Lowella Grip III M.D.   On: 05/06/2014 18:28    Scheduled Meds: . aspirin  81 mg Oral QAC breakfast  . cefTRIAXone (ROCEPHIN)  IV  1 g Intravenous Q24H  . [START ON 05/09/2014] digoxin  125 mcg Oral BH-q7a  . folic acid  1 mg Oral Daily  . furosemide  40 mg Oral BID  . guaiFENesin  600 mg Oral BID  . heparin  5,000 Units Subcutaneous 3 times per day  . insulin aspart  0-15 Units Subcutaneous TID WC  . insulin glargine  16 Units Subcutaneous QHS  . lipase/protease/amylase  24,000 Units Oral TID AC  . lisinopril  10 mg Oral Daily  . [START ON 05/09/2014] metoprolol succinate  50 mg Oral BH-q7a  . pantoprazole  40 mg Oral BID  . potassium chloride SA  20 mEq Oral Daily  . sodium chloride  3 mL Intravenous Q12H  . thiamine  100 mg Oral Daily  . vitamin B-12  1,000 mcg Oral Daily   Continuous Infusions: . albuterol Stopped (05/06/14 1955)     Time spent: 20 minutes    Don Brown  [facility] Pager [phone]. If 7PM-7AM, please contact night-coverage at [email or web address], password [facility] 05/08/2014, 5:18 PM  LOS: 2 days

## 2014-05-09 LAB — GLUCOSE, CAPILLARY
GLUCOSE-CAPILLARY: 134 mg/dL — AB (ref 70–99)
GLUCOSE-CAPILLARY: 167 mg/dL — AB (ref 70–99)
Glucose-Capillary: 173 mg/dL — ABNORMAL HIGH (ref 70–99)
Glucose-Capillary: 161 mg/dL — ABNORMAL HIGH (ref 70–99)

## 2014-05-09 LAB — BASIC METABOLIC PANEL
Anion gap: 10 (ref 5–15)
BUN: 31 mg/dL — ABNORMAL HIGH (ref 6–23)
CO2: 26 mmol/L (ref 19–32)
Calcium: 9.3 mg/dL (ref 8.4–10.5)
Chloride: 104 mmol/L (ref 96–112)
Creatinine, Ser: 1.22 mg/dL (ref 0.50–1.35)
GFR calc Af Amer: 73 mL/min — ABNORMAL LOW (ref >90)
GFR, EST NON AFRICAN AMERICAN: 63 mL/min — AB (ref >90)
GLUCOSE: 148 mg/dL — AB (ref 70–99)
POTASSIUM: 4.6 mmol/L (ref 3.5–5.1)
Sodium: 140 mmol/L (ref 135–145)

## 2014-05-09 MED ORDER — INSULIN GLARGINE 100 UNIT/ML ~~LOC~~ SOLN
10.0000 [IU] | Freq: Once | SUBCUTANEOUS | Status: AC
  Administered 2014-05-09: 10 [IU] via SUBCUTANEOUS
  Filled 2014-05-09: qty 0.1

## 2014-05-09 NOTE — Progress Notes (Signed)
Pt watched several DM education videos on his own. Reports learning some facts he did not know.

## 2014-05-09 NOTE — Clinical Social Work Note (Signed)
CSW met with patient and provided transportation resources for patient (including [facility], Medicaid transport etc.). CSW signing off at this time.   Liz Beach MSW, [place], [place], [phone]

## 2014-05-09 NOTE — Progress Notes (Signed)
Pt reports he was on a statin drug PTA, but forgot to mention it. He does not know which one or what dose. Pharm tech to f/u with patient and attempt to get the info from the [place], however this can take a few days.

## 2014-05-09 NOTE — Progress Notes (Signed)
Patient CBG 161. Patient refusing to take 16 units of Lantus, states "if i were home, I'd only be taking 10 units of lantus with that sugar." kirby , np notified.

## 2014-05-09 NOTE — Progress Notes (Signed)
Spoke with patient about diabetes and home regimen for diabetes control. Patient reports that he is followed by his doctors at the [facility] for diabetes management and currently he takes Lantus 10-16 units QHS (depending on glucose at bedtime) and Novolog 0-18 units BID with meals (with breakfast and supper)  as an outpatient for diabetes control.  Patent states that he has been on insulin for about one year and up until about one month ago he was taking Lantus 10 units QHS. According to the patient, about one month ago he noticed that his blood glucose was running higher than normal for him and he talked with his doctors at the [place] who instructed him to increase his Lantus as needed for higher glucose readings. Therefore, patient checks his glucose at bedtime and determines how much Lantus he will take based on reading. Patient reports that he checks his blood sugar 3 times per day and it has been running in the 200-300's lately.  Inquired about knowledge about A1C and patient reports that he knows what an A1C is and reports that it went up recently. Discussed A1C results (9.7% on 05/06/14), basic pathophysiology of DM Type 2, basic home care, importance of checking CBGs and maintaining good CBG control to prevent long-term and short-term complications. Discussed impact of nutrition, exercise, stress, sickness, and medications on diabetes control. Patient states that he will continue to work with his doctors at the [place] to improve glycemic control.   Patient verbalized understanding of information discussed and he states that he has no further questions at this time related to diabetes.   Thanks, Barnie Alderman, RN, MSN, CCRN Diabetes Coordinator Inpatient Diabetes Program [phone] (Team Pager) [phone] ([facility] office) [phone] [facility] office)

## 2014-05-09 NOTE — Progress Notes (Signed)
[facility] PROGRESS NOTE  [name] TWS:[id number] DOB: 06-16-1955 DOA: 05/06/2014 PCP: PROVIDER NOT IN SYSTEM  Brief interim summary: Don Brown is a 59 y.o. male with past medical history of chronic pancreatitis, chronic atrial fibrillation, chronic systolic heart failure EF 25%, chronic abdominal pain, ventral hernia, essential hypertension, MGUS who called EMS due to progressively worsening blood sugar over last 1 week with readings running high and showing positive ketones. Apparently, patient was dyspneic on the way, requiring BiPAP support and his sugars were not terribly high in the ED with no evidence of ketoacidosis. However, his urine was positive for Streptococcus and he was started on treatment for suspected pneumonia, with Ceftriaxone/Zithromax(Zithromax discontinued on 05/07/14 as Ceftriaxone sufficient for strep). Hemoglobin A1c is 9.7. Discussed in detail witht he patient, why he was started on IV antibiotics and that he needs to complete the course of the antibiotics. His cbgs are better controlled today. He is on 2 lit [place] oxygen intermittent at home and he is requiring 3 lit of [place] oxygen . Plan to wean him down the oxygen to 2 lit [place] .   subjective: Patient is very upset and discussed in detail of the reason for antibiotics.  He reports breathing is better.   Plan Streptococcal infection/Acute respiratory failure Improving, he is on day 2 of antibiotics. Plan to change to po antibiotics on discharge.   AKI (acute kidney injury)  Improved with hydration. Recheck in am.  Atrial fibrillation/chronic systolic CHF EF 00%/FVCBSWHQPRF/FMB (hypertension), benign  Rate controlled. Resume home meds. Reviewed patients meds. Encouraged to be compliant to his medications, lasix, and digoxin, bb and lisinopril. .  Chronic pancreatitis/Abdominal pain/Chronic pain   currently denies any pain. Pain meds as needed Morbid obesity/Type 2 diabetes mellitus  Hemoglobin A1c 9.7. He  will need tighter control long term.  SSI CBG (last 3)   Recent Labs  05/09/14 0751 05/09/14 1214 05/09/14 1713  GLUCAP 134* 173* 167*    Resume  Code Status: Full code Family Communication: No family at bedside Disposition Plan: Eventually home tomorrow.    Consultants:  None  Procedures:  None  Antibiotics:  Ceftriaxone 05/06/2014>  Azithromycin 05/06/2014>05/07/14.  Objective: Filed Vitals:   05/09/14 1443  BP: 120/81  Pulse: 79  Temp: 98.8 F (37.1 C)  Resp: 20    Intake/Output Summary (Last 24 hours) at 05/09/14 1942 Last data filed at 05/09/14 0932  Gross per 24 hour  Intake    240 ml  Output   2100 ml  Net  -1860 ml   Filed Weights   05/07/14 0534 05/08/14 0605 05/09/14 0548  Weight: 145 kg (319 lb 10.7 oz) 142.6 kg (314 lb 6 oz) 137.2 kg (302 lb 7.5 oz)    Exam:   General:  Comfortable at rest. But anxious.   Cardiovascular: S1-S2 normal. No murmurs. Pulse regular.  Respiratory: Good air entry bilaterally. No rhonchi or rales.no wheezing heard  Abdomen: Soft and nontender. Normal bowel sounds. No organomegaly.  Musculoskeletal: trace pedal edema.   Neurological: Intact, no focal deficits.   Data Reviewed: Basic Metabolic Panel:  Recent Labs Lab 05/06/14 1824 05/06/14 1843 05/06/14 2240 05/08/14 0615 05/09/14 0744  NA 139 144 139 139 140  K 4.6 4.7 4.4 4.3 4.6  CL 110 108 108 105 104  CO2 24  --  26 27 26   GLUCOSE 150* 147* 172* 143* 148*  BUN 29* 33* 29* 27* 31*  CREATININE 1.40* 1.40* 1.32 1.34 1.22  CALCIUM 9.4  --  8.9 8.7 9.3   Liver Function Tests:  Recent Labs Lab 05/06/14 1824 05/08/14 0615  AST 22 16  ALT 16 14  ALKPHOS 75 56  BILITOT 0.8 0.8  PROT 8.9* 8.4*  ALBUMIN 3.7 3.3*    Recent Labs Lab 05/06/14 1824 05/06/14 2240  LIPASE 27 23   No results for input(s): AMMONIA in the last 168 hours. CBC:  Recent Labs Lab 05/06/14 1843 05/06/14 2240  WBC  --  4.1  NEUTROABS  --  3.1  HGB 16.0  12.2*  HCT 47.0 38.6*  MCV  --  88.1  PLT  --  124*   Cardiac Enzymes:  Recent Labs Lab 05/06/14 2240  TROPONINI <0.03   BNP (last 3 results)  Recent Labs  05/06/14 2240  BNP 130.4*    ProBNP (last 3 results)  Recent Labs  02/10/14 0819 02/11/14 0441 02/12/14 0434  PROBNP 829.3* 539.9* 386.4*    CBG:  Recent Labs Lab 05/08/14 1645 05/08/14 2226 05/09/14 0751 05/09/14 1214 05/09/14 1713  GLUCAP 236* 171* 134* 173* 167*    No results found for this or any previous visit (from the past 240 hour(s)).   Studies: No results found.  Scheduled Meds: . aspirin  81 mg Oral QAC breakfast  . cefTRIAXone (ROCEPHIN)  IV  1 g Intravenous Q24H  . digoxin  125 mcg Oral BH-q7a  . folic acid  1 mg Oral Daily  . furosemide  40 mg Oral BID  . guaiFENesin  600 mg Oral BID  . heparin  5,000 Units Subcutaneous 3 times per day  . insulin aspart  0-15 Units Subcutaneous TID WC  . insulin glargine  16 Units Subcutaneous QHS  . lipase/protease/amylase  24,000 Units Oral TID AC  . lisinopril  10 mg Oral Daily  . metoprolol succinate  50 mg Oral BH-q7a  . pantoprazole  40 mg Oral BID  . potassium chloride SA  20 mEq Oral Daily  . sodium chloride  3 mL Intravenous Q12H  . thiamine  100 mg Oral Daily  . vitamin B-12  1,000 mcg Oral Daily   Continuous Infusions: . albuterol Stopped (05/06/14 1955)     Time spent: 20 minutes    [facility] Pager [phone]. If 7PM-7AM, please contact night-coverage at [email or web address], password [facility] 05/09/2014, 7:42 PM  LOS: 3 days

## 2014-05-10 LAB — GLUCOSE, CAPILLARY
GLUCOSE-CAPILLARY: 196 mg/dL — AB (ref 70–99)
Glucose-Capillary: 138 mg/dL — ABNORMAL HIGH (ref 70–99)
Glucose-Capillary: 166 mg/dL — ABNORMAL HIGH (ref 70–99)
Glucose-Capillary: 196 mg/dL — ABNORMAL HIGH (ref 70–99)

## 2014-05-10 LAB — LIPASE, BLOOD: Lipase: 18 U/L (ref 11–59)

## 2014-05-10 MED ORDER — AMOXICILLIN-POT CLAVULANATE 875-125 MG PO TABS: 1.0000 | ORAL_TABLET | Freq: Two times a day (BID) | ORAL | Status: DC

## 2014-05-10 MED ORDER — CEFTRIAXONE SODIUM IN DEXTROSE 20 MG/ML IV SOLN
1.0000 g | INTRAVENOUS | Status: DC
  Filled 2014-05-10: qty 50

## 2014-05-10 NOTE — Discharge Summary (Signed)
Physician Discharge Summary  Don Brown VZS:[id number] DOB: 11/08/1955 DOA: 05/06/2014  PCP: PROVIDER NOT IN SYSTEM  Admit date: 05/06/2014 Discharge date: 05/10/2014  Time spent: 25  minutes  Recommendations for Outpatient Follow-up:  1. Please follow up with your [place] in one week.  2. Please follow up with home health RN.   Discharge Diagnoses:  Active Problems:   Atrial fibrillation   Chronic pancreatitis   Acute respiratory failure   AKI (acute kidney injury)   Morbid obesity   HTN (hypertension), benign   Abdominal pain   Chronic pain   Type 2 diabetes mellitus   Bradycardia   Streptococcal infection COMMUNITY ACQUIRED PNEUMONIA.   Discharge Condition: IMPROVED.   Diet recommendation: LOW sodium diet  Filed Weights   05/08/14 0605 05/09/14 0548 05/10/14 0540  Weight: 142.6 kg (314 lb 6 oz) 137.2 kg (302 lb 7.5 oz) 142.9 kg (315 lb 0.6 oz)    History of present illness:  Don Brown is a 59 y.o. male with past medical history of chronic pancreatitis, chronic atrial fibrillation, chronic systolic heart failure EF 25%, chronic abdominal pain, ventral hernia, essential hypertension, MGUS who called EMS due to progressively worsening blood sugar over last 1 week with readings running high and showing positive ketones. Apparently, patient was dyspneic on the way, requiring BiPAP support and his sugars were not terribly high in the ED with no evidence of ketoacidosis. However, his urine was positive for Streptococcus and he was started on treatment for suspected pneumonia, with Ceftriaxone/Zithromax(Zithromax discontinued on 05/07/14 as Ceftriaxone sufficient for strep). Hemoglobin A1c is 9.7. Discussed in detail witht he patient, why he was started on IV antibiotics and that he needs to complete the course of the antibiotics. His cbgs are better controlled today. He is on 2 lit [place] oxygen intermittent at home and he is requiring 3 lit of [place] oxygen .  He was discharged on home  oxygen after evaluating his ambulatory oxygen levels. His CXR shows improvement and he was discharged home oral antibiotics to complete the course.  Hospital Course:  Streptococcal infection/Acute respiratory failure; possibly sec to CAP H e was started on IV rocephin and his repeat CXR showed improved pneumonia.  His IV antibiotics changed to po, on discharge.   AKI (acute kidney injury)  Improved with hydration.  Atrial fibrillation/chronic systolic CHF EF 44%/BEEFEOFHQRF/XJO (hypertension), benign  Rate controlled. Resume home meds. Reviewed patients meds. Encouraged to be compliant to his medications, lasix, and digoxin, bb and lisinopril. . Chronic pancreatitis/Abdominal pain/Chronic pain  currently denies any pain. Pain meds as needed Morbid obesity/Type 2 diabetes mellitus  Hemoglobin A1c 9.7. He will need tighter control long term.  SSI  Procedures:  none  Consultations:  none  Discharge Exam: Filed Vitals:   05/10/14 1344  BP: 117/66  Pulse:   Temp: 97.4 F (36.3 C)  Resp: 20    General: alert afebrile comfortable Cardiovascular: s1s2 Respiratory: ctab  Discharge Instructions   Discharge Instructions    Diet - low sodium heart healthy    Complete by:  As directed      Discharge instructions    Complete by:  As directed   Please follow up with your PCP in 2 weeks.          Current Discharge Medication List    START taking these medications   Details  amoxicillin-clavulanate (AUGMENTIN) 875-125 MG per tablet Take 1 tablet by mouth 2 (two) times daily. Qty: 8 tablet, Refills: 0  CONTINUE these medications which have NOT CHANGED   Details  Amylase-Lipase-Protease (PANCREASE PO) Take 2 capsules by mouth 3 (three) times daily with meals.     aspirin 81 MG tablet Take 81 mg by mouth daily before breakfast.     Cyanocobalamin (VITAMIN B 12 PO) Take 1 tablet by mouth daily.     digoxin (LANOXIN) 0.125 MG tablet Take 1 tablet (125 mcg total)  by mouth every morning. Qty: 31 tablet, Refills: 0    folic acid (FOLVITE) 1 MG tablet Take 1 tablet (1 mg total) by mouth daily. Qty: 30 tablet, Refills: 0    furosemide (LASIX) 40 MG tablet Take 40 mg by mouth 2 (two) times daily.    Green Tea, Camillia sinensis, (GREEN TEA PO) Take 2 tablets by mouth daily.     HYDROmorphone (DILAUDID) 2 MG tablet Take 2 mg by mouth every 4 (four) hours as needed for severe pain.    insulin aspart (NOVOLOG) 100 UNIT/ML injection Before each meal 3 times a day and then bedtime, 140-199 - 2 units, 200-250 - 4 units, 251-299 - 6 units,  300-349 - 8 units,  350 or above 10 units. Dispense syringes and needles as needed, Ok to switch to PEN if approved. Okay to switch to any approved short-acting insulin. Qty: 1 vial, Refills: 12    insulin glargine (LANTUS) 100 UNIT/ML injection Inject 16 Units into the skin at bedtime.    lisinopril (PRINIVIL,ZESTRIL) 10 MG tablet Take 1 tablet (10 mg total) by mouth daily. Qty: 30 tablet, Refills: 0    metoprolol succinate (TOPROL-XL) 50 MG 24 hr tablet Take 1 tablet (50 mg total) by mouth every morning. Qty: 30 tablet, Refills: 0    Omega-3 Fatty Acids (FISH OIL) 1000 MG CAPS Take 1 capsule by mouth 2 (two) times daily.    oxyCODONE 10 MG TABS Take 1 tablet (10 mg total) by mouth every 6 (six) hours as needed for severe pain. Qty: 56 tablet, Refills: 0    pantoprazole (PROTONIX) 40 MG tablet Take 1 tablet (40 mg total) by mouth 2 (two) times daily. Qty: 60 tablet, Refills: 0    potassium chloride SA (K-DUR,KLOR-CON) 20 MEQ tablet Take 2 tablets (40 mEq total) by mouth daily. Qty: 62 tablet, Refills: 0    promethazine (PHENERGAN) 25 MG tablet Take 25 mg by mouth every 8 (eight) hours as needed for nausea or vomiting.    thiamine 100 MG tablet Take 1 tablet (100 mg total) by mouth daily. Qty: 30 tablet, Refills: 0    Vitamin D, Ergocalciferol, (DRISDOL) 50000 UNITS CAPS capsule Take 50,000 Units by mouth every  7 (seven) days.    glucose monitoring kit (FREESTYLE) monitoring kit 1 each by Does not apply route 4 (four) times daily - after meals and at bedtime. 1 month Diabetic Testing Supplies for QAC-QHS accuchecks.Any brand OK Qty: 1 each, Refills: 1      STOP taking these medications     ibuprofen (ADVIL,MOTRIN) 200 MG tablet      acetaminophen (TYLENOL) 325 MG tablet      guaiFENesin (MUCINEX) 600 MG 12 hr tablet        Allergies  Allergen Reactions  . Morphine And Related Nausea And Vomiting      The results of significant diagnostics from this hospitalization (including imaging, microbiology, ancillary and laboratory) are listed below for reference.    Significant Diagnostic Studies: Dg Chest Port 1 View  05/06/2014   CLINICAL DATA:  Shortness of  Breath  EXAM: PORTABLE CHEST - 1 VIEW  COMPARISON:  February 08, 2014  FINDINGS: There is no edema or consolidation. There is moderate cardiomegaly. Pulmonary vascular is normal. No adenopathy. No bone lesions.  IMPRESSION: Cardiomegaly.  No edema or consolidation.   Electronically Signed   By: William  Woodruff III M.D.   On: 05/06/2014 18:28    Microbiology: No results found for this or any previous visit (from the past 240 hour(s)).   Labs: Basic Metabolic Panel:  Recent Labs Lab 05/06/14 1824 05/06/14 1843 05/06/14 2240 05/08/14 0615 05/09/14 0744  NA 139 144 139 139 140  K 4.6 4.7 4.4 4.3 4.6  CL 110 108 108 105 104  CO2 24  --  26 27 26  GLUCOSE 150* 147* 172* 143* 148*  BUN 29* 33* 29* 27* 31*  CREATININE 1.40* 1.40* 1.32 1.34 1.22  CALCIUM 9.4  --  8.9 8.7 9.3   Liver Function Tests:  Recent Labs Lab 05/06/14 1824 05/08/14 0615  AST 22 16  ALT 16 14  ALKPHOS 75 56  BILITOT 0.8 0.8  PROT 8.9* 8.4*  ALBUMIN 3.7 3.3*    Recent Labs Lab 05/06/14 1824 05/06/14 2240 05/10/14 1222  LIPASE 27 23 18   No results for input(s): AMMONIA in the last 168 hours. CBC:  Recent Labs Lab 05/06/14 1843  05/06/14 2240  WBC  --  4.1  NEUTROABS  --  3.1  HGB 16.0 12.2*  HCT 47.0 38.6*  MCV  --  88.1  PLT  --  124*   Cardiac Enzymes:  Recent Labs Lab 05/06/14 2240  TROPONINI <0.03   BNP: BNP (last 3 results)  Recent Labs  05/06/14 2240  BNP 130.4*    ProBNP (last 3 results)  Recent Labs  02/10/14 0819 02/11/14 0441 02/12/14 0434  PROBNP 829.3* 539.9* 386.4*    CBG:  Recent Labs Lab 05/09/14 1713 05/09/14 2144 05/09/14 2355 05/10/14 0802 05/10/14 1136  GLUCAP 167* 161* 166* 138* 196*       Signed:  ,  [facility] 05/10/2014, 2:50 PM     

## 2014-05-10 NOTE — Progress Notes (Signed)
Patient discharge teaching given, including activity, diet, follow-up appoints, and medications. Patient verbalized understanding of all discharge instructions. IV access was d/c'd. Vitals are stable. Skin is intact except as charted in most recent assessments. Pt to be escorted out by NT, to be driven home by family. 

## 2014-06-17 ENCOUNTER — Emergency Department (HOSPITAL_COMMUNITY): Payer: Non-veteran care

## 2014-06-17 ENCOUNTER — Emergency Department (HOSPITAL_COMMUNITY)
Admission: EM | Admit: 2014-06-17 | Discharge: 2014-06-18 | Disposition: A | Discharge status: Home / Self Care | Payer: Non-veteran care | Attending: Emergency Medicine | Admitting: Emergency Medicine

## 2014-06-17 ENCOUNTER — Encounter (HOSPITAL_COMMUNITY): Payer: Self-pay

## 2014-06-17 DIAGNOSIS — Z792 Long term (current) use of antibiotics: Secondary | ICD-10-CM | POA: Diagnosis not present

## 2014-06-17 DIAGNOSIS — Z8659 Personal history of other mental and behavioral disorders: Secondary | ICD-10-CM | POA: Insufficient documentation

## 2014-06-17 DIAGNOSIS — Z794 Long term (current) use of insulin: Secondary | ICD-10-CM | POA: Diagnosis not present

## 2014-06-17 DIAGNOSIS — Z9889 Other specified postprocedural states: Secondary | ICD-10-CM | POA: Diagnosis not present

## 2014-06-17 DIAGNOSIS — Z8579 Personal history of other malignant neoplasms of lymphoid, hematopoietic and related tissues: Secondary | ICD-10-CM | POA: Insufficient documentation

## 2014-06-17 DIAGNOSIS — R739 Hyperglycemia, unspecified: Secondary | ICD-10-CM

## 2014-06-17 DIAGNOSIS — I10 Essential (primary) hypertension: Secondary | ICD-10-CM | POA: Diagnosis not present

## 2014-06-17 DIAGNOSIS — E119 Type 2 diabetes mellitus without complications: Secondary | ICD-10-CM | POA: Insufficient documentation

## 2014-06-17 DIAGNOSIS — R109 Unspecified abdominal pain: Secondary | ICD-10-CM

## 2014-06-17 DIAGNOSIS — R1084 Generalized abdominal pain: Secondary | ICD-10-CM | POA: Diagnosis not present

## 2014-06-17 DIAGNOSIS — Z9981 Dependence on supplemental oxygen: Secondary | ICD-10-CM | POA: Insufficient documentation

## 2014-06-17 DIAGNOSIS — Z9049 Acquired absence of other specified parts of digestive tract: Secondary | ICD-10-CM | POA: Insufficient documentation

## 2014-06-17 DIAGNOSIS — Z7982 Long term (current) use of aspirin: Secondary | ICD-10-CM | POA: Insufficient documentation

## 2014-06-17 DIAGNOSIS — I5022 Chronic systolic (congestive) heart failure: Secondary | ICD-10-CM | POA: Insufficient documentation

## 2014-06-17 DIAGNOSIS — G4730 Sleep apnea, unspecified: Secondary | ICD-10-CM | POA: Insufficient documentation

## 2014-06-17 DIAGNOSIS — D649 Anemia, unspecified: Secondary | ICD-10-CM | POA: Diagnosis not present

## 2014-06-17 DIAGNOSIS — Z79899 Other long term (current) drug therapy: Secondary | ICD-10-CM | POA: Diagnosis not present

## 2014-06-17 DIAGNOSIS — R011 Cardiac murmur, unspecified: Secondary | ICD-10-CM | POA: Diagnosis not present

## 2014-06-17 DIAGNOSIS — E785 Hyperlipidemia, unspecified: Secondary | ICD-10-CM | POA: Diagnosis not present

## 2014-06-17 DIAGNOSIS — Z8719 Personal history of other diseases of the digestive system: Secondary | ICD-10-CM | POA: Diagnosis not present

## 2014-06-17 DIAGNOSIS — R0602 Shortness of breath: Secondary | ICD-10-CM | POA: Diagnosis not present

## 2014-06-17 DIAGNOSIS — I4891 Unspecified atrial fibrillation: Secondary | ICD-10-CM | POA: Insufficient documentation

## 2014-06-17 DIAGNOSIS — Z8709 Personal history of other diseases of the respiratory system: Secondary | ICD-10-CM | POA: Diagnosis not present

## 2014-06-17 DIAGNOSIS — G8929 Other chronic pain: Secondary | ICD-10-CM | POA: Diagnosis not present

## 2014-06-17 LAB — COMPREHENSIVE METABOLIC PANEL
ALK PHOS: 72 U/L (ref 39–117)
ALT: 17 U/L (ref 0–53)
AST: 19 U/L (ref 0–37)
Albumin: 3.5 g/dL (ref 3.5–5.2)
Anion gap: 4 — ABNORMAL LOW (ref 5–15)
BUN: 17 mg/dL (ref 6–23)
CO2: 26 mmol/L (ref 19–32)
Calcium: 8.5 mg/dL (ref 8.4–10.5)
Chloride: 104 mmol/L (ref 96–112)
Creatinine, Ser: 0.93 mg/dL (ref 0.50–1.35)
GFR calc Af Amer: >90 mL/min (ref >90)
GFR calc non Af Amer: >90 mL/min (ref >90)
Glucose, Bld: 368 mg/dL — ABNORMAL HIGH (ref 70–99)
Potassium: 4.2 mmol/L (ref 3.5–5.1)
Sodium: 134 mmol/L — ABNORMAL LOW (ref 135–145)
Total Bilirubin: 0.5 mg/dL (ref 0.3–1.2)
Total Protein: 8.3 g/dL (ref 6.0–8.3)

## 2014-06-17 LAB — CBC WITH DIFFERENTIAL/PLATELET
Basophils Absolute: 0 10*3/uL (ref 0.0–0.1)
Basophils Relative: 0 % (ref 0–1)
EOS PCT: 4 % (ref 0–5)
Eosinophils Absolute: 0.1 10*3/uL (ref 0.0–0.7)
HCT: 38.1 % — ABNORMAL LOW (ref 39.0–52.0)
HEMOGLOBIN: 11.7 g/dL — AB (ref 13.0–17.0)
LYMPHS ABS: 0.6 10*3/uL — AB (ref 0.7–4.0)
Lymphocytes Relative: 17 % (ref 12–46)
MCH: 28.3 pg (ref 26.0–34.0)
MCHC: 30.7 g/dL (ref 30.0–36.0)
MCV: 92.3 fL (ref 78.0–100.0)
Monocytes Absolute: 0.2 10*3/uL (ref 0.1–1.0)
Monocytes Relative: 7 % (ref 3–12)
Neutro Abs: 2.5 10*3/uL (ref 1.7–7.7)
Neutrophils Relative %: 72 % (ref 43–77)
Platelets: 141 10*3/uL — ABNORMAL LOW (ref 150–400)
RBC: 4.13 MIL/uL — AB (ref 4.22–5.81)
RDW: 14.1 % (ref 11.5–15.5)
WBC: 3.4 10*3/uL — ABNORMAL LOW (ref 4.0–10.5)

## 2014-06-17 LAB — CBG MONITORING, ED
GLUCOSE-CAPILLARY: 186 mg/dL — AB (ref 70–99)
Glucose-Capillary: 371 mg/dL — ABNORMAL HIGH (ref 70–99)
Glucose-Capillary: 258 mg/dL — ABNORMAL HIGH (ref 70–99)

## 2014-06-17 LAB — URINALYSIS, ROUTINE W REFLEX MICROSCOPIC
Bilirubin Urine: NEGATIVE
Glucose, UA: >1000 mg/dL — AB
Hgb urine dipstick: NEGATIVE
KETONES UR: NEGATIVE mg/dL
Leukocytes, UA: NEGATIVE
NITRITE: NEGATIVE
Protein, ur: NEGATIVE mg/dL
Specific Gravity, Urine: 1.024 (ref 1.005–1.030)
Urobilinogen, UA: 0.2 mg/dL (ref 0.0–1.0)
pH: 5.5 (ref 5.0–8.0)

## 2014-06-17 LAB — URINE MICROSCOPIC-ADD ON

## 2014-06-17 MED ORDER — ONDANSETRON HCL 4 MG/2ML IJ SOLN
4.0000 mg | Freq: Once | INTRAMUSCULAR | Status: AC
  Administered 2014-06-17: 4 mg via INTRAVENOUS
  Filled 2014-06-17: qty 2

## 2014-06-17 MED ORDER — SODIUM CHLORIDE 0.9 % IV BOLUS (SEPSIS)
500.0000 mL | Freq: Once | INTRAVENOUS | Status: AC
  Administered 2014-06-17: 500 mL via INTRAVENOUS

## 2014-06-17 MED ORDER — SODIUM CHLORIDE 0.9 % IV BOLUS (SEPSIS): 1000.0000 mL | Freq: Once | INTRAVENOUS | Status: DC

## 2014-06-17 MED ORDER — HYDROMORPHONE HCL 1 MG/ML IJ SOLN
1.0000 mg | Freq: Once | INTRAMUSCULAR | Status: AC
  Administered 2014-06-17: 1 mg via INTRAVENOUS
  Filled 2014-06-17: qty 1

## 2014-06-17 NOTE — ED Notes (Signed)
Patient transported to X-ray 

## 2014-06-17 NOTE — ED Notes (Signed)
Pt refused to do EKG. Pt also stated he was ready to be discharged.

## 2014-06-17 NOTE — ED Provider Notes (Signed)
CSN: [id number]     Arrival date & time 06/17/14  1916 History   First MD Initiated Contact with Patient 06/17/14 1919     Chief Complaint  Patient presents with  . Hyperglycemia     (Consider location/radiation/quality/duration/timing/severity/associated sxs/prior Treatment) HPI Comments: Patient with a history of DM Type II presents today with a chief complaint of hyperglycemia.  He states that yesterday his blood sugar was 364.  Today he reports that his glucometer read "high".  He then took 14 Units of Novolog around 5:30 PM and rechecked his blood sugar at 6 PM.  When the glucometer still read "high" he decided to call EMS to come to the ED.  He reports that he has been taking his Insulin as directed.  He reports associated nausea, but denies vomiting.  He reports abdominal pain, but reports that the pain is the same chronic pain that he has had for years.  He denies fever, chills, vomiting, diarrhea, or constipation.  Denies chest pain.  He does report shortness of breath, but states that he is chronically short of breath.  He is on chronic 2 Liters [place] Oxygen at home.    The history is provided by the patient.    Past Medical History  Diagnosis Date  . 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 Atrial fibrillation   . Chronic systolic heart failure     LVEF 40-45%  . Morbid obesity   . Chronic pain   . Narcotic dependence, episodic use   . Respiratory failure     History of ventilatory-dependent respiratory failure and tracheostomy   . History of renal failure     Requiring hemodialysis in the past with normal renal function at this point  . History of ventral hernia repair   . Cervical compression fracture     Cervical disk disease requiring surgery  . Essential hypertension, benign   . Hyperlipidemia   . Degenerative joint disease   . Heart  murmur   . MGUS (monoclonal gammopathy of unknown significance)     "or myelona" (05/07/2014)  . On home oxygen therapy     "2L prn" (05/07/2014)  . CHF (congestive heart failure)   . Mitral valve prolapse   . Sleep apnea     "don't wear mask" (05/07/2014)  . Type 2 diabetes mellitus   . Anemia   . History of blood transfusion 2007    "related to OR"  . Headache     "stress"  . Anxiety   . Depression   . Myeloma     "or MGUS" (05/07/2014)   Past Surgical History  Procedure Laterality Date  . Ventral hernia repair    . Cholecystectomy    . Tracheostomy  2007  . Pancreas surgery      "placed stents; multiple pseudo cysts removal"  . Skin graft    . Insertion of mesh  10/2006  . Hernia repair    . Bone marrow biopsy  02/2014  . Cataract extraction w/ intraocular lens  implant, bilateral Bilateral    Family History  Problem Relation Age of Onset  . Hypertension Mother   . Diabetes Mother   . Lymphoma Father    History  Substance Use Topics  . Smoking status: Never Smoker   . Smokeless tobacco: Never Used  . Alcohol Use: No    Review of Systems  All other systems reviewed and are negative.     Allergies  Morphine and related  Home Medications   Prior to Admission medications   Medication Sig Start Date End Date Taking? Authorizing Provider  amoxicillin-clavulanate (AUGMENTIN) 875-125 MG per tablet Take 1 tablet by mouth 2 (two) times daily. 05/10/14   Hosie Poisson, MD  Amylase-Lipase-Protease (PANCREASE PO) Take 2 capsules by mouth 3 (three) times daily with meals.     Historical Provider, MD  aspirin 81 MG tablet Take 81 mg by mouth daily before breakfast.     Historical Provider, MD  Cyanocobalamin (VITAMIN B 12 PO) Take 1 tablet by mouth daily.     Historical Provider, MD  digoxin (LANOXIN) 0.125 MG tablet Take 1 tablet (125 mcg total) by mouth every morning. 02/12/14   Eugenie Filler, MD  folic acid (FOLVITE) 1 MG tablet Take 1 tablet (1 mg total) by mouth  daily. 05/17/13   Thurnell Lose, MD  furosemide (LASIX) 40 MG tablet Take 40 mg by mouth 2 (two) times daily.    Historical Provider, MD  glucose monitoring kit (FREESTYLE) monitoring kit 1 each by Does not apply route 4 (four) times daily - after meals and at bedtime. 1 month Diabetic Testing Supplies for QAC-QHS accuchecks.Any brand OK 05/17/13   Thurnell Lose, MD  Nyoka Cowden Tea, Camillia sinensis, (GREEN TEA PO) Take 2 tablets by mouth daily.     Historical Provider, MD  HYDROmorphone (DILAUDID) 2 MG tablet Take 2 mg by mouth every 4 (four) hours as needed for severe pain.    Historical Provider, MD  insulin aspart (NOVOLOG) 100 UNIT/ML injection Before each meal 3 times a day and then bedtime, 140-199 - 2 units, 200-250 - 4 units, 251-299 - 6 units,  300-349 - 8 units,  350 or above 10 units. Dispense syringes and needles as needed, Ok to switch to PEN if approved. Okay to switch to any approved short-acting insulin. 05/17/13   Thurnell Lose, MD  insulin glargine (LANTUS) 100 UNIT/ML injection Inject 16 Units into the skin at bedtime.    Historical Provider, MD  lisinopril (PRINIVIL,ZESTRIL) 10 MG tablet Take 1 tablet (10 mg total) by mouth daily. 02/12/14   Eugenie Filler, MD  metoprolol succinate (TOPROL-XL) 50 MG 24 hr tablet Take 1 tablet (50 mg total) by mouth every morning. 02/12/14   Eugenie Filler, MD  Omega-3 Fatty Acids (FISH OIL) 1000 MG CAPS Take 1 capsule by mouth 2 (two) times daily.    Historical Provider, MD  oxyCODONE 10 MG TABS Take 1 tablet (10 mg total) by mouth every 6 (six) hours as needed for severe pain. 10/06/13   Jerrye Noble, MD  pantoprazole (PROTONIX) 40 MG tablet Take 1 tablet (40 mg total) by mouth 2 (two) times daily. 05/17/13   Thurnell Lose, MD  potassium chloride SA (K-DUR,KLOR-CON) 20 MEQ tablet Take 2 tablets (40 mEq total) by mouth daily. 02/12/14   Eugenie Filler, MD  promethazine (PHENERGAN) 25 MG tablet Take 25 mg by mouth every 8 (eight) hours  as needed for nausea or vomiting.    Historical Provider, MD  thiamine 100 MG tablet Take 1 tablet (100 mg total) by mouth daily. 05/17/13   Thurnell Lose, MD  Vitamin D, Ergocalciferol, (DRISDOL) 50000 UNITS CAPS capsule Take 50,000 Units by mouth every 7 (seven) days.    Historical Provider, MD   BP 99/51 mmHg  Pulse 46  Temp(Src) 97.9 F (36.6  C) (Oral)  Resp 15  SpO2 100% Physical Exam  Constitutional: He appears well-developed and well-nourished.  HENT:  Head: Normocephalic and atraumatic.  Mouth/Throat: Oropharynx is clear and moist.  Neck: Normal range of motion. Neck supple.  Cardiovascular: Normal rate, regular rhythm and normal heart sounds.   Pulmonary/Chest: Effort normal and breath sounds normal. No respiratory distress. He has no wheezes. He has no rales.  Abdominal: Bowel sounds are normal. He exhibits no mass. There is tenderness. There is no rebound and no guarding.  Large abdominal scar Diffuse tenderness to palpation  Musculoskeletal: Normal range of motion.  Neurological: He is alert.  Skin: Skin is warm and dry.  Psychiatric: He has a normal mood and affect.  Nursing note and vitals reviewed.   ED Course  Procedures (including critical care time) Labs Review Labs Reviewed  CBC WITH DIFFERENTIAL/PLATELET - Abnormal; Notable for the following:    WBC 3.4 (*)    RBC 4.13 (*)    Hemoglobin 11.7 (*)    HCT 38.1 (*)    Platelets 141 (*)    Lymphs Abs 0.6 (*)    All other components within normal limits  URINALYSIS, ROUTINE W REFLEX MICROSCOPIC - Abnormal; Notable for the following:    APPearance CLOUDY (*)    Glucose, UA >1000 (*)    All other components within normal limits  COMPREHENSIVE METABOLIC PANEL - Abnormal; Notable for the following:    Sodium 134 (*)    Glucose, Bld 368 (*)    Anion gap 4 (*)    All other components within normal limits  BRAIN NATRIURETIC PEPTIDE - Abnormal; Notable for the following:    B Natriuretic Peptide 181.2 (*)     All other components within normal limits  CBG MONITORING, ED - Abnormal; Notable for the following:    Glucose-Capillary 371 (*)    All other components within normal limits  CBG MONITORING, ED - Abnormal; Notable for the following:    Glucose-Capillary 258 (*)    All other components within normal limits  CBG MONITORING, ED - Abnormal; Notable for the following:    Glucose-Capillary 186 (*)    All other components within normal limits  URINE MICROSCOPIC-ADD ON  CBG MONITORING, ED    Imaging Review No results found.   EKG Interpretation   Date/Time:  _0 :00 AM Upon discharge patient requested a dose of IV Dilaudid for his chronic abdominal pain before he left.  When patient was informed that he would not be getting the Dilaudid and could take the pain medication he has at home for his chronic pain, he then became very angry and accused staff of being racist.   MDM   Final diagnoses:  None   Patient presents today with a chief complaint of hyperglycemia.  He had taken 14 units of his Novolog approximately 1.5 hours prior to arrival.  Blood sugar 371 upon arrival in the ED. Bicarb of 26.  Anion gap of 4.  No ketones in the Urine.  Therefore, do not feel that the patient is in DKA.  After 500 cc of IVF  blood sugar came down to 186.  CXR shows some vascular congestion, but patient is not hypoxic on his normal 2 L [place] Oxygen.  BNP is 181.  Patient denies chest pain.  No ischemic changes on EKG.  Patient is stable for discharge.  Patient also evaluated by Dr. Colin Rhein.  Return precautions given to  the patient.    Hyman Bible, PA-C 06/19/14 2221  Debby Freiberg, MD 06/20/14 [phone]

## 2014-06-17 NOTE — ED Notes (Signed)
Bed: FR10 Expected date: 06/17/14 Expected time: 7:14 PM Means of arrival:  Comments: Hyperglycemia

## 2014-06-17 NOTE — ED Notes (Signed)
Pt presents via EMS from home with c/o hyperglycemia. Pt reported he was reading high on his meter at home for 2 days. Pt reports that yesterday he took his CBG, read high on the meter and it went down with insulin. Today, he did the same and there has been no change. Initially high for EMS and after 250-324ml of NS cbg is now 457. IV 22g right hand started by EMS.

## 2014-06-18 LAB — BRAIN NATRIURETIC PEPTIDE: B Natriuretic Peptide: 181.2 pg/mL — ABNORMAL HIGH (ref 0.0–100.0)

## 2014-06-18 NOTE — ED Notes (Signed)
Prior to discharge, pt requesting more pain medication. Spoke with provider who reported that pt had pain medication at home that he could take for his chronic abdominal pain as he had already received some medication here. Advised pt of this and he told this RN that this was a race issue and we were discriminating against him. Explained to pt that this was not the case. Pt refused to sign discharge signature pad and said that if he did, he would be signing under duress.

## 2014-07-26 ENCOUNTER — Emergency Department (HOSPITAL_COMMUNITY): Payer: Non-veteran care

## 2014-07-26 ENCOUNTER — Encounter (HOSPITAL_COMMUNITY): Payer: Self-pay | Admitting: Emergency Medicine

## 2014-07-26 ENCOUNTER — Emergency Department (HOSPITAL_COMMUNITY)
Admission: EM | Admit: 2014-07-26 | Discharge: 2014-07-26 | Disposition: A | Discharge status: Home / Self Care | Payer: Non-veteran care | Attending: Emergency Medicine | Admitting: Emergency Medicine

## 2014-07-26 DIAGNOSIS — Z7982 Long term (current) use of aspirin: Secondary | ICD-10-CM | POA: Diagnosis not present

## 2014-07-26 DIAGNOSIS — F419 Anxiety disorder, unspecified: Secondary | ICD-10-CM | POA: Diagnosis not present

## 2014-07-26 DIAGNOSIS — Z8579 Personal history of other malignant neoplasms of lymphoid, hematopoietic and related tissues: Secondary | ICD-10-CM | POA: Diagnosis not present

## 2014-07-26 DIAGNOSIS — Z8739 Personal history of other diseases of the musculoskeletal system and connective tissue: Secondary | ICD-10-CM | POA: Insufficient documentation

## 2014-07-26 DIAGNOSIS — R1010 Upper abdominal pain, unspecified: Secondary | ICD-10-CM | POA: Diagnosis not present

## 2014-07-26 DIAGNOSIS — Z794 Long term (current) use of insulin: Secondary | ICD-10-CM | POA: Insufficient documentation

## 2014-07-26 DIAGNOSIS — G8929 Other chronic pain: Secondary | ICD-10-CM

## 2014-07-26 DIAGNOSIS — Z8781 Personal history of (healed) traumatic fracture: Secondary | ICD-10-CM | POA: Insufficient documentation

## 2014-07-26 DIAGNOSIS — E119 Type 2 diabetes mellitus without complications: Secondary | ICD-10-CM | POA: Insufficient documentation

## 2014-07-26 DIAGNOSIS — F329 Major depressive disorder, single episode, unspecified: Secondary | ICD-10-CM | POA: Insufficient documentation

## 2014-07-26 DIAGNOSIS — E785 Hyperlipidemia, unspecified: Secondary | ICD-10-CM | POA: Insufficient documentation

## 2014-07-26 DIAGNOSIS — R197 Diarrhea, unspecified: Secondary | ICD-10-CM | POA: Diagnosis not present

## 2014-07-26 DIAGNOSIS — R011 Cardiac murmur, unspecified: Secondary | ICD-10-CM | POA: Diagnosis not present

## 2014-07-26 DIAGNOSIS — R112 Nausea with vomiting, unspecified: Secondary | ICD-10-CM | POA: Diagnosis not present

## 2014-07-26 DIAGNOSIS — I5022 Chronic systolic (congestive) heart failure: Secondary | ICD-10-CM | POA: Insufficient documentation

## 2014-07-26 DIAGNOSIS — I10 Essential (primary) hypertension: Secondary | ICD-10-CM | POA: Diagnosis not present

## 2014-07-26 DIAGNOSIS — Z87448 Personal history of other diseases of urinary system: Secondary | ICD-10-CM | POA: Diagnosis not present

## 2014-07-26 DIAGNOSIS — D649 Anemia, unspecified: Secondary | ICD-10-CM | POA: Diagnosis not present

## 2014-07-26 DIAGNOSIS — Z79899 Other long term (current) drug therapy: Secondary | ICD-10-CM | POA: Insufficient documentation

## 2014-07-26 DIAGNOSIS — R109 Unspecified abdominal pain: Secondary | ICD-10-CM

## 2014-07-26 DIAGNOSIS — Z792 Long term (current) use of antibiotics: Secondary | ICD-10-CM | POA: Diagnosis not present

## 2014-07-26 DIAGNOSIS — Z9049 Acquired absence of other specified parts of digestive tract: Secondary | ICD-10-CM | POA: Insufficient documentation

## 2014-07-26 LAB — CBC WITH DIFFERENTIAL/PLATELET
Basophils Absolute: 0 10*3/uL (ref 0.0–0.1)
Basophils Relative: 0 % (ref 0–1)
EOS ABS: 0.1 10*3/uL (ref 0.0–0.7)
EOS PCT: 2 % (ref 0–5)
HEMATOCRIT: 44.2 % (ref 39.0–52.0)
HEMOGLOBIN: 13.6 g/dL (ref 13.0–17.0)
LYMPHS ABS: 0.8 10*3/uL (ref 0.7–4.0)
LYMPHS PCT: 15 % (ref 12–46)
MCH: 27.6 pg (ref 26.0–34.0)
MCHC: 30.8 g/dL (ref 30.0–36.0)
MCV: 89.7 fL (ref 78.0–100.0)
MONO ABS: 0.3 10*3/uL (ref 0.1–1.0)
MONOS PCT: 6 % (ref 3–12)
Neutro Abs: 4.3 10*3/uL (ref 1.7–7.7)
Neutrophils Relative %: 77 % (ref 43–77)
PLATELETS: 192 10*3/uL (ref 150–400)
RBC: 4.93 MIL/uL (ref 4.22–5.81)
RDW: 13.6 % (ref 11.5–15.5)
WBC: 5.5 10*3/uL (ref 4.0–10.5)

## 2014-07-26 LAB — COMPREHENSIVE METABOLIC PANEL
ALK PHOS: 55 U/L (ref 38–126)
ALT: 12 U/L — ABNORMAL LOW (ref 17–63)
ANION GAP: 8 (ref 5–15)
AST: 15 U/L (ref 15–41)
Albumin: 3.3 g/dL — ABNORMAL LOW (ref 3.5–5.0)
BILIRUBIN TOTAL: 0.6 mg/dL (ref 0.3–1.2)
BUN: 15 mg/dL (ref 6–20)
CO2: 30 mmol/L (ref 22–32)
CREATININE: 1.18 mg/dL (ref 0.61–1.24)
Calcium: 9.3 mg/dL (ref 8.9–10.3)
Chloride: 104 mmol/L (ref 101–111)
Glucose, Bld: 230 mg/dL — ABNORMAL HIGH (ref 70–99)
Potassium: 4.2 mmol/L (ref 3.5–5.1)
SODIUM: 142 mmol/L (ref 135–145)
TOTAL PROTEIN: 8.1 g/dL (ref 6.5–8.1)

## 2014-07-26 LAB — LIPASE, BLOOD: Lipase: 21 U/L — ABNORMAL LOW (ref 22–51)

## 2014-07-26 MED ORDER — ONDANSETRON HCL 4 MG PO TABS: 4.0000 mg | ORAL_TABLET | Freq: Three times a day (TID) | ORAL | Status: DC | PRN

## 2014-07-26 MED ORDER — HYDROMORPHONE HCL 1 MG/ML IJ SOLN
1.0000 mg | Freq: Once | INTRAMUSCULAR | Status: AC
  Administered 2014-07-26: 1 mg via INTRAVENOUS
  Filled 2014-07-26: qty 1

## 2014-07-26 MED ORDER — KETOROLAC TROMETHAMINE 30 MG/ML IJ SOLN
30.0000 mg | Freq: Once | INTRAMUSCULAR | Status: AC
Start: 2014-07-26 — End: 2014-07-26
  Administered 2014-07-26: 30 mg via INTRAVENOUS
  Filled 2014-07-26: qty 1

## 2014-07-26 MED ORDER — FENTANYL CITRATE (PF) 100 MCG/2ML IJ SOLN: 50.0000 ug | Freq: Once | INTRAMUSCULAR | Status: DC

## 2014-07-26 MED ORDER — FENTANYL CITRATE (PF) 100 MCG/2ML IJ SOLN
50.0000 ug | Freq: Once | INTRAMUSCULAR | Status: AC
  Administered 2014-07-26: 50 ug via NASAL
  Filled 2014-07-26: qty 2

## 2014-07-26 MED ORDER — ONDANSETRON HCL 4 MG/2ML IJ SOLN
4.0000 mg | Freq: Once | INTRAMUSCULAR | Status: AC
  Administered 2014-07-26: 4 mg via INTRAVENOUS
  Filled 2014-07-26: qty 2

## 2014-07-26 NOTE — Discharge Planning (Signed)
Spoke with pt at bedside regarding discharge.  Pt adamite about waiting to talk to [facility] social worker prior to leaving [place].  NCM called Danae Chen, SW of [facility]- awaiting call back.  Pt aware that he can not wait until she calls back and may escorted off premises.

## 2014-07-26 NOTE — ED Notes (Signed)
Pt arrives from home via [facility], c/o LUQ pain with N/V.  Pt reports hx chronic pancreatitis, reports this pain intermittent for past week, worsening since last night.  Pt reports unable to keep dilaudid down at home.

## 2014-07-26 NOTE — ED Provider Notes (Signed)
CSN: [id number]     Arrival date & time 07/26/14  0426 History   First MD Initiated Contact with Patient 07/26/14 [phone]     Chief Complaint  Patient presents with  . Abdominal Pain  . Emesis     (Consider location/radiation/quality/duration/timing/severity/associated sxs/prior Treatment) HPI Don Brown is a 59 year old male with past medical history of chronic pancreatitis, A. fib, CHF, morbid obesity, chronic pain, narcotic dependence who presents the ER complaining of upper abdominal pain. Patient states since last night he has a flareup of what he describes as his chronic pancreatitis pain. Patient states he has been taking his home pain medication, and has been unable to keep them down this morning due to associated nausea and vomiting. Patient also reports associated diarrhea. Patient states his pain is consistent with an identical to previous episodes of chronic pancreatitis flareup in the past. Patient denies chest pain, shortness of breath, dizziness, weakness, fever, dysuria.  Past Medical History  Diagnosis Date  . 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 Atrial fibrillation   . Chronic systolic heart failure     LVEF 40-45%  . Morbid obesity   . Chronic pain   . Narcotic dependence, episodic use   . Respiratory failure     History of ventilatory-dependent respiratory failure and tracheostomy   . History of renal failure     Requiring hemodialysis in the past with normal renal function at this point  . History of ventral hernia repair   . Cervical compression fracture     Cervical disk disease requiring surgery  . Essential hypertension, benign   . Hyperlipidemia   . Degenerative joint disease   . Heart murmur   . MGUS (monoclonal gammopathy of unknown significance)     "or myelona" (05/07/2014)  . On home oxygen therapy     "2L prn"  (05/07/2014)  . CHF (congestive heart failure)   . Mitral valve prolapse   . Sleep apnea     "don't wear mask" (05/07/2014)  . Type 2 diabetes mellitus   . Anemia   . History of blood transfusion 2007    "related to OR"  . Headache     "stress"  . Anxiety   . Depression   . Myeloma     "or MGUS" (05/07/2014)   Past Surgical History  Procedure Laterality Date  . Ventral hernia repair    . Cholecystectomy    . Tracheostomy  2007  . Pancreas surgery      "placed stents; multiple pseudo cysts removal"  . Skin graft    . Insertion of mesh  10/2006  . Hernia repair    . Bone marrow biopsy  02/2014  . Cataract extraction w/ intraocular lens  implant, bilateral Bilateral    Family History  Problem Relation Age of Onset  . Hypertension Mother   . Diabetes Mother   . Lymphoma Father    History  Substance Use Topics  . Smoking status: Never Smoker   . Smokeless tobacco: Never Used  . Alcohol Use: No    Review of Systems  Constitutional: Negative for fever.  HENT: Negative for trouble swallowing.   Eyes: Negative for visual disturbance.  Respiratory: Negative for shortness of breath.   Cardiovascular: Negative for chest pain.  Gastrointestinal: Positive for nausea, vomiting, abdominal pain and diarrhea.  Genitourinary: Negative for dysuria.  Musculoskeletal: Negative for  neck pain.  Skin: Negative for rash.  Neurological: Negative for dizziness, weakness and numbness.  Psychiatric/Behavioral: Negative.       Allergies  Morphine and related  Home Medications   Prior to Admission medications   Medication Sig Start Date End Date Taking? Authorizing Provider  amoxicillin-clavulanate (AUGMENTIN) 875-125 MG per tablet Take 1 tablet by mouth 2 (two) times daily. 05/10/14   Hosie Poisson, MD  Amylase-Lipase-Protease (PANCREASE PO) Take 2 capsules by mouth 3 (three) times daily with meals.     Historical Provider, MD  aspirin 81 MG tablet Take 81 mg by mouth daily before  breakfast.     Historical Provider, MD  Cyanocobalamin (VITAMIN B 12 PO) Take 1 tablet by mouth daily.     Historical Provider, MD  digoxin (LANOXIN) 0.125 MG tablet Take 1 tablet (125 mcg total) by mouth every morning. 02/12/14   Eugenie Filler, MD  folic acid (FOLVITE) 1 MG tablet Take 1 tablet (1 mg total) by mouth daily. 05/17/13   Thurnell Lose, MD  furosemide (LASIX) 40 MG tablet Take 40 mg by mouth 2 (two) times daily.    Historical Provider, MD  gabapentin (NEURONTIN) 400 MG capsule Take 400 mg by mouth 3 (three) times daily.    Historical Provider, MD  glucose monitoring kit (FREESTYLE) monitoring kit 1 each by Does not apply route 4 (four) times daily - after meals and at bedtime. 1 month Diabetic Testing Supplies for QAC-QHS accuchecks.Any brand OK 05/17/13   Thurnell Lose, MD  Nyoka Cowden Tea, Camillia sinensis, (GREEN TEA PO) Take 2 tablets by mouth daily.     Historical Provider, MD  HYDROmorphone (DILAUDID) 2 MG tablet Take 2 mg by mouth every 4 (four) hours as needed for severe pain.    Historical Provider, MD  ibuprofen (ADVIL,MOTRIN) 200 MG tablet Take 400 mg by mouth every 6 (six) hours as needed for headache, mild pain or moderate pain.    Historical Provider, MD  insulin aspart (NOVOLOG) 100 UNIT/ML injection Before each meal 3 times a day and then bedtime, 140-199 - 2 units, 200-250 - 4 units, 251-299 - 6 units,  300-349 - 8 units,  350 or above 10 units. Dispense syringes and needles as needed, Ok to switch to PEN if approved. Okay to switch to any approved short-acting insulin. 05/17/13   Thurnell Lose, MD  insulin glargine (LANTUS) 100 UNIT/ML injection Inject 16 Units into the skin at bedtime.    Historical Provider, MD  lisinopril (PRINIVIL,ZESTRIL) 10 MG tablet Take 1 tablet (10 mg total) by mouth daily. 02/12/14   Eugenie Filler, MD  metoprolol succinate (TOPROL-XL) 50 MG 24 hr tablet Take 1 tablet (50 mg total) by mouth every morning. 02/12/14   Eugenie Filler, MD   Omega-3 Fatty Acids (FISH OIL) 1000 MG CAPS Take 1 capsule by mouth 2 (two) times daily.    Historical Provider, MD  ondansetron (ZOFRAN) 4 MG tablet Take 1 tablet (4 mg total) by mouth every 8 (eight) hours as needed for nausea or vomiting. 07/26/14   Dahlia Bailiff, PA-C  oxyCODONE 10 MG TABS Take 1 tablet (10 mg total) by mouth every 6 (six) hours as needed for severe pain. 10/06/13   Jerrye Noble, MD  pantoprazole (PROTONIX) 40 MG tablet Take 1 tablet (40 mg total) by mouth 2 (two) times daily. 05/17/13   Thurnell Lose, MD  potassium chloride SA (K-DUR,KLOR-CON) 20 MEQ tablet Take 2 tablets (40 mEq total) by  mouth daily. 02/12/14   Eugenie Filler, MD  promethazine (PHENERGAN) 25 MG tablet Take 25 mg by mouth every 8 (eight) hours as needed for nausea or vomiting.    Historical Provider, MD  thiamine 100 MG tablet Take 1 tablet (100 mg total) by mouth daily. 05/17/13   Thurnell Lose, MD  Vitamin D, Ergocalciferol, (DRISDOL) 50000 UNITS CAPS capsule Take 50,000 Units by mouth every 7 (seven) days.    Historical Provider, MD   BP 106/67 mmHg  Pulse 76  Temp(Src) 98 F (36.7 C) (Oral)  Resp 18  Ht 5' 11" (1.803 m)  Wt 315 lb (142.883 kg)  BMI 43.95 kg/m2  SpO2 98% Physical Exam  Constitutional: He is oriented to person, place, and time. He appears well-developed and well-nourished. No distress.  HENT:  Head: Normocephalic and atraumatic.  Mouth/Throat: Oropharynx is clear and moist. No oropharyngeal exudate.  Eyes: Right eye exhibits no discharge. Left eye exhibits no discharge. No scleral icterus.  Neck: Normal range of motion.  Cardiovascular: Normal rate, regular rhythm, S1 normal, S2 normal, normal heart sounds and normal pulses.   No murmur heard. Pulmonary/Chest: Effort normal and breath sounds normal. No accessory muscle usage. No tachypnea. No respiratory distress.  Abdominal: Soft. Normal appearance and bowel sounds are normal. There is tenderness in the right upper quadrant,  epigastric area and left upper quadrant. There is no rigidity, no guarding, no tenderness at McBurney's point and negative Murphy's sign.  Diffuse upper abdominal tenderness. There are multiple well-healed, surgical scars noted on patient's abdomen.  Musculoskeletal: Normal range of motion. He exhibits no edema or tenderness.  Neurological: He is alert and oriented to person, place, and time. No cranial nerve deficit. Coordination normal.  Skin: Skin is warm and dry. No rash noted. He is not diaphoretic.  Psychiatric: He has a normal mood and affect.  Nursing note and vitals reviewed.   ED Course  Procedures (including critical care time) Labs Review Labs Reviewed  COMPREHENSIVE METABOLIC PANEL - Abnormal; Notable for the following:    Glucose, Bld 230 (*)    Albumin 3.3 (*)    ALT 12 (*)    All other components within normal limits  LIPASE, BLOOD - Abnormal; Notable for the following:    Lipase 21 (*)    All other components within normal limits  CBC WITH DIFFERENTIAL/PLATELET    Imaging Review Dg Abd Acute W/chest  07/26/2014   CLINICAL DATA:  59 year old male with acute on chronic abdominal pain. Nausea and dizziness. Multiple prior surgeries, query bowel obstruction. Initial encounter.  EXAM: DG ABDOMEN ACUTE W/ 1V CHEST  COMPARISON:  Chest radiographs 06/17/2014. CT Abdomen and Pelvis 05/01/2013 and earlier.  FINDINGS: Chronically low interval improved lung volumes. Stable cardiomegaly and mediastinal contours. No pneumothorax or pneumoperitoneum. No acute pulmonary opacity.  Stable, non obstructed bowel gas pattern compared to the CT Abdomen and Pelvis in February. Stable abdominal and pelvic visceral contours. Questionable small volume of excreted IV contrast in the bladder. No acute osseous abnormality identified.  IMPRESSION: 1.  Normal bowel gas pattern, no free air. 2.  No acute cardiopulmonary abnormality.   Electronically Signed   By: Genevie Ann M.D.   On: 07/26/2014 07:40      EKG Interpretation   Date/Time:  Thursday Jul 26 2014 04:38:47 EDT Ventricular Rate:  67 PR Interval:    QRS Duration: 94 QT Interval:  382 QTC Calculation: 403 R Axis:   26 Text Interpretation:  Atrial fibrillation Abnormal  R-wave progression,  early transition Abnormal T, consider ischemia, lateral leads No  significant change since last tracing Confirmed by Debby Freiberg [phone])  on 07/26/2014 9:06:10 AM      MDM   Final diagnoses:  Chronic abdominal pain    Patient here with pain consistent with chronic pancreatitis pain, which patient specifically states to me. Examination is non-concerning for acute abdomen. Abdominal upright radiographs obtained, there is no concern for SBO. Patient's pain managed effectively here, patient without intractable emesis here as well. Labs reassuring, there is no leukocytosis or anemia. Electrolytes are within normal limits. Renal function intact. Lipase within normal limits. No anion gap concerning for metabolic acidosis. Likely patient having flareup of chronic pain. When I informed patient of our plan, patient becomes irate, demanding that he be admitted for observation. Offered patient further blood evaluation with series of troponin based on his history although his pain is consistent with an abdominal pain, however patient refused this test after I offered him multiple times, and he states he does not want [place] to do anything else in the ER other than "admit me for observation". I informed patient that this is neither appropriate nor indicated.  Patient hemodynamically stable, well-appearing and in no acute distress, we will discharge patient and have him follow-up with his primary care provider as outpatient.   BP 106/67 mmHg  Pulse 76  Temp(Src) 98 F (36.7 C) (Oral)  Resp 18  Ht 5' 11" (1.803 m)  Wt 315 lb (142.883 kg)  BMI 43.95 kg/m2  SpO2 98%  Signed,  Dahlia Bailiff, PA-C 5:58 PM  Patient seen and discussed with Dr. Debby Freiberg,  MD  Dahlia Bailiff, PA-C 07/26/14 1759  Veatrice Kells, MD 07/28/14 2303

## 2014-07-26 NOTE — Discharge Instructions (Signed)

## 2014-07-26 NOTE — ED Notes (Signed)
MD at bedside. 

## 2014-07-26 NOTE — ED Notes (Signed)
Pt called out for pain meds, reports pain 10/10.  Dr. Randal Buba made aware.

## 2014-07-26 NOTE — ED Notes (Signed)
Case management at bedside speaking with patient.

## 2014-07-26 NOTE — ED Provider Notes (Signed)
Assumed care of pt at shift change from Dr. Randal Buba.  On my assumption of care, the pt was to be discharged, but was unhappy with dc, stated that he would like to be evaluated by a physician.  I entered the room and the pt was speaking on the phone in full sentences.  I waited patiently for approximately 5 minutes, after which I asked if the pt would like to be examined, however I could not continue to wait on him to get off the phone.  I discussed his case, it appears that he states he is having a "pancreatic flare", which he states is identical to recurrent prior issues.  He frankly denies chest pain.  I discussed the pt's laboratory results, including specific values, and he demanded to have a print out of these records.  I told him he would need to do this through medical records and the appropriate portal.  The pt then demanded admission for observation.  I asked what benefit this would have, given that he had an unremarkable wu so far and he insisted on admission.  I offered further evaluation, but the pt then stuck his middle finger in my face and only repeated "I want to see another physician", he refused further wu.  Unfortunately, he has multiple chronic medical issues and ideally would like to ensure via history that the pt is not having any other etiology for his symptoms, but at this point the pt was completely noncompliant with exam or history.  I do not feel that the pt was intoxicated, and feel him competent to make his own decisions.  I had the PA offer the pt more workup as well including troponin, however the pt refused.  DC home in stable condition.  Debby Freiberg, MD 07/26/14 [phone]

## 2014-07-26 NOTE — ED Notes (Signed)
Went in to d/c pt and stated he does not want to leave. He states they always admit him for observation when he has a flare. EDP and ED PA made aware and stated they would speak with him.

## 2014-07-26 NOTE — ED Notes (Signed)
Pt verbally abusive to EDP. Security called. RN went in and attempted to calm patient. He continues to refuse to leave until a Education officer, museum has seen him. RN explained that we are unable to admit him unless he meets admission criteria. He continues to be verbally abusive and refusing to leave until he speaks to a Education officer, museum.

## 2014-07-26 NOTE — ED Notes (Signed)
PA at bedside.

## 2014-07-26 NOTE — ED Notes (Signed)
Pt requesting pain meds, pain 10/10.  Dr Randal Buba made aware.

## 2015-01-01 ENCOUNTER — Emergency Department (HOSPITAL_COMMUNITY): Payer: Non-veteran care

## 2015-01-01 ENCOUNTER — Encounter (HOSPITAL_COMMUNITY): Payer: Self-pay

## 2015-01-01 ENCOUNTER — Inpatient Hospital Stay (HOSPITAL_COMMUNITY)
Admission: EM | Admit: 2015-01-01 | Discharge: 2015-01-08 | DRG: 438 | Disposition: A | Discharge status: Home / Self Care | Payer: Non-veteran care | Attending: Internal Medicine | Admitting: Internal Medicine

## 2015-01-01 DIAGNOSIS — Z794 Long term (current) use of insulin: Secondary | ICD-10-CM

## 2015-01-01 DIAGNOSIS — Z885 Allergy status to narcotic agent status: Secondary | ICD-10-CM

## 2015-01-01 DIAGNOSIS — E876 Hypokalemia: Secondary | ICD-10-CM | POA: Diagnosis not present

## 2015-01-01 DIAGNOSIS — E1165 Type 2 diabetes mellitus with hyperglycemia: Secondary | ICD-10-CM | POA: Diagnosis present

## 2015-01-01 DIAGNOSIS — R51 Headache: Secondary | ICD-10-CM

## 2015-01-01 DIAGNOSIS — Z8249 Family history of ischemic heart disease and other diseases of the circulatory system: Secondary | ICD-10-CM

## 2015-01-01 DIAGNOSIS — I5042 Chronic combined systolic (congestive) and diastolic (congestive) heart failure: Secondary | ICD-10-CM | POA: Diagnosis present

## 2015-01-01 DIAGNOSIS — R6510 Systemic inflammatory response syndrome (SIRS) of non-infectious origin without acute organ dysfunction: Secondary | ICD-10-CM | POA: Diagnosis not present

## 2015-01-01 DIAGNOSIS — E785 Hyperlipidemia, unspecified: Secondary | ICD-10-CM | POA: Diagnosis present

## 2015-01-01 DIAGNOSIS — F419 Anxiety disorder, unspecified: Secondary | ICD-10-CM | POA: Diagnosis present

## 2015-01-01 DIAGNOSIS — K859 Acute pancreatitis without necrosis or infection, unspecified: Secondary | ICD-10-CM | POA: Diagnosis not present

## 2015-01-01 DIAGNOSIS — R519 Headache, unspecified: Secondary | ICD-10-CM

## 2015-01-01 DIAGNOSIS — Z79891 Long term (current) use of opiate analgesic: Secondary | ICD-10-CM

## 2015-01-01 DIAGNOSIS — N179 Acute kidney failure, unspecified: Secondary | ICD-10-CM | POA: Diagnosis present

## 2015-01-01 DIAGNOSIS — I4891 Unspecified atrial fibrillation: Secondary | ICD-10-CM | POA: Diagnosis present

## 2015-01-01 DIAGNOSIS — D696 Thrombocytopenia, unspecified: Secondary | ICD-10-CM | POA: Diagnosis present

## 2015-01-01 DIAGNOSIS — G8929 Other chronic pain: Secondary | ICD-10-CM | POA: Diagnosis present

## 2015-01-01 DIAGNOSIS — R109 Unspecified abdominal pain: Secondary | ICD-10-CM

## 2015-01-01 DIAGNOSIS — Z9114 Patient's other noncompliance with medication regimen: Secondary | ICD-10-CM

## 2015-01-01 DIAGNOSIS — F329 Major depressive disorder, single episode, unspecified: Secondary | ICD-10-CM | POA: Diagnosis present

## 2015-01-01 DIAGNOSIS — Z7982 Long term (current) use of aspirin: Secondary | ICD-10-CM

## 2015-01-01 DIAGNOSIS — I214 Non-ST elevation (NSTEMI) myocardial infarction: Secondary | ICD-10-CM | POA: Diagnosis present

## 2015-01-01 DIAGNOSIS — Z79899 Other long term (current) drug therapy: Secondary | ICD-10-CM

## 2015-01-01 DIAGNOSIS — I482 Chronic atrial fibrillation: Secondary | ICD-10-CM | POA: Diagnosis present

## 2015-01-01 DIAGNOSIS — I110 Hypertensive heart disease with heart failure: Secondary | ICD-10-CM | POA: Diagnosis present

## 2015-01-01 DIAGNOSIS — D472 Monoclonal gammopathy: Secondary | ICD-10-CM | POA: Diagnosis present

## 2015-01-01 DIAGNOSIS — I428 Other cardiomyopathies: Secondary | ICD-10-CM

## 2015-01-01 DIAGNOSIS — E875 Hyperkalemia: Secondary | ICD-10-CM | POA: Diagnosis present

## 2015-01-01 DIAGNOSIS — K861 Other chronic pancreatitis: Secondary | ICD-10-CM | POA: Diagnosis present

## 2015-01-01 DIAGNOSIS — E119 Type 2 diabetes mellitus without complications: Secondary | ICD-10-CM

## 2015-01-01 DIAGNOSIS — Z6841 Body Mass Index (BMI) 40.0 and over, adult: Secondary | ICD-10-CM

## 2015-01-01 DIAGNOSIS — I10 Essential (primary) hypertension: Secondary | ICD-10-CM | POA: Diagnosis present

## 2015-01-01 DIAGNOSIS — Z9981 Dependence on supplemental oxygen: Secondary | ICD-10-CM

## 2015-01-01 DIAGNOSIS — G4733 Obstructive sleep apnea (adult) (pediatric): Secondary | ICD-10-CM | POA: Diagnosis present

## 2015-01-01 DIAGNOSIS — I429 Cardiomyopathy, unspecified: Secondary | ICD-10-CM | POA: Diagnosis present

## 2015-01-01 LAB — I-STAT TROPONIN, ED: TROPONIN I, POC: 0.13 ng/mL — AB (ref 0.00–0.08)

## 2015-01-01 LAB — CBC
HCT: 42.1 % (ref 39.0–52.0)
Hemoglobin: 13.1 g/dL (ref 13.0–17.0)
MCH: 27 pg (ref 26.0–34.0)
MCHC: 31.1 g/dL (ref 30.0–36.0)
MCV: 86.6 fL (ref 78.0–100.0)
PLATELETS: 126 10*3/uL — AB (ref 150–400)
RBC: 4.86 MIL/uL (ref 4.22–5.81)
RDW: 15.5 % (ref 11.5–15.5)
WBC: 6 10*3/uL (ref 4.0–10.5)

## 2015-01-01 LAB — HEPATIC FUNCTION PANEL
ALT: 16 U/L — AB (ref 17–63)
AST: 24 U/L (ref 15–41)
Albumin: 3.3 g/dL — ABNORMAL LOW (ref 3.5–5.0)
Alkaline Phosphatase: 62 U/L (ref 38–126)
BILIRUBIN DIRECT: 0.3 mg/dL (ref 0.1–0.5)
BILIRUBIN INDIRECT: 1.1 mg/dL — AB (ref 0.3–0.9)
BILIRUBIN TOTAL: 1.4 mg/dL — AB (ref 0.3–1.2)
Total Protein: 9 g/dL — ABNORMAL HIGH (ref 6.5–8.1)

## 2015-01-01 LAB — BASIC METABOLIC PANEL
Anion gap: 9 (ref 5–15)
BUN: 16 mg/dL (ref 6–20)
CHLORIDE: 106 mmol/L (ref 101–111)
CO2: 22 mmol/L (ref 22–32)
CREATININE: 1.21 mg/dL (ref 0.61–1.24)
Calcium: 8.6 mg/dL — ABNORMAL LOW (ref 8.9–10.3)
Glucose, Bld: 103 mg/dL — ABNORMAL HIGH (ref 65–99)
POTASSIUM: 3.6 mmol/L (ref 3.5–5.1)
SODIUM: 137 mmol/L (ref 135–145)

## 2015-01-01 LAB — LIPASE, BLOOD: LIPASE: 191 U/L — AB (ref 22–51)

## 2015-01-01 LAB — DIGOXIN LEVEL: DIGOXIN LVL: 0.3 ng/mL — AB (ref 0.8–2.0)

## 2015-01-01 LAB — I-STAT CG4 LACTIC ACID, ED: Lactic Acid, Venous: 1.89 mmol/L (ref 0.5–2.0)

## 2015-01-01 MED ORDER — SODIUM CHLORIDE 0.9 % IV BOLUS (SEPSIS)
1000.0000 mL | Freq: Once | INTRAVENOUS | Status: AC
  Administered 2015-01-01: 1000 mL via INTRAVENOUS

## 2015-01-01 MED ORDER — HYDROMORPHONE HCL 1 MG/ML IJ SOLN
1.0000 mg | Freq: Once | INTRAMUSCULAR | Status: AC
  Administered 2015-01-01: 1 mg via INTRAVENOUS
  Filled 2015-01-01: qty 1

## 2015-01-01 MED ORDER — ONDANSETRON HCL 4 MG/2ML IJ SOLN
4.0000 mg | Freq: Once | INTRAMUSCULAR | Status: AC
  Administered 2015-01-01: 4 mg via INTRAVENOUS
  Filled 2015-01-01: qty 2

## 2015-01-01 MED ORDER — DILTIAZEM LOAD VIA INFUSION
20.0000 mg | Freq: Once | INTRAVENOUS | Status: DC
  Filled 2015-01-01: qty 20

## 2015-01-01 MED ORDER — DILTIAZEM HCL 100 MG IV SOLR: 5.0000 mg/h | INTRAVENOUS | Status: DC

## 2015-01-01 NOTE — ED Provider Notes (Signed)
CSN: [id number]     Arrival date & time 01/01/15  2120 History   First MD Initiated Contact with Patient 01/01/15 2128     Chief Complaint  Patient presents with  . Shortness of Breath  . Chest Pain     (Consider location/radiation/quality/duration/timing/severity/associated sxs/prior Treatment) Patient is a 59 y.o. male presenting with abdominal pain.  Abdominal Pain Pain location:  Epigastric Pain quality: aching   Pain radiates to:  Does not radiate Pain severity:  Severe Onset quality:  Gradual Duration:  2 days Timing:  Constant Progression:  Worsening Chronicity:  Recurrent Context comment:  Hx of pancreatitis Relieved by:  Nothing Worsened by:  Nothing tried Ineffective treatments:  None tried Associated symptoms: chest pain, chills, fever (subjective), nausea, shortness of breath and vomiting     Past Medical History  Diagnosis Date  . 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 Atrial fibrillation ([place])   . Chronic systolic heart failure (HCC)     LVEF 40-45%  . Morbid obesity ([place])   . Chronic pain   . Narcotic dependence, episodic use ([place])   . Respiratory failure ([place])     History of ventilatory-dependent respiratory failure and tracheostomy   . History of renal failure     Requiring hemodialysis in the past with normal renal function at this point  . History of ventral hernia repair   . Cervical compression fracture (HCC)     Cervical disk disease requiring surgery  . Essential hypertension, benign   . Hyperlipidemia   . Degenerative joint disease   . Heart murmur   . MGUS (monoclonal gammopathy of unknown significance)     "or myelona" (05/07/2014)  . On home oxygen therapy     "2L prn" (05/07/2014)  . CHF (congestive heart failure) ([place])   . Mitral valve prolapse   . Sleep apnea     "don't wear mask" (05/07/2014)  .  Type 2 diabetes mellitus ([place])   . Anemia   . History of blood transfusion 2007    "related to OR"  . Headache     "stress"  . Anxiety   . Depression   . Myeloma ([place])     "or MGUS" (05/07/2014)   Past Surgical History  Procedure Laterality Date  . Ventral hernia repair    . Cholecystectomy    . Tracheostomy  2007  . Pancreas surgery      "placed stents; multiple pseudo cysts removal"  . Skin graft    . Insertion of mesh  10/2006  . Hernia repair    . Bone marrow biopsy  02/2014  . Cataract extraction w/ intraocular lens  implant, bilateral Bilateral    Family History  Problem Relation Age of Onset  . Hypertension Mother   . Diabetes Mother   . Lymphoma Father    Social History  Substance Use Topics  . Smoking status: Never Smoker   . Smokeless tobacco: Never Used  . Alcohol Use: No    Review of Systems  Constitutional: Positive for fever (subjective) and chills.  Respiratory: Positive for shortness of breath.   Cardiovascular: Positive for chest pain.  Gastrointestinal: Positive for nausea, vomiting and abdominal pain.  All other systems reviewed and are negative.     Allergies  Morphine and related  Home Medications   Prior to Admission medications   Medication Sig Start Date End  Date Taking? Authorizing Provider  amoxicillin-clavulanate (AUGMENTIN) 875-125 MG per tablet Take 1 tablet by mouth 2 (two) times daily. 05/10/14   Hosie Poisson, MD  Amylase-Lipase-Protease (PANCREASE PO) Take 2 capsules by mouth 3 (three) times daily with meals.     Historical Provider, MD  aspirin 81 MG tablet Take 81 mg by mouth daily before breakfast.     Historical Provider, MD  Cyanocobalamin (VITAMIN B 12 PO) Take 1 tablet by mouth daily.     Historical Provider, MD  digoxin (LANOXIN) 0.125 MG tablet Take 1 tablet (125 mcg total) by mouth every morning. 02/12/14   Eugenie Filler, MD  folic acid (FOLVITE) 1 MG tablet Take 1 tablet (1 mg total) by mouth daily. 05/17/13    Thurnell Lose, MD  furosemide (LASIX) 40 MG tablet Take 40 mg by mouth 2 (two) times daily.    Historical Provider, MD  gabapentin (NEURONTIN) 400 MG capsule Take 400 mg by mouth 3 (three) times daily.    Historical Provider, MD  glucose monitoring kit (FREESTYLE) monitoring kit 1 each by Does not apply route 4 (four) times daily - after meals and at bedtime. 1 month Diabetic Testing Supplies for QAC-QHS accuchecks.Any brand OK 05/17/13   Thurnell Lose, MD  Nyoka Cowden Tea, Camillia sinensis, (GREEN TEA PO) Take 2 tablets by mouth daily.     Historical Provider, MD  HYDROmorphone (DILAUDID) 2 MG tablet Take 2 mg by mouth every 4 (four) hours as needed for severe pain.    Historical Provider, MD  ibuprofen (ADVIL,MOTRIN) 200 MG tablet Take 400 mg by mouth every 6 (six) hours as needed for headache, mild pain or moderate pain.    Historical Provider, MD  insulin aspart (NOVOLOG) 100 UNIT/ML injection Before each meal 3 times a day and then bedtime, 140-199 - 2 units, 200-250 - 4 units, 251-299 - 6 units,  300-349 - 8 units,  350 or above 10 units. Dispense syringes and needles as needed, Ok to switch to PEN if approved. Okay to switch to any approved short-acting insulin. 05/17/13   Thurnell Lose, MD  insulin glargine (LANTUS) 100 UNIT/ML injection Inject 16 Units into the skin at bedtime.    Historical Provider, MD  lisinopril (PRINIVIL,ZESTRIL) 10 MG tablet Take 1 tablet (10 mg total) by mouth daily. 02/12/14   Eugenie Filler, MD  metoprolol succinate (TOPROL-XL) 50 MG 24 hr tablet Take 1 tablet (50 mg total) by mouth every morning. 02/12/14   Eugenie Filler, MD  Omega-3 Fatty Acids (FISH OIL) 1000 MG CAPS Take 1 capsule by mouth 2 (two) times daily.    Historical Provider, MD  ondansetron (ZOFRAN) 4 MG tablet Take 1 tablet (4 mg total) by mouth every 8 (eight) hours as needed for nausea or vomiting. 07/26/14   Dahlia Bailiff, PA-C  oxyCODONE 10 MG TABS Take 1 tablet (10 mg total) by mouth every 6  (six) hours as needed for severe pain. 10/06/13   Jerrye Noble, MD  pantoprazole (PROTONIX) 40 MG tablet Take 1 tablet (40 mg total) by mouth 2 (two) times daily. 05/17/13   Thurnell Lose, MD  potassium chloride SA (K-DUR,KLOR-CON) 20 MEQ tablet Take 2 tablets (40 mEq total) by mouth daily. 02/12/14   Eugenie Filler, MD  promethazine (PHENERGAN) 25 MG tablet Take 25 mg by mouth every 8 (eight) hours as needed for nausea or vomiting.    Historical Provider, MD  thiamine 100 MG tablet Take 1 tablet (100  mg total) by mouth daily. 05/17/13   Thurnell Lose, MD  Vitamin D, Ergocalciferol, (DRISDOL) 50000 UNITS CAPS capsule Take 50,000 Units by mouth every 7 (seven) days.    Historical Provider, MD   BP 123/79 mmHg  Pulse 112  Temp(Src) 99.8 F (37.7 C) (Oral)  Resp 25  SpO2 97% Physical Exam  Constitutional: He is oriented to person, place, and time. He appears well-developed and well-nourished. No distress.  HENT:  Head: Normocephalic and atraumatic.  Mouth/Throat: Oropharynx is clear and moist.  Eyes: Conjunctivae are normal. Pupils are equal, round, and reactive to light. No scleral icterus.  Neck: Neck supple.  Cardiovascular: Intact distal pulses.  An irregularly irregular rhythm present. Tachycardia present.  Exam reveals distant heart sounds.   No murmur heard. Pulmonary/Chest: Effort normal and breath sounds normal. No stridor. No respiratory distress. He has no wheezes. He has no rales.  Abdominal: Soft. He exhibits no distension. There is tenderness in the epigastric area. There is no rigidity, no rebound and no guarding.  Obese.  TTP in epigastrium.    Musculoskeletal: Normal range of motion. He exhibits no edema.  Neurological: He is alert and oriented to person, place, and time.  Skin: Skin is warm and dry. No rash noted.  Psychiatric: He has a normal mood and affect. His behavior is normal.  Nursing note and vitals reviewed.   ED Course  .Critical Care Performed by:  Serita Grit Authorized by: Serita Grit Total critical care time: 40 minutes Critical care time was exclusive of separately billable procedures and treating other patients. Critical care was necessary to treat or prevent imminent or life-threatening deterioration of the following conditions: respiratory failure and circulatory failure. Critical care was time spent personally by me on the following activities: development of treatment plan with patient or surrogate, discussions with consultants, evaluation of patient's response to treatment, examination of patient, obtaining history from patient or surrogate, ordering and performing treatments and interventions, ordering and review of laboratory studies, ordering and review of radiographic studies, pulse oximetry, re-evaluation of patient's condition and review of old charts.   (including critical care time) Labs Review Labs Reviewed  BASIC METABOLIC PANEL - Abnormal; Notable for the following:    Glucose, Bld 103 (*)    Calcium 8.6 (*)    All other components within normal limits  CBC - Abnormal; Notable for the following:    Platelets 126 (*)    All other components within normal limits  LIPASE, BLOOD - Abnormal; Notable for the following:    Lipase 191 (*)    All other components within normal limits  HEPATIC FUNCTION PANEL - Abnormal; Notable for the following:    Total Protein 9.0 (*)    Albumin 3.3 (*)    ALT 16 (*)    Total Bilirubin 1.4 (*)    Indirect Bilirubin 1.1 (*)    All other components within normal limits  DIGOXIN LEVEL - Abnormal; Notable for the following:    Digoxin Level 0.3 (*)    All other components within normal limits  I-STAT TROPOININ, ED - Abnormal; Notable for the following:    Troponin i, poc 0.13 (*)    All other components within normal limits  I-STAT CG4 LACTIC ACID, ED    Imaging Review Dg Chest Port 1 View  01/01/2015  CLINICAL DATA:  Acute shortness of breath and chest pain. EXAM: PORTABLE  CHEST 1 VIEW COMPARISON:  07/26/2014 and prior radiographs FINDINGS: Cardiomegaly, pulmonary vascular congestion and mild  bibasilar atelectasis noted. This is a low volume film. There is no evidence of focal airspace disease, pulmonary edema, suspicious pulmonary nodule/mass, pleural effusion, or pneumothorax. No acute bony abnormalities are identified. IMPRESSION: Cardiomegaly with pulmonary vascular congestion and bibasilar atelectasis. Electronically Signed   By: Margarette Canada M.D.   On: 01/01/2015 22:46    EKG Interpretation   Date/Time:  Tuesday January 01 2015 21:27:56 EDT Ventricular Rate:  117 PR Interval:    QRS Duration: 96 QT Interval:  281 QTC Calculation: 392 R Axis:   37 Text Interpretation:  Atrial fibrillation Repol abnrm suggests ischemia,  diffuse leads Baseline wander in lead(s) III aVF compared to prior, a fib  is faster and ST/T abnormalities more prominent Confirmed by [facility]  MD,  TREY ([id number]) on 01/01/2015 10:35:06 PM      MDM   Final diagnoses:  Acute pancreatitis, unspecified pancreatitis type  Atrial fibrillation with RVR Respiratory distress  59 yo male with hx of a fib and recurrent pancreatitis.  Reported that he felt that his pancreatitis had flared.  Also found to be in A fib.  Initial troponin elevated, felt to be secondary to increased HR and demand ischemia.  His HR improved somewhat with IV fluids. Fluid resuscitation had to be balanced with his CHF.  Increased WOB continued, so trialed on Bipap, which appeared to improve his WOB somewhat.  Admitted to stepdown unit by Internal Medicine.     Serita Grit, MD 01/02/15 [phone]

## 2015-01-01 NOTE — ED Notes (Signed)
Per [facility]: pt complaining of shortness of breath, chest pain, and abdominal pain. Pt does have hx of pancreatitis and believes that he started to have a flare up yesterday. Pt has been throwing up today. Pt does have A fib, initially rate 130-170, Rate then dropped to 120-130. Pt does have CHF, lung sounds are clear, pt does have a cough, non productive.   Pt is currently diaphoretic.

## 2015-01-01 NOTE — ED Notes (Signed)
The patient refused a rectal temperature. The tech reported to the RN in charge.

## 2015-01-01 NOTE — ED Notes (Signed)
Dr. Doy Mince

## 2015-01-01 NOTE — ED Notes (Signed)
Dr. Wofford at bedside 

## 2015-01-01 NOTE — ED Notes (Signed)
Pt refusing rectal temperature 

## 2015-01-01 NOTE — ED Notes (Signed)
Pt on oxygen at home, continuously, 2 L [place]

## 2015-01-02 ENCOUNTER — Encounter (HOSPITAL_COMMUNITY): Payer: Self-pay | Admitting: Family Medicine

## 2015-01-02 DIAGNOSIS — R6510 Systemic inflammatory response syndrome (SIRS) of non-infectious origin without acute organ dysfunction: Secondary | ICD-10-CM | POA: Diagnosis not present

## 2015-01-02 DIAGNOSIS — Z794 Long term (current) use of insulin: Secondary | ICD-10-CM

## 2015-01-02 DIAGNOSIS — E875 Hyperkalemia: Secondary | ICD-10-CM | POA: Diagnosis present

## 2015-01-02 DIAGNOSIS — Z79899 Other long term (current) drug therapy: Secondary | ICD-10-CM | POA: Diagnosis not present

## 2015-01-02 DIAGNOSIS — K859 Acute pancreatitis without necrosis or infection, unspecified: Secondary | ICD-10-CM | POA: Diagnosis present

## 2015-01-02 DIAGNOSIS — I429 Cardiomyopathy, unspecified: Secondary | ICD-10-CM

## 2015-01-02 DIAGNOSIS — N179 Acute kidney failure, unspecified: Secondary | ICD-10-CM | POA: Diagnosis present

## 2015-01-02 DIAGNOSIS — I10 Essential (primary) hypertension: Secondary | ICD-10-CM

## 2015-01-02 DIAGNOSIS — D696 Thrombocytopenia, unspecified: Secondary | ICD-10-CM | POA: Diagnosis present

## 2015-01-02 DIAGNOSIS — E1142 Type 2 diabetes mellitus with diabetic polyneuropathy: Secondary | ICD-10-CM | POA: Diagnosis not present

## 2015-01-02 DIAGNOSIS — Z79891 Long term (current) use of opiate analgesic: Secondary | ICD-10-CM | POA: Diagnosis not present

## 2015-01-02 DIAGNOSIS — I5042 Chronic combined systolic (congestive) and diastolic (congestive) heart failure: Secondary | ICD-10-CM | POA: Diagnosis present

## 2015-01-02 DIAGNOSIS — Z8249 Family history of ischemic heart disease and other diseases of the circulatory system: Secondary | ICD-10-CM | POA: Diagnosis not present

## 2015-01-02 DIAGNOSIS — E876 Hypokalemia: Secondary | ICD-10-CM | POA: Diagnosis not present

## 2015-01-02 DIAGNOSIS — G8929 Other chronic pain: Secondary | ICD-10-CM | POA: Diagnosis present

## 2015-01-02 DIAGNOSIS — I482 Chronic atrial fibrillation: Secondary | ICD-10-CM | POA: Diagnosis present

## 2015-01-02 DIAGNOSIS — E1165 Type 2 diabetes mellitus with hyperglycemia: Secondary | ICD-10-CM | POA: Diagnosis present

## 2015-01-02 DIAGNOSIS — D472 Monoclonal gammopathy: Secondary | ICD-10-CM | POA: Diagnosis present

## 2015-01-02 DIAGNOSIS — Z9114 Patient's other noncompliance with medication regimen: Secondary | ICD-10-CM | POA: Diagnosis not present

## 2015-01-02 DIAGNOSIS — K8500 Idiopathic acute pancreatitis without necrosis or infection: Secondary | ICD-10-CM | POA: Diagnosis not present

## 2015-01-02 DIAGNOSIS — G4733 Obstructive sleep apnea (adult) (pediatric): Secondary | ICD-10-CM | POA: Diagnosis present

## 2015-01-02 DIAGNOSIS — K861 Other chronic pancreatitis: Secondary | ICD-10-CM | POA: Diagnosis present

## 2015-01-02 DIAGNOSIS — Z7982 Long term (current) use of aspirin: Secondary | ICD-10-CM | POA: Diagnosis not present

## 2015-01-02 DIAGNOSIS — I4891 Unspecified atrial fibrillation: Secondary | ICD-10-CM | POA: Diagnosis not present

## 2015-01-02 DIAGNOSIS — E785 Hyperlipidemia, unspecified: Secondary | ICD-10-CM | POA: Diagnosis present

## 2015-01-02 DIAGNOSIS — F419 Anxiety disorder, unspecified: Secondary | ICD-10-CM | POA: Diagnosis present

## 2015-01-02 DIAGNOSIS — Z6841 Body Mass Index (BMI) 40.0 and over, adult: Secondary | ICD-10-CM | POA: Diagnosis not present

## 2015-01-02 DIAGNOSIS — Z885 Allergy status to narcotic agent status: Secondary | ICD-10-CM | POA: Diagnosis not present

## 2015-01-02 DIAGNOSIS — Z9981 Dependence on supplemental oxygen: Secondary | ICD-10-CM | POA: Diagnosis not present

## 2015-01-02 DIAGNOSIS — I110 Hypertensive heart disease with heart failure: Secondary | ICD-10-CM | POA: Diagnosis present

## 2015-01-02 DIAGNOSIS — I214 Non-ST elevation (NSTEMI) myocardial infarction: Secondary | ICD-10-CM | POA: Diagnosis present

## 2015-01-02 DIAGNOSIS — F329 Major depressive disorder, single episode, unspecified: Secondary | ICD-10-CM | POA: Diagnosis present

## 2015-01-02 LAB — CREATININE, SERUM
CREATININE: 1.85 mg/dL — AB (ref 0.61–1.24)
GFR calc non Af Amer: 38 mL/min — ABNORMAL LOW (ref >60)
GFR, EST AFRICAN AMERICAN: 44 mL/min — AB (ref >60)

## 2015-01-02 LAB — COMPREHENSIVE METABOLIC PANEL
ALK PHOS: 50 U/L (ref 38–126)
ALT: 15 U/L — AB (ref 17–63)
ANION GAP: 11 (ref 5–15)
AST: 28 U/L (ref 15–41)
Albumin: 2.8 g/dL — ABNORMAL LOW (ref 3.5–5.0)
BILIRUBIN TOTAL: 0.8 mg/dL (ref 0.3–1.2)
BUN: 21 mg/dL — AB (ref 6–20)
CHLORIDE: 105 mmol/L (ref 101–111)
CO2: 24 mmol/L (ref 22–32)
Calcium: 8.5 mg/dL — ABNORMAL LOW (ref 8.9–10.3)
Creatinine, Ser: 1.76 mg/dL — ABNORMAL HIGH (ref 0.61–1.24)
GFR, EST AFRICAN AMERICAN: 47 mL/min — AB (ref >60)
GFR, EST NON AFRICAN AMERICAN: 41 mL/min — AB (ref >60)
Glucose, Bld: 129 mg/dL — ABNORMAL HIGH (ref 65–99)
POTASSIUM: 4.6 mmol/L (ref 3.5–5.1)
Sodium: 140 mmol/L (ref 135–145)
TOTAL PROTEIN: 8.1 g/dL (ref 6.5–8.1)

## 2015-01-02 LAB — GLUCOSE, CAPILLARY
Glucose-Capillary: 129 mg/dL — ABNORMAL HIGH (ref 65–99)
Glucose-Capillary: 91 mg/dL (ref 65–99)
Glucose-Capillary: 98 mg/dL (ref 65–99)
Glucose-Capillary: 122 mg/dL — ABNORMAL HIGH (ref 65–99)

## 2015-01-02 LAB — CBC
HEMATOCRIT: 39.7 % (ref 39.0–52.0)
Hemoglobin: 12.2 g/dL — ABNORMAL LOW (ref 13.0–17.0)
MCH: 27.1 pg (ref 26.0–34.0)
MCHC: 30.7 g/dL (ref 30.0–36.0)
MCV: 88 fL (ref 78.0–100.0)
Platelets: 119 10*3/uL — ABNORMAL LOW (ref 150–400)
RBC: 4.51 MIL/uL (ref 4.22–5.81)
RDW: 15.7 % — AB (ref 11.5–15.5)
WBC: 11.5 10*3/uL — AB (ref 4.0–10.5)

## 2015-01-02 LAB — TROPONIN I
TROPONIN I: 0.58 ng/mL — AB (ref <0.031)
Troponin I: 0.85 ng/mL (ref <0.031)
Troponin I: 0.83 ng/mL (ref <0.031)

## 2015-01-02 LAB — MRSA PCR SCREENING: MRSA BY PCR: NEGATIVE

## 2015-01-02 LAB — I-STAT CG4 LACTIC ACID, ED: LACTIC ACID, VENOUS: 1.23 mmol/L (ref 0.5–2.0)

## 2015-01-02 MED ORDER — HYDROMORPHONE HCL 1 MG/ML IJ SOLN
1.0000 mg | INTRAMUSCULAR | Status: DC | PRN
Start: 2015-01-02 — End: 2015-01-04
  Administered 2015-01-02 – 2015-01-04 (×14): 1 mg via INTRAVENOUS
  Filled 2015-01-02 (×14): qty 1

## 2015-01-02 MED ORDER — SODIUM CHLORIDE 0.9 % IV SOLN
INTRAVENOUS | Status: DC
  Administered 2015-01-02 (×2): via INTRAVENOUS

## 2015-01-02 MED ORDER — ONDANSETRON HCL 4 MG/2ML IJ SOLN
4.0000 mg | Freq: Four times a day (QID) | INTRAMUSCULAR | Status: DC | PRN
  Administered 2015-01-03 – 2015-01-04 (×3): 4 mg via INTRAVENOUS
  Filled 2015-01-02 (×4): qty 2

## 2015-01-02 MED ORDER — DIGOXIN 125 MCG PO TABS
125.0000 ug | ORAL_TABLET | ORAL | Status: DC
  Administered 2015-01-02 – 2015-01-08 (×6): 125 ug via ORAL
  Filled 2015-01-02 (×7): qty 1

## 2015-01-02 MED ORDER — LISINOPRIL 20 MG PO TABS
10.0000 mg | ORAL_TABLET | Freq: Every day | ORAL | Status: DC
  Administered 2015-01-02: 10 mg via ORAL
  Filled 2015-01-02 (×2): qty 1

## 2015-01-02 MED ORDER — POTASSIUM CHLORIDE CRYS ER 20 MEQ PO TBCR
40.0000 meq | EXTENDED_RELEASE_TABLET | Freq: Every day | ORAL | Status: DC
  Administered 2015-01-02 – 2015-01-03 (×2): 40 meq via ORAL
  Filled 2015-01-02 (×2): qty 2

## 2015-01-02 MED ORDER — SODIUM CHLORIDE 0.9 % IV BOLUS (SEPSIS)
250.0000 mL | Freq: Once | INTRAVENOUS | Status: AC
  Administered 2015-01-02: 250 mL via INTRAVENOUS

## 2015-01-02 MED ORDER — GABAPENTIN 400 MG PO CAPS
400.0000 mg | ORAL_CAPSULE | Freq: Three times a day (TID) | ORAL | Status: DC
  Filled 2015-01-02: qty 1

## 2015-01-02 MED ORDER — IOHEXOL 300 MG/ML  SOLN: 25.0000 mL | INTRAMUSCULAR | Status: AC

## 2015-01-02 MED ORDER — ASPIRIN 81 MG PO CHEW
81.0000 mg | CHEWABLE_TABLET | Freq: Every day | ORAL | Status: DC
  Administered 2015-01-02 – 2015-01-08 (×7): 81 mg via ORAL
  Filled 2015-01-02 (×7): qty 1

## 2015-01-02 MED ORDER — ONDANSETRON HCL 4 MG PO TABS: 4.0000 mg | ORAL_TABLET | Freq: Four times a day (QID) | ORAL | Status: DC | PRN

## 2015-01-02 MED ORDER — ENOXAPARIN SODIUM 40 MG/0.4ML ~~LOC~~ SOLN
40.0000 mg | SUBCUTANEOUS | Status: DC
  Administered 2015-01-06: 40 mg via SUBCUTANEOUS
  Filled 2015-01-02 (×5): qty 0.4

## 2015-01-02 MED ORDER — ACETAMINOPHEN 325 MG PO TABS
650.0000 mg | ORAL_TABLET | ORAL | Status: DC | PRN
  Administered 2015-01-02 – 2015-01-08 (×4): 650 mg via ORAL
  Filled 2015-01-02 (×4): qty 2

## 2015-01-02 MED ORDER — PANTOPRAZOLE SODIUM 40 MG PO TBEC
40.0000 mg | DELAYED_RELEASE_TABLET | Freq: Two times a day (BID) | ORAL | Status: DC
  Administered 2015-01-02 – 2015-01-08 (×12): 40 mg via ORAL
  Filled 2015-01-02 (×13): qty 1

## 2015-01-02 MED ORDER — HYDROMORPHONE HCL 1 MG/ML IJ SOLN
INTRAMUSCULAR | Status: AC
  Filled 2015-01-02: qty 1

## 2015-01-02 MED ORDER — PANCRELIPASE (LIP-PROT-AMYL) 12000-38000 UNITS PO CPEP
12000.0000 [IU] | ORAL_CAPSULE | Freq: Three times a day (TID) | ORAL | Status: DC
  Administered 2015-01-02 – 2015-01-08 (×13): 12000 [IU] via ORAL
  Filled 2015-01-02 (×15): qty 1

## 2015-01-02 MED ORDER — GABAPENTIN 300 MG PO CAPS
600.0000 mg | ORAL_CAPSULE | Freq: Three times a day (TID) | ORAL | Status: DC
Start: 2015-01-02 — End: 2015-01-04
  Administered 2015-01-02 – 2015-01-04 (×5): 600 mg via ORAL
  Filled 2015-01-02 (×2): qty 2
  Filled 2015-01-02: qty 6
  Filled 2015-01-02 (×2): qty 2

## 2015-01-02 MED ORDER — INSULIN GLARGINE 100 UNIT/ML ~~LOC~~ SOLN
16.0000 [IU] | Freq: Every day | SUBCUTANEOUS | Status: DC
  Filled 2015-01-02: qty 0.16

## 2015-01-02 MED ORDER — INSULIN ASPART 100 UNIT/ML ~~LOC~~ SOLN
0.0000 [IU] | Freq: Three times a day (TID) | SUBCUTANEOUS | Status: DC
  Administered 2015-01-04 – 2015-01-05 (×2): 3 [IU] via SUBCUTANEOUS
  Administered 2015-01-05 – 2015-01-07 (×5): 2 [IU] via SUBCUTANEOUS
  Administered 2015-01-08: 3 [IU] via SUBCUTANEOUS
  Administered 2015-01-08: 2 [IU] via SUBCUTANEOUS

## 2015-01-02 MED ORDER — SODIUM CHLORIDE 0.9 % IJ SOLN
3.0000 mL | Freq: Two times a day (BID) | INTRAMUSCULAR | Status: DC
  Administered 2015-01-02 – 2015-01-08 (×6): 3 mL via INTRAVENOUS

## 2015-01-02 MED ORDER — DEXTROSE-NACL 5-0.9 % IV SOLN
INTRAVENOUS | Status: DC
  Administered 2015-01-02 – 2015-01-05 (×4): via INTRAVENOUS
  Administered 2015-01-06: 50 mL/h via INTRAVENOUS

## 2015-01-02 MED ORDER — INSULIN ASPART 100 UNIT/ML ~~LOC~~ SOLN
0.0000 [IU] | Freq: Every day | SUBCUTANEOUS | Status: DC
Start: 2015-01-02 — End: 2015-01-02

## 2015-01-02 MED ORDER — METOPROLOL SUCCINATE ER 100 MG PO TB24
50.0000 mg | ORAL_TABLET | ORAL | Status: DC
  Administered 2015-01-02 – 2015-01-04 (×3): 50 mg via ORAL
  Filled 2015-01-02 (×3): qty 1

## 2015-01-02 NOTE — ED Notes (Signed)
Pt given urinal.

## 2015-01-02 NOTE — ED Notes (Signed)
SEE DOWNTIME DOCUMENTATION  

## 2015-01-02 NOTE — Progress Notes (Signed)
Patient placed on BIPAP per MD. Patient placed on 12/6 and 40%FIO2. Patient tolerating well sat 97%. RT will continue to monitor.

## 2015-01-02 NOTE — ED Notes (Signed)
Attempted to call report

## 2015-01-02 NOTE — H&P (Signed)
History and Physical  Don Brown  INO:[id number]  DOB: Nov 09, 1955  DOA: 01/01/2015  Referring physician: Serita Grit, MD PCP: PROVIDER NOT IN SYSTEM   Chief Complaint: Abdominal pain  HPI: Don Brown is a 59 y.o. 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  The patient reports that he was in his usual state of health until today when he developed acute and severe epigastric and left upper quadrant abdominal pain associated with nausea and vomiting. This pain is similar to his previous episodes of pancreatitis. He tried to self medicate at home with available analgesics but this failed.  In the ED, the patient was tachycardic, tachypneic, and somewhat hypoxic. His lipase was elevated to 191, his troponin was elevated to 0.13, and his renal function and WBC were normal.  He received 1 L normal saline, trial of BiPAP, and Dilaudid and was admitted to [facility] for pancreatitis.   Review of Systems:  Patient seen 2:02 AM on 01/02/2015. All other systems negative except as just noted or noted in the history of present illness.  Past Medical History  Diagnosis Date  . 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 Atrial fibrillation ([place])   . Chronic systolic heart failure (HCC)     LVEF 40-45%  . Morbid obesity ([place])   . Chronic pain   . Narcotic dependence, episodic use ([place])   . Respiratory failure ([place])     History of ventilatory-dependent respiratory failure and tracheostomy   . History of renal failure     Requiring hemodialysis in the past with normal renal function at this point  . History of ventral hernia repair   . Cervical compression fracture (HCC)    Cervical disk disease requiring surgery  . Essential hypertension, benign   . Hyperlipidemia   . Degenerative joint disease   . Heart murmur   . MGUS (monoclonal gammopathy of unknown significance)     "or myelona" (05/07/2014)  . On home oxygen therapy     "2L prn" (05/07/2014)  . CHF (congestive heart failure) ([place])   . Mitral valve prolapse   . Sleep apnea     "don't wear mask" (05/07/2014)  . Type 2 diabetes mellitus ([place])   . Anemia   . History of blood transfusion 2007    "related to OR"  . Headache     "stress"  . Anxiety   . Depression   . Myeloma ([place])     "or MGUS" (05/07/2014)  The above past medical history was reviewed.  Past Surgical History  Procedure Laterality Date  . Ventral hernia repair    . Cholecystectomy    . Tracheostomy  2007  . Pancreas surgery      "placed stents; multiple pseudo cysts removal"  . Skin graft    . Insertion of mesh  10/2006  . Hernia repair    . Bone marrow biopsy  02/2014  . Cataract extraction w/ intraocular lens  implant, bilateral Bilateral   The above surgical history was reviewed.  Social History: Patient lives with his two daughters, one of whom is autistic.  He does not smoke.  He is independent with all ADLs.    Allergies  Allergen Reactions  .  Morphine And Related Nausea And Vomiting    Family History  Problem Relation Age of Onset  . Hypertension Mother   . Diabetes Mother   . Lymphoma Father     Prior to Admission medications   Medication Sig Start Date End Date Taking? Authorizing Provider  Amylase-Lipase-Protease (PANCREASE PO) Take 2 capsules by mouth 3 (three) times daily with meals.     Historical Provider, MD  aspirin 81 MG tablet Take 81 mg by mouth daily before breakfast.     Historical Provider, MD  Cyanocobalamin (VITAMIN B 12 PO) Take 1 tablet by mouth daily.     Historical Provider, MD  digoxin (LANOXIN) 0.125 MG tablet Take 1 tablet (125 mcg total) by mouth every morning. 02/12/14   Eugenie Filler, MD  folic acid (FOLVITE) 1 MG tablet Take 1 tablet (1 mg total) by mouth daily. 05/17/13   Thurnell Lose, MD  furosemide (LASIX) 40 MG tablet Take 40 mg by mouth 2 (two) times daily.    Historical Provider, MD  gabapentin (NEURONTIN) 400 MG capsule Take 400 mg by mouth 3 (three) times daily.    Historical Provider, MD  glucose monitoring kit (FREESTYLE) monitoring kit 1 each by Does not apply route 4 (four) times daily - after meals and at bedtime. 1 month Diabetic Testing Supplies for QAC-QHS accuchecks.Any brand OK 05/17/13   Thurnell Lose, MD  Nyoka Cowden Tea, Camillia sinensis, (GREEN TEA PO) Take 2 tablets by mouth daily.     Historical Provider, MD  HYDROmorphone (DILAUDID) 2 MG tablet Take 2 mg by mouth every 4 (four) hours as needed for severe pain.    Historical Provider, MD  ibuprofen (ADVIL,MOTRIN) 200 MG tablet Take 400 mg by mouth every 6 (six) hours as needed for headache, mild pain or moderate pain.    Historical Provider, MD  insulin aspart (NOVOLOG) 100 UNIT/ML injection Before each meal 3 times a day and then bedtime, 140-199 - 2 units, 200-250 - 4 units, 251-299 - 6 units,  300-349 - 8 units,  350 or above 10 units. Dispense syringes and needles as needed, Ok to switch to PEN if approved. Okay to switch to any approved short-acting insulin. 05/17/13   Thurnell Lose, MD  insulin glargine (LANTUS) 100 UNIT/ML injection Inject 16 Units into the skin at bedtime.    Historical Provider, MD  lisinopril (PRINIVIL,ZESTRIL) 10 MG tablet Take 1 tablet (10 mg total) by mouth daily. 02/12/14   Eugenie Filler, MD  metoprolol succinate (TOPROL-XL) 50 MG 24 hr tablet Take 1 tablet (50 mg total) by mouth every morning. 02/12/14   Eugenie Filler, MD  Omega-3 Fatty Acids (FISH OIL) 1000 MG CAPS Take 1 capsule by mouth 2 (two) times daily.    Historical Provider, MD  ondansetron (ZOFRAN) 4 MG tablet Take 1 tablet (4 mg total) by mouth every 8 (eight) hours as needed for nausea or  vomiting. 07/26/14   Dahlia Bailiff, PA-C  oxyCODONE 10 MG TABS Take 1 tablet (10 mg total) by mouth every 6 (six) hours as needed for severe pain. 10/06/13   Jerrye Noble, MD  pantoprazole (PROTONIX) 40 MG tablet Take 1 tablet (40 mg total) by mouth 2 (two) times daily. 05/17/13   Thurnell Lose, MD  potassium chloride SA (K-DUR,KLOR-CON) 20 MEQ tablet Take 2 tablets (40 mEq total) by mouth daily. 02/12/14   Eugenie Filler, MD  promethazine (PHENERGAN) 25 MG tablet Take 25 mg by mouth every  8 (eight) hours as needed for nausea or vomiting.    Historical Provider, MD  thiamine 100 MG tablet Take 1 tablet (100 mg total) by mouth daily. 05/17/13   Thurnell Lose, MD  Vitamin D, Ergocalciferol, (DRISDOL) 50000 UNITS CAPS capsule Take 50,000 Units by mouth every 7 (seven) days.    Historical Provider, MD    Physical Exam: BP 107/81 mmHg  Pulse 84  Temp(Src) 99.8 F (37.7 C) (Oral)  Resp 16  SpO2 100% General appearance: Morbidly obese adult male, alert and in moderate distress from pain.  Responds appropriately to questions.   Eyes: Sclerae normal with slight icterus, conjunctiva pink, lids and lashes normal.  PERRL and EOMI.   Nose: No deformity, discharge, or epistaxis.   Mouth: OP moist without erythema, exudates, cobblestoning, or ulcers.  No airway deformities.   Lymph: Not palpable due to body habitus. Skin: Warm and dry.  No jaundice.  No suspicious rashes or lesions. Cardiac: Tachycardic, irregularly irregular, nl S1-S2, no murmurs appreciated, exam limited by habitus.  Capillary refill is less than 2 seconds.  No LE edema.  Radial pulses 2+ and symmetric. Respiratory: Tachypneic.  There are coarse breath sounds with inspiration, best heard over the large airways.  Otherwise, exam is limited by adipose, but I do not hear crackles.   Abdomen: BS present.  Abdomen soft with large scar across midline.  There is LUQ tenderness without rebound.    Neuro: Sensorium intact.  Speech is fluent.   Attention and concentration are normal.  Memory seems intact.  Moves all extremities equally and with normal coordination.        Labs on Admission:  The metabolic panel is notable for normal sodium, potassium, bicarbonate, and renal function. Normal glucose. The digoxin level is low. The transaminases are normal. The indirect bilirubin is slightly elevated. The troponin is elevated at 0.13  The lactic acid level is normal. The lipase is elevated at 191 The complete blood count is notable for normal WBC, hemoglobin, and mild from cytopenia.   Radiological Exams on Admission: Personally reviewed: Dg Chest [facility] 01/01/2015  Apparent congestion, unchanged from previous.   EKG: Independently reviewed. Atrial fibrillation.    Assessment/Plan  1. Acute on chronic pancreatitis:  The patient presents with epigastric and left upper quadrant pain, nausea and vomiting and elevated lipase suggestive of acute on chronic pancreatitis. -Nothing by mouth -Very cautious IV fluids given her failure -Hydromorphone 1 mg every 2 hours when necessary for pain -Advance diet to low residue diet as tolerated  2. Elevated troponin: Suspect this is demand ischemia, as the patient is currently asymptomatic and denies chest pain and his EKG is unchanged from previous. -Trend troponin  2. Atrial fibrillation, chronic:  Stable. -Continue home metoprolol  3. Chronic combined CHF:  This appears to be the patient's baseline and he has no dyspnea. -Continue digoxin, aspirin, metoprolol, lisinopril, potassium -Hold Lasix for now while hydrating pancreatitis  4. Type 2 diabetes, poorly controlled, on insulin:  Stable. -Repeat hemoglobin A1c -Continue home glargine -Sliding scale -Continue home gabapentin  5. MGUS versus multiple myeloma:  This is not in our chart. The patient relates this diagnosis from the [place], and he was last seen in 2013 by our oncology. -Request records from [place].  6.  Hypertension:  Stable.  -Continue home lisinopril and metoprolol      DVT PPx: Lovenox Diet: Nothing by mouth Code Status: Full  Disposition Plan:  At the time of admission, it  appears that the appropriate admission status for this patient is INPATIENT. This is judged to be reasonable and necessary in order to provide the required intensity of service to ensure the patient's safety given the presenting symptoms, physical exam findings, and initial radiographic and laboratory data in the context of their chronic comorbidities.  Together, these circumstances are felt to place her/him at high risk for further clinical deterioration threatening life, limb, or organ.     Edwin Dada [facility] Pager [phone]

## 2015-01-02 NOTE — Progress Notes (Signed)
[facility] PROGRESS NOTE   Don Brown UYQ:[id number] DOB: 15-Oct-1955 DOA: 01/01/2015 PCP: PROVIDER NOT IN SYSTEM  HPI/Subjective: See complaining about 7/10 abdominal pain, still has some nausea.  Assessment/Plan: Active Problems:   Atrial fibrillation (HCC)   Nonischemic cardiomyopathy (HCC)   Pancreatitis, acute   Morbid obesity (HCC)   Chronic combined systolic and diastolic CHF (congestive heart failure) (HCC)   Acute pancreatitis   Diabetes mellitus type 2, controlled ([place])   HTN (hypertension), benign   Patient seen and examined, and data base reviewed. This is no charge note, patient seen earlier today by my colleague Dr. Loleta Books. Working diagnosis acute on chronic pancreatitis, ARF and elevated troponin. Cardiology consulted, hold Lasix, gentle hydration.  Code Status: Full Code Family Communication: Plan discussed with the patient. Disposition Plan: Remains inpatient Diet: Diet NPO time specified Except for: Sips with Meds  Consultants:  Cardiology  Procedures:  None  Antibiotics:  None   Objective: Filed Vitals:   01/02/15 0959  BP: 104/66  Pulse:   Temp:   Resp:     Intake/Output Summary (Last 24 hours) at 01/02/15 1054 Last data filed at 01/02/15 1000  Gross per 24 hour  Intake    410 ml  Output    330 ml  Net     80 ml   Filed Weights   01/02/15 0647  Weight: 154.5 kg (340 lb 9.8 oz)    Exam: General: Alert and awake, oriented x3, not in any acute distress. HEENT: anicteric sclera, pupils reactive to light and accommodation, EOMI CVS: S1-S2 clear, no murmur rubs or gallops Chest: clear to auscultation bilaterally, no wheezing, rales or rhonchi Abdomen: soft nontender, nondistended, normal bowel sounds, no organomegaly Extremities: no cyanosis, clubbing or edema noted bilaterally Neuro: Cranial nerves II-XII intact, no focal neurological deficits  Data Reviewed: Basic Metabolic Panel:  Recent Labs Lab 01/01/15 2150  01/02/15 0323 01/02/15 0324  NA 137 140  --   K 3.6 4.6  --   CL 106 105  --   CO2 22 24  --   GLUCOSE 103* 129*  --   BUN 16 21*  --   CREATININE 1.21 1.76* 1.85*  CALCIUM 8.6* 8.5*  --    Liver Function Tests:  Recent Labs Lab 01/01/15 2221 01/02/15 0323  AST 24 28  ALT 16* 15*  ALKPHOS 62 50  BILITOT 1.4* 0.8  PROT 9.0* 8.1  ALBUMIN 3.3* 2.8*    Recent Labs Lab 01/01/15 2221  LIPASE 191*   No results for input(s): AMMONIA in the last 168 hours. CBC:  Recent Labs Lab 01/01/15 2150 01/02/15 0323  WBC 6.0 11.5*  HGB 13.1 12.2*  HCT 42.1 39.7  MCV 86.6 88.0  PLT 126* 119*   Cardiac Enzymes:  Recent Labs Lab 01/02/15 0448  TROPONINI 0.85*   BNP (last 3 results)  Recent Labs  05/06/14 2240 06/17/14 2314  BNP 130.4* 181.2*    ProBNP (last 3 results)  Recent Labs  02/10/14 0819 02/11/14 0441 02/12/14 0434  PROBNP 829.3* 539.9* 386.4*    CBG:  Recent Labs Lab 01/02/15 0811  GLUCAP 129*    Micro Recent Results (from the past 240 hour(s))  MRSA PCR Screening     Status: None   Collection Time: 01/02/15  6:42 AM  Result Value Ref Range Status   MRSA by PCR NEGATIVE NEGATIVE Final    Comment:        The GeneXpert MRSA Assay (FDA approved for NASAL specimens only),  is one component of a comprehensive MRSA colonization surveillance program. It is not intended to diagnose MRSA infection nor to guide or monitor treatment for MRSA infections.      Studies: Dg Chest Port 1 View  01/01/2015  CLINICAL DATA:  Acute shortness of breath and chest pain. EXAM: PORTABLE CHEST 1 VIEW COMPARISON:  07/26/2014 and prior radiographs FINDINGS: Cardiomegaly, pulmonary vascular congestion and mild bibasilar atelectasis noted. This is a low volume film. There is no evidence of focal airspace disease, pulmonary edema, suspicious pulmonary nodule/mass, pleural effusion, or pneumothorax. No acute bony abnormalities are identified. IMPRESSION:  Cardiomegaly with pulmonary vascular congestion and bibasilar atelectasis. Electronically Signed   By: Margarette Canada M.D.   On: 01/01/2015 22:46    Scheduled Meds: . aspirin  81 mg Oral QAC breakfast  . digoxin  125 mcg Oral BH-q7a  . enoxaparin (LOVENOX) injection  40 mg Subcutaneous Q24H  . gabapentin  400 mg Oral TID  . insulin aspart  0-15 Units Subcutaneous TID WC  . insulin aspart  0-5 Units Subcutaneous QHS  . insulin glargine  16 Units Subcutaneous QHS  . lipase/protease/amylase  12,000 Units Oral TID AC  . lisinopril  10 mg Oral Daily  . metoprolol succinate  50 mg Oral BH-q7a  . pantoprazole  40 mg Oral BID  . potassium chloride SA  40 mEq Oral Daily  . sodium chloride  3 mL Intravenous Q12H   Continuous Infusions: . sodium chloride 75 mL/hr at 01/02/15 0803       Time spent: 35 minutes    [facility]  [facility] Pager [phone] If 7PM-7AM, please contact night-coverage at [email or web address], password [facility] 01/02/2015, 10:54 AM  LOS: 0 days

## 2015-01-02 NOTE — Consult Note (Signed)
CARDIOLOGY CONSULT NOTE   Patient ID: Don Brown MRN: [id number], DOB/AGE: 1955/11/25   Admit date: 01/01/2015 Date of Consult: 01/02/2015 Reason for  Consult: Elevated Troponin  Primary Physician: PROVIDER NOT IN SYSTEM Primary Cardiologist: [place] - No specific cardiologist; Dr. Martinique in 2014  HPI: Don Brown is a 58 y.o. male with PMH of nonischemic cardiomyopathy (EF 20-25% in 04/2013) , chronic combined systolic and diastolic CHF, chronic pancreatitis, OSA, Type 2 DM, and Chronic Atrial Fibrillation (not on anticoagulation) who presented to [facility] ED on 01/01/2015 for chest pain, shortness of breath, and abdominal pain. Admitted for acute on chronic pancreatitis.  Patient reports he developed intense epigastric pain on 01/01/2015 which was accompanied by nausea and vomiting. He notes the pain was his "typical pancreatic" pain and this occurs frequently.   When asked about any chest pain, he denies any recent pain. He reports some chest tightness that occurs randomly when sitting or lying down but has not noted a pattern to the pain and denies any pain in the recent weeks or while admitted. He reports being on 2L O2 PRN while at home and notes he has to use this more frequently when being active. However, he notes decreased physical activity over the past several months due to the presence of abdominal hernias and says he is waiting to receive an electronic scooter from the [place]. He currently uses a walker at home.  He reports taking Lasix daily. He does not weigh himself due to "not having batteries in the scale" and is unsure of what his weight has been over the past several months. Weight was 302 lbs at discharge in 04/2014 but patient notes he has gained weight since then due to inactivity. He endorses occasional orthopnea. Notes edema at times, but reports his current Lasix dose "takes care of it".  Has been known to have chronic atrial fibrillation. When examined  in consult in 01/2014 it was noted he had significant medication noncompliance and providers were unsure what would be the best anticoagulation for him due to the noncompliance. The decision was deferred to the [place] and it appears the patient only takes an 20m ASA daily.  Significant labs include lipase found to be elevated to 191. Initial POC-troponin was 0.13. Troponin I is 0.85. Cyclic enzymes are pending. EKG showing more prominent T-wave inversion in V4, V5, and V6.  Problem List Past Medical History  Diagnosis Date  . 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 Atrial fibrillation ([place]   . Chronic systolic heart failure (HCC)     LVEF 40-45%  . Morbid obesity ([place]   . Chronic pain   . Narcotic dependence, episodic use ([place]   . Respiratory failure ([place]     History of ventilatory-dependent respiratory failure and tracheostomy   . History of renal failure     Requiring hemodialysis in the past with normal renal function at this point  . History of ventral hernia repair   . Cervical compression fracture (HCC)     Cervical disk disease requiring surgery  . Essential hypertension, benign   . Hyperlipidemia   . Degenerative joint disease   . Heart murmur   . MGUS (monoclonal gammopathy of unknown significance)     "or myelona" (05/07/2014)  . On home oxygen therapy     "2L prn" (05/07/2014)  . CHF (congestive heart failure) (  [place])   . Mitral valve prolapse   . Sleep apnea     "don't wear mask" (05/07/2014)  . Type 2 diabetes mellitus ([place])   . Anemia   . History of blood transfusion 2007    "related to OR"  . Headache     "stress"  . Anxiety   . Depression   . Myeloma ([place])     "or MGUS" (05/07/2014)    Past Surgical History  Procedure Laterality Date  . Ventral hernia repair    . Cholecystectomy    . Tracheostomy  2007  . Pancreas surgery       "placed stents; multiple pseudo cysts removal"  . Skin graft    . Insertion of mesh  10/2006  . Hernia repair    . Bone marrow biopsy  02/2014  . Cataract extraction w/ intraocular lens  implant, bilateral Bilateral      Allergies Allergies  Allergen Reactions  . Morphine And Related Nausea And Vomiting      Inpatient Medications . aspirin  81 mg Oral QAC breakfast  . digoxin  125 mcg Oral BH-q7a  . enoxaparin (LOVENOX) injection  40 mg Subcutaneous Q24H  . gabapentin  400 mg Oral TID  . insulin aspart  0-15 Units Subcutaneous TID WC  . insulin aspart  0-5 Units Subcutaneous QHS  . insulin glargine  16 Units Subcutaneous QHS  . lipase/protease/amylase  12,000 Units Oral TID AC  . metoprolol succinate  50 mg Oral BH-q7a  . pantoprazole  40 mg Oral BID  . potassium chloride SA  40 mEq Oral Daily  . sodium chloride  3 mL Intravenous Q12H    Family History Family History  Problem Relation Age of Onset  . Hypertension Mother   . Diabetes Mother   . Lymphoma Father      Social History Social History   Social History  . Marital Status: Single    Spouse Name: N/A  . Number of Children: N/A  . Years of Education: N/A   Occupational History  . Not on file.   Social History Main Topics  . Smoking status: Never Smoker   . Smokeless tobacco: Never Used  . Alcohol Use: No  . Drug Use: No  . Sexual Activity: Not Currently   Other Topics Concern  . Not on file   Social History Narrative     Review of Systems General:  No chills, fever, night sweats or weight changes.  Cardiovascular:  No chest pain, palpitations, paroxysmal nocturnal dyspnea. Positive for dyspnea on exertion, edema, orthopnea Dermatological: No rash, lesions/masses Respiratory: Positive for cough and dyspnea Urologic: No hematuria, dysuria Abdominal:   No diarrhea, bright red blood per rectum, melena, or hematemesis. Positive for nausea, vomiting and abdominal pain. Neurologic:  No visual  changes, wkns, changes in mental status. All other systems reviewed and are otherwise negative except as noted above.  Physical Exam: Blood pressure 104/66, pulse 79, temperature 98.5 F (36.9 C), temperature source Oral, resp. rate 24, height '5\' 11"'  (1.803 m), weight 340 lb 9.8 oz (154.5 kg), SpO2 97 %.  General: Obese male appearing in no acute distress. Psych: Normal affect. Neuro: Alert and oriented X 3. Moves all extremities spontaneously. HEENT: Normal. On [place].  Neck: Supple without bruits. JVD unable to be assessed due to body habitus. Lungs:  Resp regular and unlabored, CTA without wheezing or rales. Heart: Irregularly irregular, no s3, s4, or murmurs. Abdomen: Soft, tender to palpation along left upper quadrant, BS+  x 4.  Extremities: No clubbing, cyanosis or edema. DP/PT/Radials 2+ and equal bilaterally.  Labs  Recent Labs  01/02/15 0448  TROPONINI 0.85*   Lab Results  Component Value Date   WBC 11.5* 01/02/2015   HGB 12.2* 01/02/2015   HCT 39.7 01/02/2015   MCV 88.0 01/02/2015   PLT 119* 01/02/2015    Recent Labs Lab 01/02/15 0323 01/02/15 0324  NA 140  --   K 4.6  --   CL 105  --   CO2 24  --   BUN 21*  --   CREATININE 1.76* 1.85*  CALCIUM 8.5*  --   PROT 8.1  --   BILITOT 0.8  --   ALKPHOS 50  --   ALT 15*  --   AST 28  --   GLUCOSE 129*  --   No results found for: DDIMER  Radiology/Studies Dg Chest Port 1 View: 01/01/2015  CLINICAL DATA:  Acute shortness of breath and chest pain. EXAM: PORTABLE CHEST 1 VIEW COMPARISON:  07/26/2014 and prior radiographs FINDINGS: Cardiomegaly, pulmonary vascular congestion and mild bibasilar atelectasis noted. This is a low volume film. There is no evidence of focal airspace disease, pulmonary edema, suspicious pulmonary nodule/mass, pleural effusion, or pneumothorax. No acute bony abnormalities are identified. IMPRESSION: Cardiomegaly with pulmonary vascular congestion and bibasilar atelectasis. Electronically Signed    By: Margarette Canada M.D.   On: 01/01/2015 22:46    ECG: Atrial fibrillation with rate in 110's. More prominent T-wave inversion in V4, V5, and V6 compared to previous tracings.   ECHOCARDIOGRAM: 05/01/2013 Study Conclusions - Left ventricle: The cavity size was severely dilated. Wall thickness was increased in a pattern of mild LVH. Systolic function was severely reduced. The estimated ejection fraction was in the range of 20% to 25%. Diffuse hypokinesis. - Right atrium: The atrium was mildly dilated.  Left ventricle: The cavity size was severely dilated. Wall thickness was increased in a pattern of mild LVH. Systolic function was severely reduced. The estimated ejection fraction was in the range of 20% to 25%. Diffuse hypokinesis. The study was not technically sufficient to allow evaluation of LV diastolic dysfunction due to atrial fibrillation.  Aortic valve:  Structurally normal valve. Trileaflet. Cusp separation was normal. Doppler: Transvalvular velocity was within the normal range. There was no stenosis. No regurgitation.  Aorta: Ascending aorta: The ascending aorta was mildly dilated.  Mitral valve:  Structurally normal valve.  Leaflet separation was normal. Doppler: Transvalvular velocity was within the normal range. There was no evidence for stenosis. No regurgitation.  Left atrium: The atrium was at the upper limits of normal in size.  Right ventricle: The cavity size was normal. Wall thickness was normal. Systolic function was normal.  Pulmonic valve:  Structurally normal valve.  Cusp separation was normal. Doppler: Transvalvular velocity was within the normal range. No regurgitation.  Tricuspid valve:  Normal thickness leaflets. Doppler: Mild regurgitation.  Right atrium: The atrium was mildly dilated.  Pericardium: There was no pericardial effusion.  ASSESSMENT AND PLAN  1. Elevated Troponin - denies any recent chest  pain. Notes dyspnea on exertion that has increased over the past several months the patient equates with his deconditioning.  - Initial POC-troponin was 0.13. Troponin I is 0.85. Cyclic enzymes are pending.  - EKG showing more prominent T-wave inversion in V4, V5, and V6 when compared to previous tracings - patient is unsure if he has ever had a cardiac catheterization or stress testing. Reports "If I did, it was  not recent". No procedures noted in EPIC. - Recommended NST, which would need to be a 2-day study due to patient's weight.  2. Nonischemic Cardiomyopathy with Chronic Combined Systolic and Diastolic CHF - unsure of baseline weight, for he does not currently weigh himself daily.  - weight at discharge in 04/2014 was 302lbs, but he notes having gained weight since then. - denies any recent worsening dyspnea or edema. Breathing is at baseline. Notes "increased abdominal girth" secondary to hernias. - does not appear to be in acute heart failure on examination, however exam is technically difficult due to body habitus. - Continue Digoxin, ASA, BB, ACE-I - Holding Lasix due to hydration for pancreatitis. - will need to continue to be conservative with IVF for pancreatitis to avoid acute CHF exacerbation.  3. Chronic Atrial Fibrillation - rate controlled in 80's - 90's. - continue Digoxin and BB - This patients CHA2DS2-VASc Score and unadjusted Ischemic Stroke Rate (% per year) is equal to at least  3.2 % stroke rate/year from a score of 3 (CHF, HTN, DM). - has not been on anticoagulation due to medication noncompliance. Continue 76m ASA for now.  4. HTN - BP has been 86/47 - 128/99 in the past 24 hours. - continue current medication regimen  5. Type 2 DM - on Lantus and SSI currently - managed by admitting team  6. Acute on Chronic Pancreatitis - NPO currently. IVF at 713mhr for now. - managed by admitting team  Signed, BrErma HeritagePA-C 01/02/2015, 11:17 AM Pager:  [phone] with note by BrGuinevere ScarletATSP with NSTEMI, CP. Trop increased to .85 with new ST depression and TWI. H/O NISCM. Other probs as outlined with CAF not on OASycamoreecondary to medication noncompliance. On lasix at home on hold. Getting IVF for pancreatitis. SCr increased from baseline. I discussed need for 2 day Lexiscan MV but pt declined any testing. Will S/O. Call if pt changes his mind or if we can be of further assistance.   JoLorretta HarpM.D., FABronwoodFAMemorial Medical CenterFALaverta BaltimoreSPalmview South[address]Su[place]ando[place]  [phone]/02/2015 1:05 PM

## 2015-01-02 NOTE — ED Notes (Signed)
Pt is holding for a stepdown bed.

## 2015-01-02 NOTE — Progress Notes (Signed)
Patient is refusing BIPAP. Patient pulled mask off. MD was notified that patient does not want to wear BIPAP any longer. RT will continue to monitor as needed.

## 2015-01-03 LAB — URINALYSIS, ROUTINE W REFLEX MICROSCOPIC
Bilirubin Urine: NEGATIVE
Glucose, UA: NEGATIVE mg/dL
Hgb urine dipstick: NEGATIVE
Ketones, ur: NEGATIVE mg/dL
Leukocytes, UA: NEGATIVE
Nitrite: NEGATIVE
Protein, ur: 30 mg/dL — AB
Specific Gravity, Urine: 1.022 (ref 1.005–1.030)
Urobilinogen, UA: 1 mg/dL (ref 0.0–1.0)
pH: 5 (ref 5.0–8.0)

## 2015-01-03 LAB — RENAL FUNCTION PANEL
ALBUMIN: 2.8 g/dL — AB (ref 3.5–5.0)
Anion gap: 8 (ref 5–15)
BUN: 30 mg/dL — AB (ref 6–20)
CHLORIDE: 109 mmol/L (ref 101–111)
CO2: 24 mmol/L (ref 22–32)
CREATININE: 1.58 mg/dL — AB (ref 0.61–1.24)
Calcium: 8.5 mg/dL — ABNORMAL LOW (ref 8.9–10.3)
GFR calc Af Amer: 54 mL/min — ABNORMAL LOW (ref >60)
GFR, EST NON AFRICAN AMERICAN: 46 mL/min — AB (ref >60)
GLUCOSE: 110 mg/dL — AB (ref 65–99)
POTASSIUM: 5.1 mmol/L (ref 3.5–5.1)
Phosphorus: 3.1 mg/dL (ref 2.5–4.6)
Sodium: 141 mmol/L (ref 135–145)

## 2015-01-03 LAB — GLUCOSE, CAPILLARY
GLUCOSE-CAPILLARY: 135 mg/dL — AB (ref 65–99)
GLUCOSE-CAPILLARY: 143 mg/dL — AB (ref 65–99)
GLUCOSE-CAPILLARY: 126 mg/dL — AB (ref 65–99)
GLUCOSE-CAPILLARY: 157 mg/dL — AB (ref 65–99)

## 2015-01-03 LAB — LIPASE, BLOOD: Lipase: 65 U/L — ABNORMAL HIGH (ref 22–51)

## 2015-01-03 LAB — URINE MICROSCOPIC-ADD ON

## 2015-01-03 LAB — HEMOGLOBIN A1C
Hgb A1c MFr Bld: 7.7 % — ABNORMAL HIGH (ref 4.8–5.6)
MEAN PLASMA GLUCOSE: 174 mg/dL

## 2015-01-03 LAB — TROPONIN I: TROPONIN I: 0.45 ng/mL — AB (ref <0.031)

## 2015-01-03 MED ORDER — PANTOPRAZOLE SODIUM 40 MG IV SOLR
40.0000 mg | Freq: Once | INTRAVENOUS | Status: AC
  Administered 2015-01-03: 40 mg via INTRAVENOUS
  Filled 2015-01-03: qty 40

## 2015-01-03 MED ORDER — MAGIC MOUTHWASH
5.0000 mL | Freq: Three times a day (TID) | ORAL | Status: DC | PRN
  Filled 2015-01-03 (×2): qty 5

## 2015-01-03 NOTE — Progress Notes (Signed)
Call rec'd for CT requesting patient. Patient refused to CT done. Attempted to redirect patient and educated the process of the CT scan ordered specifically for this patient. Unable to redirect. Patient continued to refuse.

## 2015-01-03 NOTE — Care Management Note (Addendum)
Case Management Note  Patient Details  Name: Don Brown MRN: [id number] Date of Birth: 1955-04-19  Subjective/Objective:   Received request to talk with pt about services he was dissatisfied with, pt states he receives primary care @ [facility] and that he was to receive "occupational therapy" for a year and was not receiving services.  Pt asked CM to call social worker who was to arrange services, Anderson Malta, @ [phone], ext [phone].  Per Anderson Malta, pt had been receiving CNA services 2 hrs/day, 6 days/wk until June when agency discharged pt.  Anderson Malta does not know why pt was discharged, plans to call agency to determine.  Transfer Coordinator @ [place] notified of pt's admission.                          Expected Discharge Plan:  Home/Self Care  Discharge planning Services  CM Consult  Status of Service:  In process, will continue to follow  MayoKym Groom, RN 01/03/2015, 1:43 PM  04/04/14 3:00 PM:  RECEIVED TC FROM JENNIFER @ [facility] HE WAS ATTEMPTING TO GET THEM TO DO HOUSEKEEPING AS WELL AS PERSONAL CARE.  JENNIFER STATES SHE WILL FIND ANOTHER AGENCY THAT WILL PROVIDE CNA FOR PERSONAL CARE FOR PT 2 HRS/DAY, 6 DAYS/WK.

## 2015-01-03 NOTE — Progress Notes (Signed)
[facility] PROGRESS NOTE   Don Brown YYT:[id number] DOB: 07-31-1955 DOA: 01/01/2015 PCP: PROVIDER NOT IN SYSTEM  HPI/Subjective: Complaining about severe headache, denies any nausea or abdominal pain. Refused further cardiac testing yesterday, cardiology signed off.  Assessment/Plan: Active Problems:   Atrial fibrillation (HCC)   Nonischemic cardiomyopathy (HCC)   Pancreatitis, acute   Morbid obesity (HCC)   Chronic combined systolic and diastolic CHF (congestive heart failure) (HCC)   Acute pancreatitis   Diabetes mellitus type 2, controlled (HCC)   HTN (hypertension), benign    Abdominal pain Likely secondary to acute on chronic pancreatitis, patient came in with nausea, vomiting and epigastric abdominal pain. Patient has history of chronic pancreatitis with occasional exacerbations. Presented with elevated lipase of 191. Ordered CT with out IV contrast, patient refusing test saying metformin he took at home 3 days ago will interact with the oral contrast. Abdominal pain is better today he is complaining about headache. Continue nothing by mouth status and gentle IV fluid hydration.  SIRS Patient developed fever of 101.8 and respiratory rate of 35 with blood pressure of 90/58. Description be secondary to acute pancreatitis versus infectious condition. Urinalysis, urine cultures obtained. Chest x-ray on admission did not show any infiltrates. Started on antibiotics empirically.  Non-STEMI Troponin peak of 0.85, trending down, patient denies any chest pain. Seen by cardiology and recommended Myoview stress test. Patient refused testing, cardiology signed off. Continue beta blockers and low-dose aspirin.  Atrial fibrillation Rate is controlled with beta blockers. On aspirin 81 mg because of history of medication noncompliance. This patients CHA2DS2-VASc Score and unadjusted Ischemic Stroke Rate (% per year) is equal to 3.2 % stroke rate/year from a score of 3  for CHF, HTN and DM.  Diabetes mellitus type 2 Uncontrolled diabetes mellitus with hemoglobin A1c of 7.7, much better than A1c in February which was 9.7. His insulin regimen held because of low hemoglobin.   Code Status: Full Code Family Communication: Plan discussed with the patient. Disposition Plan: Remains inpatient Diet: Diet NPO time specified Except for: Sips with Meds  Consultants:  Cardiology  Procedures:  None  Antibiotics:  None   Objective: Filed Vitals:   01/03/15 0900  BP: 146/73  Pulse: 94  Temp:   Resp: 11    Intake/Output Summary (Last 24 hours) at 01/03/15 1032 Last data filed at 01/03/15 0900  Gross per 24 hour  Intake    900 ml  Output    350 ml  Net    550 ml   Filed Weights   01/02/15 0647 01/03/15 0437  Weight: 154.5 kg (340 lb 9.8 oz) 154.6 kg (340 lb 13.3 oz)    Exam: General: Alert and awake, oriented x3, not in any acute distress. HEENT: anicteric sclera, pupils reactive to light and accommodation, EOMI CVS: S1-S2 clear, no murmur rubs or gallops Chest: clear to auscultation bilaterally, no wheezing, rales or rhonchi Abdomen: soft nontender, nondistended, normal bowel sounds, no organomegaly Extremities: no cyanosis, clubbing or edema noted bilaterally Neuro: Cranial nerves II-XII intact, no focal neurological deficits  Data Reviewed: Basic Metabolic Panel:  Recent Labs Lab 01/01/15 2150 01/02/15 0323 01/02/15 0324 01/03/15 0314  NA 137 140  --  141  K 3.6 4.6  --  5.1  CL 106 105  --  109  CO2 22 24  --  24  GLUCOSE 103* 129*  --  110*  BUN 16 21*  --  30*  CREATININE 1.21 1.76* 1.85* 1.58*  CALCIUM 8.6* 8.5*  --  8.5*  PHOS  --   --   --  3.1   Liver Function Tests:  Recent Labs Lab 01/01/15 2221 01/02/15 0323 01/03/15 0314  AST 24 28  --   ALT 16* 15*  --   ALKPHOS 62 50  --   BILITOT 1.4* 0.8  --   PROT 9.0* 8.1  --   ALBUMIN 3.3* 2.8* 2.8*    Recent Labs Lab 01/01/15 2221 01/03/15 0314  LIPASE  191* 65*   No results for input(s): AMMONIA in the last 168 hours. CBC:  Recent Labs Lab 01/01/15 2150 01/02/15 0323  WBC 6.0 11.5*  HGB 13.1 12.2*  HCT 42.1 39.7  MCV 86.6 88.0  PLT 126* 119*   Cardiac Enzymes:  Recent Labs Lab 01/02/15 0448 01/02/15 1110 01/02/15 1655 01/02/15 2329  TROPONINI 0.85* 0.83* 0.58* 0.45*   BNP (last 3 results)  Recent Labs  05/06/14 2240 06/17/14 2314  BNP 130.4* 181.2*    ProBNP (last 3 results)  Recent Labs  02/10/14 0819 02/11/14 0441 02/12/14 0434  PROBNP 829.3* 539.9* 386.4*    CBG:  Recent Labs Lab 01/02/15 0811 01/02/15 1223 01/02/15 1628 01/02/15 2216  GLUCAP 129* 91 98 122*    Micro Recent Results (from the past 240 hour(s))  MRSA PCR Screening     Status: None   Collection Time: 01/02/15  6:42 AM  Result Value Ref Range Status   MRSA by PCR NEGATIVE NEGATIVE Final    Comment:        The GeneXpert MRSA Assay (FDA approved for NASAL specimens only), is one component of a comprehensive MRSA colonization surveillance program. It is not intended to diagnose MRSA infection nor to guide or monitor treatment for MRSA infections.   Culture, blood (routine x 2)     Status: None (Preliminary result)   Collection Time: 01/02/15 11:05 PM  Result Value Ref Range Status   Specimen Description BLOOD LEFT HAND  Final   Special Requests AEROBIC BOTTLE ONLY 7ML  Final   Culture PENDING  Incomplete   Report Status PENDING  Incomplete  Culture, blood (routine x 2)     Status: None (Preliminary result)   Collection Time: 01/02/15 11:20 PM  Result Value Ref Range Status   Specimen Description BLOOD RIGHT HAND  Final   Special Requests AEROBIC BOTTLE ONLY 5ML  Final   Culture PENDING  Incomplete   Report Status PENDING  Incomplete     Studies: Dg Chest Port 1 View  01/01/2015  CLINICAL DATA:  Acute shortness of breath and chest pain. EXAM: PORTABLE CHEST 1 VIEW COMPARISON:  07/26/2014 and prior radiographs  FINDINGS: Cardiomegaly, pulmonary vascular congestion and mild bibasilar atelectasis noted. This is a low volume film. There is no evidence of focal airspace disease, pulmonary edema, suspicious pulmonary nodule/mass, pleural effusion, or pneumothorax. No acute bony abnormalities are identified. IMPRESSION: Cardiomegaly with pulmonary vascular congestion and bibasilar atelectasis. Electronically Signed   By: Margarette Canada M.D.   On: 01/01/2015 22:46    Scheduled Meds: . aspirin  81 mg Oral QAC breakfast  . digoxin  125 mcg Oral BH-q7a  . enoxaparin (LOVENOX) injection  40 mg Subcutaneous Q24H  . gabapentin  600 mg Oral TID  . insulin aspart  0-15 Units Subcutaneous TID WC  . lipase/protease/amylase  12,000 Units Oral TID AC  . metoprolol succinate  50 mg Oral BH-q7a  . pantoprazole  40 mg Oral BID  . potassium chloride SA  40 mEq Oral Daily  .  sodium chloride  3 mL Intravenous Q12H   Continuous Infusions: . dextrose 5 % and 0.9% NaCl 75 mL/hr at 01/02/15 1251       Time spent: 35 minutes    [facility] A  [facility] Pager [phone] If 7PM-7AM, please contact night-coverage at [email or web address], password [facility] 01/03/2015, 10:32 AM  LOS: 1 day

## 2015-01-03 NOTE — Progress Notes (Signed)
Pt still refusing CT scan of abdomen and pelvis with contrast despite of the education and explanation being done.

## 2015-01-04 LAB — RENAL FUNCTION PANEL
ALBUMIN: 3.1 g/dL — AB (ref 3.5–5.0)
ANION GAP: 8 (ref 5–15)
BUN: 24 mg/dL — ABNORMAL HIGH (ref 6–20)
CO2: 24 mmol/L (ref 22–32)
CREATININE: 1.16 mg/dL (ref 0.61–1.24)
Calcium: 8.5 mg/dL — ABNORMAL LOW (ref 8.9–10.3)
Chloride: 104 mmol/L (ref 101–111)
GFR calc non Af Amer: >60 mL/min (ref >60)
Glucose, Bld: 191 mg/dL — ABNORMAL HIGH (ref 65–99)
Phosphorus: 3.1 mg/dL (ref 2.5–4.6)
Potassium: 5.3 mmol/L — ABNORMAL HIGH (ref 3.5–5.1)
SODIUM: 136 mmol/L (ref 135–145)

## 2015-01-04 LAB — TROPONIN I
TROPONIN I: 0.12 ng/mL — AB (ref <0.031)
TROPONIN I: 0.1 ng/mL — AB (ref <0.031)

## 2015-01-04 LAB — GLUCOSE, CAPILLARY
GLUCOSE-CAPILLARY: 160 mg/dL — AB (ref 65–99)
GLUCOSE-CAPILLARY: 169 mg/dL — AB (ref 65–99)
Glucose-Capillary: 162 mg/dL — ABNORMAL HIGH (ref 65–99)
Glucose-Capillary: 137 mg/dL — ABNORMAL HIGH (ref 65–99)

## 2015-01-04 MED ORDER — NITROGLYCERIN 0.4 MG SL SUBL
SUBLINGUAL_TABLET | SUBLINGUAL | Status: AC
  Administered 2015-01-04: 0.4 mg
  Filled 2015-01-04: qty 1

## 2015-01-04 MED ORDER — GABAPENTIN 100 MG PO CAPS
100.0000 mg | ORAL_CAPSULE | Freq: Three times a day (TID) | ORAL | Status: DC
  Administered 2015-01-04 – 2015-01-08 (×12): 100 mg via ORAL
  Filled 2015-01-04 (×13): qty 1

## 2015-01-04 MED ORDER — AZITHROMYCIN 500 MG IV SOLR
500.0000 mg | INTRAVENOUS | Status: DC
  Administered 2015-01-04 – 2015-01-07 (×4): 500 mg via INTRAVENOUS
  Filled 2015-01-04 (×5): qty 500

## 2015-01-04 MED ORDER — SODIUM POLYSTYRENE SULFONATE 15 GM/60ML PO SUSP
15.0000 g | Freq: Once | ORAL | Status: AC
  Administered 2015-01-04: 15 g via ORAL
  Filled 2015-01-04: qty 60

## 2015-01-04 MED ORDER — DEXTROSE 5 % IV SOLN
1.0000 g | INTRAVENOUS | Status: DC
  Administered 2015-01-04 – 2015-01-07 (×4): 1 g via INTRAVENOUS
  Filled 2015-01-04 (×5): qty 10

## 2015-01-04 MED ORDER — LISINOPRIL 10 MG PO TABS
10.0000 mg | ORAL_TABLET | Freq: Every day | ORAL | Status: DC
  Administered 2015-01-05 – 2015-01-06 (×2): 10 mg via ORAL
  Filled 2015-01-04 (×3): qty 1

## 2015-01-04 MED ORDER — OXYCODONE-ACETAMINOPHEN 5-325 MG PO TABS
1.0000 | ORAL_TABLET | Freq: Four times a day (QID) | ORAL | Status: DC | PRN
  Administered 2015-01-04 – 2015-01-07 (×8): 2 via ORAL
  Administered 2015-01-07: 1 via ORAL
  Filled 2015-01-04 (×5): qty 2
  Filled 2015-01-04: qty 1
  Filled 2015-01-04 (×6): qty 2
  Filled 2015-01-04: qty 1

## 2015-01-04 MED ORDER — BIOTENE DRY MOUTH MT LIQD: 15.0000 mL | OROMUCOSAL | Status: DC | PRN

## 2015-01-04 MED ORDER — HYDRALAZINE HCL 20 MG/ML IJ SOLN
10.0000 mg | Freq: Four times a day (QID) | INTRAMUSCULAR | Status: DC | PRN
Start: 2015-01-04 — End: 2015-01-08
  Administered 2015-01-04 (×2): 10 mg via INTRAVENOUS
  Filled 2015-01-04 (×3): qty 1

## 2015-01-04 MED ORDER — HYDROMORPHONE HCL 1 MG/ML IJ SOLN
1.0000 mg | INTRAMUSCULAR | Status: DC | PRN
  Administered 2015-01-04 – 2015-01-08 (×16): 1 mg via INTRAVENOUS
  Filled 2015-01-04 (×18): qty 1

## 2015-01-04 MED ORDER — METOPROLOL SUCCINATE ER 25 MG PO TB24
25.0000 mg | ORAL_TABLET | ORAL | Status: DC
  Administered 2015-01-04 – 2015-01-08 (×4): 25 mg via ORAL
  Filled 2015-01-04 (×4): qty 1

## 2015-01-04 NOTE — Progress Notes (Signed)
Pt still refusing CT scan since this morning but he is okay to go for MRI ,MD made aware ,pt kept asking about pain medication but explained to him that he his heart rate is low and he kept desating , he is refusing to use his nasal cannula even if he was given explanation and education he still kept on taking it off,he also been refusing his Lovenox [place] and mentioned it with Dr. [place] Poli.

## 2015-01-04 NOTE — Progress Notes (Addendum)
Pt is refusing CT scan without contrast. Pt stated " I am too tired. Let me sleep. I will may do it in the morning".

## 2015-01-04 NOTE — Progress Notes (Signed)
[facility] PROGRESS NOTE   Don Brown GGY:[id number] DOB: 1955/11/04 DOA: 01/01/2015 PCP: PROVIDER NOT IN SYSTEM  HPI/Subjective: Complaining about 10/10 pain in his head and 8/10 pain in his abdomen. Does not keep eye contact, refused CT scan of abdomen for 3 days in a row, I asked him to do MRI he says he will see. Refused previous testing from cardiology although he has positive troponins. I explained to him, he cannot just be sitting and asking for pain medications we need to know what is going on with him. Previous notes reviewed, has history of noncompliance with medical therapy/providers.  Assessment/Plan: Active Problems:   Atrial fibrillation (HCC)   Nonischemic cardiomyopathy (HCC)   Pancreatitis, acute   Morbid obesity (HCC)   Chronic combined systolic and diastolic CHF (congestive heart failure) (HCC)   Acute pancreatitis   Diabetes mellitus type 2, controlled ([place])   HTN (hypertension), benign    Acute on chronic pancreatitis, likely Abdominal pain likely secondary to acute on chronic pancreatitis, patient came in with nausea, vomiting and epigastric abdominal pain. Patient has history of chronic pancreatitis with occasional exacerbations. Presented with elevated lipase of 191. Ordered CT without IV contrast, patient refusing testing. Abdominal pain is better today he is complaining about headache. Continue NPO status and gentle IV fluid hydration, eyes history complaining about abdominal pain.  SIRS Patient developed fever of 101.8 and respiratory rate of 35 with blood pressure of 90/58. Description be secondary to acute pancreatitis versus infectious condition. Urinalysis, urine cultures obtained. Chest x-ray on admission did not show any infiltrates. Started on antibiotics empirically.  Non-STEMI Troponin peak of 0.85, trending down, patient denies any chest pain. Seen by cardiology and recommended Myoview stress test. Patient refused testing,  cardiology signed off. Continue beta blockers and low-dose aspirin.  Atrial fibrillation Rate is controlled with beta blockers. On aspirin 81 mg because of history of medication noncompliance. This patients CHA2DS2-VASc Score and unadjusted Ischemic Stroke Rate (% per year) is equal to 3.2 % stroke rate/year from a score of 3 for CHF, HTN and DM.  Diabetes mellitus type 2 Uncontrolled diabetes mellitus with hemoglobin A1c of 7.7, much better than A1c in February which was 9.7. His insulin regimen held because of low hemoglobin.  Headache Unclear etiology, but patient has atrial fibrillation and not on anticoagulation. I'll obtain MRI of the brain.  Hyperkalemia Mild hyperkalemia with potassium is 5.3, 1 dose of Kayexalate.  Acute renal failure Baseline creatinine is 1.2 from October 11, creatinine increased to 1.7. This is likely secondary to dehydration, held Lasix and kept trickle IV fluids @50  mL/hour, creatinine improved to 1.1.   Code Status: Full Code Family Communication: Plan discussed with the patient. Disposition Plan: Remains inpatient Diet: Diet NPO time specified Except for: Sips with Meds  Consultants:  Cardiology  Procedures:  None  Antibiotics:  None   Objective: Filed Vitals:   01/04/15 0854  BP: 162/82  Pulse: 67  Temp: 97.5 F (36.4 C)  Resp: 15    Intake/Output Summary (Last 24 hours) at 01/04/15 1057 Last data filed at 01/04/15 0700  Gross per 24 hour  Intake 1150.83 ml  Output   1325 ml  Net -174.17 ml   Filed Weights   01/02/15 0647 01/03/15 0437  Weight: 154.5 kg (340 lb 9.8 oz) 154.6 kg (340 lb 13.3 oz)    Exam: General: Alert and awake, oriented x3, not in any acute distress. HEENT: anicteric sclera, pupils reactive to light and accommodation, EOMI CVS: S1-S2  clear, no murmur rubs or gallops Chest: clear to auscultation bilaterally, no wheezing, rales or rhonchi Abdomen: soft nontender, nondistended, normal bowel sounds,  no organomegaly Extremities: no cyanosis, clubbing or edema noted bilaterally Neuro: Cranial nerves II-XII intact, no focal neurological deficits  Data Reviewed: Basic Metabolic Panel:  Recent Labs Lab 01/01/15 2150 01/02/15 0323 01/02/15 0324 01/03/15 0314 01/04/15 0248  NA 137 140  --  141 136  K 3.6 4.6  --  5.1 5.3*  CL 106 105  --  109 104  CO2 22 24  --  24 24  GLUCOSE 103* 129*  --  110* 191*  BUN 16 21*  --  30* 24*  CREATININE 1.21 1.76* 1.85* 1.58* 1.16  CALCIUM 8.6* 8.5*  --  8.5* 8.5*  PHOS  --   --   --  3.1 3.1   Liver Function Tests:  Recent Labs Lab 01/01/15 2221 01/02/15 0323 01/03/15 0314 01/04/15 0248  AST 24 28  --   --   ALT 16* 15*  --   --   ALKPHOS 62 50  --   --   BILITOT 1.4* 0.8  --   --   PROT 9.0* 8.1  --   --   ALBUMIN 3.3* 2.8* 2.8* 3.1*    Recent Labs Lab 01/01/15 2221 01/03/15 0314  LIPASE 191* 65*   No results for input(s): AMMONIA in the last 168 hours. CBC:  Recent Labs Lab 01/01/15 2150 01/02/15 0323  WBC 6.0 11.5*  HGB 13.1 12.2*  HCT 42.1 39.7  MCV 86.6 88.0  PLT 126* 119*   Cardiac Enzymes:  Recent Labs Lab 01/02/15 0448 01/02/15 1110 01/02/15 1655 01/02/15 2329  TROPONINI 0.85* 0.83* 0.58* 0.45*   BNP (last 3 results)  Recent Labs  05/06/14 2240 06/17/14 2314  BNP 130.4* 181.2*    ProBNP (last 3 results)  Recent Labs  02/10/14 0819 02/11/14 0441 02/12/14 0434  PROBNP 829.3* 539.9* 386.4*    CBG:  Recent Labs Lab 01/03/15 0757 01/03/15 1229 01/03/15 1617 01/03/15 2155 01/04/15 0855  GLUCAP 135* 143* 126* 157* 160*    Micro Recent Results (from the past 240 hour(s))  MRSA PCR Screening     Status: None   Collection Time: 01/02/15  6:42 AM  Result Value Ref Range Status   MRSA by PCR NEGATIVE NEGATIVE Final    Comment:        The GeneXpert MRSA Assay (FDA approved for NASAL specimens only), is one component of a comprehensive MRSA colonization surveillance program. It  is not intended to diagnose MRSA infection nor to guide or monitor treatment for MRSA infections.   Culture, blood (routine x 2)     Status: None (Preliminary result)   Collection Time: 01/02/15 11:05 PM  Result Value Ref Range Status   Specimen Description BLOOD LEFT HAND  Final   Special Requests AEROBIC BOTTLE ONLY 7ML  Final   Culture PENDING  Incomplete   Report Status PENDING  Incomplete  Culture, blood (routine x 2)     Status: None (Preliminary result)   Collection Time: 01/02/15 11:20 PM  Result Value Ref Range Status   Specimen Description BLOOD RIGHT HAND  Final   Special Requests AEROBIC BOTTLE ONLY 5ML  Final   Culture PENDING  Incomplete   Report Status PENDING  Incomplete     Studies: No results found.  Scheduled Meds: . aspirin  81 mg Oral QAC breakfast  . digoxin  125 mcg Oral BH-q7a  .  enoxaparin (LOVENOX) injection  40 mg Subcutaneous Q24H  . gabapentin  100 mg Oral TID  . insulin aspart  0-15 Units Subcutaneous TID WC  . lipase/protease/amylase  12,000 Units Oral TID AC  . lisinopril  10 mg Oral Daily  . metoprolol succinate  50 mg Oral BH-q7a  . pantoprazole  40 mg Oral BID  . sodium chloride  3 mL Intravenous Q12H   Continuous Infusions: . dextrose 5 % and 0.9% NaCl 50 mL/hr at 01/04/15 0015       Time spent: 35 minutes    [facility]  [facility] Pager [phone] If 7PM-7AM, please contact night-coverage at [email or web address], password [facility] 01/04/2015, 10:57 AM  LOS: 2 days

## 2015-01-04 NOTE — Progress Notes (Signed)
[facility] progress note. Chief complaint. Chest pain. History of present illness. This 59 year old male in hospital with acute on chronic pancreatitis, non-ST elevated MI with peak troponin 0.85 no trending down. The patient was seen by cardiology who recommended a Myoview stress test which the patient refused. He complained of chest pain earlier today and troponins were ordered. Troponin this afternoon was 0.18 which showed Continued improvement. Patient complains of chest pain again this evening. 12-lead EKG was done and this does not look changed from prior. There is no suggestion of acute ischemia seen. I came to see the patient at bedside. He describes the pain over the right upper quadrant of the abdomen and the right breast area. Cannot reproduce the pain with palpation. Physical exam. Vital signs. Temperature 97.7, pulse 58, respiration 21, blood pressure 162/88. O2 sats 99%. General appearance. Moderately obese male who is alert and in no distress. Cardiac. Rate and rhythm regular with occasional irregular beat. Lungs. Breath sounds are clear but reduced. Abdomen. Does have some right upper quadrant pain with palpation. Bowel sounds are present. Impression/plan. Problem #1. Chest pain. Troponins already ordered and an additional 2 troponins are pending. We'll follow for these results. Current chest pain does not appear to be cardiac given the location and description. Nonetheless we'll repeat an EKG in approximately 6 hours changes.

## 2015-01-04 NOTE — Progress Notes (Addendum)
Pt started complaining of right sided chest pain at 19:50. Nitro SL given two times. Did not relieve pain. STAT EKG done shows A.fib. Pt still complaining of chest pain and wants his dilaudid.   Chest pain relieved after giving dilaudid.

## 2015-01-05 ENCOUNTER — Inpatient Hospital Stay (HOSPITAL_COMMUNITY): Payer: Non-veteran care

## 2015-01-05 DIAGNOSIS — D696 Thrombocytopenia, unspecified: Secondary | ICD-10-CM | POA: Diagnosis present

## 2015-01-05 LAB — GLUCOSE, CAPILLARY
GLUCOSE-CAPILLARY: 139 mg/dL — AB (ref 65–99)
Glucose-Capillary: 121 mg/dL — ABNORMAL HIGH (ref 65–99)
Glucose-Capillary: 124 mg/dL — ABNORMAL HIGH (ref 65–99)
Glucose-Capillary: 126 mg/dL — ABNORMAL HIGH (ref 65–99)
Glucose-Capillary: 130 mg/dL — ABNORMAL HIGH (ref 65–99)

## 2015-01-05 LAB — CBC
HEMATOCRIT: 37 % — AB (ref 39.0–52.0)
Hemoglobin: 10.8 g/dL — ABNORMAL LOW (ref 13.0–17.0)
MCH: 25.7 pg — AB (ref 26.0–34.0)
MCHC: 29.2 g/dL — AB (ref 30.0–36.0)
MCV: 88.1 fL (ref 78.0–100.0)
PLATELETS: 110 10*3/uL — AB (ref 150–400)
RBC: 4.2 MIL/uL — ABNORMAL LOW (ref 4.22–5.81)
RDW: 15 % (ref 11.5–15.5)
WBC: 4.5 10*3/uL (ref 4.0–10.5)

## 2015-01-05 LAB — RENAL FUNCTION PANEL
Albumin: 3 g/dL — ABNORMAL LOW (ref 3.5–5.0)
Anion gap: 6 (ref 5–15)
BUN: 22 mg/dL — AB (ref 6–20)
CHLORIDE: 110 mmol/L (ref 101–111)
CO2: 27 mmol/L (ref 22–32)
CREATININE: 1.14 mg/dL (ref 0.61–1.24)
Calcium: 8.9 mg/dL (ref 8.9–10.3)
GFR calc Af Amer: >60 mL/min (ref >60)
GFR calc non Af Amer: >60 mL/min (ref >60)
Glucose, Bld: 157 mg/dL — ABNORMAL HIGH (ref 65–99)
POTASSIUM: 4.4 mmol/L (ref 3.5–5.1)
Phosphorus: 2.9 mg/dL (ref 2.5–4.6)
Sodium: 143 mmol/L (ref 135–145)

## 2015-01-05 LAB — TROPONIN I: Troponin I: 0.07 ng/mL — ABNORMAL HIGH (ref <0.031)

## 2015-01-05 MED ORDER — OXYCODONE HCL 5 MG PO TABS
5.0000 mg | ORAL_TABLET | Freq: Once | ORAL | Status: AC
  Administered 2015-01-05: 5 mg via ORAL
  Filled 2015-01-05: qty 1

## 2015-01-05 NOTE — Progress Notes (Signed)
Pt had 2 Oxy for c/o pain went to CT per bed on monitor.

## 2015-01-05 NOTE — Progress Notes (Signed)
[facility] PROGRESS NOTE   Dereck Brown BJS:[id number] DOB: May 26, 1955 DOA: 01/01/2015 PCP: PROVIDER NOT IN SYSTEM  HPI/Subjective: Patient is still does not keep eye contact. Continues to refuse MRI of the brain, and CT scan his answer is "I will see, I'm tired right now". Complaining about chest pain last night, I have asked him if he will allow stress test and he refused. I had prolonged talk with the patient, explained to him he'll be transferred to telemetry bed. He said he is okay with CT without oral or IV contrast.  Assessment/Plan: Active Problems:   Atrial fibrillation (HCC)   Nonischemic cardiomyopathy (HCC)   Pancreatitis, acute   Morbid obesity (HCC)   Chronic combined systolic and diastolic CHF (congestive heart failure) (HCC)   Acute pancreatitis   Diabetes mellitus type 2, controlled ([place])   HTN (hypertension), benign    Acute on chronic pancreatitis, likely Abdominal pain likely secondary to acute on chronic pancreatitis, patient came in with nausea, vomiting and epigastric abdominal pain. Patient has history of chronic pancreatitis with occasional exacerbations. Presented with elevated lipase of 191. Ordered CT without IV contrast, patient refusing testing. Continue NPO status and gentle IV fluid hydration, as he still complaining about abdominal pain.  SIRS Patient developed fever of 101.8 and respiratory rate of 35 with blood pressure of 90/58. Description be secondary to acute pancreatitis versus infectious condition. Urinalysis, urine cultures obtained. Chest x-ray on admission did not show any infiltrates. Started on antibiotics empirically.  Non-STEMI Troponin peak of 0.85, trending down, patient denies any chest pain. Seen by cardiology and recommended Myoview stress test. Patient refused testing, cardiology signed off. Continue beta blockers and low-dose aspirin. Had chest pain again, offered stress testing again, patient refused, troponin  trending down, last troponin 0.07.  Atrial fibrillation Rate is controlled with beta blockers. On aspirin 81 mg because of history of medication noncompliance. This patients CHA2DS2-VASc Score and unadjusted Ischemic Stroke Rate (% per year) is equal to 3.2 % stroke rate/year from a score of 3 for CHF, HTN and DM.  Diabetes mellitus type 2 Uncontrolled diabetes mellitus with hemoglobin A1c of 7.7, much better than A1c in February which was 9.7. His insulin regimen held because of low hemoglobin.  Headache Unclear etiology, but patient has atrial fibrillation and not on anticoagulMRI of the brain ordered, patient refusing.  Hyperkalemia Mild hyperkalemia with potassium is 5.3, 1 dose of Kayexalate, improved to 4.4 today .  Acute renal failure Baseline creatinine is 1.2 from October 11, creatinine increased to 1.7. This is likely secondary to dehydration, held Lasix and kept trickle IV fluids @50  mL/hour, creatinine improved to 1.1.   Code Status: Full Code Family Communication: Plan discussed with the patient. Disposition Plan: Remains inpatient Diet: Diet NPO time specified Except for: Sips with Meds  Consultants:  Cardiology  Procedures:  None  Antibiotics:  None   Objective: Filed Vitals:   01/05/15 0939  BP:   Pulse: 63  Temp:   Resp:     Intake/Output Summary (Last 24 hours) at 01/05/15 1021 Last data filed at 01/05/15 0600  Gross per 24 hour  Intake   1420 ml  Output    675 ml  Net    745 ml   Filed Weights   01/02/15 0647 01/03/15 0437 01/05/15 0500  Weight: 154.5 kg (340 lb 9.8 oz) 154.6 kg (340 lb 13.3 oz) 153.1 kg (337 lb 8.4 oz)    Exam: General: Alert and awake, oriented x3, not in any  acute distress. HEENT: anicteric sclera, pupils reactive to light and accommodation, EOMI CVS: S1-S2 clear, no murmur rubs or gallops Chest: clear to auscultation bilaterally, no wheezing, rales or rhonchi Abdomen: soft nontender, nondistended, normal bowel  sounds, no organomegaly Extremities: no cyanosis, clubbing or edema noted bilaterally Neuro: Cranial nerves II-XII intact, no focal neurological deficits  Data Reviewed: Basic Metabolic Panel:  Recent Labs Lab 01/01/15 2150 01/02/15 0323 01/02/15 0324 01/03/15 0314 01/04/15 0248 01/05/15 0532  NA 137 140  --  141 136 143  K 3.6 4.6  --  5.1 5.3* 4.4  CL 106 105  --  109 104 110  CO2 22 24  --  24 24 27   GLUCOSE 103* 129*  --  110* 191* 157*  BUN 16 21*  --  30* 24* 22*  CREATININE 1.21 1.76* 1.85* 1.58* 1.16 1.14  CALCIUM 8.6* 8.5*  --  8.5* 8.5* 8.9  PHOS  --   --   --  3.1 3.1 2.9   Liver Function Tests:  Recent Labs Lab 01/01/15 2221 01/02/15 0323 01/03/15 0314 01/04/15 0248 01/05/15 0532  AST 24 28  --   --   --   ALT 16* 15*  --   --   --   ALKPHOS 62 50  --   --   --   BILITOT 1.4* 0.8  --   --   --   PROT 9.0* 8.1  --   --   --   ALBUMIN 3.3* 2.8* 2.8* 3.1* 3.0*    Recent Labs Lab 01/01/15 2221 01/03/15 0314  LIPASE 191* 65*   No results for input(s): AMMONIA in the last 168 hours. CBC:  Recent Labs Lab 01/01/15 2150 01/02/15 0323 01/05/15 0532  WBC 6.0 11.5* 4.5  HGB 13.1 12.2* 10.8*  HCT 42.1 39.7 37.0*  MCV 86.6 88.0 88.1  PLT 126* 119* 110*   Cardiac Enzymes:  Recent Labs Lab 01/02/15 1655 01/02/15 2329 01/04/15 1811 01/04/15 2304 01/05/15 0532  TROPONINI 0.58* 0.45* 0.12* 0.10* 0.07*   BNP (last 3 results)  Recent Labs  05/06/14 2240 06/17/14 2314  BNP 130.4* 181.2*    ProBNP (last 3 results)  Recent Labs  02/10/14 0819 02/11/14 0441 02/12/14 0434  PROBNP 829.3* 539.9* 386.4*    CBG:  Recent Labs Lab 01/04/15 0855 01/04/15 1251 01/04/15 1716 01/04/15 2108 01/05/15 0806  GLUCAP 160* 162* 169* 137* 139*    Micro Recent Results (from the past 240 hour(s))  MRSA PCR Screening     Status: None   Collection Time: 01/02/15  6:42 AM  Result Value Ref Range Status   MRSA by PCR NEGATIVE NEGATIVE Final     Comment:        The GeneXpert MRSA Assay (FDA approved for NASAL specimens only), is one component of a comprehensive MRSA colonization surveillance program. It is not intended to diagnose MRSA infection nor to guide or monitor treatment for MRSA infections.   Culture, blood (routine x 2)     Status: None (Preliminary result)   Collection Time: 01/02/15 11:05 PM  Result Value Ref Range Status   Specimen Description BLOOD LEFT HAND  Final   Special Requests AEROBIC BOTTLE ONLY 7ML  Final   Culture NO GROWTH 1 DAY  Final   Report Status PENDING  Incomplete  Culture, blood (routine x 2)     Status: None (Preliminary result)   Collection Time: 01/02/15 11:20 PM  Result Value Ref Range Status   Specimen Description  BLOOD RIGHT HAND  Final   Special Requests AEROBIC BOTTLE ONLY 5ML  Final   Culture NO GROWTH 1 DAY  Final   Report Status PENDING  Incomplete     Studies: No results found.  Scheduled Meds: . aspirin  81 mg Oral QAC breakfast  . azithromycin  500 mg Intravenous Q24H  . cefTRIAXone (ROCEPHIN)  IV  1 g Intravenous Q24H  . digoxin  125 mcg Oral BH-q7a  . enoxaparin (LOVENOX) injection  40 mg Subcutaneous Q24H  . gabapentin  100 mg Oral TID  . insulin aspart  0-15 Units Subcutaneous TID WC  . lipase/protease/amylase  12,000 Units Oral TID AC  . lisinopril  10 mg Oral Daily  . metoprolol succinate  25 mg Oral BH-q7a  . pantoprazole  40 mg Oral BID  . sodium chloride  3 mL Intravenous Q12H   Continuous Infusions: . dextrose 5 % and 0.9% NaCl 50 mL/hr at 01/04/15 1205       Time spent: 35 minutes    [facility]  [facility] Pager [phone] If 7PM-7AM, please contact night-coverage at [email or web address], password [facility] 01/05/2015, 10:21 AM  LOS: 3 days

## 2015-01-05 NOTE — Progress Notes (Addendum)
Pt transferred to 3 [address] bed Bed 08 with all belongings. 02 at 2 L n/c Pt has glasses on his face and clothing in bags including shoes- no cell phone

## 2015-01-05 NOTE — Progress Notes (Signed)
Pt asked if he would agree to have CT today and he said yes but no  drinking contrast.

## 2015-01-05 NOTE — Progress Notes (Signed)
Called report to [address] [place] spoke to [employer] for pending transfer

## 2015-01-06 LAB — GLUCOSE, CAPILLARY
GLUCOSE-CAPILLARY: 126 mg/dL — AB (ref 65–99)
GLUCOSE-CAPILLARY: 135 mg/dL — AB (ref 65–99)
GLUCOSE-CAPILLARY: 131 mg/dL — AB (ref 65–99)
Glucose-Capillary: 135 mg/dL — ABNORMAL HIGH (ref 65–99)
Glucose-Capillary: 135 mg/dL — ABNORMAL HIGH (ref 65–99)

## 2015-01-06 LAB — RENAL FUNCTION PANEL
ALBUMIN: 2.7 g/dL — AB (ref 3.5–5.0)
Anion gap: 6 (ref 5–15)
BUN: 16 mg/dL (ref 6–20)
CHLORIDE: 107 mmol/L (ref 101–111)
CO2: 27 mmol/L (ref 22–32)
Calcium: 8.7 mg/dL — ABNORMAL LOW (ref 8.9–10.3)
Creatinine, Ser: 1.1 mg/dL (ref 0.61–1.24)
GFR calc Af Amer: >60 mL/min (ref >60)
Glucose, Bld: 129 mg/dL — ABNORMAL HIGH (ref 65–99)
PHOSPHORUS: 1.7 mg/dL — AB (ref 2.5–4.6)
POTASSIUM: 3.4 mmol/L — AB (ref 3.5–5.1)
Sodium: 140 mmol/L (ref 135–145)

## 2015-01-06 MED ORDER — POTASSIUM CHLORIDE CRYS ER 20 MEQ PO TBCR
20.0000 meq | EXTENDED_RELEASE_TABLET | Freq: Once | ORAL | Status: AC
  Administered 2015-01-06: 20 meq via ORAL
  Filled 2015-01-06: qty 1

## 2015-01-06 MED ORDER — POTASSIUM CHLORIDE 10 MEQ/100ML IV SOLN: 10.0000 meq | INTRAVENOUS | Status: DC

## 2015-01-06 NOTE — Progress Notes (Signed)
[facility] PROGRESS NOTE   Lorenza Winkleman HFW:[id number] DOB: 03-20-1956 DOA: 01/01/2015 PCP: PROVIDER NOT IN SYSTEM  HPI/Subjective: CT without contrast showed mild acute on chronic pancreatitis. Headache resolved, discontinue MRI. Still having 7/10 abdominal pain, said he is not ready for food. Continue nothing by mouth with ice chips only.  Assessment/Plan: Active Problems:   Atrial fibrillation (HCC)   Nonischemic cardiomyopathy (HCC)   Pancreatitis, acute   Morbid obesity (HCC)   Chronic combined systolic and diastolic CHF (congestive heart failure) (HCC)   Acute pancreatitis   Diabetes mellitus type 2, controlled ([place])   HTN (hypertension), benign   Thrombocytopenia (HCC)    Acute on chronic pancreatitis, likely Abdominal pain likely secondary to acute on chronic pancreatitis, patient came in with nausea, vomiting and epigastric abdominal pain. Patient has history of chronic pancreatitis with occasional exacerbations. Presented with elevated lipase of 191. CT scan of abdomen/pelvis showed mild acute on chronic pancreatitis with chronic pseudocyst without changes. History complaining about 7/10 abdominal pain, continue nothing by mouth status with ice chips.  SIRS Patient developed fever of 101.8 and respiratory rate of 35 with blood pressure of 90/58. Description be secondary to acute pancreatitis versus infectious condition. Urinalysis, urine cultures obtained. Chest x-ray on admission did not show any infiltrates. Started on antibiotics empirically.  Non-STEMI Troponin peak of 0.85, trending down, patient denies any chest pain. Seen by cardiology and recommended Myoview stress test. Patient refused testing, cardiology signed off. Continue beta blockers and low-dose aspirin. Had chest pain again, offered stress testing again, patient refused, troponin trending down, last troponin 0.07.  Atrial fibrillation Rate is controlled with beta blockers. On aspirin 81  mg because of history of medication noncompliance. This patients CHA2DS2-VASc Score and unadjusted Ischemic Stroke Rate (% per year) is equal to 3.2 % stroke rate/year from a score of 3 for CHF, HTN and DM.  Diabetes mellitus type 2 Uncontrolled diabetes mellitus with hemoglobin A1c of 7.7, much better than A1c in February which was 9.7. His insulin regimen held because of low hemoglobin.  Headache Unclear etiology, but patient has atrial fibrillation and not on anticoagulation, MRI of the brain ordered, patient refused it, canceled.  Hyperkalemia Mild hyperkalemia with potassium is 5.3, 1 dose of Kayexalate, improved to 4.4 today .  Acute renal failure Baseline creatinine is 1.2 from October 11, creatinine increased to 1.7. This is likely secondary to dehydration, held Lasix and kept trickle IV fluids @50  mL/hour, creatinine improved to 1.1.   Code Status: Full Code Family Communication: Plan discussed with the patient. Disposition Plan: Remains inpatient Diet: Diet NPO time specified Except for: Sips with Meds  Consultants:  Cardiology  Procedures:  None  Antibiotics:  None   Objective: Filed Vitals:   01/06/15 0811  BP: 177/70  Pulse: 66  Temp:   Resp:     Intake/Output Summary (Last 24 hours) at 01/06/15 1110 Last data filed at 01/06/15 1043  Gross per 24 hour  Intake   1088 ml  Output    625 ml  Net    463 ml   Filed Weights   01/05/15 0500 01/05/15 1342 01/06/15 0400  Weight: 153.1 kg (337 lb 8.4 oz) 156 kg (343 lb 14.7 oz) 154.949 kg (341 lb 9.6 oz)    Exam: General: Alert and awake, oriented x3, not in any acute distress. HEENT: anicteric sclera, pupils reactive to light and accommodation, EOMI CVS: S1-S2 clear, no murmur rubs or gallops Chest: clear to auscultation bilaterally, no wheezing, rales or  rhonchi Abdomen: soft nontender, nondistended, normal bowel sounds, no organomegaly Extremities: no cyanosis, clubbing or edema noted  bilaterally Neuro: Cranial nerves II-XII intact, no focal neurological deficits  Data Reviewed: Basic Metabolic Panel:  Recent Labs Lab 01/02/15 0323 01/02/15 0324 01/03/15 0314 01/04/15 0248 01/05/15 0532 01/06/15 0320  NA 140  --  141 136 143 140  K 4.6  --  5.1 5.3* 4.4 3.4*  CL 105  --  109 104 110 107  CO2 24  --  24 24 27 27   GLUCOSE 129*  --  110* 191* 157* 129*  BUN 21*  --  30* 24* 22* 16  CREATININE 1.76* 1.85* 1.58* 1.16 1.14 1.10  CALCIUM 8.5*  --  8.5* 8.5* 8.9 8.7*  PHOS  --   --  3.1 3.1 2.9 1.7*   Liver Function Tests:  Recent Labs Lab 01/01/15 2221 01/02/15 0323 01/03/15 0314 01/04/15 0248 01/05/15 0532 01/06/15 0320  AST 24 28  --   --   --   --   ALT 16* 15*  --   --   --   --   ALKPHOS 62 50  --   --   --   --   BILITOT 1.4* 0.8  --   --   --   --   PROT 9.0* 8.1  --   --   --   --   ALBUMIN 3.3* 2.8* 2.8* 3.1* 3.0* 2.7*    Recent Labs Lab 01/01/15 2221 01/03/15 0314  LIPASE 191* 65*   No results for input(s): AMMONIA in the last 168 hours. CBC:  Recent Labs Lab 01/01/15 2150 01/02/15 0323 01/05/15 0532  WBC 6.0 11.5* 4.5  HGB 13.1 12.2* 10.8*  HCT 42.1 39.7 37.0*  MCV 86.6 88.0 88.1  PLT 126* 119* 110*   Cardiac Enzymes:  Recent Labs Lab 01/02/15 1655 01/02/15 2329 01/04/15 1811 01/04/15 2304 01/05/15 0532  TROPONINI 0.58* 0.45* 0.12* 0.10* 0.07*   BNP (last 3 results)  Recent Labs  05/06/14 2240 06/17/14 2314  BNP 130.4* 181.2*    ProBNP (last 3 results)  Recent Labs  02/10/14 0819 02/11/14 0441 02/12/14 0434  PROBNP 829.3* 539.9* 386.4*    CBG:  Recent Labs Lab 01/05/15 1617 01/05/15 2000 01/05/15 2342 01/06/15 0442 01/06/15 0732  GLUCAP 126* 130* 121* 126* 135*    Micro Recent Results (from the past 240 hour(s))  MRSA PCR Screening     Status: None   Collection Time: 01/02/15  6:42 AM  Result Value Ref Range Status   MRSA by PCR NEGATIVE NEGATIVE Final    Comment:        The  GeneXpert MRSA Assay (FDA approved for NASAL specimens only), is one component of a comprehensive MRSA colonization surveillance program. It is not intended to diagnose MRSA infection nor to guide or monitor treatment for MRSA infections.   Culture, blood (routine x 2)     Status: None (Preliminary result)   Collection Time: 01/02/15 11:05 PM  Result Value Ref Range Status   Specimen Description BLOOD LEFT HAND  Final   Special Requests AEROBIC BOTTLE ONLY 7ML  Final   Culture NO GROWTH 2 DAYS  Final   Report Status PENDING  Incomplete  Culture, blood (routine x 2)     Status: None (Preliminary result)   Collection Time: 01/02/15 11:20 PM  Result Value Ref Range Status   Specimen Description BLOOD RIGHT HAND  Final   Special Requests AEROBIC BOTTLE ONLY 5ML  Final   Culture NO GROWTH 2 DAYS  Final   Report Status PENDING  Incomplete     Studies: Ct Abdomen Pelvis Wo Contrast  01/05/2015  CLINICAL DATA:  Abdominal pain, nausea EXAM: CT ABDOMEN AND PELVIS WITHOUT CONTRAST TECHNIQUE: Multidetector CT imaging of the abdomen and pelvis was performed following the standard protocol without IV contrast. COMPARISON:  CTA abdomen pelvis dated 05/01/2013 FINDINGS: Lower chest: Scattered areas of linear scarring/atelectasis in the right middle lobe and bilateral lower lobes. Hepatobiliary: Scattered hepatic cysts, including a 3.0 cm cyst anteriorly along the right liver (series 2/ image 27). Status post cholecystectomy. No intrahepatic or extrahepatic ductal dilatation. Pancreas: Stable appearance of the pancreas with irregular ductal dilatation in the pancreatic body/ tail, associated pancreatic atrophy, and a stable 2.5 cm fluid collection along the posterior aspect of the pancreatic body (series 2/ image 41). Associated parenchymal calcifications. This appearance likely reflects sequela of prior/ chronic pancreatitis with a dominant pseudocyst. Superimposed mild acute peripancreatic inflammatory  stranding is suspected (series 2/ image 44). Spleen: Within normal limits. Adrenals/Urinary Tract: Adrenal glands are within normal limits. Kidneys are within normal limits. No renal, ureteral, or bladder calculi. Bladder is within normal limits. Stomach/Bowel: Stomach is within normal limits. No evidence of bowel obstruction. Normal appendix (series 2/ image 66). Vascular/Lymphatic: No evidence of abdominal aortic aneurysm. Small retroperitoneal lymph nodes which do not meet pathologic CT size criteria. Reproductive: Mild prostatomegaly. Other: No abdominopelvic ascites. Laxity of the anterior abdominal wall/large ventral hernia (series 2/ image 49). Musculoskeletal: Degenerative changes of the visualized thoracolumbar spine. IMPRESSION: Suspected mild acute on chronic pancreatitis. Stable 2.5 cm fluid collection along the posterior aspect of the pancreatic body is favored to reflect a pseudocyst, unchanged from 2015. No evidence of bowel obstruction.  Normal appendix. Additional stable ancillary findings as above. Electronically Signed   By: Julian Hy M.D.   On: 01/05/2015 13:16    Scheduled Meds: . aspirin  81 mg Oral QAC breakfast  . azithromycin  500 mg Intravenous Q24H  . cefTRIAXone (ROCEPHIN)  IV  1 g Intravenous Q24H  . digoxin  125 mcg Oral BH-q7a  . enoxaparin (LOVENOX) injection  40 mg Subcutaneous Q24H  . gabapentin  100 mg Oral TID  . insulin aspart  0-15 Units Subcutaneous TID WC  . lipase/protease/amylase  12,000 Units Oral TID AC  . lisinopril  10 mg Oral Daily  . metoprolol succinate  25 mg Oral BH-q7a  . pantoprazole  40 mg Oral BID  . sodium chloride  3 mL Intravenous Q12H   Continuous Infusions: . dextrose 5 % and 0.9% NaCl 50 mL/hr (01/06/15 0854)       Time spent: 35 minutes    [facility]  [facility] Pager [phone] If 7PM-7AM, please contact night-coverage at [email or web address], password [facility] 01/06/2015, 11:10 AM  LOS: 4 days

## 2015-01-06 NOTE — Progress Notes (Signed)
Spoke with Dr. Hartford Poli, he stated to discontinue order for MRI due to patient refusing MRI.  Order DC'D in EPIC.

## 2015-01-06 NOTE — Progress Notes (Signed)
Late entry.  Pt verbally aggressive/abusive to [place], Therapist, sports and [place], [place].  Pt making demands for ice water and shouting profanities when explained to him that he can not have it other than sips with medications.    Pt also  refusing clinical assessments and medications / tests at times.  AC and security notified.  Will continue to monitor.

## 2015-01-07 DIAGNOSIS — I4891 Unspecified atrial fibrillation: Secondary | ICD-10-CM

## 2015-01-07 LAB — GLUCOSE, CAPILLARY
GLUCOSE-CAPILLARY: 122 mg/dL — AB (ref 65–99)
GLUCOSE-CAPILLARY: 126 mg/dL — AB (ref 65–99)
GLUCOSE-CAPILLARY: 127 mg/dL — AB (ref 65–99)
GLUCOSE-CAPILLARY: 134 mg/dL — AB (ref 65–99)
Glucose-Capillary: 116 mg/dL — ABNORMAL HIGH (ref 65–99)
Glucose-Capillary: 131 mg/dL — ABNORMAL HIGH (ref 65–99)

## 2015-01-07 LAB — RENAL FUNCTION PANEL
ALBUMIN: 2.9 g/dL — AB (ref 3.5–5.0)
ANION GAP: 8 (ref 5–15)
BUN: 13 mg/dL (ref 6–20)
CALCIUM: 8.8 mg/dL — AB (ref 8.9–10.3)
CO2: 24 mmol/L (ref 22–32)
Chloride: 109 mmol/L (ref 101–111)
Creatinine, Ser: 1.33 mg/dL — ABNORMAL HIGH (ref 0.61–1.24)
GFR, EST NON AFRICAN AMERICAN: 57 mL/min — AB (ref >60)
Glucose, Bld: 147 mg/dL — ABNORMAL HIGH (ref 65–99)
PHOSPHORUS: 2.3 mg/dL — AB (ref 2.5–4.6)
Potassium: 3.2 mmol/L — ABNORMAL LOW (ref 3.5–5.1)
SODIUM: 141 mmol/L (ref 135–145)

## 2015-01-07 MED ORDER — ENOXAPARIN SODIUM 80 MG/0.8ML ~~LOC~~ SOLN: 75.0000 mg | SUBCUTANEOUS | Status: DC

## 2015-01-07 MED ORDER — SODIUM CHLORIDE 0.9 % IV SOLN
INTRAVENOUS | Status: DC
  Administered 2015-01-07: 22:00:00 via INTRAVENOUS

## 2015-01-07 MED ORDER — AMLODIPINE BESYLATE 10 MG PO TABS
10.0000 mg | ORAL_TABLET | Freq: Every day | ORAL | Status: DC
  Administered 2015-01-08: 10 mg via ORAL
  Filled 2015-01-07 (×2): qty 1

## 2015-01-07 MED ORDER — POTASSIUM CHLORIDE CRYS ER 20 MEQ PO TBCR
40.0000 meq | EXTENDED_RELEASE_TABLET | Freq: Once | ORAL | Status: DC
  Filled 2015-01-07: qty 2

## 2015-01-07 NOTE — Clinical Social Work Note (Signed)
Patient requested to speak with CSW. Patient requests assistance with setting up his home health services through the [place]. He is seen by Lind Guest with the [facility]. CSW has passed this information to [facility]. CSW signing off at this time.    Liz Beach MSW, [place], [place], [phone]

## 2015-01-07 NOTE — Care Management Note (Addendum)
Case Management Note  Patient Details  Name: Don Brown MRN: [id number] Date of Birth: February 08, 1956  Subjective/Objective: Pt admitted for A Fib. Pt is from home. Per pt he was trying to get Aid services set up via the [place]. CM did call CSWLind Guest [phone] ext [phone] and left vm in regards to disposition needs. Awaiting return phone call. CM also received call from [facility] to see if pt is stable for transfer. CM did speak with pt and he is refusing to transfer at this time.                 Action/Plan: Refusal form faxed to [facility], CM will continue to monitor for additional needs.    Expected Discharge Date:  01/04/15               Expected Discharge Plan:  Home/Self Care  In-House Referral:     Discharge planning Services  CM Consult  Post Acute Care Choice:    Choice offered to:     DME Arranged:    DME Agency:     HH Arranged:    HH Agency:     Status of Service:  In process, will continue to follow  Medicare Important Message Given:  Yes-second notification given Date Medicare IM Given:    Medicare IM give by:    Date Additional Medicare IM Given:    Additional Medicare Important Message give by:     If discussed at [place] of Stay Meetings, dates discussed:    Additional Comments:1450 01-08-15 Jacqlyn Krauss, RN,BSN [phone] CM did speak with Lind Guest with [facility] and she stated to fax the [facility] order to her Office and she would [facility]. In reference to [facility]. [facility]. Per Anderson Malta pt will get an aide for 6 days a week 2 hours per day. Pt has been through several agencies and only has one more agency to utilize. Pt will need to go to a local pharmacy for all medication needs.  No further needs from CM at this time.   Bethena Roys, RN 01/07/2015, 4:36 PM

## 2015-01-07 NOTE — Progress Notes (Signed)
Pt refuse BiPAP. Pt also stated that he does not wear NIV mask at home. Pt refuse and wishes not to wear a CPAP machine. Pt is stable at this time, no distress noted.

## 2015-01-07 NOTE — Evaluation (Signed)
Physical Therapy Evaluation Patient Details Name: Don Brown MRN: [id number] DOB: 18-Aug-1955 Today's Date: 01/07/2015   History of Present Illness  Don Brown is a 59 y.o. 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  Clinical Impression  Pt admitted with above diagnosis. Pt currently with functional limitations due to the deficits listed below (see PT Problem List). Don Brown session was limited by his refusal to participate in therapy but agreed to ambulate to chair so he would wash up.  He is very agitated and impulsive, requiring close min guard assist ambulating 12 ft in room w/ support of IV pole.  He reports that he has difficulty navigating steps at home and he lives on the second floor.  Therefore, pt will benefit from skilled PT to increase their independence and safety with mobility to allow discharge to the venue listed below.      Follow Up Recommendations Home health PT;Supervision - Intermittent    Equipment Recommendations  None recommended by PT    Recommendations for Other Services       Precautions / Restrictions Precautions Precautions: Fall Precaution Comments: Pt impulsive Restrictions Weight Bearing Restrictions: No      Mobility  Bed Mobility Overal bed mobility: Needs Assistance Bed Mobility: Supine to Sit     Supine to sit: Supervision     General bed mobility comments: Supervision for pt safety as he is impulsive and does not report to therapist what he is planning to do  Transfers Overall transfer level: Needs assistance Equipment used: Rolling walker (2 wheeled) Transfers: Sit to/from Stand Sit to Stand: Min guard         General transfer comment: Close min guard 2/2 pt's instability upon standing as  he reaches for RW.  Pt impulsive and not communicating to PT his plan prior to ambulating to chair.  Ambulation/Gait Ambulation/Gait assistance: Min guard Ambulation Distance (Feet): 12 Feet Assistive device: Rolling walker (2 wheeled) (IV pole) Gait Pattern/deviations: Step-through pattern;Antalgic;Staggering left;Staggering right   Gait velocity interpretation: Below normal speed for age/gender General Gait Details: Pt initially uses RW for ~3 ft but then begins reaching for IV pole w/o notifying therapist as he ambulates to his chair.  Close min guard for safety as pt ignores therapists recommendation to use RW.  Stairs            Wheelchair Mobility    Modified Rankin (Stroke Patients Only)       Balance Overall balance assessment: Needs assistance Sitting-balance support: Bilateral upper extremity supported;Feet supported Sitting balance-Leahy Scale: Good     Standing balance support: Single extremity supported;During functional activity Standing balance-Leahy Scale: Poor                               Pertinent Vitals/Pain Pain Assessment: No/denies pain    Home Living Family/patient expects to be discharged to:: Private residence Living Arrangements: Children (two teenaged children) Available Help at Discharge: Family;Available PRN/intermittently Type of Home: Apartment Home Access: Stairs to enter Entrance Stairs-Rails: Chemical engineer of Steps: 16 Home Layout: One level Home Equipment: Walker - 2 wheels;Cane - single point Additional Comments: Pt poor historian as he was very agitated when PT entered room.  Pt saying, "You all come  in here and demand all of these things", "I'm not in the mood to answer 21 questions"    Prior Function Level of Independence: Independent with assistive device(s);Needs assistance   Gait / Transfers Assistance Needed: Per pt and pt's daughter pt was using cane at all times; unclear when pt would  use RW, pt becomes agitated when asked about this  ADL's / Homemaking Assistance Needed: Per pt's daughter, his two teenaged children assist pt w/ dressing.  PTA pt had [place] aide who came to home 6x/wk for 2 hrs at a time who would assist pt w/ bathing, cooking, cleaning. Pt refuses to answer additional questions about HH aide, "It was a terrible experience"        Hand Dominance        Extremity/Trunk Assessment   Upper Extremity Assessment: Overall WFL for tasks assessed           Lower Extremity Assessment:  (unable to assess 2/2 pt refusing thorough eval)         Communication   Communication: No difficulties  Cognition Arousal/Alertness: Awake/alert Behavior During Therapy: Agitated;Impulsive Overall Cognitive Status: Within Functional Limits for tasks assessed                      General Comments General comments (skin integrity, edema, etc.): Pt's SpO2 remains above 93% throughout session on RA.    Exercises        Assessment/Plan    PT Assessment Patient needs continued PT services  PT Diagnosis Difficulty walking;Generalized weakness;Abnormality of gait   PT Problem List Decreased strength;Decreased range of motion;Decreased activity tolerance;Decreased balance;Decreased mobility;Decreased knowledge of use of DME;Decreased safety awareness;Decreased knowledge of precautions;Obesity  PT Treatment Interventions DME instruction;Gait training;Stair training;Functional mobility training;Therapeutic activities;Therapeutic exercise;Balance training;Neuromuscular re-education;Patient/family education   PT Goals (Current goals can be found in the Care Plan section) Acute Rehab PT Goals Patient Stated Goal: to be left alone PT Goal Formulation: With patient/family Time For Goal Achievement: 01/21/15 Potential to Achieve Goals: Fair    Frequency Min 3X/week   Barriers to discharge Inaccessible home environment;Decreased caregiver support 16 steps to enter  home and intermittent assist from children    Co-evaluation               End of Session   Activity Tolerance: Treatment limited secondary to agitation Patient left: in chair;with call bell/phone within reach;with family/visitor present Nurse Communication: Mobility status;Precautions;Other (comment) (to NT: pt would like to be washed up)         Time: 8185-6314 PT Time Calculation (min) (ACUTE ONLY): 23 min   Charges:   PT Evaluation $Initial PT Evaluation Tier I: 1 Procedure PT Treatments $Gait Training: 8-22 mins   PT G CodesJoslyn Hy PT, DPT [phone] Pager: [phone] 01/07/2015, 3:47 PM

## 2015-01-07 NOTE — Care Management Important Message (Signed)
Important Message  Patient Details  Name: Don Brown MRN: [id number] Date of Birth: 10/04/1955   Medicare Important Message Given:  Yes-second notification given    [name] May 01/07/2015, 12:02 PM

## 2015-01-07 NOTE — Progress Notes (Addendum)
[facility] PROGRESS NOTE   Don Brown HEN:[id number] DOB: July 13, 1955 DOA: 01/01/2015 PCP: PROVIDER NOT IN SYSTEM  HPI/Subjective: 59 y.o. 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 Diagnosed with acute on chronic pancreatitis.  Assessment/Plan: Active Problems:   Atrial fibrillation (HCC)   Nonischemic cardiomyopathy (HCC)   Pancreatitis, acute   Morbid obesity (HCC)   Chronic combined systolic and diastolic CHF (congestive heart failure) (HCC)   Acute pancreatitis   Diabetes mellitus type 2, controlled ([place])   HTN (hypertension), benign   Thrombocytopenia (HCC)    Acute on chronic pancreatitis. - Abdominal pain likely secondary to acute on chronic pancreatitis, patient came in with nausea, vomiting and epigastric abdominal pain.Patient has history of chronic pancreatitis with occasional exacerbations. - levated lipase of 191, improving, most recent to 61. - CT scan of abdomen/pelvis 10/15 showed mild acute on chronic pancreatitis with chronic pseudocyst without changes. - Reports abdominal pain is improving, asking for food, will start on clear liquid diet and advance as tolerated. - Continue with IV fluids  SIRS Patient developed fever of 101.8 and respiratory rate of 35 with blood pressure of 90/58. Description be secondary to acute pancreatitis versus infectious condition. Urinalysis, urine cultures obtained. Chest x-ray on admission did not show any infiltrates. Started on antibiotics empirically.  Non-STEMI Troponin peak of 0.85, trending down, patient denies any chest pain. Seen by cardiology and recommended Myoview stress test. Patient refused testing, cardiology signed off. Continue beta blockers and low-dose aspirin. Had chest pain again, offered stress testing  again, patient refused, troponin trending down, last troponin 0.07.  Atrial fibrillation Rate is controlled with beta blockers. On aspirin 81 mg because of history of medication noncompliance. This patients CHA2DS2-VASc Score and unadjusted Ischemic Stroke Rate (% per year) is equal to 3.2 % stroke rate/year from a score of 3 for CHF, HTN and DM.  Diabetes mellitus type 2 Uncontrolled diabetes mellitus with hemoglobin A1c of 7.7, much better than A1c in February which was 9.7. His insulin regimen held because of low hemoglobin.  Headache - Unclear etiology, but patient has atrial fibrillation and not on anticoagulation, MRI of the brain ordered, patient refused it, canceled. - Resolved no recurrence  Hypertension - Hold pressure uncontrolled, continue with metoprolol, continue with when necessary hydralazine, stop lisinopril given worsening renal function, will start on amlodipine.  Hyperkalemia/hypokalemia Monitor closely, replete as needed.  Acute renal failure Baseline creatinine is 1.2 from October 11, creatinine increased to 1.7 on admission. This is likely secondary to dehydration, held Lasix and kept trickle IV fluids @50  mL/hour, hold lisinopril as creatinine is 1.3 today.   Code Status: Full Code Family Communication: Plan discussed with the patient. Disposition Plan: Remains inpatient Diet: Diet clear liquid Room service appropriate?: Yes; Fluid consistency:: Thin  Consultants:  Cardiology  Procedures:  None  Antibiotics:  None   Objective: Filed Vitals:   01/07/15 0500  BP: 194/60  Pulse: 59  Temp: 99 F (37.2 C)  Resp:     Intake/Output Summary (Last 24 hours) at 01/07/15 1329 Last data filed at 01/07/15 0800  Gross per 24 hour  Intake 1458.33 ml  Output    175 ml  Net 1283.33 ml   Filed Weights   01/05/15 1342 01/06/15 0400 01/07/15 0500  Weight: 156 kg (343 lb 14.7 oz) 154.949 kg (341  lb 9.6 oz) 152.409 kg (336 lb)    Exam: General:  Alert and awake, oriented x3, not in any acute distress. HEENT: anicteric sclera, pupils reactive to light and accommodation, EOMI CVS: S1-S2 clear, no murmur rubs or gallops Chest: clear to auscultation bilaterally, no wheezing, rales or rhonchi Abdomen: soft nontender, nondistended, normal bowel sounds, no organomegaly Extremities: no cyanosis, clubbing or edema noted bilaterally Neuro: Cranial nerves II-XII intact, no focal neurological deficits  Data Reviewed: Basic Metabolic Panel:  Recent Labs Lab 01/03/15 0314 01/04/15 0248 01/05/15 0532 01/06/15 0320 01/07/15 0400  NA 141 136 143 140 141  K 5.1 5.3* 4.4 3.4* 3.2*  CL 109 104 110 107 109  CO2 24 24 27 27 24   GLUCOSE 110* 191* 157* 129* 147*  BUN 30* 24* 22* 16 13  CREATININE 1.58* 1.16 1.14 1.10 1.33*  CALCIUM 8.5* 8.5* 8.9 8.7* 8.8*  PHOS 3.1 3.1 2.9 1.7* 2.3*   Liver Function Tests:  Recent Labs Lab 01/01/15 2221 01/02/15 0323 01/03/15 0314 01/04/15 0248 01/05/15 0532 01/06/15 0320 01/07/15 0400  AST 24 28  --   --   --   --   --   ALT 16* 15*  --   --   --   --   --   ALKPHOS 62 50  --   --   --   --   --   BILITOT 1.4* 0.8  --   --   --   --   --   PROT 9.0* 8.1  --   --   --   --   --   ALBUMIN 3.3* 2.8* 2.8* 3.1* 3.0* 2.7* 2.9*    Recent Labs Lab 01/01/15 2221 01/03/15 0314  LIPASE 191* 65*   No results for input(s): AMMONIA in the last 168 hours. CBC:  Recent Labs Lab 01/01/15 2150 01/02/15 0323 01/05/15 0532  WBC 6.0 11.5* 4.5  HGB 13.1 12.2* 10.8*  HCT 42.1 39.7 37.0*  MCV 86.6 88.0 88.1  PLT 126* 119* 110*   Cardiac Enzymes:  Recent Labs Lab 01/02/15 1655 01/02/15 2329 01/04/15 1811 01/04/15 2304 01/05/15 0532  TROPONINI 0.58* 0.45* 0.12* 0.10* 0.07*   BNP (last 3 results)  Recent Labs  05/06/14 2240 06/17/14 2314  BNP 130.4* 181.2*    ProBNP (last 3 results)  Recent Labs  02/10/14 0819 02/11/14 0441 02/12/14 0434  PROBNP 829.3* 539.9* 386.4*     CBG:  Recent Labs Lab 01/06/15 2144 01/07/15 0034 01/07/15 0447 01/07/15 0724 01/07/15 1117  GLUCAP 135* 127* 134* 122* 116*    Micro Recent Results (from the past 240 hour(s))  MRSA PCR Screening     Status: None   Collection Time: 01/02/15  6:42 AM  Result Value Ref Range Status   MRSA by PCR NEGATIVE NEGATIVE Final    Comment:        The GeneXpert MRSA Assay (FDA approved for NASAL specimens only), is one component of a comprehensive MRSA colonization surveillance program. It is not intended to diagnose MRSA infection nor to guide or monitor treatment for MRSA infections.   Culture, blood (routine x 2)     Status: None (Preliminary result)   Collection Time: 01/02/15 11:05 PM  Result Value Ref Range Status   Specimen Description BLOOD LEFT HAND  Final   Special Requests AEROBIC BOTTLE ONLY 7ML  Final   Culture NO GROWTH 3 DAYS  Final   Report Status PENDING  Incomplete  Culture, blood (routine x 2)  Status: None (Preliminary result)   Collection Time: 01/02/15 11:20 PM  Result Value Ref Range Status   Specimen Description BLOOD RIGHT HAND  Final   Special Requests AEROBIC BOTTLE ONLY 5ML  Final   Culture NO GROWTH 3 DAYS  Final   Report Status PENDING  Incomplete     Studies: No results found.  Scheduled Meds: . aspirin  81 mg Oral QAC breakfast  . azithromycin  500 mg Intravenous Q24H  . cefTRIAXone (ROCEPHIN)  IV  1 g Intravenous Q24H  . digoxin  125 mcg Oral BH-q7a  . enoxaparin (LOVENOX) injection  40 mg Subcutaneous Q24H  . gabapentin  100 mg Oral TID  . insulin aspart  0-15 Units Subcutaneous TID WC  . lipase/protease/amylase  12,000 Units Oral TID AC  . metoprolol succinate  25 mg Oral BH-q7a  . pantoprazole  40 mg Oral BID  . sodium chloride  3 mL Intravenous Q12H   Continuous Infusions: . sodium chloride         Time spent: 35 minutes    Preeti Winegardner  [facility] Pager [phone] If 7PM-7AM, please contact  night-coverage at [email or web address], password [facility] 01/07/2015, 1:29 PM  LOS: 5 days

## 2015-01-08 DIAGNOSIS — K8500 Idiopathic acute pancreatitis without necrosis or infection: Secondary | ICD-10-CM

## 2015-01-08 LAB — BASIC METABOLIC PANEL
Anion gap: 10 (ref 5–15)
BUN: 9 mg/dL (ref 6–20)
CHLORIDE: 110 mmol/L (ref 101–111)
CO2: 23 mmol/L (ref 22–32)
Calcium: 8.8 mg/dL — ABNORMAL LOW (ref 8.9–10.3)
Creatinine, Ser: 0.95 mg/dL (ref 0.61–1.24)
GFR calc Af Amer: >60 mL/min (ref >60)
GFR calc non Af Amer: >60 mL/min (ref >60)
Glucose, Bld: 143 mg/dL — ABNORMAL HIGH (ref 65–99)
POTASSIUM: 3.1 mmol/L — AB (ref 3.5–5.1)
SODIUM: 143 mmol/L (ref 135–145)

## 2015-01-08 LAB — LIPASE, BLOOD: Lipase: 19 U/L — ABNORMAL LOW (ref 22–51)

## 2015-01-08 LAB — GLUCOSE, CAPILLARY
GLUCOSE-CAPILLARY: 154 mg/dL — AB (ref 65–99)
Glucose-Capillary: 117 mg/dL — ABNORMAL HIGH (ref 65–99)
Glucose-Capillary: 147 mg/dL — ABNORMAL HIGH (ref 65–99)

## 2015-01-08 LAB — CULTURE, BLOOD (ROUTINE X 2)
Culture: NO GROWTH
Culture: NO GROWTH

## 2015-01-08 MED ORDER — FUROSEMIDE 40 MG PO TABS: 40.0000 mg | ORAL_TABLET | Freq: Two times a day (BID) | ORAL | Status: DC

## 2015-01-08 MED ORDER — OXYCODONE-ACETAMINOPHEN 5-325 MG PO TABS
1.0000 | ORAL_TABLET | ORAL | Status: DC | PRN
  Administered 2015-01-08: 1 via ORAL
  Filled 2015-01-08 (×2): qty 1

## 2015-01-08 MED ORDER — ZOLPIDEM TARTRATE 5 MG PO TABS
5.0000 mg | ORAL_TABLET | Freq: Once | ORAL | Status: AC
  Administered 2015-01-08: 5 mg via ORAL
  Filled 2015-01-08: qty 1

## 2015-01-08 MED ORDER — POTASSIUM CHLORIDE CRYS ER 20 MEQ PO TBCR
40.0000 meq | EXTENDED_RELEASE_TABLET | Freq: Once | ORAL | Status: AC
  Administered 2015-01-08: 40 meq via ORAL
  Filled 2015-01-08: qty 2

## 2015-01-08 NOTE — Discharge Instructions (Signed)
[facility]   Get CBC, CMP, 2 view Chest X ray checked  by Primary MD next visit.    Activity: As tolerated with Full fall precautions use walker/cane & assistance as needed   Disposition Home    Diet: Heart Healthy , low-fat diet , with feeding assistance and aspiration precautions.  For Heart failure patients - Check your Weight same time everyday, if you gain over 2 pounds, or you develop in leg swelling, experience more shortness of breath or chest pain, call your Primary MD immediately. Follow Cardiac Low Salt Diet and 1.5 lit/day fluid restriction.   On your next visit with your primary care physician please Get Medicines reviewed and adjusted.   Please request your Prim.MD to go over all Hospital Tests and Procedure/Radiological results at the follow up, please get all Hospital records sent to your Prim MD by signing hospital release before you go home.   If you experience worsening of your admission symptoms, develop shortness of breath, life threatening emergency, suicidal or homicidal thoughts you must seek medical attention immediately by calling 911 or calling your MD immediately  if symptoms less severe.  You Must read complete instructions/literature along with all the possible adverse reactions/side effects for all the Medicines you take and that have been prescribed to you. Take any new Medicines after you have completely understood and accpet all the possible adverse reactions/side effects.   Do not drive, operating heavy machinery, perform activities at heights, swimming or participation in water activities or provide baby sitting services if your were admitted for syncope or siezures until you have seen by Primary MD or a Neurologist and advised to do so again.  Do not drive when taking Pain medications.    Do not take more than prescribed Pain, Sleep and Anxiety Medications  Special Instructions: If you have smoked or chewed Tobacco  in the last 2  yrs please stop smoking, stop any regular Alcohol  and or any Recreational drug use.  Wear Seat belts while driving.   Please note  You were cared for by a hospitalist during your hospital stay. If you have any questions about your discharge medications or the care you received while you were in the hospital after you are discharged, you can call the unit and asked to speak with the hospitalist on call if the hospitalist that took care of you is not available. Once you are discharged, your primary care physician will handle any further medical issues. Please note that NO REFILLS for any discharge medications will be authorized once you are discharged, as it is imperative that you return to your primary care physician (or establish a relationship with a primary care physician if you do not have one) for your aftercare needs so that they can reassess your need for medications and monitor your lab values.

## 2015-01-08 NOTE — Discharge Summary (Signed)
Don Brown, is a 59 y.o. male  DOB 1955/09/29  MRN [id number].  Admission date:  01/01/2015  Admitting Physician  Edwin Dada, MD  Discharge Date:  01/08/2015   Primary MD  PROVIDER NOT IN SYSTEM  Recommendations for primary care physician for things to follow:  - Basic labs including CBC CMP during next visit.    Admission Diagnosis  Acute pancreatitis, unspecified pancreatitis type [K85.9]   Discharge Diagnosis  Acute pancreatitis, unspecified pancreatitis type [K85.9]    Active Problems:   Atrial fibrillation (HCC)   Nonischemic cardiomyopathy (HCC)   Pancreatitis, acute   Morbid obesity (HCC)   Chronic combined systolic and diastolic CHF (congestive heart failure) ([place])   Acute pancreatitis   Diabetes mellitus type 2, controlled ([place])   HTN (hypertension), benign   Thrombocytopenia (HCC)      Past Medical History  Diagnosis Date  . 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 Atrial fibrillation ([place])   . Chronic systolic heart failure (HCC)     LVEF 40-45%  . Morbid obesity ([place])   . Chronic pain   . Narcotic dependence, episodic use ([place])   . Respiratory failure ([place])     History of ventilatory-dependent respiratory failure and tracheostomy   . History of renal failure     Requiring hemodialysis in the past with normal renal function at this point  . History of ventral hernia repair   . Cervical compression fracture (HCC)     Cervical disk disease requiring surgery  . Essential hypertension, benign   . Hyperlipidemia   . Degenerative joint disease   . Heart murmur   . MGUS (monoclonal gammopathy of unknown significance)     "or myelona" (05/07/2014)  . On home oxygen therapy     "2L prn" (05/07/2014)  . CHF (congestive heart failure)  ([place])   . Mitral valve prolapse   . Sleep apnea     "don't wear mask" (05/07/2014)  . Type 2 diabetes mellitus ([place])   . Anemia   . History of blood transfusion 2007    "related to OR"  . Headache     "stress"  . Anxiety   . Depression   . Myeloma ([place])     "or MGUS" (05/07/2014)    Past Surgical History  Procedure Laterality Date  . Ventral hernia repair    . Cholecystectomy    . Tracheostomy  2007  . Pancreas surgery      "placed stents; multiple pseudo cysts removal"  . Skin graft    . Insertion of mesh  10/2006  . Hernia repair    . Bone marrow biopsy  02/2014  . Cataract extraction w/ intraocular lens  implant, bilateral Bilateral        History of present illness and  Hospital Course:     Kindly see H&P for history of present illness and admission details, please review  complete Labs, Consult reports and Test reports for all details in brief  HPI  from the history and physical done on the day of admission  Don Brown is a 59 y.o. 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  The patient reports that he was in his usual state of health until today when he developed acute and severe epigastric and left upper quadrant abdominal pain associated with nausea and vomiting. This pain is similar to his previous episodes of pancreatitis. He tried to self medicate at home with available analgesics but this failed.  In the ED, the patient was tachycardic, tachypneic, and somewhat hypoxic. His lipase was elevated to 191, his troponin was elevated to 0.13, and his renal function and WBC were normal. He received 1 L normal saline, trial of BiPAP, and Dilaudid and was admitted to [facility] for pancreatitis.  Hospital Course    Acute on chronic pancreatitis. - Abdominal pain likely secondary  to acute on chronic pancreatitis, patient came in with nausea, vomiting and epigastric abdominal pain.Patient has history of chronic pancreatitis with occasional exacerbations. - levated lipase of 191, improving appears is 19 on discharge. - CT scan of abdomen/pelvis 10/15 showed mild acute on chronic pancreatitis with chronic pseudocyst without changes. - Patient abdominal pain has been improving, started to ask for diet, yesterday was started on clear and full liquid diet, tolerated very well, today has been tolerating low-fat diet and breakfast and lunch.  SIRS Patient developed fever of 101.8 and respiratory rate of 35 with blood pressure of 90/58. Description be secondary to acute pancreatitis versus infectious condition. Urinalysis is negative , Chest x-ray on admission did not show any infiltrates.   Non-STEMI Troponin peak of 0.85, trending down, patient denies any chest pain. Seen by cardiology and recommended Myoview stress test. Patient refused testing, cardiology signed off. Continue beta blockers and low-dose aspirin. Had chest pain again, offered stress testing again, patient refused, troponin trending down, last troponin 0.07.  Atrial fibrillation Rate is controlled with beta blockers. On aspirin 81 mg because of history of medication noncompliance. This patients CHA2DS2-VASc Score and unadjusted Ischemic Stroke Rate (% per year) is equal to 3.2 % stroke rate/year from a score of 3 for CHF, HTN and DM.  Diabetes mellitus type 2 Uncontrolled diabetes mellitus with hemoglobin A1c of 7.7, much better than A1c in February which was 9.7. Resumed on home medication on discharge.  Headache - Unclear etiology, but patient has atrial fibrillation and not on anticoagulation, MRI of the brain ordered, patient refused it, canceled. - Resolved no recurrence  Hypertension - Humeral medication on discharge.  Hyperkalemia/hypokalemia Initially with mild hyperkalemia, that hypokalemia,  repleted, discharged on home dose  Acute renal failure Baseline creatinine is 1.2 from October 11, creatinine increased to 1.7 on admission, back to baseline on discharge, creatinine is 0.95 on discharge     Discharge Condition:  Stable   Follow UP   follow with PCP at [facility]   Discharge Instructions  and  Discharge Medications    Discharge Instructions    Discharge instructions    Complete by:  As directed   Follow with Primary MD PROVIDER NOT IN SYSTEM in 7 days   Get CBC, CMP, 2 view Chest X ray checked  by Primary MD next visit.    Activity: As  tolerated with Full fall precautions use walker/cane & assistance as needed   Disposition Home    Diet: Heart Healthy , low-fat, carbohydrate modified , with feeding assistance and aspiration precautions.  For Heart failure patients - Check your Weight same time everyday, if you gain over 2 pounds, or you develop in leg swelling, experience more shortness of breath or chest pain, call your Primary MD immediately. Follow Cardiac Low Salt Diet and 1.5 lit/day fluid restriction.   On your next visit with your primary care physician please Get Medicines reviewed and adjusted.   Please request your Prim.MD to go over all Hospital Tests and Procedure/Radiological results at the follow up, please get all Hospital records sent to your Prim MD by signing hospital release before you go home.   If you experience worsening of your admission symptoms, develop shortness of breath, life threatening emergency, suicidal or homicidal thoughts you must seek medical attention immediately by calling 911 or calling your MD immediately  if symptoms less severe.  You Must read complete instructions/literature along with all the possible adverse reactions/side effects for all the Medicines you take and that have been prescribed to you. Take any new Medicines after you have completely understood and accpet all the possible adverse reactions/side  effects.   Do not drive, operating heavy machinery, perform activities at heights, swimming or participation in water activities or provide baby sitting services if your were admitted for syncope or siezures until you have seen by Primary MD or a Neurologist and advised to do so again.  Do not drive when taking Pain medications.    Do not take more than prescribed Pain, Sleep and Anxiety Medications  Special Instructions: If you have smoked or chewed Tobacco  in the last 2 yrs please stop smoking, stop any regular Alcohol  and or any Recreational drug use.  Wear Seat belts while driving.   Please note  You were cared for by a hospitalist during your hospital stay. If you have any questions about your discharge medications or the care you received while you were in the hospital after you are discharged, you can call the unit and asked to speak with the hospitalist on call if the hospitalist that took care of you is not available. Once you are discharged, your primary care physician will handle any further medical issues. Please note that NO REFILLS for any discharge medications will be authorized once you are discharged, as it is imperative that you return to your primary care physician (or establish a relationship with a primary care physician if you do not have one) for your aftercare needs so that they can reassess your need for medications and monitor your lab values.     Increase activity slowly    Complete by:  As directed             Medication List    STOP taking these medications        Fish Oil 1000 MG Caps     GREEN TEA PO     ibuprofen 200 MG tablet  Commonly known as:  ADVIL,MOTRIN      TAKE these medications        aspirin 81 MG tablet  Take 81 mg by mouth daily before breakfast.     digoxin 0.125 MG tablet  Commonly known as:  LANOXIN  Take 1 tablet (125 mcg total) by mouth every morning.     folic acid 1 MG tablet  Commonly known as:  [employer]  Take 1  tablet (1 mg total) by mouth daily.     furosemide 40 MG tablet  Commonly known as:  LASIX  Take 1 tablet (40 mg total) by mouth 2 (two) times daily. Hold for the next 3 days, then resume on 01/12/2015  Start taking on:  01/12/2015     gabapentin 600 MG tablet  Commonly known as:  NEURONTIN  Take 600 mg by mouth 3 (three) times daily.     HYDROmorphone 2 MG tablet  Commonly known as:  DILAUDID  Take 2 mg by mouth every 4 (four) hours as needed for severe pain.     insulin aspart 100 UNIT/ML injection  Commonly known as:  NOVOLOG  Before each meal 3 times a day and then bedtime, 140-199 - 2 units, 200-250 - 4 units, 251-299 - 6 units,  300-349 - 8 units,  350 or above 10 units. Dispense syringes and needles as needed, Ok to switch to PEN if approved. Okay to switch to any approved short-acting insulin.     insulin glargine 100 UNIT/ML injection  Commonly known as:  LANTUS  Inject 20-40 Units into the skin at bedtime. Takes 20 - 40 units depending on blood sugar reading. Confirmed with pt.     lisinopril 10 MG tablet  Commonly known as:  PRINIVIL,ZESTRIL  Take 1 tablet (10 mg total) by mouth daily.     metFORMIN 500 MG tablet  Commonly known as:  GLUCOPHAGE  Take 500 mg by mouth 2 (two) times daily with a meal.     metoprolol succinate 50 MG 24 hr tablet  Commonly known as:  TOPROL-XL  Take 1 tablet (50 mg total) by mouth every morning.     ondansetron 4 MG tablet  Commonly known as:  ZOFRAN  Take 1 tablet (4 mg total) by mouth every 8 (eight) hours as needed for nausea or vomiting.     Oxycodone HCl 10 MG Tabs  Take 1 tablet (10 mg total) by mouth every 6 (six) hours as needed for severe pain.     PANCREASE PO  Take 2 capsules by mouth 3 (three) times daily with meals.     pantoprazole 40 MG tablet  Commonly known as:  PROTONIX  Take 1 tablet (40 mg total) by mouth 2 (two) times daily.     potassium chloride SA 20 MEQ tablet  Commonly known as:  K-DUR,KLOR-CON  Take  2 tablets (40 mEq total) by mouth daily.     promethazine 25 MG tablet  Commonly known as:  PHENERGAN  Take 25 mg by mouth every 8 (eight) hours as needed for nausea or vomiting.     thiamine 100 MG tablet  Take 1 tablet (100 mg total) by mouth daily.     VITAMIN B 12 PO  Take 1 tablet by mouth daily.     Vitamin D (Ergocalciferol) 50000 UNITS Caps capsule  Commonly known as:  DRISDOL  Take 50,000 Units by mouth every 7 (seven) days.          Diet and Activity recommendation: See Discharge Instructions above   Consults obtained -  None   Major procedures and Radiology Reports - PLEASE review detailed and final reports for all details, in brief -      Ct Abdomen Pelvis Wo Contrast  01/05/2015  CLINICAL DATA:  Abdominal pain, nausea EXAM: CT ABDOMEN AND PELVIS WITHOUT CONTRAST TECHNIQUE: Multidetector CT imaging of the abdomen and pelvis was performed following the standard protocol without  IV contrast. COMPARISON:  CTA abdomen pelvis dated 05/01/2013 FINDINGS: Lower chest: Scattered areas of linear scarring/atelectasis in the right middle lobe and bilateral lower lobes. Hepatobiliary: Scattered hepatic cysts, including a 3.0 cm cyst anteriorly along the right liver (series 2/ image 27). Status post cholecystectomy. No intrahepatic or extrahepatic ductal dilatation. Pancreas: Stable appearance of the pancreas with irregular ductal dilatation in the pancreatic body/ tail, associated pancreatic atrophy, and a stable 2.5 cm fluid collection along the posterior aspect of the pancreatic body (series 2/ image 41). Associated parenchymal calcifications. This appearance likely reflects sequela of prior/ chronic pancreatitis with a dominant pseudocyst. Superimposed mild acute peripancreatic inflammatory stranding is suspected (series 2/ image 44). Spleen: Within normal limits. Adrenals/Urinary Tract: Adrenal glands are within normal limits. Kidneys are within normal limits. No renal,  ureteral, or bladder calculi. Bladder is within normal limits. Stomach/Bowel: Stomach is within normal limits. No evidence of bowel obstruction. Normal appendix (series 2/ image 66). Vascular/Lymphatic: No evidence of abdominal aortic aneurysm. Small retroperitoneal lymph nodes which do not meet pathologic CT size criteria. Reproductive: Mild prostatomegaly. Other: No abdominopelvic ascites. Laxity of the anterior abdominal wall/large ventral hernia (series 2/ image 15). Musculoskeletal: Degenerative changes of the visualized thoracolumbar spine. IMPRESSION: Suspected mild acute on chronic pancreatitis. Stable 2.5 cm fluid collection along the posterior aspect of the pancreatic body is favored to reflect a pseudocyst, unchanged from 2015. No evidence of bowel obstruction.  Normal appendix. Additional stable ancillary findings as above. Electronically Signed   By: Julian Hy M.D.   On: 01/05/2015 13:16   Dg Chest Port 1 View  01/01/2015  CLINICAL DATA:  Acute shortness of breath and chest pain. EXAM: PORTABLE CHEST 1 VIEW COMPARISON:  07/26/2014 and prior radiographs FINDINGS: Cardiomegaly, pulmonary vascular congestion and mild bibasilar atelectasis noted. This is a low volume film. There is no evidence of focal airspace disease, pulmonary edema, suspicious pulmonary nodule/mass, pleural effusion, or pneumothorax. No acute bony abnormalities are identified. IMPRESSION: Cardiomegaly with pulmonary vascular congestion and bibasilar atelectasis. Electronically Signed   By: Margarette Canada M.D.   On: 01/01/2015 22:46    Micro Results    Recent Results (from the past 240 hour(s))  MRSA PCR Screening     Status: None   Collection Time: 01/02/15  6:42 AM  Result Value Ref Range Status   MRSA by PCR NEGATIVE NEGATIVE Final    Comment:        The GeneXpert MRSA Assay (FDA approved for NASAL specimens only), is one component of a comprehensive MRSA colonization surveillance program. It is not intended  to diagnose MRSA infection nor to guide or monitor treatment for MRSA infections.   Culture, blood (routine x 2)     Status: None   Collection Time: 01/02/15 11:05 PM  Result Value Ref Range Status   Specimen Description BLOOD LEFT HAND  Final   Special Requests AEROBIC BOTTLE ONLY 7ML  Final   Culture NO GROWTH 5 DAYS  Final   Report Status 01/08/2015 FINAL  Final  Culture, blood (routine x 2)     Status: None   Collection Time: 01/02/15 11:20 PM  Result Value Ref Range Status   Specimen Description BLOOD RIGHT HAND  Final   Special Requests AEROBIC BOTTLE ONLY 5ML  Final   Culture NO GROWTH 5 DAYS  Final   Report Status 01/08/2015 FINAL  Final       Today   Subjective:   Don Brown today has no headache,no chest abdominal pain,no  new weakness tingling or numbness, feels much better wants to go home today.   Objective:   Blood pressure 150/90, pulse 98, temperature 98 F (36.7 C), temperature source Oral, resp. rate 20, height '5\' 11"'  (1.803 m), weight 152.409 kg (336 lb), SpO2 95 %.   Intake/Output Summary (Last 24 hours) at 01/08/15 1434 Last data filed at 01/08/15 0820  Gross per 24 hour  Intake   1200 ml  Output      0 ml  Net   1200 ml    Exam General: Alert and awake, oriented x3, not in any acute distress. HEENT: anicteric sclera, pupils reactive to light and accommodation, EOMI CVS: S1-S2 clear, no murmur rubs or gallops Chest: clear to auscultation bilaterally, no wheezing, rales or rhonchi Abdomen: soft nontender, nondistended, normal bowel sounds, no organomegaly Extremities: no cyanosis, clubbing or edema noted bilaterally Neuro: Cranial nerves II-XII intact, no focal neurological deficits  Data Review   CBC w Diff: Lab Results  Component Value Date   WBC 4.5 01/05/2015   WBC 3.2* 07/30/2011   HGB 10.8* 01/05/2015   HGB 14.2 07/30/2011   HCT 37.0* 01/05/2015   HCT 44.5 07/30/2011   PLT 110* 01/05/2015   PLT 138* 07/30/2011   LYMPHOPCT 15  07/26/2014   LYMPHOPCT 29.8 07/30/2011   MONOPCT 6 07/26/2014   MONOPCT 6.4 07/30/2011   EOSPCT 2 07/26/2014   EOSPCT 3.6 07/30/2011   BASOPCT 0 07/26/2014   BASOPCT 1.1 07/30/2011    CMP: Lab Results  Component Value Date   NA 143 01/08/2015   K 3.1* 01/08/2015   CL 110 01/08/2015   CO2 23 01/08/2015   BUN 9 01/08/2015   CREATININE 0.95 01/08/2015   PROT 8.1 01/02/2015   ALBUMIN 2.9* 01/07/2015   BILITOT 0.8 01/02/2015   ALKPHOS 50 01/02/2015   AST 28 01/02/2015   ALT 15* 01/02/2015  .   Total Time in preparing paper work, data evaluation and todays exam - 35 minutes  Don Brown M.D on 01/08/2015 at 2:34 PM  [facility]   Office  [phone]

## 2016-01-22 ENCOUNTER — Emergency Department (HOSPITAL_COMMUNITY)
Admission: EM | Admit: 2016-01-22 | Discharge: 2016-01-22 | Discharge status: Against Medical Advice | Payer: Non-veteran care | Attending: Emergency Medicine | Admitting: Emergency Medicine

## 2016-01-22 ENCOUNTER — Emergency Department (HOSPITAL_COMMUNITY): Payer: Non-veteran care

## 2016-01-22 ENCOUNTER — Other Ambulatory Visit: Payer: Self-pay

## 2016-01-22 ENCOUNTER — Encounter (HOSPITAL_COMMUNITY): Payer: Self-pay | Admitting: Emergency Medicine

## 2016-01-22 DIAGNOSIS — Z794 Long term (current) use of insulin: Secondary | ICD-10-CM | POA: Insufficient documentation

## 2016-01-22 DIAGNOSIS — K861 Other chronic pancreatitis: Secondary | ICD-10-CM | POA: Diagnosis not present

## 2016-01-22 DIAGNOSIS — I5023 Acute on chronic systolic (congestive) heart failure: Secondary | ICD-10-CM | POA: Insufficient documentation

## 2016-01-22 DIAGNOSIS — R1013 Epigastric pain: Secondary | ICD-10-CM

## 2016-01-22 DIAGNOSIS — E119 Type 2 diabetes mellitus without complications: Secondary | ICD-10-CM | POA: Insufficient documentation

## 2016-01-22 DIAGNOSIS — Z79899 Other long term (current) drug therapy: Secondary | ICD-10-CM | POA: Diagnosis not present

## 2016-01-22 DIAGNOSIS — Z7982 Long term (current) use of aspirin: Secondary | ICD-10-CM | POA: Diagnosis not present

## 2016-01-22 DIAGNOSIS — R9431 Abnormal electrocardiogram [ECG] [EKG]: Secondary | ICD-10-CM | POA: Insufficient documentation

## 2016-01-22 DIAGNOSIS — I110 Hypertensive heart disease with heart failure: Secondary | ICD-10-CM | POA: Diagnosis not present

## 2016-01-22 LAB — LIPASE, BLOOD: Lipase: 17 U/L (ref 11–51)

## 2016-01-22 LAB — COMPREHENSIVE METABOLIC PANEL
ALBUMIN: 3.7 g/dL (ref 3.5–5.0)
ALK PHOS: 52 U/L (ref 38–126)
ALT: 13 U/L — AB (ref 17–63)
AST: 16 U/L (ref 15–41)
Anion gap: 7 (ref 5–15)
BILIRUBIN TOTAL: 1 mg/dL (ref 0.3–1.2)
BUN: 21 mg/dL — AB (ref 6–20)
CALCIUM: 9.3 mg/dL (ref 8.9–10.3)
CO2: 25 mmol/L (ref 22–32)
CREATININE: 1.11 mg/dL (ref 0.61–1.24)
Chloride: 108 mmol/L (ref 101–111)
GFR calc Af Amer: >60 mL/min (ref >60)
GFR calc non Af Amer: >60 mL/min (ref >60)
GLUCOSE: 184 mg/dL — AB (ref 65–99)
Potassium: 4.2 mmol/L (ref 3.5–5.1)
Sodium: 140 mmol/L (ref 135–145)
TOTAL PROTEIN: 8.9 g/dL — AB (ref 6.5–8.1)

## 2016-01-22 LAB — CBC
HEMATOCRIT: 40.4 % (ref 39.0–52.0)
HEMOGLOBIN: 12.9 g/dL — AB (ref 13.0–17.0)
MCH: 27.8 pg (ref 26.0–34.0)
MCHC: 31.9 g/dL (ref 30.0–36.0)
MCV: 87.1 fL (ref 78.0–100.0)
Platelets: 202 10*3/uL (ref 150–400)
RBC: 4.64 MIL/uL (ref 4.22–5.81)
RDW: 14.7 % (ref 11.5–15.5)
WBC: 5.4 10*3/uL (ref 4.0–10.5)

## 2016-01-22 LAB — I-STAT TROPONIN, ED
Troponin i, poc: 0.01 ng/mL (ref 0.00–0.08)
Troponin i, poc: 0 ng/mL (ref 0.00–0.08)

## 2016-01-22 LAB — URINALYSIS, ROUTINE W REFLEX MICROSCOPIC
Glucose, UA: NEGATIVE mg/dL
Hgb urine dipstick: NEGATIVE
Ketones, ur: NEGATIVE mg/dL
Leukocytes, UA: NEGATIVE
Nitrite: NEGATIVE
PH: 5.5 (ref 5.0–8.0)
Protein, ur: 100 mg/dL — AB
SPECIFIC GRAVITY, URINE: 1.03 (ref 1.005–1.030)

## 2016-01-22 LAB — URINE MICROSCOPIC-ADD ON

## 2016-01-22 LAB — PROTIME-INR
INR: 0.92
PROTHROMBIN TIME: 12.3 s (ref 11.4–15.2)

## 2016-01-22 LAB — BRAIN NATRIURETIC PEPTIDE: B Natriuretic Peptide: 93.7 pg/mL (ref 0.0–100.0)

## 2016-01-22 LAB — I-STAT CG4 LACTIC ACID, ED
LACTIC ACID, VENOUS: 1.12 mmol/L (ref 0.5–1.9)
Lactic Acid, Venous: 0.71 mmol/L (ref 0.5–1.9)

## 2016-01-22 MED ORDER — OXYCODONE-ACETAMINOPHEN 5-325 MG PO TABS
2.0000 | ORAL_TABLET | Freq: Once | ORAL | Status: AC
  Administered 2016-01-22: 2 via ORAL
  Filled 2016-01-22: qty 2

## 2016-01-22 MED ORDER — HYDROMORPHONE HCL 1 MG/ML IJ SOLN
1.0000 mg | Freq: Once | INTRAMUSCULAR | Status: AC
  Administered 2016-01-22: 1 mg via INTRAVENOUS
  Filled 2016-01-22: qty 1

## 2016-01-22 MED ORDER — GI COCKTAIL ~~LOC~~
30.0000 mL | Freq: Once | ORAL | Status: DC
  Filled 2016-01-22: qty 30

## 2016-01-22 MED ORDER — ONDANSETRON HCL 4 MG PO TABS: 4.0000 mg | ORAL_TABLET | Freq: Three times a day (TID) | ORAL | 0 refills | Status: DC | PRN

## 2016-01-22 MED ORDER — ONDANSETRON HCL 4 MG/2ML IJ SOLN
4.0000 mg | Freq: Once | INTRAMUSCULAR | Status: AC | PRN
  Administered 2016-01-22: 4 mg via INTRAVENOUS
  Filled 2016-01-22: qty 2

## 2016-01-22 MED ORDER — HYDROCODONE-ACETAMINOPHEN 5-325 MG PO TABS: 1.0000 | ORAL_TABLET | Freq: Four times a day (QID) | ORAL | 0 refills | Status: DC | PRN

## 2016-01-22 MED ORDER — IOPAMIDOL (ISOVUE-300) INJECTION 61%: 100.0000 mL | Freq: Once | INTRAVENOUS | Status: DC | PRN

## 2016-01-22 MED ORDER — ASPIRIN 81 MG PO CHEW
324.0000 mg | CHEWABLE_TABLET | Freq: Once | ORAL | Status: AC
  Administered 2016-01-22: 324 mg via ORAL
  Filled 2016-01-22: qty 4

## 2016-01-22 NOTE — ED Triage Notes (Signed)
Pt comes to ed via ems, c/o chronic pancreatis's, pain verbalized 10. Pt reports vomiting 3 times in last 24 hrs.  V/s on arrival 146/86, pulse 74, rr 22, 99 room air. Wears 02 as needed. cbg 131.  Allergy to morphine. Comes from home.

## 2016-01-22 NOTE — ED Notes (Signed)
Social worker at bedside.

## 2016-01-22 NOTE — Discharge Instructions (Signed)
As we discussed, based on your abnormal EKG, I am concerned for possible heart damage and would recommend admission. This could be signs of an early or developing heart attack. It could also sign worsening heart failure, which could lead to major heart attack and/or death.  You need to follow-up with your doctor in the next several days as well as see a cardiologist. I would recommend a repeat EKG, echocardiogram, and possible stress testing.  It is very important that you take your medications as prescribed.  If you change your mind and would like to be admitted or further evaluated for your pain, return to the ER immediately.

## 2016-01-22 NOTE — ED Notes (Signed)
Bed: [id number] Expected date:  Expected time:  Means of arrival:  Comments: EMS pancreatitis

## 2016-01-22 NOTE — ED Notes (Addendum)
Once again pt is refusing to leave, now he sts he wants to speak with social work, however he doesn't want to wait and is requesting that social worker calls [facility] and schedule a follow up appointment for him. He also wants Dr Ellender Hose to give him rx for O2. Dr Ellender Hose stated he will have to get that rx from his primary provider. At this point pt is agitated again and  verbally abusive. Jen AD at bedside.

## 2016-01-22 NOTE — ED Notes (Addendum)
Per Dr Ellender Hose pt is leaving AMA

## 2016-01-22 NOTE — ED Provider Notes (Signed)
[place] DEPT Provider Note   CSN: [id number] Arrival date & time: 01/22/16  0257   By signing my name below, I, Don Brown, attest that this documentation has been prepared under the direction and in the presence of Don Bruce, MD . Electronically Signed: Neta Brown, ED Scribe. 01/22/2016. 3:48 AM.   History   Chief Complaint Chief Complaint  Patient presents with  . Pancreatitis    The history is provided by the patient. No language interpreter was used.   HPI Comments:  Don Brown is a 60 y.o. male with PMHx of chronic pancreatitis, DM, and CHF who presents to the Emergency Department complaining of intermittent pancreatitis pain that began yesterday. The patient states that his symptoms started several hours ago. He describes the pain as a sharp, constant, aching pain that is in his epigastric area and radiates to his back. He ate spicy foods last night but denies any other recent food or medication changes. Pain is made worse with palpation and eating. No alleviating factors. Of note, patient also endorses progressively worsening shortness of breath over the last several weeks. He has seen his primary for this and is being referred to his cardiologist, but states he is having difficulty due to his [place] insurance. He denies any current chest pain.   Past Medical History:  Diagnosis Date  . Anemia   . Anxiety   . Atrial fibrillation ([place])   . Cervical compression fracture (HCC)    Cervical disk disease requiring surgery  . CHF (congestive heart failure) ([place])   . Chronic pain   . Chronic systolic heart failure (HCC)    LVEF 40-45%  . Degenerative joint disease   . Depression   . Essential hypertension, benign   . Headache    "stress"  . Heart murmur   . History of blood transfusion 2007   "related to OR"  . History of renal failure    Requiring hemodialysis in the past with normal renal function at this point  . History of ventral hernia repair    . Hyperlipidemia   . MGUS (monoclonal gammopathy of unknown significance)    "or myelona" (05/07/2014)  . Mitral valve prolapse   . Morbid obesity ([place])   . Myeloma ([place])    "or MGUS" (05/07/2014)  . Narcotic dependence, episodic use ([place])   . On home oxygen therapy    "2L prn" (05/07/2014)  . 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 Respiratory failure ([place])    History of ventilatory-dependent respiratory failure and tracheostomy   . Sleep apnea    "don't wear mask" (05/07/2014)  . Type 2 diabetes mellitus [facility])     Patient Active Problem List   Diagnosis Date Noted  . Thrombocytopenia ([place]) 01/05/2015  . Chronic pain 05/07/2014  . Type 2 diabetes mellitus ([place]) 05/07/2014  . Bradycardia 05/07/2014  . Streptococcal infection 05/07/2014  . Abdominal pain 05/06/2014  . HTN (hypertension), benign   . Chest pain 02/05/2014  . Chronic atrial fibrillation ([place]) 02/05/2014  . Diabetes mellitus type 2, controlled ([place]) 02/05/2014  . Pancreatitis 10/03/2013  . 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 Acute pancreatitis 10/03/2013  . Acute on chronic combined systolic and diastolic CHF (congestive heart failure) ([place]) 07/07/2013  . Morbid obesity ([place]) 07/07/2013  . Chronic combined systolic and diastolic CHF (congestive heart failure) ([place]) 07/07/2013  . Hyperkalemia 05/14/2013  .  AKI (acute kidney injury) ([place]) 05/13/2013  . Pancreatitis, acute 05/10/2013  . Fever 05/03/2013  . Hyperosmolar non-ketotic state in patient with type 2 diabetes mellitus ([place]) 05/01/2013  . Lactic acidosis 05/01/2013  . Delirium, acute 05/01/2013  . Chronic pancreatitis ([place]) 05/01/2013  . Acute respiratory failure ([place]) 05/01/2013  . Recurrent incisional hernia with obstruction 12/29/2012  . Nonischemic cardiomyopathy ([place]) 07/14/2012  . Atrial fibrillation ([place])  01/14/2011  . Acute on chronic combined systolic and diastolic heart failure 67/34/1938  . Valvular heart disease 01/14/2011    Past Surgical History:  Procedure Laterality Date  . BONE MARROW BIOPSY  02/2014  . CATARACT EXTRACTION W/ INTRAOCULAR LENS  IMPLANT, BILATERAL Bilateral   . CHOLECYSTECTOMY    . HERNIA REPAIR    . INSERTION OF MESH  10/2006  . PANCREAS SURGERY     "placed stents; multiple pseudo cysts removal"  . SKIN GRAFT    . TRACHEOSTOMY  2007  . VENTRAL HERNIA REPAIR         Home Medications    Prior to Admission medications   Medication Sig Start Date End Date Taking? Authorizing Provider  APPLE CIDER VINEGAR PO Take 1 tablet by mouth 3 (three) times daily.   Yes Historical Provider, MD  aspirin 81 MG tablet Take 81 mg by mouth daily before breakfast.    Yes Historical Provider, MD  Cyanocobalamin (VITAMIN B 12 PO) Take 1 tablet by mouth daily.    Yes Historical Provider, MD  digoxin (LANOXIN) 0.125 MG tablet Take 1 tablet (125 mcg total) by mouth every morning. 02/12/14  Yes Eugenie Filler, MD  folic acid (FOLVITE) 1 MG tablet Take 1 tablet (1 mg total) by mouth daily. 05/17/13  Yes Thurnell Lose, MD  furosemide (LASIX) 40 MG tablet Take 1 tablet (40 mg total) by mouth 2 (two) times daily. Hold for the next 3 days, then resume on 01/12/2015 Patient taking differently: Take 40 mg by mouth daily. Hold for the next 3 days, then resume on 01/12/2015 01/12/15  Yes Dawood S Elgergawy, MD  gabapentin (NEURONTIN) 600 MG tablet Take 600 mg by mouth 3 (three) times daily.   Yes Historical Provider, MD  HYDROmorphone (DILAUDID) 2 MG tablet Take 2 mg by mouth every 4 (four) hours as needed for severe pain.   Yes Historical Provider, MD  insulin aspart (NOVOLOG) 100 UNIT/ML injection Before each meal 3 times a day and then bedtime, 140-199 - 2 units, 200-250 - 4 units, 251-299 - 6 units,  300-349 - 8 units,  350 or above 10 units. Dispense syringes and needles as needed,  Ok to switch to PEN if approved. Okay to switch to any approved short-acting insulin. 05/17/13  Yes Thurnell Lose, MD  insulin glargine (LANTUS) 100 UNIT/ML injection Inject 20-40 Units into the skin at bedtime. Takes 20 - 40 units depending on blood sugar reading. Confirmed with pt.   Yes Historical Provider, MD  metFORMIN (GLUCOPHAGE) 500 MG tablet Take 500 mg by mouth 2 (two) times daily with a meal.   Yes Historical Provider, MD  metoprolol succinate (TOPROL-XL) 50 MG 24 hr tablet Take 1 tablet (50 mg total) by mouth every morning. 02/12/14  Yes Eugenie Filler, MD  Misc Natural Products [facility] COMPLEX PO) Take 1 tablet by mouth daily.   Yes Historical Provider, MD  Omega-3 1000 MG CAPS Take 1 capsule by mouth daily.   Yes Historical Provider, MD  ondansetron (ZOFRAN) 4 MG tablet Take 1 tablet (  4 mg total) by mouth every 8 (eight) hours as needed for nausea or vomiting. 07/26/14  Yes Dahlia Bailiff, PA-C  oxyCODONE 10 MG TABS Take 1 tablet (10 mg total) by mouth every 6 (six) hours as needed for severe pain. 10/06/13  Yes Jerrye Noble, MD  Pancrelipase, Lip-Prot-Amyl, 24000-76000 units CPEP Take 24,000 Units by mouth 3 (three) times daily.   Yes Historical Provider, MD  pantoprazole (PROTONIX) 40 MG tablet Take 1 tablet (40 mg total) by mouth 2 (two) times daily. 05/17/13  Yes Thurnell Lose, MD  potassium chloride SA (K-DUR,KLOR-CON) 20 MEQ tablet Take 2 tablets (40 mEq total) by mouth daily. Patient taking differently: Take 20 mEq by mouth daily.  02/12/14  Yes Eugenie Filler, MD  sildenafil (VIAGRA) 100 MG tablet Take 100 mg by mouth daily as needed for erectile dysfunction.   Yes Historical Provider, MD  simvastatin (ZOCOR) 80 MG tablet Take 80 mg by mouth daily.   Yes Historical Provider, MD  thiamine 100 MG tablet Take 1 tablet (100 mg total) by mouth daily. 05/17/13  Yes Thurnell Lose, MD  TURMERIC PO Take 1 tablet by mouth daily.   Yes Historical Provider, MD    HYDROcodone-acetaminophen (NORCO/VICODIN) 5-325 MG tablet Take 1 tablet by mouth every 6 (six) hours as needed for moderate pain. 01/22/16   Don Bruce, MD  ondansetron (ZOFRAN) 4 MG tablet Take 1 tablet (4 mg total) by mouth every 8 (eight) hours as needed for nausea or vomiting. 01/22/16   Don Bruce, MD    Family History Family History  Problem Relation Age of Onset  . Lymphoma Father   . Hypertension Mother   . Diabetes Mother     Social History Social History  Substance Use Topics  . Smoking status: Never Smoker  . Smokeless tobacco: Never Used  . Alcohol use No     Allergies   Morphine and related   Review of Systems Review of Systems  Constitutional: Positive for fatigue. Negative for chills and fever.  HENT: Negative for congestion and rhinorrhea.   Eyes: Negative for visual disturbance.  Respiratory: Positive for shortness of breath. Negative for cough and wheezing.   Cardiovascular: Negative for chest pain and leg swelling.  Gastrointestinal: Positive for abdominal distention, abdominal pain, nausea and vomiting. Negative for diarrhea.  Genitourinary: Negative for dysuria and flank pain.  Musculoskeletal: Negative for neck pain and neck stiffness.  Skin: Negative for rash and wound.  Allergic/Immunologic: Negative for immunocompromised state.  Neurological: Negative for syncope, weakness and headaches.  All other systems reviewed and are negative.    Physical Exam Updated Vital Signs BP 142/92 (BP Location: Right Arm)   Pulse 90   Temp 98.6 F (37 C) (Oral)   Resp 19   Ht 6' (1.829 m)   Wt (!) 325 lb (147.4 kg)   SpO2 98%   BMI 44.08 kg/m   Physical Exam  Constitutional: He is oriented to person, place, and time. He appears well-developed and well-nourished. No distress.  HENT:  Head: Normocephalic and atraumatic.  Eyes: Conjunctivae are normal.  Neck: Neck supple.  Cardiovascular: Normal rate, regular rhythm and normal heart sounds.   Exam reveals no friction rub.   No murmur heard. Pulmonary/Chest: Effort normal and breath sounds normal. No respiratory distress. He has no wheezes. He has no rhonchi. He has no rales.  Abdominal: Soft. Normal appearance. He exhibits distension. He exhibits no ascites. There is tenderness in the epigastric area. There is  no rigidity, no rebound and no guarding.  Musculoskeletal: He exhibits no edema.  Neurological: He is alert and oriented to person, place, and time. He exhibits normal muscle tone.  Skin: Skin is warm. Capillary refill takes less than 2 seconds.  Psychiatric: He has a normal mood and affect.  Nursing note and vitals reviewed.    ED Treatments / Results  DIAGNOSTIC STUDIES:  Oxygen Saturation is 94% on RA, normal  by my interpretation.    COORDINATION OF CARE:  3:48 AM Discussed treatment plan with pt at bedside and pt agreed to plan.   Labs (all labs ordered are listed, but only abnormal results are displayed) Labs Reviewed  CBC - Abnormal; Notable for the following:       Result Value   Hemoglobin 12.9 (*)    All other components within normal limits  URINALYSIS, ROUTINE W REFLEX MICROSCOPIC (NOT AT [facility]) - Abnormal; Notable for the following:    Color, Urine AMBER (*)    Bilirubin Urine SMALL (*)    Protein, ur 100 (*)    All other components within normal limits  COMPREHENSIVE METABOLIC PANEL - Abnormal; Notable for the following:    Glucose, Bld 184 (*)    BUN 21 (*)    Total Protein 8.9 (*)    ALT 13 (*)    All other components within normal limits  URINE MICROSCOPIC-ADD ON - Abnormal; Notable for the following:    Squamous Epithelial / LPF 0-5 (*)    Bacteria, UA FEW (*)    All other components within normal limits  PROTIME-INR  BRAIN NATRIURETIC PEPTIDE  LIPASE, BLOOD  I-STAT CG4 LACTIC ACID, ED  I-STAT TROPOININ, ED  I-STAT CG4 LACTIC ACID, ED  I-STAT TROPOININ, ED    EKG  EKG Interpretation None      Atrial fibrillation, rate  controlled. QTc wnl. Diffuse ST wave changes and TWI in inferolateral leads, c/f diffuse ischemia versus LVH with repol.      Radiology Ct Abdomen Pelvis Wo Contrast  Result Date: 01/22/2016 CLINICAL DATA:  Epigastric pain EXAM: CT ABDOMEN AND PELVIS WITHOUT CONTRAST TECHNIQUE: Multidetector CT imaging of the abdomen and pelvis was performed following the standard protocol without IV contrast. COMPARISON:  01/05/2015 FINDINGS: Lower chest: Stable linear scarring in the bases. No consolidation or effusions. Hepatobiliary: Unchanged benign hepatic cysts. No suspicious focal liver lesion. Cholecystectomy. No bile duct dilatation. Pancreas: Extensive calcifications, parenchymal atrophy and duct dilatation consistent with chronic pancreatitis. This is unchanged. No evidence of superimposed acute pancreatitis. Spleen: Normal in size without focal abnormality. Adrenals/Urinary Tract: Adrenal glands are unremarkable. Kidneys are normal, without renal calculi, focal lesion, or hydronephrosis. Bladder is unremarkable. Stomach/Bowel: Mild dilatation of small bowel, without abrupt caliber transition. Stomach and colon are unremarkable. Appendix is normal. Vascular/Lymphatic: Normal caliber abdominal aorta. Mild atherosclerotic calcification of the common iliac arteries. Reproductive: Prostate is unremarkable. Other: Extensive ventral hernias, unchanged. Unobstructed small and large bowel is present within the hernias. Musculoskeletal: No significant skeletal lesions. Degenerative lumbar disc disease is present at multiple levels. IMPRESSION: 1. No acute findings are evident in the abdomen or pelvis. 2. Nonspecific mild dilatation of small bowel without evidence of obstruction. 3. Extensive ventral hernia.  Unchanged. 4. Chronic pancreatitis.  No evidence of acute pancreatitis. Electronically Signed   By: Andreas Newport M.D.   On: 01/22/2016 06:13   Dg Chest 2 View  Result Date: 01/22/2016 CLINICAL DATA:  Chest  pain.  Shortness of breath. EXAM: CHEST  2 VIEW  COMPARISON:  01/01/2015 . FINDINGS: Severe cardiomegaly again noted. Mild pulmonary vascular prominence and bilateral interstitial prominence consistent mild congestive heart failure. Tiny left pleural effusion cannot be excluded. No pneumothorax. IMPRESSION: Severe cardiomegaly again noted. Mild pulmonary venous congestion interstitial prominence noted suggesting mild congestive heart failure. Tiny left pleural effusion cannot be excluded . Electronically Signed   By: Marcello Moores  Register   On: 01/22/2016 07:19    Procedures Procedures (including critical care time)  Medications Ordered in ED Medications  iopamidol (ISOVUE-300) 61 % injection 100 mL (not administered)  gi cocktail (Maalox,Lidocaine,Donnatal) (30 mLs Oral Refused 01/22/16 0636)  ondansetron (ZOFRAN) injection 4 mg (4 mg Intravenous Given 01/22/16 0415)  HYDROmorphone (DILAUDID) injection 1 mg (1 mg Intravenous Given 01/22/16 0414)  HYDROmorphone (DILAUDID) injection 1 mg (1 mg Intravenous Given 01/22/16 0537)  oxyCODONE-acetaminophen (PERCOCET/ROXICET) 5-325 MG per tablet 2 tablet (2 tablets Oral Given 01/22/16 0636)  aspirin chewable tablet 324 mg (324 mg Oral Given 01/22/16 1610)     Initial Impression / Assessment and Plan / ED Course  I have reviewed the triage vital signs and the nursing notes.  Pertinent labs & imaging results that were available during my care of the patient were reviewed by me and considered in my medical decision making (see chart for details).  Clinical Course    60 year old male with extensive past medical history as above who presents with epigastric abdominal pain. On arrival, his vital signs are stable.examination is as above. Patient also incidentally notes increasing shortness of breath with exertion.  Regarding his abdominal pain, suspect acute on chronic pancreatitis. Initial lab work is overall reassuring with normal white blood cell count,baseline  hemoglobin, and baseline renal function. Lipase is normal, but in setting of chronic pancreatitis this is difficult to assess. Given his significant tenderness, CT scan obtained and shows no acute abnormalities. No evidence of bowel obstruction. No evidence of necrotic or hemorrhagic pancreatitis. His symptoms are improved with IV analgesia. I'm hesitant to give him IV fluids in setting of CHF.He is now tolerating by mouth without difficulty and believe he is able to be discharged from this standpoint.  Regarding his shortness of breath with exertion, EKG shows persistent A. Fib. Of note, he has new, diffuse T-wave inversions. It is difficult to assess the acuity of these findings as last EKG was one year ago. However, his chest x-ray does show mild pulmonary edema and I'm concerned that he has likely acute on chronic CHF, with concern for ACS. I discussed these findings with the patient in detail. He is adamant that he is here for his pancreatitis only. I discussed my concerns that he could be having a heart attack or worsening heart failure, with subsequent death, and he declines admission for this. He states that he will follow-up with a cardiologist as an outpatient, but he has multiple things to do at home and declines admission.He is awake, alert, and demonstrates capacity for this decision. He is also competent. I discussed my concerns, as well as possible future heart attack, heart failure, and/or death. I also discussed patient's labs with him. Greater than 30 minutes was spent discussing labs, findings with patient in my recommendations for admission and patient declines. He states he would not desire any intervention. Will refer him to an outpatient cardiologist.  Final Clinical Impressions(s) / ED Diagnoses   Final diagnoses:  Abnormal EKG  Other chronic pancreatitis ([place])  Acute on chronic systolic congestive heart failure [facility])    New Prescriptions New Prescriptions  HYDROCODONE-ACETAMINOPHEN (NORCO/VICODIN) 5-325 MG TABLET    Take 1 tablet by mouth every 6 (six) hours as needed for moderate pain.   ONDANSETRON (ZOFRAN) 4 MG TABLET    Take 1 tablet (4 mg total) by mouth every 8 (eight) hours as needed for nausea or vomiting.    I personally performed the services described in this documentation, which was scribed in my presence. The recorded information has been reviewed and is accurate.     Don Bruce, MD 01/22/16 [phone]

## 2016-01-22 NOTE — ED Notes (Signed)
When presented with discharge paperwork, pt start arguing that he is not leaving AMA, and that he was not here for his heart but his pancreas, he sts "If dr is concerned about my heart why doesn't he give me heart monitor to wear at home". He is very argumentative and requesting to speak with dr again. Dr Ellender Hose notified

## 2016-01-22 NOTE — Progress Notes (Signed)
Call received from Nurse stating patient wanted to speak with social work. Spoke with patient at bedside with two family members present that were both asleep sitting in chairs. Patient began to talk about his medical issues and about him informing staff that he was leaving AMA. Patient stated he wanted CSW to contact social worker at the [place] and patient was informed that he would need to call his social worker and coordinate any services. Patient continued to go on and on medical issues and wanting to speak with a patient advocate. Patient's daughter was asleep and he woke her up and asked her if she was listening. She attempted to explain to [place] about calling Education officer, museum at [place]. CSW explained that patient would need make contact with his Education officer, museum.  CSW staffed with Nurse and then Asst. ED Director and informed her of the conversation with patient.  Merry Proud, LCSWA  01/22/2016 10:51 AM

## 2016-03-28 IMAGING — CR DG CHEST 1V PORT
1 series · 1 of 1 positions shown · non-contrast
Comparison: 07/26/2014 and prior radiographs

CLINICAL DATA: Acute shortness of breath and chest pain.

EXAM:
PORTABLE CHEST 1 VIEW

[AP]
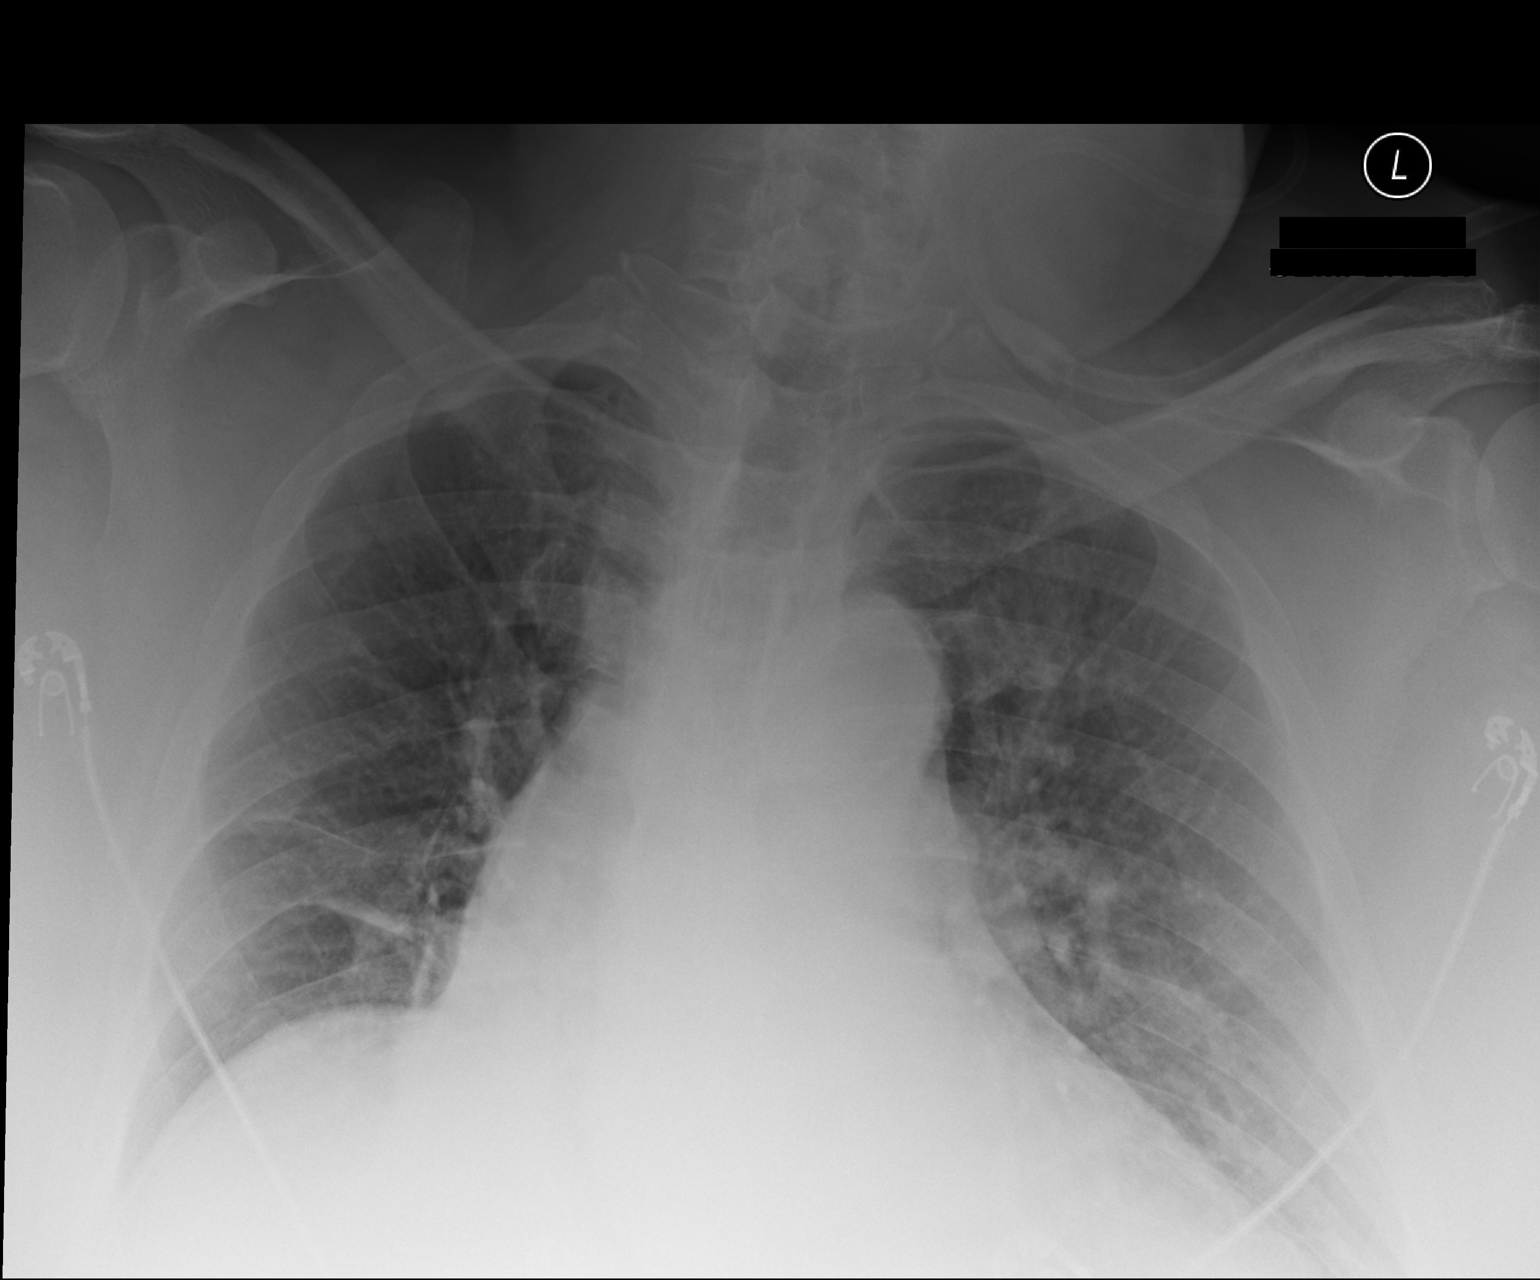

[1 of 1 positions shown; findings below may reference images not displayed]

FINDINGS: Cardiomegaly, pulmonary vascular congestion and mild bibasilar
atelectasis noted.

This is a low volume film.

There is no evidence of focal airspace disease, pulmonary edema,
suspicious pulmonary nodule/mass, pleural effusion, or pneumothorax.

No acute bony abnormalities are identified.
IMPRESSION: Cardiomegaly with pulmonary vascular congestion and bibasilar
atelectasis.

## 2017-02-08 ENCOUNTER — Encounter: Payer: Self-pay | Admitting: *Deleted

## 2017-02-08 ENCOUNTER — Emergency Department: Payer: Non-veteran care

## 2017-02-08 ENCOUNTER — Emergency Department
Admission: EM | Admit: 2017-02-08 | Discharge: 2017-02-08 | Disposition: A | Discharge status: Home / Self Care | Payer: Non-veteran care | Attending: Emergency Medicine | Admitting: Emergency Medicine

## 2017-02-08 ENCOUNTER — Other Ambulatory Visit: Payer: Self-pay

## 2017-02-08 DIAGNOSIS — E119 Type 2 diabetes mellitus without complications: Secondary | ICD-10-CM | POA: Diagnosis not present

## 2017-02-08 DIAGNOSIS — K861 Other chronic pancreatitis: Secondary | ICD-10-CM | POA: Insufficient documentation

## 2017-02-08 DIAGNOSIS — I5042 Chronic combined systolic (congestive) and diastolic (congestive) heart failure: Secondary | ICD-10-CM | POA: Insufficient documentation

## 2017-02-08 DIAGNOSIS — R1012 Left upper quadrant pain: Secondary | ICD-10-CM | POA: Diagnosis present

## 2017-02-08 DIAGNOSIS — Z794 Long term (current) use of insulin: Secondary | ICD-10-CM | POA: Diagnosis not present

## 2017-02-08 DIAGNOSIS — Z79899 Other long term (current) drug therapy: Secondary | ICD-10-CM | POA: Insufficient documentation

## 2017-02-08 DIAGNOSIS — I110 Hypertensive heart disease with heart failure: Secondary | ICD-10-CM | POA: Diagnosis not present

## 2017-02-08 DIAGNOSIS — Z7982 Long term (current) use of aspirin: Secondary | ICD-10-CM | POA: Diagnosis not present

## 2017-02-08 LAB — HEPATIC FUNCTION PANEL
ALT: 13 U/L — ABNORMAL LOW (ref 17–63)
AST: 18 U/L (ref 15–41)
Albumin: 3.8 g/dL (ref 3.5–5.0)
Alkaline Phosphatase: 69 U/L (ref 38–126)
BILIRUBIN DIRECT: 0.2 mg/dL (ref 0.1–0.5)
BILIRUBIN TOTAL: 0.7 mg/dL (ref 0.3–1.2)
Indirect Bilirubin: 0.5 mg/dL (ref 0.3–0.9)
Total Protein: 10 g/dL — ABNORMAL HIGH (ref 6.5–8.1)

## 2017-02-08 LAB — LIPASE, BLOOD: Lipase: 22 U/L (ref 11–51)

## 2017-02-08 LAB — BASIC METABOLIC PANEL
ANION GAP: 12 (ref 5–15)
BUN: 21 mg/dL — ABNORMAL HIGH (ref 6–20)
CALCIUM: 9.4 mg/dL (ref 8.9–10.3)
CO2: 24 mmol/L (ref 22–32)
CREATININE: 0.99 mg/dL (ref 0.61–1.24)
Chloride: 102 mmol/L (ref 101–111)
GFR calc Af Amer: >60 mL/min (ref >60)
GFR calc non Af Amer: >60 mL/min (ref >60)
GLUCOSE: 217 mg/dL — AB (ref 65–99)
Potassium: 4.1 mmol/L (ref 3.5–5.1)
Sodium: 138 mmol/L (ref 135–145)

## 2017-02-08 LAB — CBC WITH DIFFERENTIAL/PLATELET
BASOS ABS: 0 10*3/uL (ref 0–0.1)
Basophils Relative: 0 %
EOS ABS: 0.1 10*3/uL (ref 0–0.7)
EOS PCT: 1 %
HCT: 45.7 % (ref 40.0–52.0)
Hemoglobin: 14.6 g/dL (ref 13.0–18.0)
LYMPHS ABS: 0.6 10*3/uL — AB (ref 1.0–3.6)
Lymphocytes Relative: 6 %
MCH: 27.5 pg (ref 26.0–34.0)
MCHC: 31.9 g/dL — ABNORMAL LOW (ref 32.0–36.0)
MCV: 86.2 fL (ref 80.0–100.0)
MONO ABS: 0.5 10*3/uL (ref 0.2–1.0)
Monocytes Relative: 5 %
Neutro Abs: 7.8 10*3/uL — ABNORMAL HIGH (ref 1.4–6.5)
Neutrophils Relative %: 88 %
PLATELETS: 168 10*3/uL (ref 150–440)
RBC: 5.3 MIL/uL (ref 4.40–5.90)
RDW: 15.8 % — AB (ref 11.5–14.5)
WBC: 9 10*3/uL (ref 3.8–10.6)

## 2017-02-08 LAB — TROPONIN I: Troponin I: <0.03 ng/mL (ref <0.03)

## 2017-02-08 MED ORDER — OXYCODONE HCL 10 MG PO TABS: 10.0000 mg | ORAL_TABLET | Freq: Four times a day (QID) | ORAL | 0 refills | Status: DC | PRN

## 2017-02-08 MED ORDER — HALOPERIDOL LACTATE 5 MG/ML IJ SOLN: 2.5000 mg | Freq: Once | INTRAMUSCULAR | Status: DC

## 2017-02-08 MED ORDER — ONDANSETRON 4 MG PO TBDP: 4.0000 mg | ORAL_TABLET | Freq: Three times a day (TID) | ORAL | 0 refills | Status: DC | PRN

## 2017-02-08 MED ORDER — KETOROLAC TROMETHAMINE 30 MG/ML IJ SOLN
15.0000 mg | Freq: Once | INTRAMUSCULAR | Status: AC
  Administered 2017-02-08: 15 mg via INTRAVENOUS
  Filled 2017-02-08: qty 1

## 2017-02-08 MED ORDER — HALOPERIDOL LACTATE 5 MG/ML IJ SOLN
5.0000 mg | Freq: Once | INTRAMUSCULAR | Status: AC
  Administered 2017-02-08: 5 mg via INTRAVENOUS
  Filled 2017-02-08: qty 1

## 2017-02-08 MED ORDER — SODIUM CHLORIDE 0.9 % IV BOLUS (SEPSIS): 1000.0000 mL | Freq: Once | INTRAVENOUS | Status: DC

## 2017-02-08 MED ORDER — HYDROMORPHONE HCL 1 MG/ML IJ SOLN
2.0000 mg | Freq: Once | INTRAMUSCULAR | Status: DC
  Filled 2017-02-08: qty 2

## 2017-02-08 NOTE — ED Triage Notes (Signed)
Per EMS report, patient c/o LUQ abdominal pain after eating [place] food. Patient has a history of pancreatitis. Patient had two episodes of vomiting enroute.

## 2017-02-08 NOTE — ED Notes (Signed)
Patient would not allow EKG at this time.

## 2017-02-08 NOTE — ED Notes (Signed)
Patient taken to CT scan.

## 2017-02-08 NOTE — ED Notes (Addendum)
Per CT tech, patient refused to lie on his back for the CT scan. Patient was assisted back on the stretcher by CT techs and arrived to his room lying supine. Dr. Mable Paris informed.

## 2017-02-08 NOTE — ED Notes (Signed)
Patient is moving back and forth on the stretcher, c/o pain.Patient denies pain relief with the Haldol. Patient asked for the football game to be put on the TV and asked to be sat up on the stretcher. Patient asked for a Dilaudid injection.

## 2017-02-08 NOTE — ED Notes (Signed)
MD at the bedside to discuss plan of care. Pt wife at the bedside. No distress noted.

## 2017-02-08 NOTE — ED Notes (Signed)
Pt vomiting approx 100 ml green yellow

## 2017-02-08 NOTE — Discharge Instructions (Signed)
Please take your pain medication as needed for severe symptoms and follow-up with your primary care physician in 2 days for recheck.  Make sure you remain well hydrated and return to the emergency department for any concerns.  It was a pleasure to take care of you today, and thank you for coming to our emergency department.  If you have any questions or concerns before leaving please ask the nurse to grab me and I'm more than happy to go through your aftercare instructions again.  If you were prescribed any opioid pain medication today such as Norco, Vicodin, Percocet, morphine, hydrocodone, or oxycodone please make sure you do not drive when you are taking this medication as it can alter your ability to drive safely.  If you have any concerns once you are home that you are not improving or are in fact getting worse before you can make it to your follow-up appointment, please do not hesitate to call 911 and come back for further evaluation.  Darel Hong, MD  Results for orders placed or performed during the hospital encounter of 36/62/94  Basic metabolic panel  Result Value Ref Range   Sodium 138 135 - 145 mmol/L   Potassium 4.1 3.5 - 5.1 mmol/L   Chloride 102 101 - 111 mmol/L   CO2 24 22 - 32 mmol/L   Glucose, Bld 217 (H) 65 - 99 mg/dL   BUN 21 (H) 6 - 20 mg/dL   Creatinine, Ser 0.99 0.61 - 1.24 mg/dL   Calcium 9.4 8.9 - 10.3 mg/dL   GFR calc non Af Amer >60 >60 mL/min   GFR calc Af Amer >60 >60 mL/min   Anion gap 12 5 - 15  Hepatic function panel  Result Value Ref Range   Total Protein 10.0 (H) 6.5 - 8.1 g/dL   Albumin 3.8 3.5 - 5.0 g/dL   AST 18 15 - 41 U/L   ALT 13 (L) 17 - 63 U/L   Alkaline Phosphatase 69 38 - 126 U/L   Total Bilirubin 0.7 0.3 - 1.2 mg/dL   Bilirubin, Direct 0.2 0.1 - 0.5 mg/dL   Indirect Bilirubin 0.5 0.3 - 0.9 mg/dL  Lipase, blood  Result Value Ref Range   Lipase 22 11 - 51 U/L  Troponin I  Result Value Ref Range   Troponin I <0.03 <0.03 ng/mL  CBC  with Differential  Result Value Ref Range   WBC 9.0 3.8 - 10.6 K/uL   RBC 5.30 4.40 - 5.90 MIL/uL   Hemoglobin 14.6 13.0 - 18.0 g/dL   HCT 45.7 40.0 - 52.0 %   MCV 86.2 80.0 - 100.0 fL   MCH 27.5 26.0 - 34.0 pg   MCHC 31.9 (L) 32.0 - 36.0 g/dL   RDW 15.8 (H) 11.5 - 14.5 %   Platelets 168 150 - 440 K/uL   Neutrophils Relative % 88 %   Neutro Abs 7.8 (H) 1.4 - 6.5 K/uL   Lymphocytes Relative 6 %   Lymphs Abs 0.6 (L) 1.0 - 3.6 K/uL   Monocytes Relative 5 %   Monocytes Absolute 0.5 0.2 - 1.0 K/uL   Eosinophils Relative 1 %   Eosinophils Absolute 0.1 0 - 0.7 K/uL   Basophils Relative 0 %   Basophils Absolute 0.0 0 - 0.1 K/uL

## 2017-02-08 NOTE — ED Provider Notes (Signed)
[facility] Emergency Department Provider Note  ____________________________________________   First MD Initiated Contact with Patient 02/08/17 2006     (approximate)  I have reviewed the triage vital signs and the nursing notes.   HISTORY  Chief Complaint Abdominal Pain   HPI Don Brown is a 61 y.o. male is brought to the emergency department by EMS with severe epigastric pain radiating towards his back that began insidiously earlier today.  Is associated with nausea and vomiting and inability to keep food down.  The patient has a complex past medical history including congestive heart failure as well as chronic pancreatitis.  He normally takes oxycodone instant release 4 times a day however today he was too nauseated and could not keep the food down.  He denies chest pain.  He denies shortness of breath.  The pain seems to be worse when trying to eat and improved when not.   05/01/13 Echo: Study Conclusions  - Left ventricle: The cavity size was severely dilated. Wall thickness was increased in a pattern of mild LVH. Systolic function was severely reduced. The estimated ejection fraction was in the range of 20% to 25%. Diffuse hypokinesis.   Past Medical History:  Diagnosis Date  . Anemia   . Anxiety   . Atrial fibrillation ([place])   . Cervical compression fracture (HCC)    Cervical disk disease requiring surgery  . CHF (congestive heart failure) ([place])   . Chronic pain   . Chronic systolic heart failure (HCC)    LVEF 40-45%  . Degenerative joint disease   . Depression   . Essential hypertension, benign   . Headache    "stress"  . Heart murmur   . History of blood transfusion 2007   "related to OR"  . History of renal failure    Requiring hemodialysis in the past with normal renal function at this point  . History of ventral hernia repair   . Hyperlipidemia   . MGUS (monoclonal gammopathy of unknown significance)    "or myelona"  (05/07/2014)  . Mitral valve prolapse   . Morbid obesity ([place])   . Myeloma ([place])    "or MGUS" (05/07/2014)  . Narcotic dependence, episodic use ([place])   . On home oxygen therapy    "2L prn" (05/07/2014)  . 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 Respiratory failure ([place])    History of ventilatory-dependent respiratory failure and tracheostomy   . Sleep apnea    "don't wear mask" (05/07/2014)  . Type 2 diabetes mellitus [facility])     Patient Active Problem List   Diagnosis Date Noted  . Thrombocytopenia ([place]) 01/05/2015  . Chronic pain 05/07/2014  . Type 2 diabetes mellitus ([place]) 05/07/2014  . Bradycardia 05/07/2014  . Streptococcal infection 05/07/2014  . Abdominal pain 05/06/2014  . HTN (hypertension), benign   . Chest pain 02/05/2014  . Chronic atrial fibrillation ([place]) 02/05/2014  . Diabetes mellitus type 2, controlled ([place]) 02/05/2014  . Pancreatitis 10/03/2013  . 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 Acute pancreatitis 10/03/2013  . Acute on chronic combined systolic and diastolic CHF (congestive heart failure) ([place]) 07/07/2013  . Morbid obesity ([place]) 07/07/2013  . Chronic combined systolic and diastolic CHF (congestive heart failure) ([place]) 07/07/2013  . Hyperkalemia 05/14/2013  . AKI (acute kidney injury) ([place]) 05/13/2013  . Pancreatitis, acute 05/10/2013  . Fever 05/03/2013  . Hyperosmolar non-ketotic  state in patient with type 2 diabetes mellitus ([place]) 05/01/2013  . Lactic acidosis 05/01/2013  . Delirium, acute 05/01/2013  . Chronic pancreatitis ([place]) 05/01/2013  . Acute respiratory failure ([place]) 05/01/2013  . Recurrent incisional hernia with obstruction 12/29/2012  . Nonischemic cardiomyopathy ([place]) 07/14/2012  . Atrial fibrillation ([place]) 01/14/2011  . Acute on chronic combined systolic and diastolic heart failure 56/43/3295  . Valvular  heart disease 01/14/2011    Past Surgical History:  Procedure Laterality Date  . BONE MARROW BIOPSY  02/2014  . CATARACT EXTRACTION W/ INTRAOCULAR LENS  IMPLANT, BILATERAL Bilateral   . CHOLECYSTECTOMY    . HERNIA REPAIR    . INSERTION OF MESH  10/2006  . PANCREAS SURGERY     "placed stents; multiple pseudo cysts removal"  . SKIN GRAFT    . TRACHEOSTOMY  2007  . VENTRAL HERNIA REPAIR      Prior to Admission medications   Medication Sig Start Date End Date Taking? Authorizing Provider  APPLE CIDER VINEGAR PO Take 1 tablet by mouth 3 (three) times daily.    [provider]  aspirin 81 MG tablet Take 81 mg by mouth daily before breakfast.     [provider]  Cyanocobalamin (VITAMIN B 12 PO) Take 1 tablet by mouth daily.     [provider]  digoxin (LANOXIN) 0.125 MG tablet Take 1 tablet (125 mcg total) by mouth every morning. 02/12/14   Eugenie Filler, MD  folic acid (FOLVITE) 1 MG tablet Take 1 tablet (1 mg total) by mouth daily. 05/17/13   Thurnell Lose, MD  furosemide (LASIX) 40 MG tablet Take 1 tablet (40 mg total) by mouth 2 (two) times daily. Hold for the next 3 days, then resume on 01/12/2015 Patient taking differently: Take 40 mg by mouth daily. Hold for the next 3 days, then resume on 01/12/2015 01/12/15   Elgergawy, Silver Huguenin, MD  gabapentin (NEURONTIN) 600 MG tablet Take 600 mg by mouth 3 (three) times daily.    [provider]  HYDROcodone-acetaminophen (NORCO/VICODIN) 5-325 MG tablet Take 1 tablet by mouth every 6 (six) hours as needed for moderate pain. 01/22/16   Duffy Bruce, MD  HYDROmorphone (DILAUDID) 2 MG tablet Take 2 mg by mouth every 4 (four) hours as needed for severe pain.    [provider]  insulin aspart (NOVOLOG) 100 UNIT/ML injection Before each meal 3 times a day and then bedtime, 140-199 - 2 units, 200-250 - 4 units, 251-299 - 6 units,  300-349 - 8 units,  350 or above 10 units. Dispense syringes and  needles as needed, Ok to switch to PEN if approved. Okay to switch to any approved short-acting insulin. 05/17/13   Thurnell Lose, MD  insulin glargine (LANTUS) 100 UNIT/ML injection Inject 20-40 Units into the skin at bedtime. Takes 20 - 40 units depending on blood sugar reading. Confirmed with pt.    [provider]  metFORMIN (GLUCOPHAGE) 500 MG tablet Take 500 mg by mouth 2 (two) times daily with a meal.    [provider]  metoprolol succinate (TOPROL-XL) 50 MG 24 hr tablet Take 1 tablet (50 mg total) by mouth every morning. 02/12/14   Eugenie Filler, MD  Misc Natural Products [facility] COMPLEX PO) Take 1 tablet by mouth daily.    [provider]  Omega-3 1000 MG CAPS Take 1 capsule by mouth daily.    [provider]  ondansetron (ZOFRAN ODT) 4 MG disintegrating tablet Take 1  tablet (4 mg total) every 8 (eight) hours as needed by mouth for nausea or vomiting. 02/08/17   Darel Hong, MD  ondansetron (ZOFRAN) 4 MG tablet Take 1 tablet (4 mg total) by mouth every 8 (eight) hours as needed for nausea or vomiting. 07/26/14   Dahlia Bailiff, PA-C  ondansetron (ZOFRAN) 4 MG tablet Take 1 tablet (4 mg total) by mouth every 8 (eight) hours as needed for nausea or vomiting. 01/22/16   Duffy Bruce, MD  Oxycodone HCl 10 MG TABS Take 1 tablet (10 mg total) every 6 (six) hours as needed by mouth. 02/08/17   Darel Hong, MD  Pancrelipase, Lip-Prot-Amyl, 24000-76000 units CPEP Take 24,000 Units by mouth 3 (three) times daily.    [provider]  pantoprazole (PROTONIX) 40 MG tablet Take 1 tablet (40 mg total) by mouth 2 (two) times daily. 05/17/13   Thurnell Lose, MD  potassium chloride SA (K-DUR,KLOR-CON) 20 MEQ tablet Take 2 tablets (40 mEq total) by mouth daily. Patient taking differently: Take 20 mEq by mouth daily.  02/12/14   Eugenie Filler, MD  sildenafil (VIAGRA) 100 MG tablet Take 100 mg by mouth daily as needed for erectile dysfunction.     [provider]  simvastatin (ZOCOR) 80 MG tablet Take 80 mg by mouth daily.    [provider]  thiamine 100 MG tablet Take 1 tablet (100 mg total) by mouth daily. 05/17/13   Thurnell Lose, MD  TURMERIC PO Take 1 tablet by mouth daily.    [provider]    Allergies Morphine and related  Family History  Problem Relation Age of Onset  . Lymphoma Father   . Hypertension Mother   . Diabetes Mother     Social History Social History   Tobacco Use  . Smoking status: Never Smoker  . Smokeless tobacco: Never Used  Substance Use Topics  . Alcohol use: No  . Drug use: No    Review of Systems Constitutional: No fever/chills Eyes: No visual changes. ENT: No sore throat. Cardiovascular: Denies chest pain. Respiratory: Denies shortness of breath. Gastrointestinal: Positive for abdominal pain.  Positive for nausea, positive for vomiting.  No diarrhea.  No constipation. Genitourinary: Negative for dysuria. Musculoskeletal: Negative for back pain. Skin: Negative for rash. Neurological: Negative for headaches, focal weakness or numbness.   ____________________________________________   PHYSICAL EXAM:  VITAL SIGNS: ED Triage Vitals  Enc Vitals Group     BP      Pulse      Resp      Temp      Temp src      SpO2      Weight      Height      Head Circumference      Peak Flow      Pain Score      Pain Loc      Pain Edu?      Excl. in [place]?     Constitutional: Lying on his side retching and vomiting appears uncomfortable Eyes: PERRL EOMI. Head: Atraumatic. Nose: No congestion/rhinnorhea. Mouth/Throat: No trismus Neck: No stridor.   Cardiovascular: Normal rate, regular rhythm. Grossly normal heart sounds.  Good peripheral circulation. Respiratory: Normal respiratory effort.  No retractions. Lungs CTAB and moving good air Gastrointestinal: Morbidly obese soft diffuse mild tenderness with no focality no rebound or guarding no  peritonitis Musculoskeletal: No lower extremity edema   Neurologic:  Normal speech and language. No gross focal neurologic deficits are appreciated.  Skin:  Skin is warm, dry and intact. No rash noted. Psychiatric: Appears uncomfortable    ____________________________________________   DIFFERENTIAL includes but not limited to  Chronic pancreatitis, acute pancreatitis, acute coronary syndrome, dehydration, metabolic derangement ____________________________________________   LABS (all labs ordered are listed, but only abnormal results are displayed)  Labs Reviewed  BASIC METABOLIC PANEL - Abnormal; Notable for the following components:      Result Value   Glucose, Bld 217 (*)    BUN 21 (*)    All other components within normal limits  HEPATIC FUNCTION PANEL - Abnormal; Notable for the following components:   Total Protein 10.0 (*)    ALT 13 (*)    All other components within normal limits  CBC WITH DIFFERENTIAL/PLATELET - Abnormal; Notable for the following components:   MCHC 31.9 (*)    RDW 15.8 (*)    Neutro Abs 7.8 (*)    Lymphs Abs 0.6 (*)    All other components within normal limits  LIPASE, BLOOD  TROPONIN I    Lab work reviewed by me shows no signs of acute ischemia __________________________________________  EKG  ED ECG REPORT I, Darel Hong, the attending physician, personally viewed and interpreted this ECG.  Date: 02/08/2017 EKG Time:  Rate: 97 Rhythm: Atrial fibrillation QRS Axis: normal Intervals: normal ST/T Wave abnormalities: Nonspecific ST changes Narrative Interpretation: no evidence of acute ischemia  ____________________________________________  RADIOLOGY  Patient declined CT scan ____________________________________________   PROCEDURES  Procedure(s) performed: no  Procedures  Critical Care performed: no  Observation: no ____________________________________________   INITIAL IMPRESSION / ASSESSMENT AND PLAN / ED  COURSE  Pertinent labs & imaging results that were available during my care of the patient were reviewed by me and considered in my medical decision making (see chart for details).  The patient arrives retching and vomiting with upper abdominal pain that he states feels identical to previous episodes of pancreatitis.  Haloperidol given for pain and nausea.  Fluids deferred at this time given the patient's poor ejection fraction.  Reevaluation pending.  Patient feels significantly improved after haloperidol and is resting comfortably.  When I come into the room however he is asking for a "IV push of Dilaudid and some Benadryl afterwards".  He had a lengthy discussion with the patient regarding his reported morphine allergy and that Dilaudid would likely cause similar symptoms.  I did decide to give Dilaudid and 100 cc bag, however by the time the nurse came to see the patient he was sleeping.  I discussed CT scan with the patient and he initially agreed, but however once he got over the CT scan he declined which I think is reasonable.  He subsequently was resting comfortably.  I had a lengthy discussion with the patient and his wife at bedside regarding his diagnosis and I will give him 3 days worth of oxycodone as well as Zofran to help him get through the acute setting.  At this point as he is able to keep food down he does not require inpatient admission.  Strict return precautions have been given and the patient verbalized understanding and agreement with the plan.      ____________________________________________   FINAL CLINICAL IMPRESSION(S) / ED DIAGNOSES  Final diagnoses:  Chronic pancreatitis, unspecified pancreatitis type ([place])      NEW MEDICATIONS STARTED DURING THIS VISIT:  This SmartLink is deprecated. Use AVSMEDLIST instead to display the medication list for a patient.   Note:  This document was prepared using Dragon  voice recognition software and may include unintentional  dictation errors.     Darel Hong, MD 02/09/17 [phone]

## 2017-07-19 ENCOUNTER — Other Ambulatory Visit (HOSPITAL_COMMUNITY): Payer: Self-pay | Admitting: Urology

## 2017-07-19 DIAGNOSIS — N2889 Other specified disorders of kidney and ureter: Secondary | ICD-10-CM

## 2017-08-12 ENCOUNTER — Other Ambulatory Visit: Payer: Self-pay | Admitting: Urology

## 2017-08-12 ENCOUNTER — Ambulatory Visit
Admission: RE | Admit: 2017-08-12 | Discharge: 2017-08-12 | Disposition: A | Discharge status: Home / Self Care | Payer: Self-pay | Source: Ambulatory Visit | Attending: Urology | Admitting: Urology

## 2017-08-12 DIAGNOSIS — R52 Pain, unspecified: Secondary | ICD-10-CM

## 2017-08-17 ENCOUNTER — Other Ambulatory Visit: Payer: Self-pay | Admitting: Urology

## 2017-08-17 DIAGNOSIS — N2889 Other specified disorders of kidney and ureter: Secondary | ICD-10-CM

## 2017-08-18 ENCOUNTER — Encounter: Payer: Self-pay | Admitting: *Deleted

## 2017-08-18 ENCOUNTER — Ambulatory Visit
Admission: RE | Admit: 2017-08-18 | Discharge: 2017-08-18 | Disposition: A | Discharge status: Home / Self Care | Payer: Non-veteran care | Source: Ambulatory Visit | Attending: Urology | Admitting: Urology

## 2017-08-18 DIAGNOSIS — N2889 Other specified disorders of kidney and ureter: Secondary | ICD-10-CM

## 2017-08-18 HISTORY — PX: IR RADIOLOGIST EVAL & MGMT: IMG5224

## 2017-08-18 NOTE — Consult Note (Signed)
Chief Complaint: Indeterminate renal lesion  Referring Physician(s): [facility]: Dr. Devonne Doughty [facility] PCP: Dr. Rondell Reams [facility] Endocrinologist: He did not have the name of his doctor [facility] Cardiologist: He did not have the name of his doctor [place] [facility]ist: He did not have the name of his doctor  History of Present Illness: Don Brown is a 62 y.o. male presenting today as a scheduled appointment to [facility], kindly referred by his doctor, Dr. Laurance Flatten, for evaluation of a left renal lesion that had enlarged on CT imaging.   [name] was here today with a family member for the interview.    Getting a complete and accurate history I found to be difficult, as the patient believed that everything I needed to know was "in my records."    I was able elicit a history of no significant urinary symptoms, as he denies any hematuria.  I could not get the history of why the CT scans were ordered. I could not get any information of any family history of RCC.   I have CT images to see that are dated February of 2018 and 2019.  Neither has IV contrast.  He has indeterminate renal lesions, including a hyperdense cystic lesion on the left that may have enlarged from 8-96m to 10-11 mm over 1 year.  The differential includes both benign and malignant lesions, however, as I told him, the most likely entity would be a benign hemorrhagic cyst.    He made it very clear that he has concerns for IV contrast, as he takes metformin for DM.  I did explain that IV contrast is a safe component of CT imaging if we hold the metformin dose for 48 hours.  The majority of our conversation regarding the next steps for his work-up centered on the IV contrast, as he was perseverating on the metformin/IV contrast interaction.  Surgical hx:  He did not give me complete surgical hx when asked, but did say he had a failed ventral hernia repair with mesh, and cholecystectomy.   He denies any MI. He states he has had a prior stroke.     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   Soc: Not married.  4 living children.  Non smoker.  No ETOH usage.       Past Medical History:  Diagnosis Date  . Anemia   . Anxiety   . [place]rial fibrillation [place]   . Cervical compression fracture (HCC)    Cervical disk disease requiring surgery  . CHF (congestive heart failure) ([facility]   . Chronic pain   . Chronic systolic heart failure (HCC)    LVEF 40-45%  . Degenerative joint disease   . Depression   . Essential hypertension, benign   . Headache    "stress"  . Heart murmur   . History of blood transfusion 2007   "related to OR"  . History of renal failure    Requiring hemodialysis in the past with normal renal function at this point  . History of ventral hernia repair   . Hyperlipidemia   . MGUS (monoclonal gammopathy of unknown significance)    "or myelona" (05/07/2014)  . Mitral valve prolapse   . Morbid obesity ([facility]   . Myeloma ([facility]    "or MGUS" (05/07/2014)  . Narcotic dependence, episodic use ([facility]   . On home oxygen therapy    "2L prn" (05/07/2014)  .  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 Respiratory failure ([place])    History of ventilatory-dependent respiratory failure and tracheostomy   . Sleep apnea    "don't wear mask" (05/07/2014)  . Type 2 diabetes mellitus ([place])     Past Surgical History:  Procedure Laterality Date  . BONE MARROW BIOPSY  02/2014  . CATARACT EXTRACTION W/ INTRAOCULAR LENS  IMPLANT, BILATERAL Bilateral   . CHOLECYSTECTOMY    . HERNIA REPAIR    . INSERTION OF MESH  10/2006  . PANCREAS SURGERY     "placed stents; multiple pseudo cysts removal"  . SKIN GRAFT    . TRACHEOSTOMY  2007  . VENTRAL HERNIA REPAIR      Allergies: Morphine and related  Medications: Prior to  Admission medications   Medication Sig Start Date End Date Taking? Authorizing Provider  APPLE CIDER VINEGAR PO Take 1 tablet by mouth 3 (three) times daily.   Yes [provider]  aspirin 81 MG tablet Take 81 mg by mouth daily before breakfast.    Yes [provider]  benazepril (LOTENSIN) 10 MG tablet Take 10 mg by mouth daily. Take 1/2 tablet daily by mouth   Yes [provider]  Carboxymethylcellulose Sod PF 0.5 % SOLN Apply 1 drop to eye 5 (five) times daily. 1 drop in each eye 5 times/day   Yes [provider]  carvedilol (COREG) 25 MG tablet Take 25 mg by mouth 2 (two) times daily with a meal. Takes 1/2 tablet by mouth bid.   Yes [provider]  Cholecalciferol 2000 units TABS Take 1 tablet by mouth daily.   Yes [provider]  Cyanocobalamin (VITAMIN B 12 PO) Take 1 tablet by mouth daily.    Yes [provider]  digoxin (LANOXIN) 0.125 MG tablet Take 1 tablet (125 mcg total) by mouth every morning. 02/12/14  Yes Eugenie Filler, MD  escitalopram (LEXAPRO) 10 MG tablet Take 10 mg by mouth daily.   Yes [provider]  furosemide (LASIX) 40 MG tablet Take 1 tablet (40 mg total) by mouth 2 (two) times daily. Hold for the next 3 days, then resume on 01/12/2015 Patient taking differently: Take 40 mg by mouth daily. Hold for the next 3 days, then resume on 01/12/2015 01/12/15  Yes Elgergawy, Silver Huguenin, MD  gabapentin (NEURONTIN) 600 MG tablet Take 600 mg by mouth 3 (three) times daily.   Yes [provider]  insulin aspart (NOVOLOG) 100 UNIT/ML injection Before each meal 3 times a day and then bedtime, 140-199 - 2 units, 200-250 - 4 units, 251-299 - 6 units,  300-349 - 8 units,  350 or above 10 units. Dispense syringes and needles as needed, Ok to switch to PEN if approved. Okay to switch to any approved short-acting insulin. 05/17/13  Yes Thurnell Lose, MD  insulin glargine (LANTUS) 100 UNIT/ML injection Inject  20-40 Units into the skin at bedtime. Takes 20 - 40 units depending on blood sugar reading. Confirmed with pt.   Yes [provider]  metFORMIN (GLUCOPHAGE) 500 MG tablet Take 500 mg by mouth 2 (two) times daily with a meal.   Yes [provider]  OXcarbazepine (TRILEPTAL) 150 MG tablet Take 150 mg by mouth 2 (two) times daily. Take 2 tablets by mouth 3 times daily for headaches.   Yes [provider]  Oxycodone HCl  10 MG TABS Take 1 tablet (10 mg total) every 6 (six) hours as needed by mouth. 02/08/17  Yes Rifenbark, Milta Deiters, MD  Pancrelipase, Lip-Prot-Amyl, 24000-76000 units CPEP Take 24,000 Units by mouth 3 (three) times daily.   Yes [provider]  potassium chloride SA (K-DUR,KLOR-CON) 20 MEQ tablet Take 2 tablets (40 mEq total) by mouth daily. Patient taking differently: Take 20 mEq by mouth daily.  02/12/14  Yes Eugenie Filler, MD  sildenafil (VIAGRA) 100 MG tablet Take 100 mg by mouth daily as needed for erectile dysfunction.   Yes [provider]  triamcinolone cream (KENALOG) 0.1 % Apply 1 application topically 2 (two) times daily. Apply as directed to affected area bid.   Yes [provider]  folic acid (FOLVITE) 1 MG tablet Take 1 tablet (1 mg total) by mouth daily. Patient not taking: Reported on 08/18/2017 05/17/13   Thurnell Lose, MD  HYDROcodone-acetaminophen (NORCO/VICODIN) 5-325 MG tablet Take 1 tablet by mouth every 6 (six) hours as needed for moderate pain. Patient not taking: Reported on 08/18/2017 01/22/16   Duffy Bruce, MD  HYDROmorphone (DILAUDID) 2 MG tablet Take 2 mg by mouth every 4 (four) hours as needed for severe pain.    [provider]  metoprolol succinate (TOPROL-XL) 50 MG 24 hr tablet Take 1 tablet (50 mg total) by mouth every morning. Patient not taking: Reported on 08/18/2017 02/12/14   Eugenie Filler, MD  Misc Natural Products [facility] COMPLEX PO) Take 1 tablet by mouth daily.    [provider]  Omega-3 1000 MG CAPS Take 1 capsule by mouth daily.    [provider]  ondansetron (ZOFRAN ODT) 4 MG disintegrating tablet Take 1 tablet (4 mg total) every 8 (eight) hours as needed by mouth for nausea or vomiting. Patient not taking: Reported on 08/18/2017 02/08/17   Darel Hong, MD  ondansetron (ZOFRAN) 4 MG tablet Take 1 tablet (4 mg total) by mouth every 8 (eight) hours as needed for nausea or vomiting. Patient not taking: Reported on 08/18/2017 07/26/14   Dahlia Bailiff, PA-C  ondansetron (ZOFRAN) 4 MG tablet Take 1 tablet (4 mg total) by mouth every 8 (eight) hours as needed for nausea or vomiting. Patient not taking: Reported on 08/18/2017 01/22/16   Duffy Bruce, MD  pantoprazole (PROTONIX) 40 MG tablet Take 1 tablet (40 mg total) by mouth 2 (two) times daily. Patient not taking: Reported on 08/18/2017 05/17/13   Thurnell Lose, MD  simvastatin (ZOCOR) 80 MG tablet Take 80 mg by mouth daily.    [provider]  thiamine 100 MG tablet Take 1 tablet (100 mg total) by mouth daily. Patient not taking: Reported on 08/18/2017 05/17/13   Thurnell Lose, MD  TURMERIC PO Take 1 tablet by mouth daily.    [provider]     Family History  Problem Relation Age of Onset  . Lymphoma Father   . Hypertension Mother   . Diabetes Mother     Social History   Socioeconomic History  . Marital status: Single    Spouse name: Not on file  . Number of children: Not on file  . Years of education: Not on file  . Highest education level: Not on file  Occupational History  . Not on file  Social Needs  . Financial resource strain: Not on file  . Food insecurity:    Worry: Not on file    Inability: Not on file  . Transportation needs:  Medical: Not on file    Non-medical: Not on file  Tobacco Use  . Smoking status: Never Smoker  . Smokeless tobacco: Never Used  Substance and Sexual Activity  . Alcohol use: No  . Drug use: No  . Sexual activity:  Not Currently  Lifestyle  . Physical activity:    Days per week: Not on file    Minutes per session: Not on file  . Stress: Not on file  Relationships  . Social connections:    Talks on phone: Not on file    Gets together: Not on file    Attends religious service: Not on file    Active member of club or organization: Not on file    Attends meetings of clubs or organizations: Not on file    Relationship status: Not on file  Other Topics Concern  . Not on file  Social History Narrative  . Not on file     Review of Systems: A 12 point ROS discussed and pertinent positives are indicated in the HPI above.  All other systems are negative.  Review of Systems  Vital Signs: BP 105/78   Pulse 73   Temp 98.1 F (36.7 C) (Oral)   Resp 20   Ht '5\' 11"'  (1.803 m)   Wt (!) 325 lb (147.4 kg)   SpO2 96% Comment: O2 at 2L/nasal cannula  BMI 45.33 kg/m   Physical Exam  Patient left the interview before a PE could be performed.   Mallampati Score:     Imaging: Ct Outside Films Body  Result Date: 08/12/2017 This examination belongs to an outside facility and is stored here for comparison purposes only.  Contact the originating outside institution for any associated report or interpretation.   Labs:  CBC: Recent Labs    02/08/17 2021  WBC 9.0  HGB 14.6  HCT 45.7  PLT 168    COAGS: No results for input(s): INR, APTT in the last 8760 hours.  BMP: Recent Labs    02/08/17 2021  NA 138  K 4.1  CL 102  CO2 24  GLUCOSE 217*  BUN 21*  CALCIUM 9.4  CREATININE 0.99  GFRNONAA >60  GFRAA >60    LIVER FUNCTION TESTS: Recent Labs    02/08/17 2021  BILITOT 0.7  AST 18  ALT 13*  ALKPHOS 69  PROT 10.0*  ALBUMIN 3.8    TUMOR MARKERS: No results for input(s): AFPTM, CEA, CA199, CHROMGRNA in the last 8760 hours.  Assessment and Plan:  Mr Lamartina is a 62 year old gentleman who has sequential non-contrast CT scans from 04/2016 to 04/2017 showing a left renal lesion  with questionable small growth.    My impression, which I shared with him, is that the lesion is indeterminate, and the most likely entity, statistically, would be a benign hemorrhagic cyst not requiring any treatment.  I did emphasize that there is no Urology surgeon or Vascular & Interventional Radiology doctor that would ever treat him surgically without confirming the presence of a tumor, which has not yet been confirmed.   I also shared my impression that in order to know what the lesion is, and thus be able to assemble a treatment plan, further imaging with contrast would need to be performed.  This would include either MRI with contrast or CT with contrast.  After this imaging, we would know if the lesion would be safely categorized as benign, or if further surveillance would be necessary.    Before he  left, I did attempt to clarify with him that even if this were a tumor, the size is such that up to 20-30% are benign, and that current Urology literature has shown the risk of metastasizing in a lesion of this size is <2%. I attempted to reassure him that active surveillance (if a tumor was confirmed) would likely be his strategy.  I am not sure if he had a full understanding of these concepts.    During my discussion regarding my impression of his imaging, need for a contrast study, and my insisting that he discuss specific medication profiles with his medical doctor, he more agitated and argumentative.  He insisted that we have CT scans and do not need more imaging, despite my explanation about benign vs malignant on non-contrast imaging.  He spent the majority of my attempt of discussion contrast-enhanced CT perseverating on the metformin/contrast reaction.  He asked multiple times about the "risks" of metformin, and was clearly not satisfied with my explanation about the possibility of lactic acidosis.    Finally, he was agitated and frustrated enough that he left the appointment with his family  member before we were able to establish a reasonable plan.   His behavior was a bit erratic, and as he left he was shouting down the hall that I was the same "disrespectful" Emergency Medicine Doctor that "gave him the run around" in the ED.  This is clearly not the case.  While I was not able to administer a mini-mental status exam before his departure, I do believe he was well enough to leave our office safely with his family.   Hopefully the information I have included will be useful for any further needs for managing the indeterminate left renal lesion.    We will not see Mr Boley in our office again.    Electronically Signed: Corrie Mckusick 08/18/2017, 11:22 AM   I spent a total of  40 Minutes   in face to face in clinical consultation, greater than 50% of which was counseling/coordinating care for indeterminate left renal lesion.

## 2017-08-19 ENCOUNTER — Telehealth: Payer: Self-pay | Admitting: Radiology

## 2017-08-19 NOTE — Telephone Encounter (Signed)
Patient called stating that he is ready to schedule a CT.    *See Dr Pasty Arch note dated 08/18/2017.  This note has been faxed to the [facility], [place].  I explained to him that our office is not able to order/schedule as he did not allow Dr Earleen Newport to obtain a complete history and physical.  The patient became argumentative and agitated stating that we had all his information, and everything needed to treat him.  He was told to follow up with his referring physician at the [place].  Shandon Burlingame Riki Rusk, RN 08/19/2017 3:19 PM

## 2018-03-21 ENCOUNTER — Ambulatory Visit
Admission: RE | Admit: 2018-03-21 | Discharge: 2018-03-21 | Disposition: A | Discharge status: Home / Self Care | Payer: Self-pay | Source: Ambulatory Visit | Attending: Cardiothoracic Surgery | Admitting: Cardiothoracic Surgery

## 2018-03-21 ENCOUNTER — Other Ambulatory Visit: Payer: Self-pay | Admitting: Cardiothoracic Surgery

## 2018-03-21 DIAGNOSIS — R918 Other nonspecific abnormal finding of lung field: Secondary | ICD-10-CM

## 2018-03-21 DIAGNOSIS — J984 Other disorders of lung: Secondary | ICD-10-CM

## 2018-03-22 ENCOUNTER — Other Ambulatory Visit: Payer: Self-pay | Admitting: Cardiothoracic Surgery

## 2018-03-22 ENCOUNTER — Ambulatory Visit
Admission: RE | Admit: 2018-03-22 | Discharge: 2018-03-22 | Disposition: A | Discharge status: Home / Self Care | Payer: Self-pay | Source: Ambulatory Visit | Attending: Cardiothoracic Surgery | Admitting: Cardiothoracic Surgery

## 2018-03-22 DIAGNOSIS — N2889 Other specified disorders of kidney and ureter: Secondary | ICD-10-CM

## 2018-03-31 ENCOUNTER — Ambulatory Visit (INDEPENDENT_AMBULATORY_CARE_PROVIDER_SITE_OTHER): Payer: No Typology Code available for payment source | Admitting: Surgery

## 2018-03-31 ENCOUNTER — Encounter: Payer: Self-pay | Admitting: *Deleted

## 2018-03-31 ENCOUNTER — Encounter: Payer: Self-pay | Admitting: Surgery

## 2018-03-31 ENCOUNTER — Other Ambulatory Visit: Payer: Self-pay

## 2018-03-31 VITALS — BP 154/102 | HR 101 | Temp 96.6°F | Resp 22 | Ht 72.0 in | Wt 325.0 lb

## 2018-03-31 DIAGNOSIS — K432 Incisional hernia without obstruction or gangrene: Secondary | ICD-10-CM | POA: Diagnosis not present

## 2018-03-31 DIAGNOSIS — K8689 Other specified diseases of pancreas: Secondary | ICD-10-CM

## 2018-03-31 NOTE — Progress Notes (Signed)
Patient needs a university referral for recurrent incisional ventral hernia per Dr. Rosana Hoes.   Since patient needs approval from the [place] for referrals, we will have Angie from our office obtain authorization and make referral once this has been done.   Patient aware of the above and verbalizes understanding.

## 2018-03-31 NOTE — Progress Notes (Signed)
Surgical Clinic History and Physical  Referring provider:  [facility] No referring provider defined for this encounter.   HISTORY OF PRESENT ILLNESS (HPI):  63 y.o. male presents for evaluation of his chronic recurrent Right-sided abdominal wall hernia. Upon presentation today, patient expresses he is most concerned about discontinuation of his home oxygen by his pulmonologist, stating he experiences shortness of breath even when he is at rest and worries that he may suffocate if he feels short of breath and does not have supplemental oxygen, which he has brought with him to his appointment today, available to him at all times. He also says he feels his obesity impairs his breathing. Regarding his hernia, he in 2007 underwent what sounds like pancreatic necrosectomy along with cholecystectomy at [facility]) with a prolonged ICU course, 1-2 years after which he had a Right-sided abdominal wall/flank hernia repaired with mesh at [facility]. Shortly thereafter, his hernia recurred following a fall from standing. He says his hernia causes him some discomfort, no related to eating, activity, or bowel function. 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 Shortly thereafter, he says a [place] physician removed his pancreatic stents due to concern for infection. He has ESRD on HD and recently developed a Left renal mass, for which IR evaluation and likely biopsy were requested. Patient, however, left during the middle of his appointment due to frustration regarding the suggestion of further contrast imaging needed to help determine his diagnosis. He also reports a history of multiple myeloma, which he  says is being monitored for now without treatment, and he says a more recently recurrent epigastric (likely pancreato-cutaneous) fistula drains "a little" if he lays on his side or presses it, prompting him to be concerned regarding infection and prolonged healing again.  PAST MEDICAL HISTORY (PMH):  Past Medical History:  Diagnosis Date  . Anemia   . Anxiety   . Atrial fibrillation ([place])   . Cervical compression fracture (HCC)    Cervical disk disease requiring surgery  . CHF (congestive heart failure) ([place])   . Chronic pain   . Chronic systolic heart failure (HCC)    LVEF 40-45%  . Degenerative joint disease   . Depression   . Essential hypertension, benign   . Headache    "stress"  . Heart murmur   . History of blood transfusion 2007   "related to OR"  . History of renal failure    Requiring hemodialysis in the past with normal renal function at this point  . History of ventral hernia repair   . Hyperlipidemia   . MGUS (monoclonal gammopathy of unknown significance)    "or myelona" (05/07/2014)  . Mitral valve prolapse   . Morbid obesity ([place])   . Myeloma ([place])    "or MGUS" (05/07/2014)  . Narcotic dependence, episodic use ([place])   . On home oxygen therapy    "2L prn" (05/07/2014)  . 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 Respiratory failure ([place])    History of ventilatory-dependent respiratory failure and tracheostomy   . Sleep apnea    "don't wear mask" (05/07/2014)  . Type 2 diabetes mellitus (  [place])     PAST SURGICAL HISTORY ([place]):  Past Surgical History:  Procedure Laterality Date  . BONE MARROW BIOPSY  02/2014  . CATARACT EXTRACTION W/ INTRAOCULAR LENS  IMPLANT, BILATERAL Bilateral   . CHOLECYSTECTOMY    . HERNIA REPAIR    . INSERTION OF MESH  10/2006  . IR RADIOLOGIST EVAL & MGMT  08/18/2017  . PANCREAS SURGERY     "placed  stents; multiple pseudo cysts removal"  . SKIN GRAFT    . TRACHEOSTOMY  2007  . VENTRAL HERNIA REPAIR      MEDICATIONS:  Prior to Admission medications   Medication Sig Start Date End Date Taking? Authorizing Provider  APPLE CIDER VINEGAR PO Take 1 tablet by mouth 3 (three) times daily.   Yes [provider]  aspirin 81 MG tablet Take 81 mg by mouth daily before breakfast.    Yes [provider]  benazepril (LOTENSIN) 10 MG tablet Take 10 mg by mouth daily. Take 1/2 tablet daily by mouth   Yes [provider]  Carboxymethylcellulose Sod PF 0.5 % SOLN Apply 1 drop to eye 5 (five) times daily. 1 drop in each eye 5 times/day   Yes [provider]  carvedilol (COREG) 25 MG tablet Take 25 mg by mouth 2 (two) times daily with a meal. Takes 1/2 tablet by mouth bid.   Yes [provider]  Cholecalciferol 2000 units TABS Take 1 tablet by mouth daily.   Yes [provider]  Cyanocobalamin (VITAMIN B 12 PO) Take 1 tablet by mouth daily.    Yes [provider]  digoxin (LANOXIN) 0.125 MG tablet Take 1 tablet (125 mcg total) by mouth every morning. 02/12/14  Yes Eugenie Filler, MD  escitalopram (LEXAPRO) 10 MG tablet Take 10 mg by mouth daily.   Yes [provider]  folic acid (FOLVITE) 1 MG tablet Take 1 tablet (1 mg total) by mouth daily. 05/17/13  Yes Thurnell Lose, MD  furosemide (LASIX) 40 MG tablet Take 1 tablet (40 mg total) by mouth 2 (two) times daily. Hold for the next 3 days, then resume on 01/12/2015 Patient taking differently: Take 40 mg by mouth daily. Hold for the next 3 days, then resume on 01/12/2015 01/12/15  Yes Elgergawy, Silver Huguenin, MD  gabapentin (NEURONTIN) 600 MG tablet Take 600 mg by mouth 3 (three) times daily.   Yes [provider]  HYDROcodone-acetaminophen (NORCO/VICODIN) 5-325 MG tablet Take 1 tablet by mouth every 6 (six) hours as needed for moderate pain. 01/22/16  Yes Duffy Bruce, MD   HYDROmorphone (DILAUDID) 2 MG tablet Take 2 mg by mouth every 4 (four) hours as needed for severe pain.   Yes [provider]  insulin aspart (NOVOLOG) 100 UNIT/ML injection Before each meal 3 times a day and then bedtime, 140-199 - 2 units, 200-250 - 4 units, 251-299 - 6 units,  300-349 - 8 units,  350 or above 10 units. Dispense syringes and needles as needed, Ok to switch to PEN if approved. Okay to switch to any approved short-acting insulin. 05/17/13  Yes Thurnell Lose, MD  insulin glargine (LANTUS) 100 UNIT/ML injection Inject 20-40 Units into the skin at bedtime. Takes 20 - 40 units depending on blood sugar reading. Confirmed with pt.   Yes [provider]  metFORMIN (GLUCOPHAGE) 500 MG tablet Take 500 mg by mouth 2 (two) times daily with a meal.   Yes [provider]  metoprolol succinate (TOPROL-XL) 50 MG 24  hr tablet Take 1 tablet (50 mg total) by mouth every morning. 02/12/14  Yes Eugenie Filler, MD  Misc Natural Products [facility] COMPLEX PO) Take 1 tablet by mouth daily.   Yes [provider]  Omega-3 1000 MG CAPS Take 1 capsule by mouth daily.   Yes [provider]  ondansetron (ZOFRAN ODT) 4 MG disintegrating tablet Take 1 tablet (4 mg total) every 8 (eight) hours as needed by mouth for nausea or vomiting. 02/08/17  Yes Darel Hong, MD  ondansetron (ZOFRAN) 4 MG tablet Take 1 tablet (4 mg total) by mouth every 8 (eight) hours as needed for nausea or vomiting. 07/26/14  Yes Dahlia Bailiff, PA-C  ondansetron (ZOFRAN) 4 MG tablet Take 1 tablet (4 mg total) by mouth every 8 (eight) hours as needed for nausea or vomiting. 01/22/16  Yes Duffy Bruce, MD  OXcarbazepine (TRILEPTAL) 150 MG tablet Take 150 mg by mouth 2 (two) times daily. Take 2 tablets by mouth 3 times daily for headaches.   Yes [provider]  Oxycodone HCl 10 MG TABS Take 1 tablet (10 mg total) every 6 (six) hours as needed by mouth. 02/08/17  Yes Rifenbark, Milta Deiters, MD   Pancrelipase, Lip-Prot-Amyl, 24000-76000 units CPEP Take 24,000 Units by mouth 3 (three) times daily.   Yes [provider]  pantoprazole (PROTONIX) 40 MG tablet Take 1 tablet (40 mg total) by mouth 2 (two) times daily. 05/17/13  Yes Thurnell Lose, MD  potassium chloride SA (K-DUR,KLOR-CON) 20 MEQ tablet Take 2 tablets (40 mEq total) by mouth daily. Patient taking differently: Take 20 mEq by mouth daily.  02/12/14  Yes Eugenie Filler, MD  sildenafil (VIAGRA) 100 MG tablet Take 100 mg by mouth daily as needed for erectile dysfunction.   Yes [provider]  simvastatin (ZOCOR) 80 MG tablet Take 80 mg by mouth daily.   Yes [provider]  thiamine 100 MG tablet Take 1 tablet (100 mg total) by mouth daily. 05/17/13  Yes Thurnell Lose, MD  triamcinolone cream (KENALOG) 0.1 % Apply 1 application topically 2 (two) times daily. Apply as directed to affected area bid.   Yes [provider]  TURMERIC PO Take 1 tablet by mouth daily.   Yes [provider]    ALLERGIES:  Allergies  Allergen Reactions  . Morphine And Related Nausea And Vomiting    SOCIAL HISTORY:  Social History   Socioeconomic History  . Marital status: Single    Spouse name: Not on file  . Number of children: Not on file  . Years of education: Not on file  . Highest education level: Not on file  Occupational History  . Not on file  Social Needs  . Financial resource strain: Not on file  . Food insecurity:    Worry: Not on file    Inability: Not on file  . Transportation needs:    Medical: Not on file    Non-medical: Not on file  Tobacco Use  . Smoking status: Never Smoker  . Smokeless tobacco: Never Used  Substance and Sexual Activity  . Alcohol use: No  . Drug use: No  . Sexual activity: Not Currently  Lifestyle  . Physical activity:    Days per week: Not on file    Minutes per session: Not on file  . Stress: Not on file  Relationships  . Social  connections:    Talks on phone: Not on file    Gets together: Not on file  Attends religious service: Not on file    Active member of club or organization: Not on file    Attends meetings of clubs or organizations: Not on file    Relationship status: Not on file  . Intimate partner violence:    Fear of current or ex partner: Not on file    Emotionally abused: Not on file    Physically abused: Not on file    Forced sexual activity: Not on file  Other Topics Concern  . Not on file  Social History Narrative  . Not on file    The patient currently resides (home / rehab facility / nursing home): Home The patient normally is (ambulatory / bedbound): Minimally ambulatory  FAMILY HISTORY:  Family History  Problem Relation Age of Onset  . Lymphoma Father   . Hypertension Mother   . Diabetes Mother     Otherwise negative/non-contributory.  REVIEW OF SYSTEMS:  Constitutional: denies any other weight loss, fever, chills, or sweats  Eyes: denies any other vision changes, history of eye injury  ENT: denies sore throat, hearing problems  Respiratory: denies shortness of breath, wheezing  Cardiovascular: denies chest pain, palpitations  Gastrointestinal: abdominal pain, N/V, and bowel function as per HPI Musculoskeletal: denies any other joint pains or cramps  Skin: Denies any other rashes or skin discolorations except as per HPI Neurological: denies any other headache, dizziness, weakness  Psychiatric: Denies any other depression, anxiety   All other review of systems were otherwise negative   VITAL SIGNS:  BP (!) 154/102   Pulse (!) 101   Temp (!) 96.6 F (35.9 C) (Temporal)   Resp (!) 22   Ht 6' (1.829 m)   Wt (!) 325 lb (147.4 kg)   SpO2 97%   BMI 44.08 kg/m   PHYSICAL EXAM:  Constitutional:  -- Obese body habitus  -- Awake, alert, and oriented x3  Eyes:  -- Pupils equally round and reactive to light  -- No scleral icterus  Ear, nose, throat:  -- No jugular venous  distension -- No nasal drainage, bleeding Pulmonary:  -- No crackles  -- Equal breath sounds bilaterally -- Breathing non-labored at rest Cardiovascular:  -- S1, S2 present  -- No pericardial rubs  Gastrointestinal:  -- Abdomen soft, nontender, non-distended, no guarding/rebound tenderness -- Easily reducible Right-sided abdominal wall hernia at Right lateral aspect of otherwise well-healed Chevron-type post-surgical incisional scar without surrounding erythema or drainage -- 2 - 3 mm NT epigastric fistula with scant clear - yellow drainage and no surrounding erythema -- No other abdominal masses appreciated, pulsatile or otherwise  Musculoskeletal and Integumentary:  -- Wounds or skin discoloration: None appreciated except as described above (GI) -- Extremities: B/L UE and LE FROM, hands and feet warm  Neurologic:  -- Motor function: Intact and symmetric -- Sensation: Intact and symmetric  Labs:  CBC Latest Ref Rng & Units 02/08/2017 01/22/2016 01/05/2015  WBC 3.8 - 10.6 K/uL 9.0 5.4 4.5  Hemoglobin 13.0 - 18.0 g/dL 14.6 12.9(L) 10.8(L)  Hematocrit 40.0 - 52.0 % 45.7 40.4 37.0(L)  Platelets 150 - 440 K/uL 168 202 110(L)   CMP Latest Ref Rng & Units 02/08/2017 01/22/2016 01/08/2015  Glucose 65 - 99 mg/dL 217(H) 184(H) 143(H)  BUN 6 - 20 mg/dL 21(H) 21(H) 9  Creatinine 0.61 - 1.24 mg/dL 0.99 1.11 0.95  Sodium 135 - 145 mmol/L 138 140 143  Potassium 3.5 - 5.1 mmol/L 4.1 4.2 3.1(L)  Chloride 101 - 111 mmol/L 102 108 110  CO2 22 - 32 mmol/L _0 Calcium 8.9 - 10.3 mg/dL 9.4 9.3 8.8(L)  Total Protein 6.5 - 8.1 g/dL 10.0(H) 8.9(H) -  Total Bilirubin 0.3 - 1.2 mg/dL 0.7 1.0 -  Alkaline Phos 38 - 126 U/L 69 52 -  AST 15 - 41 U/L 18 16 -  ALT 17 - 63 U/L 13(L) 13(L) -   Imaging studies:  CT Abdomen and Pelvis ([facility], uploaded 03/22/2018) - personally reviewed regarding rather large Right-sided abdominal wall hernia without radiographic evidence of obstruction or  incarceration, no radiology interpretation available at time of patient's appointment  Assessment/Plan:  63 y.o. 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   - signs/symptoms of hernia incarceration and obstruction discussed  - maintain hydration and high fiber diet as able considering ESRD and other comorbidities  - considering patient's comorbidities, it seems some of his comorbidities offer potentially life-threatening challenges of greater urgency, but also for which referral to a university surgeon would be advised for the resources available to provide peri-operative supportive care if repair is attempted  - return to clinic as needed, instructed to call if any questions or concerns  - medical management as per [facility] primary medical physician  All of the above recommendations were discussed with the patient, and all of patient's questions were answered to his expressed satisfaction.  Thank you for the opportunity to participate in this patient's care.  -- Marilynne Drivers Rosana Hoes, MD, [place]: [facility] - Partnering for exceptional care. Office: [phone]

## 2018-03-31 NOTE — Patient Instructions (Addendum)
We will send the referral to [facility] for care of the ventral hernia.   Ventral Hernia  A ventral hernia is a bulge of tissue from inside the abdomen that pushes through a weak area of the muscles that form the front wall of the abdomen. The tissues inside the abdomen are inside a sac (peritoneum). These tissues include the small intestine, large intestine, and the fatty tissue that covers the intestines (omentum). Sometimes, the bulge that forms a hernia contains intestines. Other hernias contain only fat. Ventral hernias do not go away without surgical treatment. There are several types of ventral hernias. You may have:  A hernia at an incision site from previous abdominal surgery (incisional hernia).  A hernia just above the belly button (epigastric hernia), or at the belly button (umbilical hernia). These types of hernias can develop from heavy lifting or straining.  A hernia that comes and goes (reducible hernia). It may be visible only when you lift or strain. This type of hernia can be pushed back into the abdomen (reduced).  A hernia that traps abdominal tissue inside the hernia (incarcerated hernia). This type of hernia does not reduce.  A hernia that cuts off blood flow to the tissues inside the hernia (strangulated hernia). The tissues can start to die if this happens. This is a very painful bulge that cannot be reduced. A strangulated hernia is a medical emergency. What are the causes? This condition is caused by abdominal tissue putting pressure on an area of weakness in the abdominal muscles. What increases the risk? The following factors may make you more likely to develop this condition:  Being male.  Being 43 or older.  Being overweight or obese.  Having had previous abdominal surgery, especially if there was an infection after surgery.  Having had an injury to the abdominal wall.  Having had several pregnancies.  Having a buildup of fluid inside the abdomen  (ascites). What are the signs or symptoms? The only symptom of a ventral hernia may be a painless bulge in the abdomen. A reducible hernia may be visible only when you strain, cough, or lift. Other symptoms may include:  Dull pain.  A feeling of pressure. Signs and symptoms of a strangulated hernia may include:  Increasing pain.  Nausea and vomiting.  Pain when pressing on the hernia.  The skin over the hernia turning red or purple.  Constipation.  Blood in the stool (feces). How is this diagnosed? This condition may be diagnosed based on:  Your symptoms.  Your medical history.  A physical exam. You may be asked to cough or strain while standing. These actions increase the pressure inside your abdomen and force the hernia through the opening in your muscles. Your health care provider may try to reduce the hernia by pressing on it.  Imaging studies, such as an ultrasound or CT scan. How is this treated? This condition is treated with surgery. If you have a strangulated hernia, surgery is done as soon as possible. If your hernia is small and not incarcerated, you may be asked to lose some weight before surgery. Follow these instructions at home:  Follow instructions from your health care provider about eating or drinking restrictions.  If you are overweight, your health care provider may recommend that you increase your activity level and eat a healthier diet.  Do not lift anything that is heavier than 10 lb (4.5 kg).  Return to your normal activities as told by your health care provider. Ask your  health care provider what activities are safe for you. You may need to avoid activities that increase pressure on your hernia.  Take over-the-counter and prescription medicines only as told by your health care provider.  Keep all follow-up visits as told by your health care provider. This is important. Contact a health care provider if:  Your hernia gets larger.  Your hernia  becomes painful. Get help right away if:  Your hernia becomes increasingly painful.  You have pain along with any of the following: ? Changes in skin color in the area of the hernia. ? Nausea. ? Vomiting. ? Fever. Summary  A ventral hernia is a bulge of tissue from inside the abdomen that pushes through a weak area of the muscles that form the front wall of the abdomen.  This condition is treated with surgery, which may be urgent depending on your hernia.  Do not lift anything that is heavier than 10 lb (4.5 kg), and follow activity instructions from your health care provider. This information is not intended to replace advice given to you by your health care provider. Make sure you discuss any questions you have with your health care provider. Document Released: 02/24/2012 Document Revised: 04/21/2017 Document Reviewed: 09/28/2016 Elsevier Interactive Patient Education  2019 [employer].

## 2018-04-05 ENCOUNTER — Telehealth: Payer: Self-pay | Admitting: Surgery

## 2018-04-05 ENCOUNTER — Encounter: Payer: Self-pay | Admitting: Surgery

## 2018-04-05 DIAGNOSIS — K8689 Other specified diseases of pancreas: Secondary | ICD-10-CM | POA: Insufficient documentation

## 2018-04-05 DIAGNOSIS — K432 Incisional hernia without obstruction or gangrene: Secondary | ICD-10-CM | POA: Insufficient documentation

## 2018-04-05 NOTE — Telephone Encounter (Signed)
I have called [place] in reference to an outgoing referral that we are needing to submit to [facility]. I left a message on voicemail for [facility] [phone] ext [phone].  Dr Rosana Hoes has referred this patient to [facility] due to the need of him being seen by a general surgeon at a tertiary facility.   I have also faxed the [place] for Services form to [phone].  I have contacted the patient with the information.  I will continue to follow up and still awaiting phone call from [facility].

## 2018-04-06 NOTE — Telephone Encounter (Signed)
Larene Beach from the [place] has called me back and requested me to fax a community care provider request for service form including Dr Rosana Hoes' office notes. Once this is received, they will contact the patient and the daughter to discuss which facility the patient would like to be seen at-[facility] or [facility].   All information has been faxed to [phone].   She stated that the referral process could take up to 5 business days.

## 2020-08-01 ENCOUNTER — Telehealth: Payer: Self-pay

## 2020-08-01 NOTE — Telephone Encounter (Signed)
NOTES ON FILE FROM [place] [facility] [phone] SENT REFERRAL TO SCHEDULING

## 2020-11-20 ENCOUNTER — Other Ambulatory Visit: Payer: Self-pay

## 2020-11-20 ENCOUNTER — Ambulatory Visit (INDEPENDENT_AMBULATORY_CARE_PROVIDER_SITE_OTHER): Payer: No Typology Code available for payment source | Admitting: Endocrinology

## 2020-11-20 VITALS — BP 140/64 | HR 105 | Ht 72.0 in | Wt 283.9 lb

## 2020-11-20 DIAGNOSIS — Z794 Long term (current) use of insulin: Secondary | ICD-10-CM

## 2020-11-20 DIAGNOSIS — E1142 Type 2 diabetes mellitus with diabetic polyneuropathy: Secondary | ICD-10-CM | POA: Diagnosis not present

## 2020-11-20 LAB — POCT GLYCOSYLATED HEMOGLOBIN (HGB A1C): Hemoglobin A1C: 11 % — AB (ref 4.0–5.6)

## 2020-11-20 MED ORDER — FREESTYLE LIBRE 2 SENSOR MISC: 1.0000 | 3 refills | Status: DC

## 2020-11-20 MED ORDER — FREESTYLE LIBRE 2 READER DEVI: 1.0000 | Freq: Once | 1 refills | Status: AC

## 2020-11-20 NOTE — Patient Instructions (Addendum)
good diet and exercise significantly improve the control of your diabetes.  please let me know if you wish to be referred to a dietician.  high blood sugar is very risky to your health.  you should see an eye doctor and dentist every year.  It is very important to get all recommended vaccinations.  Controlling your blood pressure and cholesterol drastically reduces the damage diabetes does to your body.  Those who smoke should quit.  Please discuss these with your doctor.  check your blood sugar twice a day.  vary the time of day when you check, between before the 3 meals, and at bedtime.  also check if you have symptoms of your blood sugar being too high or too low.  please keep a record of the readings and bring it to your next appointment here (or you can bring the meter itself).  You can write it on any piece of paper.  please call us sooner if your blood sugar goes below 70, or if most of your readings are over 200. Please consider the advice we have discussed, and let us know if you wish to continue your diabetes care here.

## 2020-11-20 NOTE — Progress Notes (Signed)
Subjective:    Patient ID: Don Brown, male    DOB: 1955-09-26, 65 y.o.   MRN: [id number]  HPI pt is referred by Dr Rondell Reams, for diabetes.  Pt states he does not know when DM was dx'ed; it is complicated by PN; he has been on insulin since 2015; pt says his diet is fair, and exercise is limited by health problems; he has never had severe hypoglycemia or DKA.  He had a pancreatic stent in 2011, at [facility], for recurrent pancreatitis.  2 children help pt with DM care.  He takes Lantus 32-42 units QHS, and PRN Novolog (averages a total of approx 40 units/day).  He says cbg varies from 74-500.  Pt says he is extremely discouraged about his health and health care in general.  During the visit, pt repeatedly voices his disagreement with any treatment plan.  For example, he disagrees with, and then declines several attempts by the staff and I to explain the basis of hemoglobin A1c.  Past Medical History:  Diagnosis Date   Anemia    Anxiety    Atrial fibrillation (HCC)    Cervical compression fracture (HCC)    Cervical disk disease requiring surgery   CHF (congestive heart failure) (HCC)    Chronic pain    Chronic systolic heart failure (HCC)    LVEF 40-45%   Degenerative joint disease    Depression    Essential hypertension, benign    Headache    "stress"   Heart murmur    History of blood transfusion 2007   "related to OR"   History of renal failure    Requiring hemodialysis in the past with normal renal function at this point   History of ventral hernia repair    Hyperlipidemia    MGUS (monoclonal gammopathy of unknown significance)    "or myelona" (05/07/2014)   Mitral valve prolapse    Morbid obesity ([place])    Myeloma ([place])    "or MGUS" (05/07/2014)   Narcotic dependence, episodic use ([place])    On home oxygen therapy    "2L prn" (05/07/2014)   Pancreatitis    Severe pancreatitis status post surgery with 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 status post multiple endoscopic  drainage procedures and stents    Respiratory failure ([place])    History of ventilatory-dependent respiratory failure and tracheostomy    Sleep apnea    "don't wear mask" (05/07/2014)   Type 2 diabetes mellitus ([place])     Past Surgical History:  Procedure Laterality Date   BONE MARROW BIOPSY  02/2014   CATARACT EXTRACTION W/ INTRAOCULAR LENS  IMPLANT, BILATERAL Bilateral    CHOLECYSTECTOMY     HERNIA REPAIR     INSERTION OF MESH  10/2006   IR RADIOLOGIST EVAL & MGMT  08/18/2017   PANCREAS SURGERY     "placed stents; multiple pseudo cysts removal"   SKIN GRAFT     TRACHEOSTOMY  2007   VENTRAL HERNIA REPAIR      Social History   Socioeconomic History   Marital status: Single    Spouse name: Not on file   Number of children: Not on file   Years of education: Not on file   Highest education level: Not on file  Occupational History   Not on file  Tobacco Use   Smoking status: Never   Smokeless tobacco: Never  Substance and Sexual Activity   Alcohol use: No   Drug use: No   Sexual activity: Not Currently  Other Topics Concern   Not on file  Social History Narrative   Not on file   Social Determinants of Health   Financial Resource Strain: Not on file  Food Insecurity: Not on file  Transportation Needs: Not on file  Physical Activity: Not on file  Stress: Not on file  Social Connections: Not on file  Intimate Partner Violence: Not on file    Current Outpatient Medications on File Prior to Visit  Medication Sig Dispense Refill   APPLE CIDER VINEGAR PO Take 1 tablet by mouth 3 (three) times daily.     aspirin 81 MG tablet Take 81 mg by mouth daily before breakfast.      benazepril (LOTENSIN) 10 MG tablet Take 10 mg by mouth daily. Take 1/2 tablet daily by mouth     Carboxymethylcellulose Sod PF 0.5 % SOLN Apply 1 drop to eye 5 (five) times daily. 1 drop in each eye 5 times/day     carvedilol (COREG) 25 MG tablet Take 25 mg by mouth 2 (two) times daily with a meal. Takes  1/2 tablet by mouth bid.     Cholecalciferol 2000 units TABS Take 1 tablet by mouth daily.     Cyanocobalamin (VITAMIN B 12 PO) Take 1 tablet by mouth daily.      digoxin (LANOXIN) 0.125 MG tablet Take 1 tablet (125 mcg total) by mouth every morning. 31 tablet 0   escitalopram (LEXAPRO) 10 MG tablet Take 10 mg by mouth daily.     folic acid (FOLVITE) 1 MG tablet Take 1 tablet (1 mg total) by mouth daily. 30 tablet 0   furosemide (LASIX) 40 MG tablet Take 1 tablet (40 mg total) by mouth 2 (two) times daily. Hold for the next 3 days, then resume on 01/12/2015 (Patient taking differently: Take 40 mg by mouth daily. Hold for the next 3 days, then resume on 01/12/2015) 30 tablet    gabapentin (NEURONTIN) 600 MG tablet Take 600 mg by mouth 3 (three) times daily.     HYDROcodone-acetaminophen (NORCO/VICODIN) 5-325 MG tablet Take 1 tablet by mouth every 6 (six) hours as needed for moderate pain. 10 tablet 0   HYDROmorphone (DILAUDID) 2 MG tablet Take 2 mg by mouth every 4 (four) hours as needed for severe pain.     insulin aspart (NOVOLOG) 100 UNIT/ML injection Before each meal 3 times a day and then bedtime, 140-199 - 2 units, 200-250 - 4 units, 251-299 - 6 units,  300-349 - 8 units,  350 or above 10 units. Dispense syringes and needles as needed, Ok to switch to PEN if approved. Okay to switch to any approved short-acting insulin. 1 vial 12   insulin glargine (LANTUS) 100 UNIT/ML injection Inject 20-40 Units into the skin at bedtime. Takes 20 - 40 units depending on blood sugar reading. Confirmed with pt.     metoprolol succinate (TOPROL-XL) 50 MG 24 hr tablet Take 1 tablet (50 mg total) by mouth every morning. 30 tablet 0   Misc Natural Products (GINSENG COMPLEX PO) Take 1 tablet by mouth daily.     Omega-3 1000 MG CAPS Take 1 capsule by mouth daily.     ondansetron (ZOFRAN ODT) 4 MG disintegrating tablet Take 1 tablet (4 mg total) every 8 (eight) hours as needed by mouth for nausea or vomiting. 20 tablet  0   ondansetron (ZOFRAN) 4 MG tablet Take 1 tablet (4 mg total) by mouth every 8 (eight) hours as needed for nausea or vomiting. 12 tablet  0   ondansetron (ZOFRAN) 4 MG tablet Take 1 tablet (4 mg total) by mouth every 8 (eight) hours as needed for nausea or vomiting. 12 tablet 0   OXcarbazepine (TRILEPTAL) 150 MG tablet Take 150 mg by mouth 2 (two) times daily. Take 2 tablets by mouth 3 times daily for headaches.     Oxycodone HCl 10 MG TABS Take 1 tablet (10 mg total) every 6 (six) hours as needed by mouth. 9 tablet 0   Pancrelipase, Lip-Prot-Amyl, 24000-76000 units CPEP Take 24,000 Units by mouth 3 (three) times daily.     pantoprazole (PROTONIX) 40 MG tablet Take 1 tablet (40 mg total) by mouth 2 (two) times daily. 60 tablet 0   potassium chloride SA (K-DUR,KLOR-CON) 20 MEQ tablet Take 2 tablets (40 mEq total) by mouth daily. (Patient taking differently: Take 20 mEq by mouth daily.) 62 tablet 0   sildenafil (VIAGRA) 100 MG tablet Take 100 mg by mouth daily as needed for erectile dysfunction.     simvastatin (ZOCOR) 80 MG tablet Take 80 mg by mouth daily.     thiamine 100 MG tablet Take 1 tablet (100 mg total) by mouth daily. 30 tablet 0   triamcinolone cream (KENALOG) 0.1 % Apply 1 application topically 2 (two) times daily. Apply as directed to affected area bid.     TURMERIC PO Take 1 tablet by mouth daily.     No current facility-administered medications on file prior to visit.    Allergies  Allergen Reactions   Morphine And Related Nausea And Vomiting    Family History  Problem Relation Age of Onset   Lymphoma Father    Hypertension Mother    Diabetes Mother     BP 140/64 (BP Location: Right Arm, Patient Position: Sitting, Cuff Size: Large)   Pulse (!) 105   Ht 6' (1.829 m)   Wt 283 lb 14.4 oz (128.8 kg)   SpO2 97%   BMI 38.50 kg/m    Review of Systems denies sob, n/v, and depression.  He has lost 70 lbs x several years. He has memory loss.      Objective:   Physical  Exam Pulses: dorsalis pedis intact bilat.   MSK: no deformity of the feet CV: no leg edema Skin:  no ulcer on the feet, but the skin is dry and thickened.  normal color and temp on the feet. Neuro: sensation is intact to touch on the feet  Lab Results  Component Value Date   HGBA1C 11.0 (A) 11/20/2020   I have reviewed outside records, and summarized: Pt was noted to have elevated A1c, and referred here.  [facility] attempted to [facility] DM f/u for him after retirement of previous dr, but pt was resistant to these efforts.    Assessment & Plan:  DM, uncertain type, uncontrolled.  Chronic pancreatitis: uncertain etiologic relationship between this and DM.   Difficult relationship with health care providers.  This significantly worsens his prognosis.   Patient Instructions  good diet and exercise significantly improve the control of your diabetes.  please let me know if you wish to be referred to a dietician.  high blood sugar is very risky to your health.  you should see an eye doctor and dentist every year.  It is very important to get all recommended vaccinations.  Controlling your blood pressure and cholesterol drastically reduces the damage diabetes does to your body.  Those who smoke should quit.  Please discuss these with your doctor.  check  your blood sugar twice a day.  vary the time of day when you check, between before the 3 meals, and at bedtime.  also check if you have symptoms of your blood sugar being too high or too low.  please keep a record of the readings and bring it to your next appointment here (or you can bring the meter itself).  You can write it on any piece of paper.  please call us sooner if your blood sugar goes below 70, or if most of your readings are over 200. Please consider the advice we have discussed, and let us know if you wish to continue your diabetes care here.

## 2020-12-13 ENCOUNTER — Ambulatory Visit (INDEPENDENT_AMBULATORY_CARE_PROVIDER_SITE_OTHER): Payer: No Typology Code available for payment source | Admitting: Endocrinology

## 2020-12-13 ENCOUNTER — Other Ambulatory Visit: Payer: Self-pay

## 2020-12-13 ENCOUNTER — Encounter: Payer: No Typology Code available for payment source | Attending: Endocrinology | Admitting: Dietician

## 2020-12-13 VITALS — BP 100/70 | HR 99 | Ht 72.0 in | Wt 288.0 lb

## 2020-12-13 DIAGNOSIS — Z794 Long term (current) use of insulin: Secondary | ICD-10-CM

## 2020-12-13 DIAGNOSIS — E1142 Type 2 diabetes mellitus with diabetic polyneuropathy: Secondary | ICD-10-CM

## 2020-12-13 MED ORDER — FREESTYLE LIBRE 2 READER DEVI: 1.0000 | Freq: Once | 1 refills | Status: AC

## 2020-12-13 NOTE — Patient Instructions (Addendum)
check your blood sugar twice a day.  vary the time of day when you check, between before the 3 meals, and at bedtime.  also check if you have symptoms of your blood sugar being too high or too low.  please keep a record of the readings and bring it to your next appointment here (or you can bring the meter itself).  You can write it on any piece of paper.  please call us sooner if your blood sugar goes below 70, or if most of your readings are over 200. Please continue the same insulins. Burns Spain is requesting to reopen discussion about his h/o pancreatic stent.  He will ask primary care provider at [facility].   Please come back for a follow-up appointment in 1 month.   We'll download the continuous glucose monitor data then.

## 2020-12-13 NOTE — Progress Notes (Signed)
Patient is here today with his son.  He is a walk visit from Dr. Loanne Drilling.  He states that he has the [employer] but his phone is not compatible.  He is not sure if he has the reader. He states that he has read that Vitamin C causes false sensor readings. Discussed that more than 500 mg of vitamin C daily can cause false sensor readings.  He states that he takes more than 500 mg vitamin C daily for his immune system and he is not sure if he wishes to stop this. Discussed that it is unlikely that he is absorbing the amount of vitamin C taken and that an intake of less than 500 mg would be acceptable.  Patient is considering. He was agreeable to the [facility] and MD ordered the reader.  Patient verbalized his anger about his health, pancreatic issues, weight gain and diabetes.  He states that he does not want to become reliant on insulin. Discussed with patient that due to pancreatic damage he is likely not producing the insulin he needs. Patient states that he is taking 42 units of Lantus on some days and will take only 36 units of Lantus if his glucose is 200. He reports taking the full dose of sliding scale Novolog. He states that he checks his blood glucose 3 times daily. Patient reports that he is also taking Creon with meals. He receives meals on wheels and was instructed to drink Glucerna daily. Reports "pancreatic attack" last week.  Asked patient if greasy foods increased symptoms.  Patient verbalized this.  He has craved increased popcorn recently.  History includes Type 2 diabetes, pancreatitis A1C:  11% 11/20/2020 Medications include:  Novolog sliding scale (2 per 50 points above 140), Lantus 20-40 units per blood glucose reading per prescription.  Patient's son and daughter live with him.  He receives Meals on Wheels.  Drinks water and [place] water flavored with regular lemonade.  Patient reports being aware of the carbohydrate in the drink mix.  Denies alcohol.  Discussed fasting  blood glucose goal of 130.  Discussed pre and post meal blood glucose and how to evaluate. Patient states that everyone is different and his goal may be different than everyone else's goals.  Educated that damage occurs when blood glucose is elevated.  Patient verbalized that wound healing is worse when blood glucose is elevated.  States he needs improved control for surgery.  Encouraged patient to follow up with myself after his next visit with Dr. Loanne Drilling. He states that he sees a nutritionist at the [place].  Patient to call for questions.  Antonieta Iba, RD, LDN, CDCES

## 2020-12-13 NOTE — Progress Notes (Signed)
Subjective:    Patient ID: Don Brown, male    DOB: [birth year], 65 y.o.   MRN: [id number]  HPI Pt returns for f/u of diabetes mellitus: DM type: Insulin-requiring type 2 Dx'ed: uncertain date (many years ago) Complications: PN Therapy: insulin since 2015 DKA: never Severe hypoglycemia: never Pancreatitis: never Pancreatic imaging: CT (2019): chronic pancreatitis.  SDOH: pt attributes DM and general deterioration in his health to removal of pancreatic stent; 2 children help pt with DM care; he is chronically dissatisfied with his health care in general.   Other: he takes multiple daily injections Interval history: Pt says when pancreatic stent was in place, he feels much better, and did not require insulin.  Case is discussed with CDE, who helps with continuous glucose monitor.   Past Medical History:  Diagnosis Date   Anemia    Anxiety    Atrial fibrillation (HCC)    Cervical compression fracture (HCC)    Cervical disk disease requiring surgery   CHF (congestive heart failure) (HCC)    Chronic pain    Chronic systolic heart failure (HCC)    LVEF 40-45%   Degenerative joint disease    Depression    Essential hypertension, benign    Headache    "stress"   Heart murmur    History of blood transfusion 2007   "related to OR"   History of renal failure    Requiring hemodialysis in the past with normal renal function at this point   History of ventral hernia repair    Hyperlipidemia    MGUS (monoclonal gammopathy of unknown significance)    "or myelona" (05/07/2014)   Mitral valve prolapse    Morbid obesity ([place])    Myeloma ([place])    "or MGUS" (05/07/2014)   Narcotic dependence, episodic use ([place])    On home oxygen therapy    "2L prn" (05/07/2014)   Pancreatitis    Severe pancreatitis status post surgery with 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 status post multiple endoscopic drainage procedures and stents    Respiratory failure ([place])    History of  ventilatory-dependent respiratory failure and tracheostomy    Sleep apnea    "don't wear mask" (05/07/2014)   Type 2 diabetes mellitus ([place])     Past Surgical History:  Procedure Laterality Date   BONE MARROW BIOPSY  02/2014   CATARACT EXTRACTION W/ INTRAOCULAR LENS  IMPLANT, BILATERAL Bilateral    CHOLECYSTECTOMY     HERNIA REPAIR     INSERTION OF MESH  10/2006   IR RADIOLOGIST EVAL & MGMT  08/18/2017   PANCREAS SURGERY     "placed stents; multiple pseudo cysts removal"   SKIN GRAFT     TRACHEOSTOMY  2007   VENTRAL HERNIA REPAIR      Social History   Socioeconomic History   Marital status: Single    Spouse name: Not on file   Number of children: Not on file   Years of education: Not on file   Highest education level: Not on file  Occupational History   Not on file  Tobacco Use   Smoking status: Never   Smokeless tobacco: Never  Substance and Sexual Activity   Alcohol use: No   Drug use: No   Sexual activity: Not Currently  Other Topics Concern   Not on file  Social History Narrative   Not on file   Social Determinants of Health   Financial Resource Strain: Not on file  Food Insecurity: Not on file  Transportation Needs: Not on file  Physical Activity: Not on file  Stress: Not on file  Social Connections: Not on file  Intimate Partner Violence: Not on file    Current Outpatient Medications on File Prior to Visit  Medication Sig Dispense Refill   APPLE CIDER VINEGAR PO Take 1 tablet by mouth 3 (three) times daily.     aspirin 81 MG tablet Take 81 mg by mouth daily before breakfast.      benazepril (LOTENSIN) 10 MG tablet Take 10 mg by mouth daily. Take 1/2 tablet daily by mouth     Carboxymethylcellulose Sod PF 0.5 % SOLN Apply 1 drop to eye 5 (five) times daily. 1 drop in each eye 5 times/day     carvedilol (COREG) 25 MG tablet Take 25 mg by mouth 2 (two) times daily with a meal. Takes 1/2 tablet by mouth bid.     Cholecalciferol 2000 units TABS Take 1 tablet  by mouth daily.     Continuous Blood Gluc Sensor (FREESTYLE LIBRE 2 SENSOR) MISC 1 Device by Does not apply route every 14 (fourteen) days. 6 each 3   Cyanocobalamin (VITAMIN B 12 PO) Take 1 tablet by mouth daily.      digoxin (LANOXIN) 0.125 MG tablet Take 1 tablet (125 mcg total) by mouth every morning. 31 tablet 0   escitalopram (LEXAPRO) 10 MG tablet Take 10 mg by mouth daily.     folic acid (FOLVITE) 1 MG tablet Take 1 tablet (1 mg total) by mouth daily. 30 tablet 0   furosemide (LASIX) 40 MG tablet Take 1 tablet (40 mg total) by mouth 2 (two) times daily. Hold for the next 3 days, then resume on 01/12/2015 (Patient taking differently: Take 40 mg by mouth daily. Hold for the next 3 days, then resume on 01/12/2015) 30 tablet    gabapentin (NEURONTIN) 600 MG tablet Take 600 mg by mouth 3 (three) times daily.     HYDROcodone-acetaminophen (NORCO/VICODIN) 5-325 MG tablet Take 1 tablet by mouth every 6 (six) hours as needed for moderate pain. 10 tablet 0   HYDROmorphone (DILAUDID) 2 MG tablet Take 2 mg by mouth every 4 (four) hours as needed for severe pain.     insulin aspart (NOVOLOG) 100 UNIT/ML injection Before each meal 3 times a day and then bedtime, 140-199 - 2 units, 200-250 - 4 units, 251-299 - 6 units,  300-349 - 8 units,  350 or above 10 units. Dispense syringes and needles as needed, Ok to switch to PEN if approved. Okay to switch to any approved short-acting insulin. 1 vial 12   insulin glargine (LANTUS) 100 UNIT/ML injection Inject 20-40 Units into the skin at bedtime. Takes 20 - 40 units depending on blood sugar reading. Confirmed with pt.     metoprolol succinate (TOPROL-XL) 50 MG 24 hr tablet Take 1 tablet (50 mg total) by mouth every morning. 30 tablet 0   Misc Natural Products (GINSENG COMPLEX PO) Take 1 tablet by mouth daily.     Omega-3 1000 MG CAPS Take 1 capsule by mouth daily.     ondansetron (ZOFRAN ODT) 4 MG disintegrating tablet Take 1 tablet (4 mg total) every 8 (eight)  hours as needed by mouth for nausea or vomiting. 20 tablet 0   ondansetron (ZOFRAN) 4 MG tablet Take 1 tablet (4 mg total) by mouth every 8 (eight) hours as needed for nausea or vomiting. 12 tablet 0   ondansetron (ZOFRAN) 4 MG tablet Take 1 tablet (4 mg  total) by mouth every 8 (eight) hours as needed for nausea or vomiting. 12 tablet 0   OXcarbazepine (TRILEPTAL) 150 MG tablet Take 150 mg by mouth 2 (two) times daily. Take 2 tablets by mouth 3 times daily for headaches.     Oxycodone HCl 10 MG TABS Take 1 tablet (10 mg total) every 6 (six) hours as needed by mouth. 9 tablet 0   Pancrelipase, Lip-Prot-Amyl, 24000-76000 units CPEP Take 24,000 Units by mouth 3 (three) times daily.     pantoprazole (PROTONIX) 40 MG tablet Take 1 tablet (40 mg total) by mouth 2 (two) times daily. 60 tablet 0   potassium chloride SA (K-DUR,KLOR-CON) 20 MEQ tablet Take 2 tablets (40 mEq total) by mouth daily. (Patient taking differently: Take 20 mEq by mouth daily.) 62 tablet 0   sildenafil (VIAGRA) 100 MG tablet Take 100 mg by mouth daily as needed for erectile dysfunction.     simvastatin (ZOCOR) 80 MG tablet Take 80 mg by mouth daily.     thiamine 100 MG tablet Take 1 tablet (100 mg total) by mouth daily. 30 tablet 0   triamcinolone cream (KENALOG) 0.1 % Apply 1 application topically 2 (two) times daily. Apply as directed to affected area bid.     TURMERIC PO Take 1 tablet by mouth daily.     No current facility-administered medications on file prior to visit.    Allergies  Allergen Reactions   Morphine And Related Nausea And Vomiting    Family History  Problem Relation Age of Onset   Lymphoma Father    Hypertension Mother    Diabetes Mother     BP 100/70 (BP Location: Left Arm, Patient Position: Sitting, Cuff Size: Large)   Pulse 99   Ht 6' (1.829 m)   Wt 288 lb (130.6 kg)   SpO2 92%   BMI 39.06 kg/m   Review of Systems     Objective:   Physical Exam       Assessment & Plan:   Insulin-requiring type 2 DM: uncontrolled  Patient Instructions  check your blood sugar twice a day.  vary the time of day when you check, between before the 3 meals, and at bedtime.  also check if you have symptoms of your blood sugar being too high or too low.  please keep a record of the readings and bring it to your next appointment here (or you can bring the meter itself).  You can write it on any piece of paper.  please call us sooner if your blood sugar goes below 70, or if most of your readings are over 200. Please continue the same insulins. Burns [place] is requesting to reopen discussion about his h/o pancreatic stent.  He will ask primary care provider at [facility].   Please come back for a follow-up appointment in 1 month.   We'll download the continuous glucose monitor data then.

## 2020-12-20 ENCOUNTER — Emergency Department: Payer: No Typology Code available for payment source

## 2020-12-20 ENCOUNTER — Emergency Department
Admission: EM | Admit: 2020-12-20 | Discharge: 2020-12-20 | Disposition: A | Discharge status: Home / Self Care | Payer: No Typology Code available for payment source | Attending: Emergency Medicine | Admitting: Emergency Medicine

## 2020-12-20 ENCOUNTER — Other Ambulatory Visit: Payer: Self-pay

## 2020-12-20 DIAGNOSIS — I110 Hypertensive heart disease with heart failure: Secondary | ICD-10-CM | POA: Insufficient documentation

## 2020-12-20 DIAGNOSIS — I5043 Acute on chronic combined systolic (congestive) and diastolic (congestive) heart failure: Secondary | ICD-10-CM | POA: Insufficient documentation

## 2020-12-20 DIAGNOSIS — Z7982 Long term (current) use of aspirin: Secondary | ICD-10-CM | POA: Insufficient documentation

## 2020-12-20 DIAGNOSIS — R040 Epistaxis: Secondary | ICD-10-CM | POA: Insufficient documentation

## 2020-12-20 DIAGNOSIS — E119 Type 2 diabetes mellitus without complications: Secondary | ICD-10-CM | POA: Diagnosis not present

## 2020-12-20 DIAGNOSIS — Z79899 Other long term (current) drug therapy: Secondary | ICD-10-CM | POA: Diagnosis not present

## 2020-12-20 DIAGNOSIS — Z794 Long term (current) use of insulin: Secondary | ICD-10-CM | POA: Diagnosis not present

## 2020-12-20 LAB — BASIC METABOLIC PANEL
Anion gap: 9 (ref 5–15)
BUN: 16 mg/dL (ref 8–23)
CO2: 30 mmol/L (ref 22–32)
Calcium: 8.8 mg/dL — ABNORMAL LOW (ref 8.9–10.3)
Chloride: 97 mmol/L — ABNORMAL LOW (ref 98–111)
Creatinine, Ser: 0.77 mg/dL (ref 0.61–1.24)
GFR, Estimated: >60 mL/min (ref >60)
Glucose, Bld: 314 mg/dL — ABNORMAL HIGH (ref 70–99)
Potassium: 4 mmol/L (ref 3.5–5.1)
Sodium: 136 mmol/L (ref 135–145)

## 2020-12-20 LAB — CBC
HCT: 43.8 % (ref 39.0–52.0)
Hemoglobin: 14.1 g/dL (ref 13.0–17.0)
MCH: 27.9 pg (ref 26.0–34.0)
MCHC: 32.2 g/dL (ref 30.0–36.0)
MCV: 86.6 fL (ref 80.0–100.0)
Platelets: 173 10*3/uL (ref 150–400)
RBC: 5.06 MIL/uL (ref 4.22–5.81)
RDW: 14.4 % (ref 11.5–15.5)
WBC: 4.3 10*3/uL (ref 4.0–10.5)
nRBC: 0 % (ref 0.0–0.2)

## 2020-12-20 MED ORDER — OXYMETAZOLINE HCL 0.05 % NA SOLN
1.0000 | Freq: Once | NASAL | Status: AC
  Administered 2020-12-20: 1 via NASAL
  Filled 2020-12-20: qty 30

## 2020-12-20 MED ORDER — SILVER NITRATE-POT NITRATE 75-25 % EX MISC
2.0000 | Freq: Once | CUTANEOUS | Status: AC
  Administered 2020-12-20: 2 via TOPICAL
  Filled 2020-12-20: qty 10

## 2020-12-20 MED ORDER — HYDROMORPHONE HCL 1 MG/ML IJ SOLN
1.0000 mg | Freq: Once | INTRAMUSCULAR | Status: AC
Start: 2020-12-20 — End: 2020-12-20
  Administered 2020-12-20: 1 mg via INTRAMUSCULAR
  Filled 2020-12-20: qty 1

## 2020-12-20 MED ORDER — HYDROMORPHONE HCL 1 MG/ML IJ SOLN
1.0000 mg | Freq: Once | INTRAMUSCULAR | Status: AC
  Administered 2020-12-20: 1 mg via INTRAMUSCULAR
  Filled 2020-12-20: qty 1

## 2020-12-20 MED ORDER — LIDOCAINE HCL URETHRAL/MUCOSAL 2 % EX GEL
1.0000 "application " | Freq: Once | CUTANEOUS | Status: AC
  Administered 2020-12-20: 1 via TOPICAL
  Filled 2020-12-20: qty 10

## 2020-12-20 MED ORDER — ONDANSETRON 4 MG PO TBDP
4.0000 mg | ORAL_TABLET | Freq: Once | ORAL | Status: AC
  Administered 2020-12-20: 4 mg via ORAL
  Filled 2020-12-20: qty 1

## 2020-12-20 NOTE — ED Notes (Signed)
Patient placed on 2L [place] in right nostril as requested by Myrene Buddy, MD.

## 2020-12-20 NOTE — Discharge Instructions (Addendum)
I'd recommend using a humidifier or boiling a pot of water for humidification  At least twice a day, apply eucerin/vaseline to your nostrils and nasal cannula to help with moisturizing  Consider asking your doctor for a humidifier for your oxygen  Avoid direct heat exposure/hot air exposure  As we discussed, there are several techniques you can use to prevent or stop nosebleeds in the future.  Keep your nose moist either with saline spray several times a day or by applying a thin layer of Neosporin, bacitracin, or other antibiotic ointment to the inside of your nose once or twice a day.  If the bleeding starts up again, gently blow your nose into a tissue to clear the blood and clots, then apply 1-2 sprays to each affected nostril of over-the-counter Afrin nasal spray (oxymetazoline).   Then squeeze your nose shut tightly and DO NOT PEEK for at least 15 minutes.  This will resolve most nosebleeds.  If you continue to have trouble after trying these techniques, or anything seems out of the ordinary or concerns you, please return tot he Emergency Department.

## 2020-12-20 NOTE — ED Provider Notes (Signed)
[facility] Emergency Department Provider Note  ____________________________________________   Event Date/Time   First MD Initiated Contact with Patient 12/20/20 1135     (approximate)  I have reviewed the triage vital signs and the nursing notes.   HISTORY  Chief Complaint Epistaxis    HPI Don Brown is a 65 y.o. male with complex past medical history here with nosebleed.  The patient states that he was using his oxygen this morning.  He did turn the heat on in the house and believes he was exposed to warm air.  He states that he began bleeding and had a significant amount of blood on of the left then right nostrils.  He has had some associated mild nasal pain.  Denies history of nosebleed.  He is not on blood thinners.  He has applied pressure with some moderate improvement though he continues to have some small amount of bleeding.  No recent trauma.    Past Medical History:  Diagnosis Date   Anemia    Anxiety    Atrial fibrillation (HCC)    Cervical compression fracture (HCC)    Cervical disk disease requiring surgery   CHF (congestive heart failure) (HCC)    Chronic pain    Chronic systolic heart failure (HCC)    LVEF 40-45%   Degenerative joint disease    Depression    Essential hypertension, benign    Headache    "stress"   Heart murmur    History of blood transfusion 2007   "related to OR"   History of renal failure    Requiring hemodialysis in the past with normal renal function at this point   History of ventral hernia repair    Hyperlipidemia    MGUS (monoclonal gammopathy of unknown significance)    "or myelona" (05/07/2014)   Mitral valve prolapse    Morbid obesity ([place])    Myeloma ([place])    "or MGUS" (05/07/2014)   Narcotic dependence, episodic use ([place])    On home oxygen therapy    "2L prn" (05/07/2014)   Pancreatitis    Severe pancreatitis status post surgery with 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 status post  multiple endoscopic drainage procedures and stents    Respiratory failure ([place])    History of ventilatory-dependent respiratory failure and tracheostomy    Sleep apnea    "don't wear mask" (05/07/2014)   Type 2 diabetes mellitus ([place])     Patient Active Problem List   Diagnosis Date Noted   Recurrent ventral incisional hernia 04/05/2018   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   Thrombocytopenia ([place]) 01/05/2015   Chronic pain 05/07/2014   Type 2 diabetes mellitus ([place]) 05/07/2014   Bradycardia 05/07/2014   Streptococcal infection 05/07/2014   Abdominal pain 05/06/2014   HTN (hypertension), benign    Chest pain 02/05/2014   Chronic atrial fibrillation ([place]) 02/05/2014   Diabetes mellitus type 2, controlled ([place]) 02/05/2014   Pancreatitis 10/03/2013   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   Acute pancreatitis 10/03/2013   Acute on chronic combined systolic and diastolic CHF (congestive heart failure) ([place]) 07/07/2013   Morbid obesity ([place]) 07/07/2013   Chronic combined systolic and diastolic CHF (congestive heart failure) ([place]) 07/07/2013   Hyperkalemia 05/14/2013   AKI (acute kidney injury) ([place]) 05/13/2013   Pancreatitis, acute 05/10/2013   Fever 05/03/2013   Hyperosmolar non-ketotic state in patient with type 2 diabetes mellitus ([place]) 05/01/2013   Lactic acidosis 05/01/2013   Delirium, acute 05/01/2013   Chronic pancreatitis ([place]) 05/01/2013  Acute respiratory failure ([place]) 05/01/2013   Recurrent incisional hernia with obstruction 12/29/2012   Nonischemic cardiomyopathy ([place]) 07/14/2012   Atrial fibrillation ([place]) 01/14/2011   Acute on chronic combined systolic and diastolic heart failure 70/35/0093   Valvular heart disease 01/14/2011    Past Surgical History:  Procedure Laterality Date   BONE MARROW BIOPSY  02/2014   CATARACT EXTRACTION W/ INTRAOCULAR LENS  IMPLANT, BILATERAL Bilateral    CHOLECYSTECTOMY     HERNIA REPAIR     INSERTION OF MESH  10/2006   IR RADIOLOGIST EVAL &  MGMT  08/18/2017   PANCREAS SURGERY     "placed stents; multiple pseudo cysts removal"   SKIN GRAFT     TRACHEOSTOMY  2007   VENTRAL HERNIA REPAIR      Prior to Admission medications   Medication Sig Start Date End Date Taking? Authorizing Provider  APPLE CIDER VINEGAR PO Take 1 tablet by mouth 3 (three) times daily.    [provider]  aspirin 81 MG tablet Take 81 mg by mouth daily before breakfast.     [provider]  benazepril (LOTENSIN) 10 MG tablet Take 10 mg by mouth daily. Take 1/2 tablet daily by mouth    [provider]  Carboxymethylcellulose Sod PF 0.5 % SOLN Apply 1 drop to eye 5 (five) times daily. 1 drop in each eye 5 times/day    [provider]  carvedilol (COREG) 25 MG tablet Take 25 mg by mouth 2 (two) times daily with a meal. Takes 1/2 tablet by mouth bid.    [provider]  Cholecalciferol 2000 units TABS Take 1 tablet by mouth daily.    [provider]  Continuous Blood Gluc Sensor (FREESTYLE LIBRE 2 SENSOR) MISC 1 Device by Does not apply route every 14 (fourteen) days. 11/20/20   Renato Shin, MD  Cyanocobalamin (VITAMIN B 12 PO) Take 1 tablet by mouth daily.     [provider]  digoxin (LANOXIN) 0.125 MG tablet Take 1 tablet (125 mcg total) by mouth every morning. 02/12/14   Eugenie Filler, MD  escitalopram (LEXAPRO) 10 MG tablet Take 10 mg by mouth daily.    [provider]  folic acid (FOLVITE) 1 MG tablet Take 1 tablet (1 mg total) by mouth daily. 05/17/13   Thurnell Lose, MD  furosemide (LASIX) 40 MG tablet Take 1 tablet (40 mg total) by mouth 2 (two) times daily. Hold for the next 3 days, then resume on 01/12/2015 Patient taking differently: Take 40 mg by mouth daily. Hold for the next 3 days, then resume on 01/12/2015 01/12/15   Elgergawy, Silver Huguenin, MD  gabapentin (NEURONTIN) 600 MG tablet Take 600 mg by mouth 3 (three) times daily.    [provider]   HYDROcodone-acetaminophen (NORCO/VICODIN) 5-325 MG tablet Take 1 tablet by mouth every 6 (six) hours as needed for moderate pain. 01/22/16   Duffy Bruce, MD  HYDROmorphone (DILAUDID) 2 MG tablet Take 2 mg by mouth every 4 (four) hours as needed for severe pain.    [provider]  insulin aspart (NOVOLOG) 100 UNIT/ML injection Before each meal 3 times a day and then bedtime, 140-199 - 2 units, 200-250 - 4 units, 251-299 - 6 units,  300-349 - 8 units,  350 or above 10 units. Dispense syringes and needles as needed, Ok to switch to PEN if approved. Okay to switch to any approved short-acting insulin. 05/17/13   Thurnell Lose, MD  insulin glargine (LANTUS)  100 UNIT/ML injection Inject 20-40 Units into the skin at bedtime. Takes 20 - 40 units depending on blood sugar reading. Confirmed with pt.    [provider]  metoprolol succinate (TOPROL-XL) 50 MG 24 hr tablet Take 1 tablet (50 mg total) by mouth every morning. 02/12/14   Eugenie Filler, MD  Misc Natural Products [facility] COMPLEX PO) Take 1 tablet by mouth daily.    [provider]  Omega-3 1000 MG CAPS Take 1 capsule by mouth daily.    [provider]  ondansetron (ZOFRAN ODT) 4 MG disintegrating tablet Take 1 tablet (4 mg total) every 8 (eight) hours as needed by mouth for nausea or vomiting. 02/08/17   Darel Hong, MD  ondansetron (ZOFRAN) 4 MG tablet Take 1 tablet (4 mg total) by mouth every 8 (eight) hours as needed for nausea or vomiting. 07/26/14   Dahlia Bailiff, PA-C  ondansetron (ZOFRAN) 4 MG tablet Take 1 tablet (4 mg total) by mouth every 8 (eight) hours as needed for nausea or vomiting. 01/22/16   Duffy Bruce, MD  OXcarbazepine (TRILEPTAL) 150 MG tablet Take 150 mg by mouth 2 (two) times daily. Take 2 tablets by mouth 3 times daily for headaches.    [provider]  Oxycodone HCl 10 MG TABS Take 1 tablet (10 mg total) every 6 (six) hours as needed by mouth. 02/08/17   Darel Hong, MD  Pancrelipase, Lip-Prot-Amyl, 24000-76000 units CPEP Take 24,000 Units by mouth 3 (three) times daily.    [provider]  pantoprazole (PROTONIX) 40 MG tablet Take 1 tablet (40 mg total) by mouth 2 (two) times daily. 05/17/13   Thurnell Lose, MD  potassium chloride SA (K-DUR,KLOR-CON) 20 MEQ tablet Take 2 tablets (40 mEq total) by mouth daily. Patient taking differently: Take 20 mEq by mouth daily. 02/12/14   Eugenie Filler, MD  sildenafil (VIAGRA) 100 MG tablet Take 100 mg by mouth daily as needed for erectile dysfunction.    [provider]  simvastatin (ZOCOR) 80 MG tablet Take 80 mg by mouth daily.    [provider]  thiamine 100 MG tablet Take 1 tablet (100 mg total) by mouth daily. 05/17/13   Thurnell Lose, MD  triamcinolone cream (KENALOG) 0.1 % Apply 1 application topically 2 (two) times daily. Apply as directed to affected area bid.    [provider]  TURMERIC PO Take 1 tablet by mouth daily.    [provider]    Allergies Morphine and related  Family History  Problem Relation Age of Onset   Lymphoma Father    Hypertension Mother    Diabetes Mother     Social History Social History   Tobacco Use   Smoking status: Never   Smokeless tobacco: Never  Substance Use Topics   Alcohol use: No   Drug use: No    Review of Systems  Review of Systems  Constitutional:  Negative for chills and fever.  HENT:  Positive for nosebleeds. Negative for sore throat.   Respiratory:  Negative for shortness of breath.   Cardiovascular:  Negative for chest pain.  Gastrointestinal:  Negative for abdominal pain.  Genitourinary:  Negative for flank pain.  Musculoskeletal:  Negative for neck pain.  Skin:  Negative for rash and wound.  Allergic/Immunologic: Negative for immunocompromised state.  Neurological:  Negative for weakness and numbness.  Hematological:  Does not bruise/bleed easily.     ____________________________________________  PHYSICAL EXAM:      VITAL  SIGNS: ED Triage Vitals  Enc Vitals Group     BP 12/20/20 1052 (!) 191/110     Pulse Rate 12/20/20 1052 (!) 105     Resp 12/20/20 1052 20     Temp 12/20/20 1052 98 F (36.7 C)     Temp Source 12/20/20 1052 Oral     SpO2 12/20/20 1052 97 %     Weight 12/20/20 1055 287 lb 14.7 oz (130.6 kg)     Height 12/20/20 1055 6' (1.829 m)     Head Circumference --      Peak Flow --      Pain Score 12/20/20 1055 10     Pain Loc --      Pain Edu? --      Excl. in [place]? --      Physical Exam Vitals and nursing note reviewed.  Constitutional:      General: He is not in acute distress.    Appearance: He is well-developed.  HENT:     Head: Normocephalic and atraumatic.     Comments: Large clot noted throughout the left nasal cavity, expressed with sneezing.  Patient noted to have exposed, friable, bleeding vessel with venous oozing in the left anterior nasopharynx.  Right nasopharynx with small amount of blood.  No active bleeding.  No posterior pharyngeal erythema or swelling or bleeding. Eyes:     Conjunctiva/sclera: Conjunctivae normal.  Cardiovascular:     Rate and Rhythm: Normal rate and regular rhythm.     Heart sounds: Normal heart sounds.  Pulmonary:     Effort: Pulmonary effort is normal. No respiratory distress.     Breath sounds: No wheezing.  Abdominal:     General: There is no distension.  Musculoskeletal:     Cervical back: Neck supple.  Skin:    General: Skin is warm.     Capillary Refill: Capillary refill takes less than 2 seconds.     Findings: No rash.  Neurological:     Mental Status: He is alert and oriented to person, place, and time.     Motor: No abnormal muscle tone.      ____________________________________________   LABS (all labs ordered are listed, but only abnormal results are displayed)  Labs Reviewed  BASIC METABOLIC PANEL - Abnormal; Notable for the following components:       Result Value   Chloride 97 (*)    Glucose, Bld 314 (*)    Calcium 8.8 (*)    All other components within normal limits  CBC    ____________________________________________  EKG:  ________________________________________  RADIOLOGY All imaging, including plain films, CT scans, and ultrasounds, independently reviewed by me, and interpretations confirmed via formal radiology reads.  ED MD interpretation:     Official radiology report(s): No results found.  ____________________________________________  PROCEDURES   Procedure(s) performed (including Critical Care):  .Epistaxis Management  Date/Time: 12/20/2020 6:28 PM Performed by: Duffy Bruce, MD Authorized by: Duffy Bruce, MD   Consent:    Consent obtained:  Verbal   Consent given by:  Patient   Risks, benefits, and alternatives were discussed: yes     Risks discussed:  Pain, bleeding, infection and nasal injury   Alternatives discussed:  Alternative treatment Anesthesia:    Anesthesia method:  Topical application   Topical anesthetic:  Lidocaine gel Procedure details:    Treatment site:  L anterior   Treatment method:  Silver nitrate   Treatment complexity:  Limited   Treatment episode: initial   Post-procedure  details:    Assessment:  Bleeding stopped   Procedure completion:  Tolerated Comments:     Patient initially tolerated well, with complete hemostasis. He began to complain of nasal pain and headache shortly thereafter, possibly due to the exposed cauterized mucosa. Septum is pink, patent, and no signs of ischemia noted. No ongoing bleeding. Only small area of silver nitrate was applied. Topical lidocaine soaked gauze used with relief. Will d/c with lido gel and instructions to moisturize area.   ____________________________________________  INITIAL IMPRESSION / MDM / [place] / ED COURSE  As part of my medical decision making, I reviewed the following data within the [facility] notes reviewed and incorporated, Old chart reviewed, Notes from prior ED visits, and [place] Controlled Substance Database       *Klye Besecker was evaluated in Emergency Department on 12/20/2020 for the symptoms described in the history of present illness. He was evaluated in the context of the global COVID-19 pandemic, which necessitated consideration that the patient might be at risk for infection with the SARS-CoV-2 virus that causes COVID-19. Institutional protocols and algorithms that pertain to the evaluation of patients at risk for COVID-19 are in a state of rapid change based on information released by regulatory bodies including the CDC and federal and state organizations. These policies and algorithms were followed during the patient's care in the ED.  Some ED evaluations and interventions may be delayed as a result of limited staffing during the pandemic.*     Medical Decision Making: Pleasant 65 year old male here with left-sided anterior epistaxis in the setting of nasal cannula use and likely exposure to dry, hot air from his home heating system.  After discussion of management options, given that he uses a nasal cannula, decision made to cauterize the exposed and bleeding vessel to prevent further rebleeds at home.  He does wear oxygen, so felt packing would not be ideal, and patient had significant difficulty tolerating silver nitrate, so do not feel he would tolerate this.  Prior to silver nitrate application, Afrin was used as well as topical lidocaine throughout the entire nasopharynx.  The patient did complain of pain and left-sided headache after the silver nitrate, with sensitivity to breathing.  A topical lidocaine gauze was used with improvement in pain.  He was complaining of headache but this seems to all related to his left nostril status post silver nitrate exposure.  The right side was fully visualized and septum is pink, well perfused.  No evidence of ongoing  cauterization or reaction.  Will discharge with topical lidocaine as needed, outpatient follow-up and instructions to attempt to moisturize nasopharynx as well as avoid direct contact with nasal cannula.  ____________________________________________  FINAL CLINICAL IMPRESSION(S) / ED DIAGNOSES  Final diagnoses:  Epistaxis     MEDICATIONS GIVEN DURING THIS VISIT:  Medications  lidocaine (XYLOCAINE) 2 % jelly 1 application (1 application Topical Given 12/20/20 1223)  silver nitrate applicators applicator 2 Stick (2 Sticks Topical Given 12/20/20 1223)  HYDROmorphone (DILAUDID) injection 1 mg (1 mg Intramuscular Given 12/20/20 1222)  ondansetron (ZOFRAN-ODT) disintegrating tablet 4 mg (4 mg Oral Given 12/20/20 1221)  oxymetazoline (AFRIN) 0.05 % nasal spray 1 spray (1 spray Each Nare Given 12/20/20 1223)  HYDROmorphone (DILAUDID) injection 1 mg (1 mg Intramuscular Given 12/20/20 1312)  lidocaine (XYLOCAINE) 2 % jelly 1 application (1 application Topical Given 12/20/20 1408)     ED Discharge Orders     None  Note:  This document was prepared using Dragon voice recognition software and may include unintentional dictation errors.   Duffy Bruce, MD 12/20/20 [phone]

## 2020-12-20 NOTE — ED Triage Notes (Signed)
Pt comes into the ED via EMS from home with epitaxis, pt was given 2 doses of afrin and has been hold pressure, pt now has some blood coming from the left eye. Pt is not on any blood thinners.

## 2020-12-20 NOTE — ED Notes (Signed)
Patient's O2 dropping into mid 80's. Patient refusing O2 at this time. [place], MD aware.

## 2021-01-16 ENCOUNTER — Ambulatory Visit (INDEPENDENT_AMBULATORY_CARE_PROVIDER_SITE_OTHER): Payer: No Typology Code available for payment source | Admitting: Endocrinology

## 2021-01-16 ENCOUNTER — Encounter: Payer: Self-pay | Admitting: Endocrinology

## 2021-01-16 ENCOUNTER — Other Ambulatory Visit: Payer: Self-pay

## 2021-01-16 VITALS — BP 118/54 | HR 94 | Ht 72.0 in

## 2021-01-16 DIAGNOSIS — Z794 Long term (current) use of insulin: Secondary | ICD-10-CM | POA: Diagnosis not present

## 2021-01-16 DIAGNOSIS — E1142 Type 2 diabetes mellitus with diabetic polyneuropathy: Secondary | ICD-10-CM | POA: Diagnosis not present

## 2021-01-16 LAB — POCT GLYCOSYLATED HEMOGLOBIN (HGB A1C): Hemoglobin A1C: 10.6 % — AB (ref 4.0–5.6)

## 2021-01-16 MED ORDER — NOVOLIN N FLEXPEN 100 UNIT/ML ~~LOC~~ SUPN: 75.0000 [IU] | PEN_INJECTOR | SUBCUTANEOUS | 11 refills | Status: DC

## 2021-01-16 NOTE — Progress Notes (Signed)
Subjective:    Patient ID: Don Brown, male    DOB: [birth year], 65 y.o.   MRN: [id number]  HPI Pt returns for f/u of diabetes mellitus:  DM type: Insulin-requiring type 2 Dx'ed: uncertain date (many years ago) Complications: PN Therapy: insulin since 2015 DKA: never Severe hypoglycemia: never Pancreatitis: never Pancreatic imaging: CT (2019): chronic pancreatitis.  SDOH: pt attributes DM and general deterioration in his health to removal of pancreatic stent; 2 children help pt with DM care; he is chronically dissatisfied with his health care in general.   Other: he takes multiple daily injections.   Interval history: pt again expresses his dissatisfaction with various health care providers.  I reviewed continuous glucose monitor data.  Glucose varies from 100-400.  It is in general lowest at 4-5AM, and highest at 6-9PM.  He takes Lantus 46 units qd, and PRN Novolog (averages a total of approx 30 units per day) Past Medical History:  Diagnosis Date   Anemia    Anxiety    Atrial fibrillation (HCC)    Cervical compression fracture (HCC)    Cervical disk disease requiring surgery   CHF (congestive heart failure) (HCC)    Chronic pain    Chronic systolic heart failure (HCC)    LVEF 40-45%   Degenerative joint disease    Depression    Essential hypertension, benign    Headache    "stress"   Heart murmur    History of blood transfusion 2007   "related to OR"   History of renal failure    Requiring hemodialysis in the past with normal renal function at this point   History of ventral hernia repair    Hyperlipidemia    MGUS (monoclonal gammopathy of unknown significance)    "or myelona" (05/07/2014)   Mitral valve prolapse    Morbid obesity ([place])    Myeloma ([place])    "or MGUS" (05/07/2014)   Narcotic dependence, episodic use ([place])    On home oxygen therapy    "2L prn" (05/07/2014)   Pancreatitis    Severe pancreatitis status post surgery with 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 status post multiple endoscopic drainage procedures and stents    Respiratory failure ([place])    History of ventilatory-dependent respiratory failure and tracheostomy    Sleep apnea    "don't wear mask" (05/07/2014)   Type 2 diabetes mellitus ([place])     Past Surgical History:  Procedure Laterality Date   BONE MARROW BIOPSY  02/2014   CATARACT EXTRACTION W/ INTRAOCULAR LENS  IMPLANT, BILATERAL Bilateral    CHOLECYSTECTOMY     HERNIA REPAIR     INSERTION OF MESH  10/2006   IR RADIOLOGIST EVAL & MGMT  08/18/2017   PANCREAS SURGERY     "placed stents; multiple pseudo cysts removal"   SKIN GRAFT     TRACHEOSTOMY  2007   VENTRAL HERNIA REPAIR      Social History   Socioeconomic History   Marital status: Single    Spouse name: Not on file   Number of children: Not on file   Years of education: Not on file   Highest education level: Not on file  Occupational History   Not on file  Tobacco Use   Smoking status: Never   Smokeless tobacco: Never  Substance and Sexual Activity   Alcohol use: No   Drug use: No   Sexual activity: Not Currently  Other Topics Concern   Not on file  Social History Narrative  Not on file   Social Determinants of Health   Financial Resource Strain: Not on file  Food Insecurity: Not on file  Transportation Needs: Not on file  Physical Activity: Not on file  Stress: Not on file  Social Connections: Not on file  Intimate Partner Violence: Not on file    Current Outpatient Medications on File Prior to Visit  Medication Sig Dispense Refill   APPLE CIDER VINEGAR PO Take 1 tablet by mouth 3 (three) times daily.     aspirin 81 MG tablet Take 81 mg by mouth daily before breakfast.      benazepril (LOTENSIN) 10 MG tablet Take 10 mg by mouth daily. Take 1/2 tablet daily by mouth     Carboxymethylcellulose Sod PF 0.5 % SOLN Apply 1 drop to eye 5 (five) times daily. 1 drop in each eye 5 times/day     carvedilol (COREG) 25 MG tablet Take  25 mg by mouth 2 (two) times daily with a meal. Takes 1/2 tablet by mouth bid.     Cholecalciferol 2000 units TABS Take 1 tablet by mouth daily.     Continuous Blood Gluc Sensor (FREESTYLE LIBRE 2 SENSOR) MISC 1 Device by Does not apply route every 14 (fourteen) days. 6 each 3   Cyanocobalamin (VITAMIN B 12 PO) Take 1 tablet by mouth daily.      digoxin (LANOXIN) 0.125 MG tablet Take 1 tablet (125 mcg total) by mouth every morning. 31 tablet 0   escitalopram (LEXAPRO) 10 MG tablet Take 10 mg by mouth daily.     folic acid (FOLVITE) 1 MG tablet Take 1 tablet (1 mg total) by mouth daily. 30 tablet 0   furosemide (LASIX) 40 MG tablet Take 1 tablet (40 mg total) by mouth 2 (two) times daily. Hold for the next 3 days, then resume on 01/12/2015 (Patient taking differently: Take 40 mg by mouth daily. Hold for the next 3 days, then resume on 01/12/2015) 30 tablet    gabapentin (NEURONTIN) 600 MG tablet Take 600 mg by mouth 3 (three) times daily.     HYDROcodone-acetaminophen (NORCO/VICODIN) 5-325 MG tablet Take 1 tablet by mouth every 6 (six) hours as needed for moderate pain. 10 tablet 0   HYDROmorphone (DILAUDID) 2 MG tablet Take 2 mg by mouth every 4 (four) hours as needed for severe pain.     metoprolol succinate (TOPROL-XL) 50 MG 24 hr tablet Take 1 tablet (50 mg total) by mouth every morning. 30 tablet 0   Misc Natural Products (GINSENG COMPLEX PO) Take 1 tablet by mouth daily.     Omega-3 1000 MG CAPS Take 1 capsule by mouth daily.     ondansetron (ZOFRAN ODT) 4 MG disintegrating tablet Take 1 tablet (4 mg total) every 8 (eight) hours as needed by mouth for nausea or vomiting. 20 tablet 0   ondansetron (ZOFRAN) 4 MG tablet Take 1 tablet (4 mg total) by mouth every 8 (eight) hours as needed for nausea or vomiting. 12 tablet 0   ondansetron (ZOFRAN) 4 MG tablet Take 1 tablet (4 mg total) by mouth every 8 (eight) hours as needed for nausea or vomiting. 12 tablet 0   OXcarbazepine (TRILEPTAL) 150 MG  tablet Take 150 mg by mouth 2 (two) times daily. Take 2 tablets by mouth 3 times daily for headaches.     Oxycodone HCl 10 MG TABS Take 1 tablet (10 mg total) every 6 (six) hours as needed by mouth. 9 tablet 0   Pancrelipase, Lip-Prot-Amyl,  24000-76000 units CPEP Take 24,000 Units by mouth 3 (three) times daily.     pantoprazole (PROTONIX) 40 MG tablet Take 1 tablet (40 mg total) by mouth 2 (two) times daily. 60 tablet 0   potassium chloride SA (K-DUR,KLOR-CON) 20 MEQ tablet Take 2 tablets (40 mEq total) by mouth daily. (Patient taking differently: Take 20 mEq by mouth daily.) 62 tablet 0   sildenafil (VIAGRA) 100 MG tablet Take 100 mg by mouth daily as needed for erectile dysfunction.     simvastatin (ZOCOR) 80 MG tablet Take 80 mg by mouth daily.     thiamine 100 MG tablet Take 1 tablet (100 mg total) by mouth daily. 30 tablet 0   triamcinolone cream (KENALOG) 0.1 % Apply 1 application topically 2 (two) times daily. Apply as directed to affected area bid.     TURMERIC PO Take 1 tablet by mouth daily.     No current facility-administered medications on file prior to visit.    Allergies  Allergen Reactions   Morphine And Related Nausea And Vomiting    Family History  Problem Relation Age of Onset   Lymphoma Father    Hypertension Mother    Diabetes Mother     BP (!) 118/54 (BP Location: Right Arm, Patient Position: Sitting, Cuff Size: Large)   Pulse 94   Ht 6' (1.829 m)   SpO2 97%   BMI 39.05 kg/m    Review of Systems     Objective:   Physical Exam    A1c=10.6%  Lab Results  Component Value Date   CREATININE 0.77 12/20/2020   BUN 16 12/20/2020   NA 136 12/20/2020   K 4.0 12/20/2020   CL 97 (L) 12/20/2020   CO2 30 12/20/2020      Assessment & Plan:  Insulin-requiring type 2 DM: uncontrolled.  We discussed.  We decided to go with just 1 insulin.   Patient Instructions  check your blood sugar twice a day.  vary the time of day when you check, between before the  3 meals, and at bedtime.  also check if you have symptoms of your blood sugar being too high or too low.  please keep a record of the readings and bring it to your next appointment here (or you can bring the meter itself).  You can write it on any piece of paper.  please call us sooner if your blood sugar goes below 70, or if most of your readings are over 200. I have sent a prescription to your pharmacy, to change both current insulins to NPH each morning.    Please come back for a follow-up appointment in 1 month.   We'll download the continuous glucose monitor data then.

## 2021-01-16 NOTE — Patient Instructions (Addendum)
check your blood sugar twice a day.  vary the time of day when you check, between before the 3 meals, and at bedtime.  also check if you have symptoms of your blood sugar being too high or too low.  please keep a record of the readings and bring it to your next appointment here (or you can bring the meter itself).  You can write it on any piece of paper.  please call us sooner if your blood sugar goes below 70, or if most of your readings are over 200. I have sent a prescription to your pharmacy, to change both current insulins to NPH each morning.    Please come back for a follow-up appointment in 1 month.   We'll download the continuous glucose monitor data then.

## 2021-02-20 ENCOUNTER — Other Ambulatory Visit: Payer: Self-pay

## 2021-02-20 ENCOUNTER — Telehealth: Payer: Self-pay

## 2021-02-20 ENCOUNTER — Ambulatory Visit (INDEPENDENT_AMBULATORY_CARE_PROVIDER_SITE_OTHER): Payer: No Typology Code available for payment source | Admitting: Endocrinology

## 2021-02-20 VITALS — BP 118/90 | HR 101 | Ht 72.0 in | Wt 285.4 lb

## 2021-02-20 DIAGNOSIS — Z794 Long term (current) use of insulin: Secondary | ICD-10-CM | POA: Diagnosis not present

## 2021-02-20 DIAGNOSIS — E1142 Type 2 diabetes mellitus with diabetic polyneuropathy: Secondary | ICD-10-CM | POA: Diagnosis not present

## 2021-02-20 MED ORDER — HUMULIN N KWIKPEN 100 UNIT/ML ~~LOC~~ SUPN: 75.0000 [IU] | PEN_INJECTOR | SUBCUTANEOUS | 3 refills | Status: DC

## 2021-02-20 MED ORDER — FREESTYLE LIBRE 2 SENSOR MISC: 1.0000 | 3 refills | Status: AC

## 2021-02-20 MED ORDER — FREESTYLE LIBRE 2 SENSOR MISC: 1.0000 | 3 refills | Status: DC

## 2021-02-20 NOTE — Patient Instructions (Addendum)
check your blood sugar twice a day.  vary the time of day when you check, between before the 3 meals, and at bedtime.  also check if you have symptoms of your blood sugar being too high or too low.  please keep a record of the readings and bring it to your next appointment here (or you can bring the meter itself).  You can write it on any piece of paper.  please call us sooner if your blood sugar goes below 70, or if most of your readings are over 200. I have again sent a prescription to your pharmacy, to change both current insulins to NPH each morning, in pens.     Please come back for a follow-up appointment in 6 weeks.

## 2021-02-20 NOTE — Telephone Encounter (Signed)
Spoke with Jesse(Pharmacy Tech) at [facility] in [place] and wanted to know if you could switch the Novolin N to lantus pen form bc that's all they have.  [phone]

## 2021-02-20 NOTE — Progress Notes (Signed)
Subjective:    Patient ID: Don Brown, male    DOB: 09-12-1955, 65 y.o.   MRN: [id number]  HPI Pt returns for f/u of diabetes mellitus:  DM type: Insulin-requiring type 2 Dx'ed: uncertain date (many years ago) Complications: PN Therapy: insulin since 2015 DKA: never Severe hypoglycemia: never Pancreatitis: never Pancreatic imaging: CT (2019): chronic pancreatitis.  SDOH: pt attributes DM and general deterioration in his health to removal of pancreatic stent; 2 children help pt with DM care; he is chronically dissatisfied with his health care in general.   Other: he is change to QAM insulin, after poor results with multiple daily injections.   Interval history: I reviewed continuous glucose monitor data.  Glucose varies from 100-400.  It is in general lowest at 4-8AM, and highest at 9PM.  He still takes Lantus 46 units qd, and PRN Novolog.  He has not changed to NPH, because [facility] vials which he says he cannot use, due to visual loss.   Past Medical History:  Diagnosis Date   Anemia    Anxiety    Atrial fibrillation (HCC)    Cervical compression fracture (HCC)    Cervical disk disease requiring surgery   CHF (congestive heart failure) (HCC)    Chronic pain    Chronic systolic heart failure (HCC)    LVEF 40-45%   Degenerative joint disease    Depression    Essential hypertension, benign    Headache    "stress"   Heart murmur    History of blood transfusion 2007   "related to OR"   History of renal failure    Requiring hemodialysis in the past with normal renal function at this point   History of ventral hernia repair    Hyperlipidemia    MGUS (monoclonal gammopathy of unknown significance)    "or myelona" (05/07/2014)   Mitral valve prolapse    Morbid obesity ([place])    Myeloma ([place])    "or MGUS" (05/07/2014)   Narcotic dependence, episodic use ([place])    On home oxygen therapy    "2L prn" (05/07/2014)   Pancreatitis    Severe pancreatitis status post surgery with  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)    History of ventilatory-dependent respiratory failure and tracheostomy    Sleep apnea    "don't wear mask" (05/07/2014)   Type 2 diabetes mellitus ([place])     Past Surgical History:  Procedure Laterality Date   BONE MARROW BIOPSY  02/2014   CATARACT EXTRACTION W/ INTRAOCULAR LENS  IMPLANT, BILATERAL Bilateral    CHOLECYSTECTOMY     HERNIA REPAIR     INSERTION OF MESH  10/2006   IR RADIOLOGIST EVAL & MGMT  08/18/2017   PANCREAS SURGERY     "placed stents; multiple pseudo cysts removal"   SKIN GRAFT     TRACHEOSTOMY  2007   VENTRAL HERNIA REPAIR      Social History   Socioeconomic History   Marital status: Single    Spouse name: Not on file   Number of children: Not on file   Years of education: Not on file   Highest education level: Not on file  Occupational History   Not on file  Tobacco Use   Smoking status: Never   Smokeless tobacco: Never  Substance and Sexual Activity   Alcohol use: No   Drug use: No   Sexual activity: Not Currently  Other Topics Concern   Not on file  Social History Narrative   Not on file   Social Determinants of Health   Financial Resource Strain: Not on file  Food Insecurity: Not on file  Transportation Needs: Not on file  Physical Activity: Not on file  Stress: Not on file  Social Connections: Not on file  Intimate Partner Violence: Not on file    Current Outpatient Medications on File Prior to Visit  Medication Sig Dispense Refill   APPLE CIDER VINEGAR PO Take 1 tablet by mouth 3 (three) times daily.     aspirin 81 MG tablet Take 81 mg by mouth daily before breakfast.      benazepril (LOTENSIN) 10 MG tablet Take 10 mg by mouth daily. Take 1/2 tablet daily by mouth     Carboxymethylcellulose Sod PF 0.5 % SOLN Apply 1 drop to eye 5 (five) times daily. 1 drop in each eye 5 times/day     carvedilol  (COREG) 25 MG tablet Take 25 mg by mouth 2 (two) times daily with a meal. Takes 1/2 tablet by mouth bid.     Cholecalciferol 2000 units TABS Take 1 tablet by mouth daily.     Cyanocobalamin (VITAMIN B 12 PO) Take 1 tablet by mouth daily.      digoxin (LANOXIN) 0.125 MG tablet Take 1 tablet (125 mcg total) by mouth every morning. 31 tablet 0   escitalopram (LEXAPRO) 10 MG tablet Take 10 mg by mouth daily.     folic acid (FOLVITE) 1 MG tablet Take 1 tablet (1 mg total) by mouth daily. 30 tablet 0   furosemide (LASIX) 40 MG tablet Take 1 tablet (40 mg total) by mouth 2 (two) times daily. Hold for the next 3 days, then resume on 01/12/2015 (Patient taking differently: Take 40 mg by mouth daily. Hold for the next 3 days, then resume on 01/12/2015) 30 tablet    gabapentin (NEURONTIN) 600 MG tablet Take 600 mg by mouth 3 (three) times daily.     HYDROcodone-acetaminophen (NORCO/VICODIN) 5-325 MG tablet Take 1 tablet by mouth every 6 (six) hours as needed for moderate pain. 10 tablet 0   HYDROmorphone (DILAUDID) 2 MG tablet Take 2 mg by mouth every 4 (four) hours as needed for severe pain.     metoprolol succinate (TOPROL-XL) 50 MG 24 hr tablet Take 1 tablet (50 mg total) by mouth every morning. 30 tablet 0   Misc Natural Products (GINSENG COMPLEX PO) Take 1 tablet by mouth daily.     Omega-3 1000 MG CAPS Take 1 capsule by mouth daily.     ondansetron (ZOFRAN ODT) 4 MG disintegrating tablet Take 1 tablet (4 mg total) every 8 (eight) hours as needed by mouth for nausea or vomiting. 20 tablet 0   ondansetron (ZOFRAN) 4 MG tablet Take 1 tablet (4 mg total) by mouth every 8 (eight) hours as needed for nausea or vomiting. 12 tablet 0   ondansetron (ZOFRAN) 4 MG tablet Take 1 tablet (4 mg total) by mouth every 8 (eight) hours as needed for nausea or vomiting. 12 tablet 0   OXcarbazepine (TRILEPTAL) 150 MG tablet Take 150 mg by mouth 2 (two) times daily. Take 2 tablets by mouth 3 times daily for headaches.      Oxycodone HCl 10 MG TABS Take 1 tablet (10 mg total) every 6 (six) hours as needed by mouth. 9 tablet 0   Pancrelipase, Lip-Prot-Amyl, 24000-76000 units CPEP Take 24,000 Units by mouth 3 (three) times  daily.     pantoprazole (PROTONIX) 40 MG tablet Take 1 tablet (40 mg total) by mouth 2 (two) times daily. 60 tablet 0   potassium chloride SA (K-DUR,KLOR-CON) 20 MEQ tablet Take 2 tablets (40 mEq total) by mouth daily. (Patient taking differently: Take 20 mEq by mouth daily.) 62 tablet 0   sildenafil (VIAGRA) 100 MG tablet Take 100 mg by mouth daily as needed for erectile dysfunction.     simvastatin (ZOCOR) 80 MG tablet Take 80 mg by mouth daily.     thiamine 100 MG tablet Take 1 tablet (100 mg total) by mouth daily. 30 tablet 0   triamcinolone cream (KENALOG) 0.1 % Apply 1 application topically 2 (two) times daily. Apply as directed to affected area bid.     TURMERIC PO Take 1 tablet by mouth daily.     No current facility-administered medications on file prior to visit.    Allergies  Allergen Reactions   Morphine And Related Nausea And Vomiting    Family History  Problem Relation Age of Onset   Lymphoma Father    Hypertension Mother    Diabetes Mother     BP 118/90 (BP Location: Left Arm, Patient Position: Sitting, Cuff Size: Large)   Pulse (!) 101   Ht 6' (1.829 m)   Wt 285 lb 6.4 oz (129.5 kg)   BMI 38.71 kg/m   Review of Systems     Objective:   Physical Exam   Lab Results  Component Value Date   HGBA1C 10.6 (A) 01/16/2021      Assessment & Plan:  Insulin-requiring type 2 DM: uncontrolled.   Patient Instructions  check your blood sugar twice a day.  vary the time of day when you check, between before the 3 meals, and at bedtime.  also check if you have symptoms of your blood sugar being too high or too low.  please keep a record of the readings and bring it to your next appointment here (or you can bring the meter itself).  You can write it on any piece of paper.   please call us sooner if your blood sugar goes below 70, or if most of your readings are over 200. I have again sent a prescription to your pharmacy, to change both current insulins to NPH each morning, in pens.     Please come back for a follow-up appointment in 6 weeks.

## 2021-02-21 NOTE — Telephone Encounter (Signed)
Spoke with Cristie Hem with the [facility] in [place] to see whether they carry Humulin N pens so Loanne Drilling can switch pt meds.  Waiting on a return call from clinic

## 2021-04-04 ENCOUNTER — Ambulatory Visit: Payer: No Typology Code available for payment source | Admitting: Endocrinology

## 2021-05-06 ENCOUNTER — Other Ambulatory Visit: Payer: Self-pay

## 2021-05-06 ENCOUNTER — Ambulatory Visit (INDEPENDENT_AMBULATORY_CARE_PROVIDER_SITE_OTHER): Payer: No Typology Code available for payment source | Admitting: Endocrinology

## 2021-05-06 ENCOUNTER — Encounter: Payer: Self-pay | Admitting: Endocrinology

## 2021-05-06 VITALS — BP 148/86 | HR 107 | Wt 272.4 lb

## 2021-05-06 DIAGNOSIS — Z794 Long term (current) use of insulin: Secondary | ICD-10-CM | POA: Diagnosis not present

## 2021-05-06 DIAGNOSIS — E1142 Type 2 diabetes mellitus with diabetic polyneuropathy: Secondary | ICD-10-CM

## 2021-05-06 LAB — POCT GLYCOSYLATED HEMOGLOBIN (HGB A1C): Hemoglobin A1C: 9.1 % — AB (ref 4.0–5.6)

## 2021-05-06 MED ORDER — HUMULIN N KWIKPEN 100 UNIT/ML ~~LOC~~ SUPN: 75.0000 [IU] | PEN_INJECTOR | SUBCUTANEOUS | 3 refills | Status: DC

## 2021-05-06 NOTE — Patient Instructions (Addendum)
check your blood sugar twice a day.  vary the time of day when you check, between before the 3 meals, and at bedtime.  also check if you have symptoms of your blood sugar being too high or too low.  please keep a record of the readings and bring it to your next appointment here (or you can bring the meter itself).  You can write it on any piece of paper.  please call us sooner if your blood sugar goes below 70, or if most of your readings are over 200.   I have again sent a prescription to your pharmacy, to change both current insulins to NPH each morning, in pens.   I told them that you need this in pens, due to pt's reported visual loss.   If this doe not work, you should talk to your doctor at the [place], and the problem obtaining the NPH insulin in pens.  Please come back for a follow-up appointment in 2 months.

## 2021-05-06 NOTE — Progress Notes (Signed)
Subjective:    Patient ID: Don Brown, male    DOB: 02-05-1956, 66 y.o.   MRN: [id number]  HPI Pt returns for f/u of diabetes mellitus:  DM type: Insulin-requiring type 2 Dx'ed: uncertain date (many years ago) Complications: PN Therapy: insulin since 2015 DKA: never Severe hypoglycemia: never Pancreatitis: never Pancreatic imaging: CT (2019): chronic pancreatitis.  SDOH: pt attributes DM and general deterioration in his health to removal of pancreatic stent; 2 children help pt with DM care; he is chronically dissatisfied with his health care in general; he says the [place] has classified him as a "disruptive individual."   Other: he is change to QAM insulin, after poor results with multiple daily injections.   Interval history: I reviewed continuous glucose monitor data.  Glucose varies from 170-400.  It is in general lowest at 6AM, and highest at 9PM.  It increases throughout the day, and decreases overnight.  He still takes Lantus 46 units qd, and PRN Novolog.  He still has not changed to NPH, because [facility] vials which he says he cannot use, due to visual loss.  I told pt that someone could draw up 1 week of NPH insulin for his, but pt says his son's vision is too poor to help him.   Past Medical History:  Diagnosis Date   Anemia    Anxiety    Atrial fibrillation (HCC)    Cervical compression fracture (HCC)    Cervical disk disease requiring surgery   CHF (congestive heart failure) (HCC)    Chronic pain    Chronic systolic heart failure (HCC)    LVEF 40-45%   Degenerative joint disease    Depression    Essential hypertension, benign    Headache    "stress"   Heart murmur    History of blood transfusion 2007   "related to OR"   History of renal failure    Requiring hemodialysis in the past with normal renal function at this point   History of ventral hernia repair    Hyperlipidemia    MGUS (monoclonal gammopathy of unknown significance)    "or myelona" (05/07/2014)    Mitral valve prolapse    Morbid obesity ([place])    Myeloma ([place])    "or MGUS" (05/07/2014)   Narcotic dependence, episodic use ([place])    On home oxygen therapy    "2L prn" (05/07/2014)   Pancreatitis    Severe pancreatitis status post surgery with 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)    History of ventilatory-dependent respiratory failure and tracheostomy    Sleep apnea    "don't wear mask" (05/07/2014)   Type 2 diabetes mellitus ([place])     Past Surgical History:  Procedure Laterality Date   BONE MARROW BIOPSY  02/2014   CATARACT EXTRACTION W/ INTRAOCULAR LENS  IMPLANT, BILATERAL Bilateral    CHOLECYSTECTOMY     HERNIA REPAIR     INSERTION OF MESH  10/2006   IR RADIOLOGIST EVAL & MGMT  08/18/2017   PANCREAS SURGERY     "placed stents; multiple pseudo cysts removal"   SKIN GRAFT     TRACHEOSTOMY  2007   VENTRAL HERNIA REPAIR      Social History   Socioeconomic History   Marital status: Single    Spouse name: Not on file   Number of children: Not on file   Years of education: Not on file  Highest education level: Not on file  Occupational History   Not on file  Tobacco Use   Smoking status: Never   Smokeless tobacco: Never  Substance and Sexual Activity   Alcohol use: No   Drug use: No   Sexual activity: Not Currently  Other Topics Concern   Not on file  Social History Narrative   Not on file   Social Determinants of Health   Financial Resource Strain: Not on file  Food Insecurity: Not on file  Transportation Needs: Not on file  Physical Activity: Not on file  Stress: Not on file  Social Connections: Not on file  Intimate Partner Violence: Not on file    Current Outpatient Medications on File Prior to Visit  Medication Sig Dispense Refill   APPLE CIDER VINEGAR PO Take 1 tablet by mouth 3 (three) times daily.     aspirin 81 MG tablet Take 81 mg by mouth daily  before breakfast.      benazepril (LOTENSIN) 10 MG tablet Take 10 mg by mouth daily. Take 1/2 tablet daily by mouth     Carboxymethylcellulose Sod PF 0.5 % SOLN Apply 1 drop to eye 5 (five) times daily. 1 drop in each eye 5 times/day     carvedilol (COREG) 25 MG tablet Take 25 mg by mouth 2 (two) times daily with a meal. Takes 1/2 tablet by mouth bid.     Cholecalciferol 2000 units TABS Take 1 tablet by mouth daily.     Continuous Blood Gluc Sensor (FREESTYLE LIBRE 2 SENSOR) MISC 1 Device by Does not apply route every 14 (fourteen) days. 6 each 3   Cyanocobalamin (VITAMIN B 12 PO) Take 1 tablet by mouth daily.      digoxin (LANOXIN) 0.125 MG tablet Take 1 tablet (125 mcg total) by mouth every morning. 31 tablet 0   escitalopram (LEXAPRO) 10 MG tablet Take 10 mg by mouth daily.     folic acid (FOLVITE) 1 MG tablet Take 1 tablet (1 mg total) by mouth daily. 30 tablet 0   furosemide (LASIX) 40 MG tablet Take 1 tablet (40 mg total) by mouth 2 (two) times daily. Hold for the next 3 days, then resume on 01/12/2015 (Patient taking differently: Take 40 mg by mouth daily. Hold for the next 3 days, then resume on 01/12/2015) 30 tablet    gabapentin (NEURONTIN) 600 MG tablet Take 600 mg by mouth 3 (three) times daily.     HYDROcodone-acetaminophen (NORCO/VICODIN) 5-325 MG tablet Take 1 tablet by mouth every 6 (six) hours as needed for moderate pain. 10 tablet 0   HYDROmorphone (DILAUDID) 2 MG tablet Take 2 mg by mouth every 4 (four) hours as needed for severe pain.     metoprolol succinate (TOPROL-XL) 50 MG 24 hr tablet Take 1 tablet (50 mg total) by mouth every morning. 30 tablet 0   Misc Natural Products (GINSENG COMPLEX PO) Take 1 tablet by mouth daily.     Omega-3 1000 MG CAPS Take 1 capsule by mouth daily.     ondansetron (ZOFRAN ODT) 4 MG disintegrating tablet Take 1 tablet (4 mg total) every 8 (eight) hours as needed by mouth for nausea or vomiting. 20 tablet 0   ondansetron (ZOFRAN) 4 MG tablet Take 1  tablet (4 mg total) by mouth every 8 (eight) hours as needed for nausea or vomiting. 12 tablet 0   ondansetron (ZOFRAN) 4 MG tablet Take 1 tablet (4 mg total) by mouth every 8 (eight) hours as needed  for nausea or vomiting. 12 tablet 0   OXcarbazepine (TRILEPTAL) 150 MG tablet Take 150 mg by mouth 2 (two) times daily. Take 2 tablets by mouth 3 times daily for headaches.     Oxycodone HCl 10 MG TABS Take 1 tablet (10 mg total) every 6 (six) hours as needed by mouth. 9 tablet 0   Pancrelipase, Lip-Prot-Amyl, 24000-76000 units CPEP Take 24,000 Units by mouth 3 (three) times daily.     pantoprazole (PROTONIX) 40 MG tablet Take 1 tablet (40 mg total) by mouth 2 (two) times daily. 60 tablet 0   potassium chloride SA (K-DUR,KLOR-CON) 20 MEQ tablet Take 2 tablets (40 mEq total) by mouth daily. (Patient taking differently: Take 20 mEq by mouth daily.) 62 tablet 0   sildenafil (VIAGRA) 100 MG tablet Take 100 mg by mouth daily as needed for erectile dysfunction.     simvastatin (ZOCOR) 80 MG tablet Take 80 mg by mouth daily.     thiamine 100 MG tablet Take 1 tablet (100 mg total) by mouth daily. 30 tablet 0   triamcinolone cream (KENALOG) 0.1 % Apply 1 application topically 2 (two) times daily. Apply as directed to affected area bid.     TURMERIC PO Take 1 tablet by mouth daily.     No current facility-administered medications on file prior to visit.    Allergies  Allergen Reactions   Morphine And Related Nausea And Vomiting    Family History  Problem Relation Age of Onset   Lymphoma Father    Hypertension Mother    Diabetes Mother     BP (!) 148/86 (BP Location: Left Arm, Patient Position: Sitting, Cuff Size: Normal)    Pulse (!) 107    Wt 272 lb 6.4 oz (123.6 kg)    SpO2 94%    BMI 36.94 kg/m    Review of Systems     Objective:   Physical Exam  Lab Results  Component Value Date   CREATININE 0.77 12/20/2020   BUN 16 12/20/2020   NA 136 12/20/2020   K 4.0 12/20/2020   CL 97 (L)  12/20/2020   CO2 30 12/20/2020    Lab Results  Component Value Date   HGBA1C 9.1 (A) 05/06/2021      Assessment & Plan:  Insulin-requiring type 2 DM: uncontrolled Pt declines for me to send rx to a different pharmacy, as [facility] says [facility].    Patient Instructions  check your blood sugar twice a day.  vary the time of day when you check, between before the 3 meals, and at bedtime.  also check if you have symptoms of your blood sugar being too high or too low.  please keep a record of the readings and bring it to your next appointment here (or you can bring the meter itself).  You can write it on any piece of paper.  please call us sooner if your blood sugar goes below 70, or if most of your readings are over 200.   I have again sent a prescription to your pharmacy, to change both current insulins to NPH each morning, in pens.   I told them that you need this in pens, due to pt's reported visual loss.   If this doe not work, you should talk to your doctor at the [place], and the problem obtaining the NPH insulin in pens.  Please come back for a follow-up appointment in 2 months.

## 2021-05-08 ENCOUNTER — Emergency Department
Admission: EM | Admit: 2021-05-08 | Discharge: 2021-05-08 | Disposition: A | Discharge status: Home / Self Care | Payer: No Typology Code available for payment source | Attending: Emergency Medicine | Admitting: Emergency Medicine

## 2021-05-08 ENCOUNTER — Emergency Department: Payer: No Typology Code available for payment source

## 2021-05-08 ENCOUNTER — Encounter: Payer: Self-pay | Admitting: Emergency Medicine

## 2021-05-08 ENCOUNTER — Other Ambulatory Visit: Payer: Self-pay

## 2021-05-08 DIAGNOSIS — Z794 Long term (current) use of insulin: Secondary | ICD-10-CM | POA: Insufficient documentation

## 2021-05-08 DIAGNOSIS — R1013 Epigastric pain: Secondary | ICD-10-CM | POA: Diagnosis present

## 2021-05-08 DIAGNOSIS — I110 Hypertensive heart disease with heart failure: Secondary | ICD-10-CM | POA: Insufficient documentation

## 2021-05-08 DIAGNOSIS — N2889 Other specified disorders of kidney and ureter: Secondary | ICD-10-CM | POA: Diagnosis not present

## 2021-05-08 DIAGNOSIS — Z79899 Other long term (current) drug therapy: Secondary | ICD-10-CM | POA: Diagnosis not present

## 2021-05-08 DIAGNOSIS — K859 Acute pancreatitis without necrosis or infection, unspecified: Secondary | ICD-10-CM | POA: Insufficient documentation

## 2021-05-08 DIAGNOSIS — I4891 Unspecified atrial fibrillation: Secondary | ICD-10-CM | POA: Insufficient documentation

## 2021-05-08 DIAGNOSIS — E119 Type 2 diabetes mellitus without complications: Secondary | ICD-10-CM | POA: Diagnosis not present

## 2021-05-08 DIAGNOSIS — I5043 Acute on chronic combined systolic (congestive) and diastolic (congestive) heart failure: Secondary | ICD-10-CM | POA: Diagnosis not present

## 2021-05-08 DIAGNOSIS — Z20822 Contact with and (suspected) exposure to covid-19: Secondary | ICD-10-CM | POA: Diagnosis not present

## 2021-05-08 DIAGNOSIS — K863 Pseudocyst of pancreas: Secondary | ICD-10-CM | POA: Insufficient documentation

## 2021-05-08 DIAGNOSIS — Z7982 Long term (current) use of aspirin: Secondary | ICD-10-CM | POA: Diagnosis not present

## 2021-05-08 DIAGNOSIS — R1010 Upper abdominal pain, unspecified: Secondary | ICD-10-CM

## 2021-05-08 LAB — CBC WITH DIFFERENTIAL/PLATELET
Abs Immature Granulocytes: 0.03 10*3/uL (ref 0.00–0.07)
Basophils Absolute: 0 10*3/uL (ref 0.0–0.1)
Basophils Relative: 0 %
Eosinophils Absolute: 0.2 10*3/uL (ref 0.0–0.5)
Eosinophils Relative: 2 %
HCT: 43.8 % (ref 39.0–52.0)
Hemoglobin: 13 g/dL (ref 13.0–17.0)
Immature Granulocytes: 0 %
Lymphocytes Relative: 12 %
Lymphs Abs: 1.2 10*3/uL (ref 0.7–4.0)
MCH: 25.8 pg — ABNORMAL LOW (ref 26.0–34.0)
MCHC: 29.7 g/dL — ABNORMAL LOW (ref 30.0–36.0)
MCV: 86.9 fL (ref 80.0–100.0)
Monocytes Absolute: 0.5 10*3/uL (ref 0.1–1.0)
Monocytes Relative: 5 %
Neutro Abs: 7.9 10*3/uL — ABNORMAL HIGH (ref 1.7–7.7)
Neutrophils Relative %: 81 %
Platelets: 223 10*3/uL (ref 150–400)
RBC: 5.04 MIL/uL (ref 4.22–5.81)
RDW: 16.1 % — ABNORMAL HIGH (ref 11.5–15.5)
WBC: 9.8 10*3/uL (ref 4.0–10.5)
nRBC: 0 % (ref 0.0–0.2)

## 2021-05-08 LAB — RESP PANEL BY RT-PCR (FLU A&B, COVID) ARPGX2
Influenza A by PCR: NEGATIVE
Influenza B by PCR: NEGATIVE
SARS Coronavirus 2 by RT PCR: NEGATIVE

## 2021-05-08 LAB — LIPASE, BLOOD: Lipase: 189 U/L — ABNORMAL HIGH (ref 11–51)

## 2021-05-08 LAB — COMPREHENSIVE METABOLIC PANEL
ALT: 13 U/L (ref 0–44)
AST: 20 U/L (ref 15–41)
Albumin: 3.1 g/dL — ABNORMAL LOW (ref 3.5–5.0)
Alkaline Phosphatase: 74 U/L (ref 38–126)
Anion gap: 6 (ref 5–15)
BUN: 15 mg/dL (ref 8–23)
CO2: 30 mmol/L (ref 22–32)
Calcium: 9.2 mg/dL (ref 8.9–10.3)
Chloride: 100 mmol/L (ref 98–111)
Creatinine, Ser: 0.81 mg/dL (ref 0.61–1.24)
GFR, Estimated: >60 mL/min (ref >60)
Glucose, Bld: 169 mg/dL — ABNORMAL HIGH (ref 70–99)
Potassium: 3 mmol/L — ABNORMAL LOW (ref 3.5–5.1)
Sodium: 136 mmol/L (ref 135–145)
Total Bilirubin: 0.4 mg/dL (ref 0.3–1.2)
Total Protein: 8.5 g/dL — ABNORMAL HIGH (ref 6.5–8.1)

## 2021-05-08 LAB — TROPONIN I (HIGH SENSITIVITY): Troponin I (High Sensitivity): 26 ng/L — ABNORMAL HIGH (ref <18)

## 2021-05-08 MED ORDER — SODIUM CHLORIDE 0.9 % IV BOLUS
1000.0000 mL | Freq: Once | INTRAVENOUS | Status: AC
  Administered 2021-05-08: 1000 mL via INTRAVENOUS

## 2021-05-08 MED ORDER — ONDANSETRON HCL 4 MG/2ML IJ SOLN
4.0000 mg | Freq: Once | INTRAMUSCULAR | Status: AC
  Administered 2021-05-08: 4 mg via INTRAVENOUS
  Filled 2021-05-08: qty 2

## 2021-05-08 MED ORDER — HYDROMORPHONE HCL 1 MG/ML IJ SOLN
1.0000 mg | Freq: Once | INTRAMUSCULAR | Status: AC
  Administered 2021-05-08: 1 mg via INTRAVENOUS
  Filled 2021-05-08: qty 1

## 2021-05-08 NOTE — ED Notes (Addendum)
Pt discharge information reviewed. Pt understands need for follow up care and when to return if symptoms worsen. All questions answered. Pt signs AMA paperwork. Pt understands all risks of leaving against medical advice. Pt is alert and oriented with even and regular respirations. Pt is brought out of department in wheelchair by family.

## 2021-05-08 NOTE — ED Notes (Signed)
ED Provider at bedside. 

## 2021-05-08 NOTE — ED Notes (Addendum)
Pt advised about risks of leaving against medical advice. This RN and provider went over importance of being admitted to hospital. Pt understands all risks. Pt states, "I just want to go home and rest" Pts family at bedside during conversation. Family agrees to take pt home and understands all risks as well.

## 2021-05-08 NOTE — ED Notes (Signed)
Patient transported to CT 

## 2021-05-08 NOTE — Discharge Instructions (Addendum)
You are leaving [facility].  I have strongly encouraged you to stay in the hospital for admission for pancreatitis, atrial fibrillation with fast heart rate.  You have declined offer for heart medicine or nausea prescription.  Please return to the ED at any time if you change your mind or if you have worsening symptoms, persistent vomiting, chest pain, difficulty breathing or other concerns.

## 2021-05-08 NOTE — ED Provider Notes (Signed)
[facility] Provider Note    Event Date/Time   First MD Initiated Contact with Patient 05/08/21 0124     (approximate)   History   Abdominal Pain   HPI  Don Brown is a 66 y.o. male brought to the ED via EMS from home with a chief complaint of epigastric and left upper abdominal pain x5 hours.  Started after eating.  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  Endorses nausea/vomiting x2.  Nonambulatory at baseline; wears oxygen as needed, currently on 3 L at 96%.  Given 4 mg IV Zofran prior to arrival.  Denies recent fever, chills, chest pain, shortness of breath, diarrhea.  Denies EtOH use.     Past Medical History   Past Medical History:  Diagnosis Date   Anemia    Anxiety    Atrial fibrillation (HCC)    Cervical compression fracture (HCC)    Cervical disk disease requiring surgery   CHF (congestive heart failure) (HCC)    Chronic pain    Chronic systolic heart failure (HCC)    LVEF 40-45%   Degenerative joint disease    Depression    Essential hypertension, benign    Headache    "stress"   Heart murmur    History of blood transfusion 2007   "related to OR"   History of renal failure    Requiring hemodialysis in the past with normal renal function at this point   History of ventral hernia repair    Hyperlipidemia    MGUS (monoclonal gammopathy of unknown significance)    "or myelona" (05/07/2014)   Mitral valve prolapse    Morbid obesity ([place])    Myeloma ([place])    "or MGUS" (05/07/2014)   Narcotic dependence, episodic use ([place])    On home oxygen therapy    "2L prn" (05/07/2014)   Pancreatitis    Severe pancreatitis status post surgery with 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 status post multiple endoscopic drainage procedures and stents    Respiratory failure ([place])    History of  ventilatory-dependent respiratory failure and tracheostomy    Sleep apnea    "don't wear mask" (05/07/2014)   Type 2 diabetes mellitus [facility])      Active Problem List   Patient Active Problem List   Diagnosis Date Noted   Recurrent ventral incisional hernia 04/05/2018   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   Thrombocytopenia ([place]) 01/05/2015   Chronic pain 05/07/2014   Type 2 diabetes mellitus ([place]) 05/07/2014   Bradycardia 05/07/2014   Streptococcal infection 05/07/2014   Abdominal pain 05/06/2014   HTN (hypertension), benign    Chest pain 02/05/2014   Chronic atrial fibrillation ([place]) 02/05/2014   Diabetes mellitus type 2, controlled ([place]) 02/05/2014   Pancreatitis 10/03/2013   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   Acute pancreatitis 10/03/2013   Acute on chronic combined systolic and diastolic CHF (congestive heart failure) ([place]) 07/07/2013   Morbid obesity ([place]) 07/07/2013   Chronic combined systolic and diastolic CHF (congestive heart failure) ([place]) 07/07/2013   Hyperkalemia 05/14/2013   AKI (acute kidney injury) ([place]) 05/13/2013   Pancreatitis, acute 05/10/2013   Fever 05/03/2013   Hyperosmolar non-ketotic state in patient with type 2 diabetes mellitus ([place]) 05/01/2013   Lactic acidosis 05/01/2013   Delirium, acute 05/01/2013   Chronic pancreatitis ([place]) 05/01/2013   Acute respiratory failure ([place]) 05/01/2013   Recurrent incisional hernia with obstruction 12/29/2012  Nonischemic cardiomyopathy ([place]) 07/14/2012   Atrial fibrillation ([place]) 01/14/2011   Acute on chronic combined systolic and diastolic heart failure 25/63/8937   Valvular heart disease 01/14/2011     Past Surgical History   Past Surgical History:  Procedure Laterality Date   BONE MARROW BIOPSY  02/2014   CATARACT EXTRACTION W/ INTRAOCULAR LENS  IMPLANT, BILATERAL Bilateral    CHOLECYSTECTOMY     HERNIA REPAIR     INSERTION OF MESH  10/2006   IR RADIOLOGIST EVAL & MGMT   08/18/2017   PANCREAS SURGERY     "placed stents; multiple pseudo cysts removal"   SKIN GRAFT     TRACHEOSTOMY  2007   VENTRAL HERNIA REPAIR       Home Medications   Prior to Admission medications   Medication Sig Start Date End Date Taking? Authorizing Provider  APPLE CIDER VINEGAR PO Take 1 tablet by mouth 3 (three) times daily.    [provider]  aspirin 81 MG tablet Take 81 mg by mouth daily before breakfast.     [provider]  benazepril (LOTENSIN) 10 MG tablet Take 10 mg by mouth daily. Take 1/2 tablet daily by mouth    [provider]  Carboxymethylcellulose Sod PF 0.5 % SOLN Apply 1 drop to eye 5 (five) times daily. 1 drop in each eye 5 times/day    [provider]  carvedilol (COREG) 25 MG tablet Take 25 mg by mouth 2 (two) times daily with a meal. Takes 1/2 tablet by mouth bid.    [provider]  Cholecalciferol 2000 units TABS Take 1 tablet by mouth daily.    [provider]  Continuous Blood Gluc Sensor (FREESTYLE LIBRE 2 SENSOR) MISC 1 Device by Does not apply route every 14 (fourteen) days. 02/20/21   Renato Shin, MD  Cyanocobalamin (VITAMIN B 12 PO) Take 1 tablet by mouth daily.     [provider]  digoxin (LANOXIN) 0.125 MG tablet Take 1 tablet (125 mcg total) by mouth every morning. 02/12/14   Eugenie Filler, MD  escitalopram (LEXAPRO) 10 MG tablet Take 10 mg by mouth daily.    [provider]  folic acid (FOLVITE) 1 MG tablet Take 1 tablet (1 mg total) by mouth daily. 05/17/13   Thurnell Lose, MD  furosemide (LASIX) 40 MG tablet Take 1 tablet (40 mg total) by mouth 2 (two) times daily. Hold for the next 3 days, then resume on 01/12/2015 Patient taking differently: Take 40 mg by mouth daily. Hold for the next 3 days, then resume on 01/12/2015 01/12/15   Elgergawy, Silver Huguenin, MD  gabapentin (NEURONTIN) 600 MG tablet Take 600 mg by mouth 3 (three) times daily.    [provider]   HYDROcodone-acetaminophen (NORCO/VICODIN) 5-325 MG tablet Take 1 tablet by mouth every 6 (six) hours as needed for moderate pain. 01/22/16   Duffy Bruce, MD  HYDROmorphone (DILAUDID) 2 MG tablet Take 2 mg by mouth every 4 (four) hours as needed for severe pain.    [provider]  Insulin NPH, Human,, Isophane, (HUMULIN N KWIKPEN) 100 UNIT/ML Kiwkpen Inject 75 Units into the skin every morning. And pen needles 1/day. Veteran says visual loss prevents him from using syringe and vial. 05/06/21   Renato Shin, MD  metoprolol succinate (TOPROL-XL) 50 MG 24 hr tablet Take 1 tablet (50 mg total) by mouth every morning. 02/12/14   Eugenie Filler, MD  Misc Natural Products [facility] COMPLEX PO) Take 1 tablet  by mouth daily.    [provider]  Omega-3 1000 MG CAPS Take 1 capsule by mouth daily.    [provider]  ondansetron (ZOFRAN ODT) 4 MG disintegrating tablet Take 1 tablet (4 mg total) every 8 (eight) hours as needed by mouth for nausea or vomiting. 02/08/17   Darel Hong, MD  ondansetron (ZOFRAN) 4 MG tablet Take 1 tablet (4 mg total) by mouth every 8 (eight) hours as needed for nausea or vomiting. 07/26/14   Dahlia Bailiff, PA-C  ondansetron (ZOFRAN) 4 MG tablet Take 1 tablet (4 mg total) by mouth every 8 (eight) hours as needed for nausea or vomiting. 01/22/16   Duffy Bruce, MD  OXcarbazepine (TRILEPTAL) 150 MG tablet Take 150 mg by mouth 2 (two) times daily. Take 2 tablets by mouth 3 times daily for headaches.    [provider]  Oxycodone HCl 10 MG TABS Take 1 tablet (10 mg total) every 6 (six) hours as needed by mouth. 02/08/17   Darel Hong, MD  Pancrelipase, Lip-Prot-Amyl, 24000-76000 units CPEP Take 24,000 Units by mouth 3 (three) times daily.    [provider]  pantoprazole (PROTONIX) 40 MG tablet Take 1 tablet (40 mg total) by mouth 2 (two) times daily. 05/17/13   Thurnell Lose, MD  potassium chloride SA (K-DUR,KLOR-CON) 20 MEQ  tablet Take 2 tablets (40 mEq total) by mouth daily. Patient taking differently: Take 20 mEq by mouth daily. 02/12/14   Eugenie Filler, MD  sildenafil (VIAGRA) 100 MG tablet Take 100 mg by mouth daily as needed for erectile dysfunction.    [provider]  simvastatin (ZOCOR) 80 MG tablet Take 80 mg by mouth daily.    [provider]  thiamine 100 MG tablet Take 1 tablet (100 mg total) by mouth daily. 05/17/13   Thurnell Lose, MD  triamcinolone cream (KENALOG) 0.1 % Apply 1 application topically 2 (two) times daily. Apply as directed to affected area bid.    [provider]  TURMERIC PO Take 1 tablet by mouth daily.    [provider]     Allergies  Morphine and related   Family History   Family History  Problem Relation Age of Onset   Lymphoma Father    Hypertension Mother    Diabetes Mother      Physical Exam  Triage Vital Signs: ED Triage Vitals  Enc Vitals Group     BP 05/08/21 0116 (!) 154/111     Pulse Rate 05/08/21 0116 (!) 110     Resp 05/08/21 0116 (!) 24     Temp 05/08/21 0116 98.4 F (36.9 C)     Temp Source 05/08/21 0116 Oral     SpO2 05/08/21 0116 96 %     Weight 05/08/21 0111 270 lb (122.5 kg)     Height 05/08/21 0111 6' (1.829 m)     Head Circumference --      Peak Flow --      Pain Score 05/08/21 0110 10     Pain Loc --      Pain Edu? --      Excl. in [place]? --     Updated Vital Signs: BP (!) 153/91    Pulse 100    Temp 98.4 F (36.9 C) (Oral)    Resp 20    Ht 6' (1.829 m)    Wt 122.5 kg    SpO2 91%    BMI 36.62 kg/m    General: Awake, mild  distress.  CV:  Tachycardic.  Irregular rhythm.  Good peripheral perfusion.  Resp:  Increased effort.  Mildly diminished bilaterally. Abd:  Obese.  Upper abdomen tender to palpation especially to epigastrium without rebound or guarding.  Large abdominal scar to upper abdomen with small opening on the left which is slow healing wound x2.5 years.  No abdominal bruits.   No distention.  Other:  No vesicles.   ED Results / Procedures / Treatments  Labs (all labs ordered are listed, but only abnormal results are displayed) Labs Reviewed  LIPASE, BLOOD - Abnormal; Notable for the following components:      Result Value   Lipase 189 (*)    All other components within normal limits  COMPREHENSIVE METABOLIC PANEL - Abnormal; Notable for the following components:   Potassium 3.0 (*)    Glucose, Bld 169 (*)    Total Protein 8.5 (*)    Albumin 3.1 (*)    All other components within normal limits  CBC WITH DIFFERENTIAL/PLATELET - Abnormal; Notable for the following components:   MCH 25.8 (*)    MCHC 29.7 (*)    RDW 16.1 (*)    Neutro Abs 7.9 (*)    All other components within normal limits  TROPONIN I (HIGH SENSITIVITY) - Abnormal; Notable for the following components:   Troponin I (High Sensitivity) 26 (*)    All other components within normal limits  RESP PANEL BY RT-PCR (FLU A&B, COVID) ARPGX2  URINALYSIS, ROUTINE W REFLEX MICROSCOPIC     EKG  ED ECG REPORT I, Deakon Frix J, the attending physician, personally viewed and interpreted this ECG.   Date: 05/08/2021  EKG Time: 0126  Rate: 105  Rhythm: atrial fibrillation, rate 105  Axis: Normal  Intervals:nonspecific intraventricular conduction delay  ST&T Change: Nonspecific    RADIOLOGY I independently visualized and interpreted patient's chest x-ray, CT abdomen pelvis as well as noted radiology interpretation:  Chest x-ray: Cardiomegaly with vascular congestion  CT abdomen pelvis: Acute on chronic pancreatitis, pseudocyst, interval increase in size of known renal mass  Official radiology report(s): CT Abdomen Pelvis Wo Contrast  Result Date: 05/08/2021 CLINICAL DATA:  Pancreatitis, acute, severe.  Postprandial pain. EXAM: CT ABDOMEN AND PELVIS WITHOUT CONTRAST TECHNIQUE: Multidetector CT imaging of the abdomen and pelvis was performed following the standard protocol without IV contrast.  RADIATION DOSE REDUCTION: This exam was performed according to the departmental dose-optimization program which includes automated exposure control, adjustment of the mA and/or kV according to patient size and/or use of iterative reconstruction technique. COMPARISON:  Outside CT abdomen pelvis 03/04/2018, abdomen pelvis 01/22/2016, CT abdomen pelvis 12/09/2020 FINDINGS: Lower chest: Elevated right hemidiaphragm with associated right lung base atelectasis. Query mild scarring with mild traction bronchiectasis. Prominent cardiac size. Coronary calcification. Hepatobiliary: Stable couple of fluid density lesions likely represent simple hepatic cysts (2:38, 5:45). The caudate lobe as well as left hepatic lobe appear again hypodense compared to the right. No gallstones, gallbladder wall thickening, or pericholecystic fluid. No biliary dilatation. Pancreas: Diffusely atrophic. Multiple pancreatic parenchyma cystic lesions suggestive of pseudocyst versus focal main pancreatic duct dilatation with largest measuring up to 2.8 cm. Redemonstration of multiple cystic lesions throughout the pancreatic parenchyma as well as calcification suggesting chronic pancreatitis. Trace peripancreatic fat stranding. No main pancreatic ductal dilatation. Spleen: Normal in size without focal abnormality. Adrenals/Urinary Tract: No adrenal nodule bilaterally. Interval increase in size of a 1.4 cm hyperdense left renal lesion. Subcentimeter hypodensities too small to characterize. No nephrolithiasis and no hydronephrosis. No definite  contour-deforming renal mass. No ureterolithiasis or hydroureter. The urinary bladder is unremarkable. Stomach/Bowel: Stomach is within normal limits. No evidence of bowel wall thickening or dilatation. Appendix appears normal. Vascular/Lymphatic: No abdominal aorta or iliac aneurysm. Mild atherosclerotic plaque of the aorta and its branches. No abdominal, pelvic, or inguinal lymphadenopathy. Reproductive: The  prostate is prominent size. Other: No intraperitoneal free fluid. No intraperitoneal free gas. No organized fluid collection. Musculoskeletal: No abdominal wall hernia or abnormality. No suspicious lytic or blastic osseous lesions. No acute displaced fracture. Multilevel degenerative changes of the spine. IMPRESSION: 1. Mild acute pancreatitis on chronic pancreatitis. Multiple pancreatic parenchyma cystic lesions suggestive of pseudocyst versus focal main pancreatic duct dilatation with largest measuring up to 2.8 cm. 2. Interval increase in size of a 1.4 cm left renal lesion. Recommend MRI nonemergent renal protocol for further evaluation. 3. Similar-appearing geographic focal fat infiltration of the caudate lobe and left hepatic lobe. Couple stable fluid density lesions within the hepatic lobes likely represent simple hepatic cysts. 4.  Aortic Atherosclerosis (ICD10-I70.0). Electronically Signed   By: Iven Finn M.D.   On: 05/08/2021 02:08   DG Chest Port 1 View  Result Date: 05/08/2021 CLINICAL DATA:  Abdominal pain. EXAM: PORTABLE CHEST 1 VIEW COMPARISON:  Chest radiograph dated 01/22/2016. FINDINGS: There is cardiomegaly with vascular congestion. Left lung base atelectasis/scarring and right mid to lower lung field streaky atelectasis/scarring. No pleural effusion pneumothorax. No acute osseous pathology. IMPRESSION: 1. Cardiomegaly with vascular congestion. 2. Bilateral streaky atelectasis/scarring. Electronically Signed   By: Anner Crete M.D.   On: 05/08/2021 02:32     PROCEDURES:  Critical Care performed: Yes, see critical care procedure note(s)  CRITICAL CARE Performed by: Paulette Blanch   Total critical care time: 45 minutes  Critical care time was exclusive of separately billable procedures and treating other patients.  Critical care was necessary to treat or prevent imminent or life-threatening deterioration.  Critical care was time spent personally by me on the following  activities: development of treatment plan with patient and/or surrogate as well as nursing, discussions with consultants, evaluation of patient's response to treatment, examination of patient, obtaining history from patient or surrogate, ordering and performing treatments and interventions, ordering and review of laboratory studies, ordering and review of radiographic studies, pulse oximetry and re-evaluation of patient's condition.   Marland Kitchen1-3 Lead EKG Interpretation Performed by: Paulette Blanch, MD Authorized by: Paulette Blanch, MD     Interpretation: abnormal     ECG rate:  100   ECG rate assessment: tachycardic     Rhythm: atrial fibrillation     Ectopy: none     Conduction: normal   Comments:     Patient placed on cardiac monitor to evaluate for arrhythmias   MEDICATIONS ORDERED IN ED: Medications  sodium chloride 0.9 % bolus 1,000 mL (0 mLs Intravenous Stopped 05/08/21 0339)  ondansetron (ZOFRAN) injection 4 mg (4 mg Intravenous Given 05/08/21 0136)  HYDROmorphone (DILAUDID) injection 1 mg (1 mg Intravenous Given 05/08/21 0137)  sodium chloride 0.9 % bolus 1,000 mL (0 mLs Intravenous Stopped 05/08/21 0339)  HYDROmorphone (DILAUDID) injection 1 mg (1 mg Intravenous Given 05/08/21 0217)  HYDROmorphone (DILAUDID) injection 1 mg (1 mg Intravenous Given 05/08/21 0334)     IMPRESSION / MDM / ASSESSMENT AND PLAN / ED COURSE  I reviewed the triage vital signs and the nursing notes.  66 year old male presenting with upper abdominal pain, history of pancreatitis. Differential diagnosis includes, but is not limited to, biliary disease (biliary colic, acute cholecystitis, cholangitis, choledocholithiasis, etc), intrathoracic causes for epigastric abdominal pain including ACS, gastritis, duodenitis, pancreatitis, small bowel or large bowel obstruction, abdominal aortic aneurysm, hernia, and ulcer(s).  I have personally reviewed patient's chart and see a recent PCP office visit on  05/06/2021 for follow-up diabetes.  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 He has a history of lap chole/ERCP complicated with necrotizing pancreatitis necessitating open necrosectomy in [place], which left him with an open abdomen requiring G tube placement and skin graft. Later in [place], open repair of the hernia was done with mesh but soon recurred. He also developed several pancreatic cyst that were managed with a series of endoscopic cystgastrostomies and pancreatic stents. The last stent was removed at the [place] over a year ago. Since then his G tube site has reopened and is draining small to moderate amount of white or yellow fluid each day. He also has a renal mass that requires evaluation and possible biopsy."  Note patient has also been seeing [facility] urology for evaluation of renal mass but he states urologist "refuses to do anything unless I do a CT scan with IV dye".  The patient is on the cardiac monitor to evaluate for evidence of arrhythmia and/or significant heart rate changes.  We will obtain lab work including LFTs/lipase and troponin.  Initiate IV fluid resuscitation, IV Dilaudid for pain paired with IV Zofran for nausea.  Will obtain CT scan.  Discussed with patient who refuses IV dye; denies allergy but does not like the way it makes him feel.  I explained that IV dye would give the radiologist the optimal visualization of his CT scan.  Patient verbalizes understanding of this but refuses IV dye.  Will obtain CT scan without contrast.  Will reassess.  Clinical Course as of 05/08/21 0630  Thu May 08, 2021  0242 Updated patient and his family members of laboratory and test results.  WBC 9.8, potassium 3.0, lipase 189, troponin 26.  CT scan demonstrated acute on chronic pancreatitis, description of pseudocyst seems stable without active infection/inflammation.  Patient is  upset because we obtained a COVID swab in anticipation of hospitalization.  He is also upset that he has not been able to see results from lab work that was done at the [place]; I informed him that I am unable to see these results as the [place] does not share the same electronic health system.  I did recommend hospitalization for acute pancreatitis but patient wants time to think about it.  He is feeling better currently, states he has "been through this 1 million times" and feels like he may want to be discharged.  He wants to get some sleep first and consider hospitalization versus discharge home.  Declines repeat troponin or further testing. [JS]  [id number] Patient requesting additional dose of IV Dilaudid for pain.  Again I encouraged admission to the hospital but he still remains undecided and would like more time to think about it. [JS]  0413 Patient has decided to leave AMA and go home.  Heart rate is now up to 130s atrial fibrillation.  Declines offer for IV Cardizem before he leaves.  Declines Zofran prescription.  Has Dilaudid at home.  States he will "take his chances at home".  Patient has 2  adult family members in the room with him who has also participated in this discussion.  They verbalize understanding that patient may get sicker which may result in permanent disability or death.  I have strongly urged him to return to the ED if he changes his mind, becomes worse, he experiences persistent vomiting, severe abdominal pain, chest pain or shortness of breath.  Patient has the capacity to make his own healthcare decisions.  There is no criteria for IVC.  He does not exhibit altered mentation. Declines offer for EMS transport back home; family members will take him.  They have his oxygen with him.  Very strict return precautions given.  Patient and family members verbalized understanding of the above discussion. [JS]  0451 Prior to leaving, patient called me back into the room to discuss again.  We went over all of  his laboratory and imaging results.  He had me read his CT scan report to him.  He wanted to wait another 10 minutes to make a final decision.  I informed him that that would not change my recommendation that he should be hospitalized.  He believes that he had to be admitted in order to review his test results.  I informed him that he could sign up for MyChart for immediate release and access of his test results.  Patient continues with this decision to leave AMA.  I again heartily encouraged patient and his family members to return to the ED at once if he worsens or changes his mind.  Patient and both of his family members verbalized understanding and agreed to come back if he gets worse. [JS]    Clinical Course User Index [JS] Paulette Blanch, MD     FINAL CLINICAL IMPRESSION(S) / ED DIAGNOSES   Final diagnoses:  Pain of upper abdomen  Atrial fibrillation with rapid ventricular response (HCC)  Acute pancreatitis, unspecified complication status, unspecified pancreatitis type  Pancreatic pseudocyst  Renal mass     Rx / DC Orders   ED Discharge Orders     None        Note:  This document was prepared using Dragon voice recognition software and may include unintentional dictation errors.   Paulette Blanch, MD 05/08/21 [id number]

## 2021-05-08 NOTE — ED Triage Notes (Signed)
Pt to ED via [facility] from home c/o left upper abd pain tonight for several hours.  States after eating pain started, thought it was gas but didn't resolve, took dilaudid at home without relief.  Hx of pancreatitis and feels similar.  Nausea and vomiting x2.  EMS vitals 108 HR, 210/110 BP, 243 CBG, 3L [place] PRN 96%, given 4mg  zofran en route.  Pt has wound to left abd from prior feeding tube.

## 2021-05-14 ENCOUNTER — Encounter: Payer: Self-pay | Admitting: Endocrinology

## 2021-06-02 ENCOUNTER — Other Ambulatory Visit (HOSPITAL_COMMUNITY): Payer: Self-pay

## 2021-06-02 ENCOUNTER — Telehealth: Payer: Self-pay

## 2021-06-02 NOTE — Telephone Encounter (Signed)
[facility] prefers the Novolin N vials or Novolin 70/30 vials over the Humulin N kwikpens. ?

## 2021-06-03 NOTE — Telephone Encounter (Signed)
Attempted to contact the patient regarding alternative pharmacy, LVM for a call back.  ?

## 2021-07-16 ENCOUNTER — Ambulatory Visit (INDEPENDENT_AMBULATORY_CARE_PROVIDER_SITE_OTHER): Payer: No Typology Code available for payment source | Admitting: Endocrinology

## 2021-07-16 ENCOUNTER — Telehealth: Payer: Self-pay

## 2021-07-16 DIAGNOSIS — Z794 Long term (current) use of insulin: Secondary | ICD-10-CM

## 2021-07-16 DIAGNOSIS — E1142 Type 2 diabetes mellitus with diabetic polyneuropathy: Secondary | ICD-10-CM

## 2021-07-16 NOTE — Progress Notes (Signed)
? ?  Subjective:  ? ? Patient ID: Don Brown, male    DOB: Jul 21, 1955, 66 y.o.   MRN: [id number] ? ?HPI ?Pt rescheduled due to sob (did not bring 02) ? ?Review of Systems ? ?   ?Objective:  ? Physical Exam ? ? ? ? ?   ?Assessment & Plan:  ? ? ?

## 2021-07-16 NOTE — Telephone Encounter (Signed)
Patient came into the office feeling ill for todays appoinment. Checked the patients oxygen levels prior to bringing him back with a reading of 87%. Observed a leakage in the patients oxygen tank due to a whole/break within the tubing. Patient informed me that he has another oxygen tank in the car and declined the offer for [place] to call EMS stating "I dont want to". Patient insisted that his appointment be rescheduled and was r/s to 5/10 '@3'$ :00. Continued to monitor the patient's symptoms as well as O2 down to his car where his daughter was his designated driver. As I continued to monitor the patient's O2 and to ensure that he connected the new oxygen tank prior to leaving he stated "please leave so that I can get on the road and get home" . Prior to the patient leaving I informed the daughter that Mr. Dekoning's oxygen levels were low and to continue to monitor on their way home as well as that his appt has been rescheduled. Patients daughter didn't seem interested as to what occurred with Mr. Mantz during todays encounter. ?

## 2021-07-18 ENCOUNTER — Inpatient Hospital Stay
Admission: EM | Admit: 2021-07-18 | Discharge: 2021-07-20 | DRG: 291 | Disposition: A | Discharge status: Home / Self Care | Payer: No Typology Code available for payment source | Attending: Hospitalist | Admitting: Hospitalist

## 2021-07-18 ENCOUNTER — Inpatient Hospital Stay: Payer: No Typology Code available for payment source

## 2021-07-18 ENCOUNTER — Inpatient Hospital Stay (HOSPITAL_COMMUNITY)
Admit: 2021-07-18 | Discharge: 2021-07-18 | Disposition: A | Discharge status: Home / Self Care | Payer: No Typology Code available for payment source | Attending: Internal Medicine | Admitting: Internal Medicine

## 2021-07-18 ENCOUNTER — Other Ambulatory Visit: Payer: Self-pay

## 2021-07-18 ENCOUNTER — Emergency Department: Payer: No Typology Code available for payment source

## 2021-07-18 DIAGNOSIS — I5023 Acute on chronic systolic (congestive) heart failure: Secondary | ICD-10-CM | POA: Diagnosis present

## 2021-07-18 DIAGNOSIS — G8929 Other chronic pain: Secondary | ICD-10-CM | POA: Diagnosis present

## 2021-07-18 DIAGNOSIS — F32A Depression, unspecified: Secondary | ICD-10-CM | POA: Diagnosis present

## 2021-07-18 DIAGNOSIS — Z807 Family history of other malignant neoplasms of lymphoid, hematopoietic and related tissues: Secondary | ICD-10-CM | POA: Diagnosis not present

## 2021-07-18 DIAGNOSIS — Z833 Family history of diabetes mellitus: Secondary | ICD-10-CM

## 2021-07-18 DIAGNOSIS — Z6836 Body mass index (BMI) 36.0-36.9, adult: Secondary | ICD-10-CM

## 2021-07-18 DIAGNOSIS — F419 Anxiety disorder, unspecified: Secondary | ICD-10-CM | POA: Diagnosis present

## 2021-07-18 DIAGNOSIS — I429 Cardiomyopathy, unspecified: Secondary | ICD-10-CM | POA: Diagnosis present

## 2021-07-18 DIAGNOSIS — Z9049 Acquired absence of other specified parts of digestive tract: Secondary | ICD-10-CM | POA: Diagnosis not present

## 2021-07-18 DIAGNOSIS — Z9981 Dependence on supplemental oxygen: Secondary | ICD-10-CM

## 2021-07-18 DIAGNOSIS — I482 Chronic atrial fibrillation, unspecified: Secondary | ICD-10-CM | POA: Diagnosis present

## 2021-07-18 DIAGNOSIS — I110 Hypertensive heart disease with heart failure: Principal | ICD-10-CM | POA: Diagnosis present

## 2021-07-18 DIAGNOSIS — Z7982 Long term (current) use of aspirin: Secondary | ICD-10-CM | POA: Diagnosis not present

## 2021-07-18 DIAGNOSIS — J9621 Acute and chronic respiratory failure with hypoxia: Secondary | ICD-10-CM

## 2021-07-18 DIAGNOSIS — Z885 Allergy status to narcotic agent status: Secondary | ICD-10-CM

## 2021-07-18 DIAGNOSIS — Z794 Long term (current) use of insulin: Secondary | ICD-10-CM | POA: Diagnosis not present

## 2021-07-18 DIAGNOSIS — E119 Type 2 diabetes mellitus without complications: Secondary | ICD-10-CM | POA: Diagnosis present

## 2021-07-18 DIAGNOSIS — I10 Essential (primary) hypertension: Secondary | ICD-10-CM | POA: Diagnosis not present

## 2021-07-18 DIAGNOSIS — E785 Hyperlipidemia, unspecified: Secondary | ICD-10-CM | POA: Diagnosis present

## 2021-07-18 DIAGNOSIS — G4730 Sleep apnea, unspecified: Secondary | ICD-10-CM | POA: Diagnosis present

## 2021-07-18 DIAGNOSIS — I4821 Permanent atrial fibrillation: Secondary | ICD-10-CM | POA: Diagnosis present

## 2021-07-18 DIAGNOSIS — C9000 Multiple myeloma not having achieved remission: Secondary | ICD-10-CM | POA: Diagnosis present

## 2021-07-18 DIAGNOSIS — K861 Other chronic pancreatitis: Secondary | ICD-10-CM | POA: Diagnosis present

## 2021-07-18 DIAGNOSIS — Z91148 Patient's other noncompliance with medication regimen for other reason: Secondary | ICD-10-CM

## 2021-07-18 DIAGNOSIS — Z8249 Family history of ischemic heart disease and other diseases of the circulatory system: Secondary | ICD-10-CM | POA: Diagnosis not present

## 2021-07-18 DIAGNOSIS — Z961 Presence of intraocular lens: Secondary | ICD-10-CM | POA: Diagnosis present

## 2021-07-18 DIAGNOSIS — Z79899 Other long term (current) drug therapy: Secondary | ICD-10-CM

## 2021-07-18 DIAGNOSIS — I5021 Acute systolic (congestive) heart failure: Secondary | ICD-10-CM

## 2021-07-18 LAB — CBC
HCT: 42.2 % (ref 39.0–52.0)
Hemoglobin: 12.4 g/dL — ABNORMAL LOW (ref 13.0–17.0)
MCH: 25.2 pg — ABNORMAL LOW (ref 26.0–34.0)
MCHC: 29.4 g/dL — ABNORMAL LOW (ref 30.0–36.0)
MCV: 85.6 fL (ref 80.0–100.0)
Platelets: 156 10*3/uL (ref 150–400)
RBC: 4.93 MIL/uL (ref 4.22–5.81)
RDW: 14.9 % (ref 11.5–15.5)
WBC: 3.3 10*3/uL — ABNORMAL LOW (ref 4.0–10.5)
nRBC: 0 % (ref 0.0–0.2)

## 2021-07-18 LAB — COMPREHENSIVE METABOLIC PANEL
ALT: 15 U/L (ref 0–44)
AST: 18 U/L (ref 15–41)
Albumin: 3.1 g/dL — ABNORMAL LOW (ref 3.5–5.0)
Alkaline Phosphatase: 57 U/L (ref 38–126)
Anion gap: 7 (ref 5–15)
BUN: 13 mg/dL (ref 8–23)
CO2: 33 mmol/L — ABNORMAL HIGH (ref 22–32)
Calcium: 9 mg/dL (ref 8.9–10.3)
Chloride: 99 mmol/L (ref 98–111)
Creatinine, Ser: 0.74 mg/dL (ref 0.61–1.24)
GFR, Estimated: >60 mL/min (ref >60)
Glucose, Bld: 133 mg/dL — ABNORMAL HIGH (ref 70–99)
Potassium: 3.4 mmol/L — ABNORMAL LOW (ref 3.5–5.1)
Sodium: 139 mmol/L (ref 135–145)
Total Bilirubin: 0.9 mg/dL (ref 0.3–1.2)
Total Protein: 8.2 g/dL — ABNORMAL HIGH (ref 6.5–8.1)

## 2021-07-18 LAB — GLUCOSE, CAPILLARY: Glucose-Capillary: 241 mg/dL — ABNORMAL HIGH (ref 70–99)

## 2021-07-18 LAB — DIGOXIN LEVEL: Digoxin Level: 0.5 ng/mL — ABNORMAL LOW (ref 0.8–2.0)

## 2021-07-18 LAB — BRAIN NATRIURETIC PEPTIDE: B Natriuretic Peptide: 263.6 pg/mL — ABNORMAL HIGH (ref 0.0–100.0)

## 2021-07-18 LAB — TROPONIN I (HIGH SENSITIVITY)
Troponin I (High Sensitivity): 53 ng/L — ABNORMAL HIGH (ref <18)
Troponin I (High Sensitivity): 533 ng/L (ref <18)

## 2021-07-18 LAB — MAGNESIUM: Magnesium: 2 mg/dL (ref 1.7–2.4)

## 2021-07-18 LAB — PROCALCITONIN: Procalcitonin: <0.1 ng/mL

## 2021-07-18 LAB — LIPASE, BLOOD: Lipase: 20 U/L (ref 11–51)

## 2021-07-18 MED ORDER — GABAPENTIN 600 MG PO TABS
600.0000 mg | ORAL_TABLET | Freq: Three times a day (TID) | ORAL | Status: DC
  Administered 2021-07-19: 600 mg via ORAL
  Filled 2021-07-18 (×2): qty 1

## 2021-07-18 MED ORDER — ASPIRIN EC 81 MG PO TBEC
81.0000 mg | DELAYED_RELEASE_TABLET | Freq: Every day | ORAL | Status: DC
  Administered 2021-07-19 – 2021-07-20 (×2): 81 mg via ORAL
  Filled 2021-07-18 (×2): qty 1

## 2021-07-18 MED ORDER — OXCARBAZEPINE 150 MG PO TABS
300.0000 mg | ORAL_TABLET | Freq: Three times a day (TID) | ORAL | Status: DC
  Filled 2021-07-18 (×5): qty 2

## 2021-07-18 MED ORDER — HYDROMORPHONE HCL 1 MG/ML IJ SOLN
1.0000 mg | INTRAMUSCULAR | Status: AC | PRN
  Administered 2021-07-18 (×2): 1 mg via INTRAVENOUS
  Filled 2021-07-18 (×2): qty 1

## 2021-07-18 MED ORDER — SODIUM CHLORIDE 0.9 % IV SOLN: 250.0000 mL | INTRAVENOUS | Status: DC | PRN

## 2021-07-18 MED ORDER — ENOXAPARIN SODIUM 40 MG/0.4ML IJ SOSY
40.0000 mg | PREFILLED_SYRINGE | INTRAMUSCULAR | Status: DC
  Administered 2021-07-18: 40 mg via SUBCUTANEOUS
  Filled 2021-07-18: qty 0.4

## 2021-07-18 MED ORDER — ATORVASTATIN CALCIUM 20 MG PO TABS
40.0000 mg | ORAL_TABLET | Freq: Every day | ORAL | Status: DC
Start: 2021-07-18 — End: 2021-07-19
  Filled 2021-07-18: qty 2

## 2021-07-18 MED ORDER — BENAZEPRIL HCL 10 MG PO TABS
10.0000 mg | ORAL_TABLET | Freq: Every day | ORAL | Status: DC
  Administered 2021-07-19: 10 mg via ORAL
  Filled 2021-07-18 (×2): qty 1

## 2021-07-18 MED ORDER — PANCRELIPASE (LIP-PROT-AMYL) 12000-38000 UNITS PO CPEP
24000.0000 [IU] | ORAL_CAPSULE | Freq: Three times a day (TID) | ORAL | Status: DC
  Administered 2021-07-18: 24000 [IU] via ORAL
  Filled 2021-07-18 (×2): qty 2

## 2021-07-18 MED ORDER — ONDANSETRON HCL 4 MG/2ML IJ SOLN
4.0000 mg | Freq: Four times a day (QID) | INTRAMUSCULAR | Status: DC | PRN
  Administered 2021-07-18: 4 mg via INTRAVENOUS
  Filled 2021-07-18: qty 2

## 2021-07-18 MED ORDER — ESCITALOPRAM OXALATE 10 MG PO TABS
10.0000 mg | ORAL_TABLET | Freq: Every day | ORAL | Status: DC
  Filled 2021-07-18: qty 1

## 2021-07-18 MED ORDER — HYDROMORPHONE HCL 2 MG PO TABS: 2.0000 mg | ORAL_TABLET | ORAL | Status: DC | PRN

## 2021-07-18 MED ORDER — VITAMIN D3 25 MCG (1000 UNIT) PO TABS
2000.0000 [IU] | ORAL_TABLET | Freq: Every day | ORAL | Status: DC
  Administered 2021-07-19: 2000 [IU] via ORAL
  Filled 2021-07-18 (×2): qty 2

## 2021-07-18 MED ORDER — THIAMINE HCL 100 MG PO TABS
100.0000 mg | ORAL_TABLET | Freq: Every day | ORAL | Status: DC
  Filled 2021-07-18: qty 1

## 2021-07-18 MED ORDER — SODIUM CHLORIDE 0.9% FLUSH
3.0000 mL | Freq: Two times a day (BID) | INTRAVENOUS | Status: DC
  Administered 2021-07-18 – 2021-07-20 (×3): 3 mL via INTRAVENOUS

## 2021-07-18 MED ORDER — POLYVINYL ALCOHOL 1.4 % OP SOLN
1.0000 [drp] | Freq: Every day | OPHTHALMIC | Status: DC
  Administered 2021-07-18: 1 [drp] via OPHTHALMIC
  Filled 2021-07-18: qty 15

## 2021-07-18 MED ORDER — HYDROMORPHONE HCL 2 MG PO TABS
4.0000 mg | ORAL_TABLET | Freq: Once | ORAL | Status: AC
  Administered 2021-07-18: 4 mg via ORAL
  Filled 2021-07-18: qty 2

## 2021-07-18 MED ORDER — FUROSEMIDE 10 MG/ML IJ SOLN: 40.0000 mg | Freq: Every day | INTRAMUSCULAR | Status: DC

## 2021-07-18 MED ORDER — INSULIN ASPART 100 UNIT/ML IJ SOLN
0.0000 [IU] | Freq: Three times a day (TID) | INTRAMUSCULAR | Status: DC
  Administered 2021-07-18 – 2021-07-19 (×2): 7 [IU] via SUBCUTANEOUS
  Administered 2021-07-19 (×2): 3 [IU] via SUBCUTANEOUS
  Administered 2021-07-20 (×2): 4 [IU] via SUBCUTANEOUS
  Filled 2021-07-18 (×7): qty 1

## 2021-07-18 MED ORDER — FOLIC ACID 1 MG PO TABS: 1.0000 mg | ORAL_TABLET | Freq: Every day | ORAL | Status: DC

## 2021-07-18 MED ORDER — HYDROMORPHONE HCL 2 MG PO TABS
2.0000 mg | ORAL_TABLET | ORAL | Status: DC | PRN
  Administered 2021-07-18: 2 mg via ORAL
  Filled 2021-07-18: qty 1

## 2021-07-18 MED ORDER — IOHEXOL 300 MG/ML  SOLN
100.0000 mL | Freq: Once | INTRAMUSCULAR | Status: DC | PRN
  Filled 2021-07-18: qty 100

## 2021-07-18 MED ORDER — SODIUM CHLORIDE 0.9% FLUSH
3.0000 mL | INTRAVENOUS | Status: DC | PRN
  Administered 2021-07-19: 3 mL via INTRAVENOUS

## 2021-07-18 MED ORDER — ACETAMINOPHEN 650 MG RE SUPP: 650.0000 mg | Freq: Four times a day (QID) | RECTAL | Status: DC | PRN

## 2021-07-18 MED ORDER — OMEGA-3-ACID ETHYL ESTERS 1 G PO CAPS
1.0000 | ORAL_CAPSULE | Freq: Every day | ORAL | Status: DC
  Filled 2021-07-18 (×3): qty 1

## 2021-07-18 MED ORDER — FUROSEMIDE 10 MG/ML IJ SOLN
40.0000 mg | Freq: Once | INTRAMUSCULAR | Status: AC
  Administered 2021-07-18: 40 mg via INTRAVENOUS
  Filled 2021-07-18: qty 4

## 2021-07-18 MED ORDER — CARVEDILOL 12.5 MG PO TABS
12.5000 mg | ORAL_TABLET | Freq: Two times a day (BID) | ORAL | Status: DC
  Administered 2021-07-20: 12.5 mg via ORAL
  Filled 2021-07-18 (×5): qty 1

## 2021-07-18 MED ORDER — ONDANSETRON HCL 4 MG PO TABS: 4.0000 mg | ORAL_TABLET | Freq: Four times a day (QID) | ORAL | Status: DC | PRN

## 2021-07-18 MED ORDER — ACETAMINOPHEN 325 MG PO TABS: 650.0000 mg | ORAL_TABLET | Freq: Four times a day (QID) | ORAL | Status: DC | PRN

## 2021-07-18 MED ORDER — PANTOPRAZOLE SODIUM 40 MG PO TBEC
40.0000 mg | DELAYED_RELEASE_TABLET | Freq: Two times a day (BID) | ORAL | Status: DC
  Administered 2021-07-18: 40 mg via ORAL
  Filled 2021-07-18 (×2): qty 1

## 2021-07-18 MED ORDER — POTASSIUM CHLORIDE CRYS ER 20 MEQ PO TBCR
40.0000 meq | EXTENDED_RELEASE_TABLET | Freq: Every day | ORAL | Status: DC
  Administered 2021-07-19 – 2021-07-20 (×2): 40 meq via ORAL
  Filled 2021-07-18 (×2): qty 2

## 2021-07-18 MED ORDER — DIGOXIN 125 MCG PO TABS
125.0000 ug | ORAL_TABLET | ORAL | Status: DC
  Administered 2021-07-19 – 2021-07-20 (×2): 125 ug via ORAL
  Filled 2021-07-18 (×2): qty 1

## 2021-07-18 NOTE — Progress Notes (Signed)
Patient admitted to room 245 at change of shift. Patient refused to answer admission questions stating, he has answered all these question  2-3 times already and he is tired of answering the same questions. Patient educated on the admission process and the need to collect baseline information form the patient to provide appropriate safe care. Patient rolled his eyes and said "I am just tired of answering all these questions"/ Patient them expressed several concerns about the ED staff. CN made aware. AC in to speak with patient. Will use a buddy system when providing care for the patient through this shift.  ? ?

## 2021-07-18 NOTE — Progress Notes (Signed)
Date and time results received: 07/18/21 2156  ?(use smartphrase ".now" to insert current time) ? ?Test: Troponin ?Critical Value: 533 ? ?Name of Provider Notified: Sharion Settler NP ? ?Orders Received? Or Actions Taken?:  Continue to monitor ?

## 2021-07-18 NOTE — ED Notes (Signed)
Lab called to add on procalcitonin, BNP, and trop.  ?

## 2021-07-18 NOTE — ED Triage Notes (Signed)
Pt comes into the ED via EMS from home with c/o chronic hx of SOB/abd pain, is on PRN home O2, states for the past week he has been having to use it all the time and thinks he is having a pancreatitis flare with abd pain. ? ?138/78 ?99%3L [place] ?CBG165 ?HR80 ?

## 2021-07-18 NOTE — Progress Notes (Signed)
Patient reporting he is having a difficult time breathing. Lung sound clear, RR is 18, no s/s of respirator distress. Per Patient, "when I take the oxygen out of my nose I have a hard time breathing and the oxygen is giving a false indication of oxygen circulating throughout his body". Patient stated when he goes to the [facility] hospital they put oxygen on him and says his sats are 98% but he has oxygen on. This RN reminded the patient that he is at [facility] and that he is requiring oxygen at this time and that it is not a good idea to remove the oxygen at this time.  ?

## 2021-07-18 NOTE — Assessment & Plan Note (Signed)
BMI 36.63 ?Complicates overall prognosis and care ?Lifestyle modification and exercise discussed with patient in detail ?

## 2021-07-18 NOTE — Assessment & Plan Note (Addendum)
--  last A1c 9.1, poorly controlled ?--cont home Humulin N as reduced 30u daily ?--SSI for now ?

## 2021-07-18 NOTE — ED Notes (Signed)
Pt states that he is having abd pain that is chronic, but it is worse than usual- pt is currently on 4L [place] and was easily winded when transferring from wheelchair to stretcher ?

## 2021-07-18 NOTE — Assessment & Plan Note (Addendum)
--  pt refused home Lexapro ?

## 2021-07-18 NOTE — ED Notes (Signed)
Report given to [facility] in [place] ?

## 2021-07-18 NOTE — Assessment & Plan Note (Addendum)
--  pt refused any beta blocker ?Continue digoxin ?--pt refused anticoagulation, d/c heparin gtt ?

## 2021-07-18 NOTE — Progress Notes (Signed)
Patient has an visitor at the bedside who the patient say is his son and that the son has autism and the patient is the caretaker of the son. Per the patient, there is nobody to come get the son nor is there anybody at home to look after the son. Per the patient, "they Staff are not able to care for patient or provide meals. Charge nurse made aware. AC made aware.  ?

## 2021-07-18 NOTE — H&P (Signed)
?History and Physical  ? ? ?Patient: Don Brown BOF:[id number] DOB: 26-Apr-1955 ?DOA: 07/18/2021 ?DOS: the patient was seen and examined on 07/18/2021 ?PCP: Charlsie Merles, MD  ?Patient coming from: Home ? ?Chief Complaint:  ?Chief Complaint  ?Patient presents with  ? Abdominal Pain  ? Shortness of Breath  ? ?HPI: Don Brown is a 66 y.o. male with medical history significant for chronic systolic heart failure with last known LVEF of 20 to 25% from a 2D echocardiogram which was done in 2015, history of paroxysmal A-fib, anxiety, depression, chronic pancreatitis, chronic respiratory failure on 2 L of oxygen as needed, diabetes mellitus type 2 who presents to the ER for evaluation of shortness of breath that has worsened over the last several weeks.  Patient states that he had problems with his medications and sounds like he was out of his diuretic for a period of time.  This was resumed by his primary care provider and he states that he has been taking it as recommended but notes that his urine output has decreased.  Shortness of breath is mostly with exertion and he denies having any chest pain.  He notes that he is currently using his oxygen more frequently and instead of as needed he has been on 2 L of oxygen continuous over the last several weeks.  He has bilateral lower extremity swelling with the right leg bigger than the left leg. ?He also complains of abdominal pain which is chronic and related to his history of pancreatitis and states that his pain is worse.  He rates his pain a 7 x 10 in intensity at its worst and it is nonradiating.  Denies having any associated nausea, no vomiting, no changes in his bowel habits, no fever, no chills, no headache, no blurred vision or any focal deficit. ?Review of Systems: As mentioned in the history of present illness. All other systems reviewed and are negative. ?Past Medical History:  ?Diagnosis Date  ? Anemia   ? Anxiety   ? Atrial fibrillation ([place])   ? Cervical  compression fracture ([facility])   ? Cervical disk disease requiring surgery  ? CHF (congestive heart failure) ([place])   ? Chronic pain   ? Chronic systolic heart failure ([place])   ? LVEF 40-45%  ? Degenerative joint disease   ? Depression   ? Essential hypertension, benign   ? Headache   ? "stress"  ? Heart murmur   ? History of blood transfusion 2007  ? "related to OR"  ? History of renal failure   ? Requiring hemodialysis in the past with normal renal function at this point  ? History of ventral hernia repair   ? Hyperlipidemia   ? MGUS (monoclonal gammopathy of unknown significance)   ? "or myelona" (05/07/2014)  ? Mitral valve prolapse   ? Morbid obesity ([place])   ? Myeloma ([place])   ? "or MGUS" (05/07/2014)  ? Narcotic dependence, episodic use ([place])   ? On home oxygen therapy   ? "2L prn" (05/07/2014)  ? Pancreatitis   ? 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 Respiratory failure ([place])   ? History of ventilatory-dependent respiratory failure and tracheostomy   ? Sleep apnea   ? "don't wear mask" (05/07/2014)  ? Type 2 diabetes mellitus ([place])   ? ?Past Surgical History:  ?Procedure Laterality Date  ? BONE MARROW BIOPSY  02/2014  ? CATARACT EXTRACTION  W/ INTRAOCULAR LENS  IMPLANT, BILATERAL Bilateral   ? CHOLECYSTECTOMY    ? HERNIA REPAIR    ? INSERTION OF MESH  10/2006  ? IR RADIOLOGIST EVAL & MGMT  08/18/2017  ? PANCREAS SURGERY    ? "placed stents; multiple pseudo cysts removal"  ? SKIN GRAFT    ? TRACHEOSTOMY  2007  ? VENTRAL HERNIA REPAIR    ? ?Social History:  reports that he has never smoked. He has never used smokeless tobacco. He reports that he does not drink alcohol and does not use drugs. ? ?Allergies  ?Allergen Reactions  ? Morphine And Related Nausea And Vomiting  ? ? ?Family History  ?Problem Relation Age of Onset  ? Lymphoma Father   ? Hypertension Mother   ? Diabetes Mother   ? ? ?Prior to  Admission medications   ?Medication Sig Start Date End Date Taking? Authorizing Provider  ?APPLE CIDER VINEGAR PO Take 1 tablet by mouth 3 (three) times daily.    [provider]  ?aspirin 81 MG tablet Take 81 mg by mouth daily before breakfast.     [provider]  ?benazepril (LOTENSIN) 10 MG tablet Take 10 mg by mouth daily. Take 1/2 tablet daily by mouth    [provider]  ?Carboxymethylcellulose Sod PF 0.5 % SOLN Apply 1 drop to eye 5 (five) times daily. 1 drop in each eye 5 times/day    [provider]  ?carvedilol (COREG) 25 MG tablet Take 25 mg by mouth 2 (two) times daily with a meal. Takes 1/2 tablet by mouth bid.    [provider]  ?Cholecalciferol 2000 units TABS Take 1 tablet by mouth daily.    [provider]  ?Continuous Blood Gluc Sensor (FREESTYLE LIBRE 2 SENSOR) MISC 1 Device by Does not apply route every 14 (fourteen) days. 02/20/21   Renato Shin, MD  ?Cyanocobalamin (VITAMIN B 12 PO) Take 1 tablet by mouth daily.     [provider]  ?digoxin (LANOXIN) 0.125 MG tablet Take 1 tablet (125 mcg total) by mouth every morning. 02/12/14   Eugenie Filler, MD  ?escitalopram (LEXAPRO) 10 MG tablet Take 10 mg by mouth daily.    [provider]  ?folic acid (FOLVITE) 1 MG tablet Take 1 tablet (1 mg total) by mouth daily. 05/17/13   Thurnell Lose, MD  ?furosemide (LASIX) 40 MG tablet Take 1 tablet (40 mg total) by mouth 2 (two) times daily. Hold for the next 3 days, then resume on 01/12/2015 ?Patient taking differently: Take 40 mg by mouth daily. Hold for the next 3 days, then resume on 01/12/2015 01/12/15   Elgergawy, Silver Huguenin, MD  ?gabapentin (NEURONTIN) 600 MG tablet Take 600 mg by mouth 3 (three) times daily.    [provider]  ?HYDROcodone-acetaminophen (NORCO/VICODIN) 5-325 MG tablet Take 1 tablet by mouth every 6 (six) hours as needed for moderate pain. 01/22/16   Duffy Bruce, MD  ?HYDROmorphone (DILAUDID) 2  MG tablet Take 2 mg by mouth every 4 (four) hours as needed for severe pain.    [provider]  ?Insulin NPH, Human,, Isophane, (HUMULIN N KWIKPEN) 100 UNIT/ML Kiwkpen Inject 75 Units into the skin every morning. And pen needles 1/day. Veteran says visual loss prevents him from using syringe and vial. 05/06/21   Renato Shin, MD  ?metoprolol succinate (TOPROL-XL) 50 MG 24 hr tablet Take 1 tablet (50 mg total) by mouth every morning. 02/12/14   Eugenie Filler, MD  ?  Misc Natural Products (GINSENG COMPLEX PO) Take 1 tablet by mouth daily.    [provider]  ?Omega-3 1000 MG CAPS Take 1 capsule by mouth daily.    [provider]  ?ondansetron (ZOFRAN ODT) 4 MG disintegrating tablet Take 1 tablet (4 mg total) every 8 (eight) hours as needed by mouth for nausea or vomiting. 02/08/17   Darel Hong, MD  ?ondansetron (ZOFRAN) 4 MG tablet Take 1 tablet (4 mg total) by mouth every 8 (eight) hours as needed for nausea or vomiting. 07/26/14   Dahlia Bailiff, PA-C  ?ondansetron (ZOFRAN) 4 MG tablet Take 1 tablet (4 mg total) by mouth every 8 (eight) hours as needed for nausea or vomiting. 01/22/16   Duffy Bruce, MD  ?OXcarbazepine (TRILEPTAL) 150 MG tablet Take 150 mg by mouth 2 (two) times daily. Take 2 tablets by mouth 3 times daily for headaches.    [provider]  ?Oxycodone HCl 10 MG TABS Take 1 tablet (10 mg total) every 6 (six) hours as needed by mouth. 02/08/17   Darel Hong, MD  ?Pancrelipase, Lip-Prot-Amyl, 24000-76000 units CPEP Take 24,000 Units by mouth 3 (three) times daily.    [provider]  ?pantoprazole (PROTONIX) 40 MG tablet Take 1 tablet (40 mg total) by mouth 2 (two) times daily. 05/17/13   Thurnell Lose, MD  ?potassium chloride SA (K-DUR,KLOR-CON) 20 MEQ tablet Take 2 tablets (40 mEq total) by mouth daily. ?Patient taking differently: Take 20 mEq by mouth daily. 02/12/14   Eugenie Filler, MD  ?sildenafil (VIAGRA) 100 MG tablet Take 100 mg by  mouth daily as needed for erectile dysfunction.    [provider]  ?simvastatin (ZOCOR) 80 MG tablet Take 80 mg by mouth daily.    [provider]  ?thiamine 100 MG tablet Take 1 tablet (100 mg

## 2021-07-18 NOTE — Assessment & Plan Note (Addendum)
Patient has a history of chronic abdominal pain secondary to chronic pancreatitis.  Lipase wnl on presentation, and CT a/p showed no finding to suggest acute pancreatitis. ?--pain med switched to oral dilaudid with increased frequency overnight ?Plan: ?--cont oral dilaudid at current dose ?--no IV pain meds ?--Creon PRN ?

## 2021-07-18 NOTE — ED Provider Notes (Signed)
? ?[facility] ?Provider Note ? ? ? Event Date/Time  ? First MD Initiated Contact with Patient 07/18/21 1129   ?  (approximate) ? ? ?History  ? ?Abdominal Pain and Shortness of Breath ? ? ?HPI ? ?Don Brown is a 66 y.o. male with past medical history of hypertension, CHF, obesity, chronic hypoxia, here with acute on chronic shortness of breath.  The patient states that for the last weeks, he has had progressive worsening shortness of breath.  He states that he has noticed increase swelling in his legs, as well as worsening dyspnea.  Was initially with exertion but is now present at rest.  He has had some associated abdominal swelling.  He states he has gained several pounds of weight despite adjusting his diet.  He did run out of Lasix 1 or 2 months ago, but has since been on it.  He states he has been taking it.  He does state he has not been peeing as much as usual.  Denies any other specific changes in his health.  Denies any fevers.  He has had a cough, occasionally productive of clear to white sputum but no hemoptysis.  Denies any leg pain, just swelling, right greater than left but this is not abnormal for him.  No history of DVT/PE.  He has chronic abdominal pain secondary to pancreatitis and feels like this is mildly flared up, although he has been able to tolerate p.o. ?  ? ? ?Physical Exam  ? ?Triage Vital Signs: ?ED Triage Vitals [07/18/21 1102]  ?Enc Vitals Group  ?   BP 139/67  ?   Pulse Rate 90  ?   Resp (!) 22  ?   Temp 99.2 ?F (37.3 ?C)  ?   Temp Source Oral  ?   SpO2 98 %  ?   Weight   ?   Height   ?   Head Circumference   ?   Peak Flow   ?   Pain Score   ?   Pain Loc   ?   Pain Edu?   ?   Excl. in [place]?   ? ? ?Most recent vital signs: ?Vitals:  ? 07/18/21 1200 07/18/21 1230  ?BP: 126/77 122/86  ?Pulse: 87 82  ?Resp:    ?Temp:    ?SpO2: 97% 98%  ? ? ? ?General: Awake, no distress.  ?CV:  Good peripheral perfusion.  Regular rate and rhythm.  No murmurs. ?Resp:  Normal effort.   Bibasilar Rales with slight tachypnea. ?Abd:  No distention.  Mild tenderness in the epigastric area. ?Other:  Bilateral lower extremity edema, right greater than left, 2+ pitting. ? ? ?ED Results / Procedures / Treatments  ? ?Labs ?(all labs ordered are listed, but only abnormal results are displayed) ?Labs Reviewed  ?COMPREHENSIVE METABOLIC PANEL - Abnormal; Notable for the following components:  ?    Result Value  ? Potassium 3.4 (*)   ? CO2 33 (*)   ? Glucose, Bld 133 (*)   ? Total Protein 8.2 (*)   ? Albumin 3.1 (*)   ? All other components within normal limits  ?CBC - Abnormal; Notable for the following components:  ? WBC 3.3 (*)   ? Hemoglobin 12.4 (*)   ? MCH 25.2 (*)   ? MCHC 29.4 (*)   ? All other components within normal limits  ?BRAIN NATRIURETIC PEPTIDE - Abnormal; Notable for the following components:  ? B Natriuretic Peptide 263.6 (*)   ?  All other components within normal limits  ?TROPONIN I (HIGH SENSITIVITY) - Abnormal; Notable for the following components:  ? Troponin I (High Sensitivity) 53 (*)   ? All other components within normal limits  ?RESP PANEL BY RT-PCR (FLU A&B, COVID) ARPGX2  ?LIPASE, BLOOD  ?PROCALCITONIN  ?URINALYSIS, ROUTINE W REFLEX MICROSCOPIC  ?DIGOXIN LEVEL  ?HIV ANTIBODY (ROUTINE TESTING W REFLEX)  ?DIGOXIN LEVEL  ?MAGNESIUM  ?BASIC METABOLIC PANEL  ?CBC  ?TROPONIN I (HIGH SENSITIVITY)  ? ? ? ?EKG ?Atrial fibrillation, ventricular rate 87.  QRS 100, QTc 447.  Nonspecific T wave changes.  No acute ST elevations or depressions. ? ? ?RADIOLOGY ?Chest x-ray: Bilateral atelectasis versus pneumonia ? ? ?I also independently reviewed and agree with radiologist interpretations. ? ? ?PROCEDURES: ? ?Critical Care performed: No ? ?.1-3 Lead EKG Interpretation ?Performed by: Duffy Bruce, MD ?Authorized by: Duffy Bruce, MD  ? ?  Interpretation: normal   ?  ECG rate:  70-90 ?  ECG rate assessment: normal   ?  Rhythm: sinus rhythm   ?  Ectopy: none   ?  Conduction: normal   ?Comments:   ?   Indication: SOB ? ? ? ?MEDICATIONS ORDERED IN ED: ?Medications  ?iohexol (OMNIPAQUE) 300 MG/ML solution 100 mL (has no administration in time range)  ?aspirin EC tablet 81 mg (has no administration in time range)  ?benazepril (LOTENSIN) tablet 10 mg (10 mg Oral Patient Refused/Not Given 07/18/21 1617)  ?carvedilol (COREG) tablet 25 mg (has no administration in time range)  ?digoxin (LANOXIN) tablet 125 mcg (has no administration in time range)  ?atorvastatin (LIPITOR) tablet 40 mg (40 mg Oral Patient Refused/Not Given 07/18/21 1617)  ?escitalopram (LEXAPRO) tablet 10 mg (has no administration in time range)  ?Pancrelipase (Lip-Prot-Amyl) 24000-76000 units CPEP 24,000 Units (has no administration in time range)  ?pantoprazole (PROTONIX) EC tablet 40 mg (has no administration in time range)  ?folic acid (FOLVITE) tablet 1 mg (has no administration in time range)  ?gabapentin (NEURONTIN) tablet 600 mg (has no administration in time range)  ?OXcarbazepine (TRILEPTAL) tablet 150 mg (has no administration in time range)  ?Cholecalciferol TABS 2,000 Units (2,000 Units Oral Patient Refused/Not Given 07/18/21 1618)  ?Omega-3 CAPS 1,000 mg (has no administration in time range)  ?potassium chloride SA (KLOR-CON M) CR tablet 40 mEq (has no administration in time range)  ?thiamine tablet 100 mg (has no administration in time range)  ?Carboxymethylcellulose Sod PF 0.5 % SOLN 1 drop (1 drop Ophthalmic Patient Refused/Not Given 07/18/21 1617)  ?furosemide (LASIX) injection 40 mg (has no administration in time range)  ?insulin aspart (novoLOG) injection 0-20 Units (has no administration in time range)  ?enoxaparin (LOVENOX) injection 40 mg (has no administration in time range)  ?sodium chloride flush (NS) 0.9 % injection 3 mL (has no administration in time range)  ?sodium chloride flush (NS) 0.9 % injection 3 mL (has no administration in time range)  ?0.9 %  sodium chloride infusion (has no administration in time range)   ?acetaminophen (TYLENOL) tablet 650 mg (has no administration in time range)  ?  Or  ?acetaminophen (TYLENOL) suppository 650 mg (has no administration in time range)  ?ondansetron (ZOFRAN) tablet 4 mg ( Oral See Alternative 07/18/21 1542)  ?  Or  ?ondansetron (ZOFRAN) injection 4 mg (4 mg Intravenous Given 07/18/21 1542)  ?HYDROmorphone (DILAUDID) tablet 2 mg (has no administration in time range)  ?HYDROmorphone (DILAUDID) injection 1 mg (1 mg Intravenous Given 07/18/21 1542)  ?HYDROmorphone (DILAUDID) tablet 4 mg (4  mg Oral Given 07/18/21 1224)  ?furosemide (LASIX) injection 40 mg (40 mg Intravenous Given 07/18/21 1250)  ? ? ? ?IMPRESSION / MDM / ASSESSMENT AND PLAN / ED COURSE  ?I reviewed the triage vital signs and the nursing notes. ?             ?               ? ? ?The patient is on the cardiac monitor to evaluate for evidence of arrhythmia and/or significant heart rate changes. ? ? ?Ddx:  ?CHF exacerbation, pneumonia, pneumothorax, atelectasis, deconditioning, ACS/ischemia, anemia ? ? ?MDM:  ?66 year old male with extensive past medical history including chronic heart failure, paroxysmal A-fib, chronic respiratory failure, here with shortness of breath and leg swelling.  Clinically, the patient appears hypervolemic with pitting edema and bilateral rails concerning for CHF exacerbation.  His last EF was 25% based on the last echocardiogram I could find in his records in 2015.  Chest x-ray shows bibasilar atelectasis and fluid overload.  Clinically, the patient has no fever, sputum production, or signs to suggest pneumonia.  Lab work was obtained, remarkable for no leukocytosis.  CMP is unremarkable with normal renal function.  Lipase is normal.  BMP elevated above baseline, troponin also slightly elevated consistent with CHF.  Procalcitonin negative.  Do not suspect infection.  We will plan to admit for diuresis and further management of his acute on chronic hypoxic respiratory failure.  He is on 3 to 4 L  chronically, although he is normally only on this as needed at his baseline per report. ? ? ?MEDICATIONS GIVEN IN ED: ?Medications  ?iohexol (OMNIPAQUE) 300 MG/ML solution 100 mL (has no administration in time range)  ?

## 2021-07-18 NOTE — Assessment & Plan Note (Addendum)
Patient presents for evaluation of worsening shortness of breath associated with hypoxia ?He is on 2 L of oxygen as needed at home but over the last several weeks has required continuous oxygen supplementation ?--LVEF 20-25%, same as Echo in 2015.  Has not followed up with cardiology.  Has been intentional non-compliant with his cardiac mes; has not been taking home coreg, and refuses to take any other forms of beta blockers; ran out of diuretics for weeks. ?--cardiology consulted.   ?--Patient declined any ischemic work-up or left heart cath ?Plan: ?--cont IV lasix 80 mg BID, per cardio ?--Continue coreg and benazepril ?

## 2021-07-18 NOTE — ED Notes (Signed)
Patient refusing labs and vitals updated until pharmacy comes down to verify meds. ?

## 2021-07-18 NOTE — ED Notes (Addendum)
At bedside for this pt , pt requesting pain meds and creon medication reports abd pain 10/10.  Pt  advised his medications will need to be verified by pharmacist before given.  This RN has requested ED pharmacist to have this pt's meds verified  but stated Pt had been rude to him earlier so he will have to get back to him later once done with other patients.  Explained to patient some due labs will have to be taken for him as it was pending since 2PM and he has initially refused to have labs taken per RN notes, however Pt has been coy about it and verbalizes this RN just making up stories. Pt then agreed to have labs drawn as soon as dilaudid was given and transport team in room waiting for this RN to finnish giving medication for this Pt ?

## 2021-07-19 DIAGNOSIS — I429 Cardiomyopathy, unspecified: Secondary | ICD-10-CM

## 2021-07-19 DIAGNOSIS — J9621 Acute and chronic respiratory failure with hypoxia: Secondary | ICD-10-CM

## 2021-07-19 DIAGNOSIS — Z91148 Patient's other noncompliance with medication regimen for other reason: Secondary | ICD-10-CM

## 2021-07-19 DIAGNOSIS — I4821 Permanent atrial fibrillation: Secondary | ICD-10-CM

## 2021-07-19 DIAGNOSIS — I5023 Acute on chronic systolic (congestive) heart failure: Secondary | ICD-10-CM | POA: Diagnosis not present

## 2021-07-19 LAB — GLUCOSE, CAPILLARY
Glucose-Capillary: 134 mg/dL — ABNORMAL HIGH (ref 70–99)
Glucose-Capillary: 138 mg/dL — ABNORMAL HIGH (ref 70–99)
Glucose-Capillary: 144 mg/dL — ABNORMAL HIGH (ref 70–99)
Glucose-Capillary: 179 mg/dL — ABNORMAL HIGH (ref 70–99)
Glucose-Capillary: 204 mg/dL — ABNORMAL HIGH (ref 70–99)
Glucose-Capillary: 233 mg/dL — ABNORMAL HIGH (ref 70–99)

## 2021-07-19 LAB — CBC
HCT: 42.2 % (ref 39.0–52.0)
Hemoglobin: 12.2 g/dL — ABNORMAL LOW (ref 13.0–17.0)
MCH: 25.5 pg — ABNORMAL LOW (ref 26.0–34.0)
MCHC: 28.9 g/dL — ABNORMAL LOW (ref 30.0–36.0)
MCV: 88.1 fL (ref 80.0–100.0)
Platelets: 170 10*3/uL (ref 150–400)
RBC: 4.79 MIL/uL (ref 4.22–5.81)
RDW: 14.9 % (ref 11.5–15.5)
WBC: 5.1 10*3/uL (ref 4.0–10.5)
nRBC: 0 % (ref 0.0–0.2)

## 2021-07-19 LAB — BASIC METABOLIC PANEL
Anion gap: 5 (ref 5–15)
BUN: 15 mg/dL (ref 8–23)
CO2: 39 mmol/L — ABNORMAL HIGH (ref 22–32)
Calcium: 8.7 mg/dL — ABNORMAL LOW (ref 8.9–10.3)
Chloride: 96 mmol/L — ABNORMAL LOW (ref 98–111)
Creatinine, Ser: 0.97 mg/dL (ref 0.61–1.24)
GFR, Estimated: >60 mL/min (ref >60)
Glucose, Bld: 146 mg/dL — ABNORMAL HIGH (ref 70–99)
Potassium: 4.6 mmol/L (ref 3.5–5.1)
Sodium: 140 mmol/L (ref 135–145)

## 2021-07-19 LAB — ECHOCARDIOGRAM COMPLETE
Height: 72 in
P 1/2 time: 283 msec
S' Lateral: 5 cm
Weight: 4321.02 oz

## 2021-07-19 LAB — TROPONIN I (HIGH SENSITIVITY)
Troponin I (High Sensitivity): 669 ng/L (ref <18)
Troponin I (High Sensitivity): 572 ng/L (ref <18)

## 2021-07-19 LAB — HEPARIN LEVEL (UNFRACTIONATED)
Heparin Unfractionated: 0.45 IU/mL (ref 0.30–0.70)
Heparin Unfractionated: 0.39 IU/mL (ref 0.30–0.70)

## 2021-07-19 LAB — HIV ANTIBODY (ROUTINE TESTING W REFLEX): HIV Screen 4th Generation wRfx: NONREACTIVE

## 2021-07-19 MED ORDER — OXYCODONE HCL 5 MG PO TABS
10.0000 mg | ORAL_TABLET | Freq: Four times a day (QID) | ORAL | Status: DC | PRN
  Administered 2021-07-19 (×2): 10 mg via ORAL
  Filled 2021-07-19 (×3): qty 2

## 2021-07-19 MED ORDER — PANCRELIPASE (LIP-PROT-AMYL) 12000-38000 UNITS PO CPEP: 24000.0000 [IU] | ORAL_CAPSULE | Freq: Three times a day (TID) | ORAL | Status: DC | PRN

## 2021-07-19 MED ORDER — FUROSEMIDE 10 MG/ML IJ SOLN
80.0000 mg | Freq: Two times a day (BID) | INTRAMUSCULAR | Status: DC
  Administered 2021-07-19 – 2021-07-20 (×3): 80 mg via INTRAVENOUS
  Filled 2021-07-19 (×3): qty 8

## 2021-07-19 MED ORDER — METOPROLOL TARTRATE 5 MG/5ML IV SOLN
INTRAVENOUS | Status: AC
  Filled 2021-07-19: qty 5

## 2021-07-19 MED ORDER — HEPARIN (PORCINE) 25000 UT/250ML-% IV SOLN
1400.0000 [IU]/h | INTRAVENOUS | Status: DC
  Administered 2021-07-19: 1400 [IU]/h via INTRAVENOUS
  Filled 2021-07-19 (×2): qty 250

## 2021-07-19 MED ORDER — FUROSEMIDE 10 MG/ML IJ SOLN
40.0000 mg | Freq: Once | INTRAMUSCULAR | Status: AC
  Administered 2021-07-19: 40 mg via INTRAVENOUS
  Filled 2021-07-19: qty 4

## 2021-07-19 MED ORDER — METOPROLOL TARTRATE 5 MG/5ML IV SOLN
5.0000 mg | Freq: Once | INTRAVENOUS | Status: AC
  Administered 2021-07-19: 5 mg via INTRAVENOUS

## 2021-07-19 MED ORDER — FUROSEMIDE 10 MG/ML IJ SOLN
40.0000 mg | Freq: Two times a day (BID) | INTRAMUSCULAR | Status: DC
  Administered 2021-07-19: 40 mg via INTRAVENOUS
  Filled 2021-07-19: qty 4

## 2021-07-19 MED ORDER — HYDROMORPHONE HCL 2 MG PO TABS
2.0000 mg | ORAL_TABLET | ORAL | Status: DC | PRN
  Administered 2021-07-19 – 2021-07-20 (×5): 2 mg via ORAL
  Filled 2021-07-19 (×5): qty 1

## 2021-07-19 MED ORDER — INSULIN DETEMIR 100 UNIT/ML ~~LOC~~ SOLN
30.0000 [IU] | SUBCUTANEOUS | Status: DC
  Administered 2021-07-20: 30 [IU] via SUBCUTANEOUS
  Filled 2021-07-19: qty 0.3

## 2021-07-19 NOTE — Progress Notes (Signed)
Cross Cover ?Patient refusing IV heparin and cardiac monitoring. May need palliative care consult to discuss goals of care ?

## 2021-07-19 NOTE — Progress Notes (Signed)
Hep drip started. Will collect 12lead ECG. Patient denied any chest pain at this time. Call light kept within reach. Patient's provided with recliner chair. Offer and blanket and pillow but refused.  ?

## 2021-07-19 NOTE — Progress Notes (Signed)
RN called to pt room to take out his IV and pt sitting on the side of the bed.  Pt then starts to fall to the side and appears to become "unresponsive".  Charge RN called to room and assisted pt to get in bed.  Vitals and CBG obtained.  Pt is then able to communicate and is refusing cardiac monitoring.  MD notified.  ?

## 2021-07-19 NOTE — Progress Notes (Signed)
?   07/19/21 0223  ?Assess: MEWS Score  ?Temp 98.9 ?F (37.2 ?C)  ?BP (!) 147/90  ?Pulse Rate 96  ?ECG Heart Rate 94  ?Resp 16  ?Level of Consciousness Alert  ?SpO2 95 %  ?O2 Device Nasal Cannula  ?O2 Flow Rate (L/min) 2 L/min  ?Assess: MEWS Score  ?MEWS Temp 0  ?MEWS Systolic 0  ?MEWS Pulse 0  ?MEWS RR 0  ?MEWS LOC 0  ?MEWS Score 0  ?MEWS Score Color Green  ?Treat  ?Pain Scale 0-10  ?Pain Score 0  ?Assess: SIRS CRITERIA  ?SIRS Temperature  0  ?SIRS Pulse 1  ?SIRS Respirations  0  ?SIRS WBC 0  ?SIRS Score Sum  1  ? ? ?

## 2021-07-19 NOTE — Assessment & Plan Note (Signed)
--  intentional. ?

## 2021-07-19 NOTE — Progress Notes (Signed)
ANTICOAGULATION CONSULT NOTE -  ? ?Pharmacy Consult for Heparin  ?Indication: chest pain/ACS ? ?Allergies  ?Allergen Reactions  ? Morphine And Related Nausea And Vomiting  ? ? ?Patient Measurements: ?Height: 6' (182.9 cm) ?Weight: 132 kg (291 lb 0.1 oz) ?IBW/kg (Calculated) : 77.6 ?Heparin Dosing Weight: 104 kg  ? ?Vital Signs: ?Temp: 97.7 ?F (36.5 ?C) (04/29 6606) ?Temp Source: Oral (04/29 0419) ?BP: 144/82 (04/29 0743) ?Pulse Rate: 102 (04/29 0743) ? ?Labs: ?Recent Labs  ?  07/18/21 ?1107 07/18/21 ?2016 07/19/21 ?0127 07/19/21 ?3016 07/19/21 ?0109  ?HGB 12.4*  --  12.2*  --   --   ?HCT 42.2  --  42.2  --   --   ?PLT 156  --  170  --   --   ?HEPARINUNFRC  --   --   --   --  0.45  ?CREATININE 0.74  --  0.97  --   --   ?TROPONINIHS 53* 533* 669* 572*  --   ? ? ? ?Estimated Creatinine Clearance: 105.3 mL/min (by C-G formula based on SCr of 0.97 mg/dL). ? ? ?Medical History: ?Past Medical History:  ?Diagnosis Date  ? Anemia   ? Anxiety   ? Atrial fibrillation ([place])   ? Cervical compression fracture (HCC)   ? Cervical disk disease requiring surgery  ? CHF (congestive heart failure) ([place])   ? Chronic pain   ? Chronic systolic heart failure ([place])   ? LVEF 40-45%  ? Degenerative joint disease   ? Depression   ? Essential hypertension, benign   ? Headache   ? "stress"  ? Heart murmur   ? History of blood transfusion 2007  ? "related to OR"  ? History of renal failure   ? Requiring hemodialysis in the past with normal renal function at this point  ? History of ventral hernia repair   ? Hyperlipidemia   ? MGUS (monoclonal gammopathy of unknown significance)   ? "or myelona" (05/07/2014)  ? Mitral valve prolapse   ? Morbid obesity ([place])   ? Myeloma ([place])   ? "or MGUS" (05/07/2014)  ? Narcotic dependence, episodic use ([place])   ? On home oxygen therapy   ? "2L prn" (05/07/2014)  ? Pancreatitis   ? 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 Respiratory failure ([place])   ? History of ventilatory-dependent respiratory failure and tracheostomy   ? Sleep apnea   ? "don't wear mask" (05/07/2014)  ? Type 2 diabetes mellitus ([place])   ? ? ?Medications:  ?Medications Prior to Admission  ?Medication Sig Dispense Refill Last Dose  ? aspirin 81 MG tablet Take 81 mg by mouth daily before breakfast.    07/18/2021  ? benazepril (LOTENSIN) 10 MG tablet Take 10 mg by mouth daily. Take 1/2 tablet daily by mouth   07/18/2021  ? carvedilol (COREG) 25 MG tablet Take 25 mg by mouth daily. Takes 1/2 tablet by mouth bid.   07/17/2021  ? Cholecalciferol 2000 units TABS Take 1 tablet by mouth daily.   07/17/2021  ? Cyanocobalamin (VITAMIN B 12 PO) Take 1 tablet by mouth daily.    07/18/2021  ? digoxin (LANOXIN) 0.125 MG tablet Take 1 tablet (125 mcg total) by mouth every morning. 31 tablet 0 07/18/2021  ? insulin aspart (NOVOLOG) 100 UNIT/ML injection Inject into the skin 3 (three) times daily before meals.   07/18/2021  ? potassium chloride SA (  K-DUR,KLOR-CON) 20 MEQ tablet Take 2 tablets (40 mEq total) by mouth daily. (Patient taking differently: Take 20 mEq by mouth daily.) 62 tablet 0 Past Week  ? thiamine 100 MG tablet Take 1 tablet (100 mg total) by mouth daily. 30 tablet 0 Past Week  ? APPLE CIDER VINEGAR PO Take 1 tablet by mouth 3 (three) times daily.     ? Carboxymethylcellulose Sod PF 0.5 % SOLN Apply 1 drop to eye 5 (five) times daily. 1 drop in each eye 5 times/day     ? Continuous Blood Gluc Sensor (FREESTYLE LIBRE 2 SENSOR) MISC 1 Device by Does not apply route every 14 (fourteen) days. 6 each 3   ? escitalopram (LEXAPRO) 10 MG tablet Take 10 mg by mouth daily.     ? folic acid (FOLVITE) 1 MG tablet Take 1 tablet (1 mg total) by mouth daily. (Patient not taking: Reported on 07/18/2021) 30 tablet 0 Not Taking  ? furosemide (LASIX) 40 MG tablet Take 1 tablet (40 mg total) by mouth 2 (two) times daily. Hold for the next 3 days, then resume on  01/12/2015 (Patient taking differently: Take 40 mg by mouth daily.) 30 tablet    ? gabapentin (NEURONTIN) 600 MG tablet Take 600 mg by mouth 3 (three) times daily.     ? HYDROmorphone (DILAUDID) 2 MG tablet Take 2 mg by mouth every 4 (four) hours as needed for severe pain.     ? Insulin NPH, Human,, Isophane, (HUMULIN N KWIKPEN) 100 UNIT/ML Kiwkpen Inject 75 Units into the skin every morning. And pen needles 1/day. Veteran says visual loss prevents him from using syringe and vial. 75 mL 3   ? metoprolol succinate (TOPROL-XL) 50 MG 24 hr tablet Take 1 tablet (50 mg total) by mouth every morning. (Patient not taking: Reported on 07/18/2021) 30 tablet 0 Not Taking  ? Misc Natural Products (GINSENG COMPLEX PO) Take 1 tablet by mouth daily.     ? Omega-3 1000 MG CAPS Take 1 capsule by mouth daily.     ? ondansetron (ZOFRAN ODT) 4 MG disintegrating tablet Take 1 tablet (4 mg total) every 8 (eight) hours as needed by mouth for nausea or vomiting. (Patient not taking: Reported on 07/18/2021) 20 tablet 0 Not Taking  ? OXcarbazepine (TRILEPTAL) 150 MG tablet Take 150 mg by mouth 2 (two) times daily. Take 2 tablets by mouth 3 times daily for headaches.     ? Oxycodone HCl 10 MG TABS Take 1 tablet (10 mg total) every 6 (six) hours as needed by mouth. 9 tablet 0   ? Pancrelipase, Lip-Prot-Amyl, 24000-76000 units CPEP Take 24,000 Units by mouth 3 (three) times daily.     ? sildenafil (VIAGRA) 100 MG tablet Take 100 mg by mouth daily as needed for erectile dysfunction.     ? simvastatin (ZOCOR) 80 MG tablet Take 80 mg by mouth daily.     ? triamcinolone cream (KENALOG) 0.1 % Apply 1 application topically 2 (two) times daily. Apply as directed to affected area bid.     ? TURMERIC PO Take 1 tablet by mouth daily.     ? ? ?Assessment: ?Pharmacy consulted to dose heparin in this 66 year old male admitted with CHF exacerbation, now with ACS/NSTEMI.  Pt received lovenox 40 mg SQ x 1 @ 2100. ?CrCl = 122.8 ml/min  ? ?4/29 0721  HL=0.45 Therapeutic, cont 1400 u/hr ? ?Goal of Therapy:  ?Heparin level 0.3-0.7 units/ml ?Monitor platelets by anticoagulation protocol: Yes ?  ?  Plan:  ?4/29 0721 HL=0.45 Therapeutic, continue Heparin drip at 1400 units/hr. ?Check confirmatory HL in 6 hrs ?CBC daily ? ?Don Brown ?07/19/2021,8:18 AM ? ? ?

## 2021-07-19 NOTE — Progress Notes (Signed)
Cross Cover ?Initial repeat troponin increased 53 to 533.  ?Repeat EKG - a fib with v rate 96 ?Heparin drip initiated and repeat troponins ordered which continued to trend up 663 ?Developed RVR around 0200 treated with IV metoprolol ? ?Day team to initiate cardiology consult if needed ?

## 2021-07-19 NOTE — Progress Notes (Signed)
Patient finally able to get some rest. Chaplain called for a prayer per patient's request. When Chaplain arrived patient informed that he just fell asleep and requested if Chaplain can come back later to pray with him.  ?

## 2021-07-19 NOTE — Consult Note (Addendum)
?Cardiology Consultation:  ? ?Patient ID: [name] ?MRN: [id number]; DOB: 1955/08/09 ? ?Admit date: 07/18/2021 ?Date of Consult: 07/19/2021 ? ?PCP:  Charlsie Merles, MD ?  ?[facility] Providers ?Cardiologist:  [facility] ?Click here to update MD or APP on Care Team, Refresh:1}   ? ? ?Patient Profile:  ? ?Don Brown is a 66 y.o. male with a hx of atrial fibrillation, cardiomyopathy EF 25%, hypertension who is being seen 07/19/2021 for the evaluation of shortness of breath at the request of Dr. Billie Ruddy. ? ?History of Present Illness:  ? ?Mr. Don Brown is a 66 year old male with history of cardiomyopathy EF 25%, permanent atrial fibrillation, hypertension, respiratory failure on home oxygen, diabetes who presents with worsening shortness of breath. ? ?Patient has noticed worsening shortness of breath ongoing over the past month or so.  States there was medication adjustment issues prompting him to stop taking his meds for about a week.  Upon restarting, he takes Lasix 40 mg daily over the past 4 weeks.   ? ?He follows up at the [facility] for his medical care, a stress test was recommended but patient declined.  Patient states an x-ray was performed which did not show any blockage in his heart arteries.  He denies any previous cardiac catheterization or stress test to evaluate ischemia.  Stop taking Coreg due to erectile dysfunction and other side effects.  Would not want to start taking any other beta-blockers.  Patient also would not want any invasive procedures done such as left heart cath.  He stopped taking his statin because he found out that this can cause diabetes. ? ?In the ED, EKG showed atrial fibrillation heart rate 87, troponins were elevated at 529, 663.  Started on heparin drip for both elevated troponins and also atrial fibrillation. ? ?He states not wanting any blood thinners due to complicated abdominal surgery in the past leading to ostomy oozing and taking too long to recover.  Patient started on  Lasix. ? ? ?Past Medical History:  ?Diagnosis Date  ? Anemia   ? Anxiety   ? Atrial fibrillation ([place])   ? Cervical compression fracture (HCC)   ? Cervical disk disease requiring surgery  ? CHF (congestive heart failure) ([place])   ? Chronic pain   ? Chronic systolic heart failure ([place])   ? LVEF 40-45%  ? Degenerative joint disease   ? Depression   ? Essential hypertension, benign   ? Headache   ? "stress"  ? Heart murmur   ? History of blood transfusion 2007  ? "related to OR"  ? History of renal failure   ? Requiring hemodialysis in the past with normal renal function at this point  ? History of ventral hernia repair   ? Hyperlipidemia   ? MGUS (monoclonal gammopathy of unknown significance)   ? "or myelona" (05/07/2014)  ? Mitral valve prolapse   ? Morbid obesity ([name])   ? Myeloma ([place])   ? "or MGUS" (05/07/2014)  ? Narcotic dependence, episodic use ([place])   ? On home oxygen therapy   ? "2L prn" (05/07/2014)  ? Pancreatitis   ? 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 Respiratory failure ([place])   ? History of ventilatory-dependent respiratory failure and tracheostomy   ? Sleep apnea   ? "don't wear mask" (05/07/2014)  ? Type 2 diabetes mellitus ([place])   ? ? ?Past Surgical History:  ?Procedure Laterality  Date  ? BONE MARROW BIOPSY  02/2014  ? CATARACT EXTRACTION W/ INTRAOCULAR LENS  IMPLANT, BILATERAL Bilateral   ? CHOLECYSTECTOMY    ? HERNIA REPAIR    ? INSERTION OF MESH  10/2006  ? IR RADIOLOGIST EVAL & MGMT  08/18/2017  ? PANCREAS SURGERY    ? "placed stents; multiple pseudo cysts removal"  ? SKIN GRAFT    ? TRACHEOSTOMY  2007  ? VENTRAL HERNIA REPAIR    ?  ? ?Home Medications:  ?Prior to Admission medications   ?Medication Sig Start Date End Date Taking? Authorizing Provider  ?aspirin 81 MG tablet Take 81 mg by mouth daily before breakfast.    Yes [provider]  ?benazepril (LOTENSIN) 10 MG  tablet Take 10 mg by mouth daily. Take 1/2 tablet daily by mouth   Yes [provider]  ?carvedilol (COREG) 25 MG tablet Take 25 mg by mouth daily. Takes 1/2 tablet by mouth bid.   Yes [provider]  ?Cholecalciferol 2000 units TABS Take 1 tablet by mouth daily.   Yes [provider]  ?Cyanocobalamin (VITAMIN B 12 PO) Take 1 tablet by mouth daily.    Yes [provider]  ?digoxin (LANOXIN) 0.125 MG tablet Take 1 tablet (125 mcg total) by mouth every morning. 02/12/14  Yes Eugenie Filler, MD  ?gabapentin (NEURONTIN) 600 MG tablet Take 600 mg by mouth 3 (three) times daily.   Yes [provider]  ?insulin aspart (NOVOLOG) 100 UNIT/ML injection Inject into the skin 3 (three) times daily before meals.   Yes [provider]  ?Insulin NPH, Human,, Isophane, (HUMULIN N KWIKPEN) 100 UNIT/ML Kiwkpen Inject 75 Units into the skin every morning. And pen needles 1/day. Veteran says visual loss prevents him from using syringe and vial. 05/06/21  Yes Renato Shin, MD  ?potassium chloride SA (K-DUR,KLOR-CON) 20 MEQ tablet Take 2 tablets (40 mEq total) by mouth daily. ?Patient taking differently: Take 20 mEq by mouth daily. 02/12/14  Yes Eugenie Filler, MD  ?thiamine 100 MG tablet Take 1 tablet (100 mg total) by mouth daily. 05/17/13  Yes Thurnell Lose, MD  ?APPLE CIDER VINEGAR PO Take 1 tablet by mouth 3 (three) times daily.    [provider]  ?Carboxymethylcellulose Sod PF 0.5 % SOLN Apply 1 drop to eye 5 (five) times daily. 1 drop in each eye 5 times/day    [provider]  ?Continuous Blood Gluc Sensor (FREESTYLE LIBRE 2 SENSOR) MISC 1 Device by Does not apply route every 14 (fourteen) days. 02/20/21   Renato Shin, MD  ?escitalopram (LEXAPRO) 10 MG tablet Take 10 mg by mouth daily. ?Patient not taking: Reported on 07/19/2021    [provider]  ?folic acid (FOLVITE) 1 MG tablet Take 1 tablet (1 mg total) by mouth daily. ?Patient not  taking: Reported on 07/18/2021 05/17/13   Thurnell Lose, MD  ?furosemide (LASIX) 40 MG tablet Take 1 tablet (40 mg total) by mouth 2 (two) times daily. Hold for the next 3 days, then resume on 01/12/2015 ?Patient taking differently: Take 40 mg by mouth daily. 01/12/15   Elgergawy, Silver Huguenin, MD  ?HYDROmorphone (DILAUDID) 2 MG tablet Take 2 mg by mouth every 4 (four) hours as needed for severe pain.    [provider]  ?metoprolol succinate (TOPROL-XL) 50 MG 24 hr tablet Take 1 tablet (50 mg total) by mouth every morning. ?Patient not taking: Reported on 07/18/2021 02/12/14   Eugenie Filler, MD  ?  Misc Natural Products (GINSENG COMPLEX PO) Take 1 tablet by mouth daily.    [provider]  ?Omega-3 1000 MG CAPS Take 1 capsule by mouth daily.    [provider]  ?ondansetron (ZOFRAN ODT) 4 MG disintegrating tablet Take 1 tablet (4 mg total) every 8 (eight) hours as needed by mouth for nausea or vomiting. ?Patient not taking: Reported on 07/18/2021 02/08/17   Darel Hong, MD  ?OXcarbazepine (TRILEPTAL) 150 MG tablet Take 150 mg by mouth 2 (two) times daily. Take 2 tablets by mouth 3 times daily for headaches.    [provider]  ?Oxycodone HCl 10 MG TABS Take 1 tablet (10 mg total) every 6 (six) hours as needed by mouth. 02/08/17   Darel Hong, MD  ?Pancrelipase, Lip-Prot-Amyl, 24000-76000 units CPEP Take 24,000 Units by mouth 3 (three) times daily.    [provider]  ?sildenafil (VIAGRA) 100 MG tablet Take 100 mg by mouth daily as needed for erectile dysfunction.    [provider]  ?simvastatin (ZOCOR) 80 MG tablet Take 80 mg by mouth daily. ?Patient not taking: Reported on 07/19/2021    [provider]  ?triamcinolone cream (KENALOG) 0.1 % Apply 1 application topically 2 (two) times daily. Apply as directed to affected area bid.    [provider]  ?TURMERIC PO Take 1 tablet by mouth daily.    [provider]  ? ? ?Inpatient  Medications: ?Scheduled Meds: ? aspirin EC  81 mg Oral QAC breakfast  ? benazepril  10 mg Oral Daily  ? carvedilol  12.5 mg Oral BID WC  ? digoxin  125 mcg Oral [id number]  ? furosemide  80 mg Intravenous BID  ? in

## 2021-07-19 NOTE — Assessment & Plan Note (Signed)
--  on baseline 2L PRN, now needing 2L continuously ?--CT chest showed increase in patchy infiltrates in both lower lung.  No leukocytosis, no fever, procal neg, so unlikely to be bacterial PNA.  Clinical pic more consistent with fluid overload.  Pt complained of a month-long cough, however, refused to be swabbed for COVID test. ?Plan: ?--treat CHF exacerbation ?--Continue supplemental O2 to keep sats >=90%, wean as tolerated ?

## 2021-07-19 NOTE — Progress Notes (Signed)
ANTICOAGULATION CONSULT NOTE - Initial Consult ? ?Pharmacy Consult for Heparin  ?Indication: chest pain/ACS ? ?Allergies  ?Allergen Reactions  ? Morphine And Related Nausea And Vomiting  ? ? ?Patient Measurements: ?Height: 6' (182.9 cm) ?Weight: 122.5 kg (270 lb 1 oz) ?IBW/kg (Calculated) : 77.6 ?Heparin Dosing Weight: 104 kg  ? ?Vital Signs: ?Temp: 98 ?F (36.7 ?C) (04/28 1906) ?BP: 135/77 (04/28 1906) ?Pulse Rate: 105 (04/28 2252) ? ?Labs: ?Recent Labs  ?  07/18/21 ?1107 07/18/21 ?2016  ?HGB 12.4*  --   ?HCT 42.2  --   ?PLT 156  --   ?CREATININE 0.74  --   ?TROPONINIHS 53* 533*  ? ? ?Estimated Creatinine Clearance: 122.8 mL/min (by C-G formula based on SCr of 0.74 mg/dL). ? ? ?Medical History: ?Past Medical History:  ?Diagnosis Date  ? Anemia   ? Anxiety   ? Atrial fibrillation ([place])   ? Cervical compression fracture (HCC)   ? Cervical disk disease requiring surgery  ? CHF (congestive heart failure) ([place])   ? Chronic pain   ? Chronic systolic heart failure ([place])   ? LVEF 40-45%  ? Degenerative joint disease   ? Depression   ? Essential hypertension, benign   ? Headache   ? "stress"  ? Heart murmur   ? History of blood transfusion 2007  ? "related to OR"  ? History of renal failure   ? Requiring hemodialysis in the past with normal renal function at this point  ? History of ventral hernia repair   ? Hyperlipidemia   ? MGUS (monoclonal gammopathy of unknown significance)   ? "or myelona" (05/07/2014)  ? Mitral valve prolapse   ? Morbid obesity ([place])   ? Myeloma ([place])   ? "or MGUS" (05/07/2014)  ? Narcotic dependence, episodic use ([place])   ? On home oxygen therapy   ? "2L prn" (05/07/2014)  ? Pancreatitis   ? 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 Respiratory failure ([place])   ? History of ventilatory-dependent respiratory failure and tracheostomy   ? Sleep apnea   ? "don't wear mask" (05/07/2014)  ? Type  2 diabetes mellitus ([place])   ? ? ?Medications:  ?Medications Prior to Admission  ?Medication Sig Dispense Refill Last Dose  ? aspirin 81 MG tablet Take 81 mg by mouth daily before breakfast.    07/18/2021  ? benazepril (LOTENSIN) 10 MG tablet Take 10 mg by mouth daily. Take 1/2 tablet daily by mouth   07/18/2021  ? carvedilol (COREG) 25 MG tablet Take 25 mg by mouth daily. Takes 1/2 tablet by mouth bid.   07/17/2021  ? Cholecalciferol 2000 units TABS Take 1 tablet by mouth daily.   07/17/2021  ? Cyanocobalamin (VITAMIN B 12 PO) Take 1 tablet by mouth daily.    07/18/2021  ? digoxin (LANOXIN) 0.125 MG tablet Take 1 tablet (125 mcg total) by mouth every morning. 31 tablet 0 07/18/2021  ? insulin aspart (NOVOLOG) 100 UNIT/ML injection Inject into the skin 3 (three) times daily before meals.   07/18/2021  ? potassium chloride SA (K-DUR,KLOR-CON) 20 MEQ tablet Take 2 tablets (40 mEq total) by mouth daily. (Patient taking differently: Take 20 mEq by mouth daily.) 62 tablet 0 Past Week  ? thiamine 100 MG tablet Take 1 tablet (100 mg total) by mouth daily. 30 tablet 0 Past Week  ? APPLE CIDER VINEGAR PO Take 1 tablet by mouth 3 (  three) times daily.     ? Carboxymethylcellulose Sod PF 0.5 % SOLN Apply 1 drop to eye 5 (five) times daily. 1 drop in each eye 5 times/day     ? Continuous Blood Gluc Sensor (FREESTYLE LIBRE 2 SENSOR) MISC 1 Device by Does not apply route every 14 (fourteen) days. 6 each 3   ? escitalopram (LEXAPRO) 10 MG tablet Take 10 mg by mouth daily.     ? folic acid (FOLVITE) 1 MG tablet Take 1 tablet (1 mg total) by mouth daily. (Patient not taking: Reported on 07/18/2021) 30 tablet 0 Not Taking  ? furosemide (LASIX) 40 MG tablet Take 1 tablet (40 mg total) by mouth 2 (two) times daily. Hold for the next 3 days, then resume on 01/12/2015 (Patient taking differently: Take 40 mg by mouth daily.) 30 tablet    ? gabapentin (NEURONTIN) 600 MG tablet Take 600 mg by mouth 3 (three) times daily.     ? HYDROmorphone  (DILAUDID) 2 MG tablet Take 2 mg by mouth every 4 (four) hours as needed for severe pain.     ? Insulin NPH, Human,, Isophane, (HUMULIN N KWIKPEN) 100 UNIT/ML Kiwkpen Inject 75 Units into the skin every morning. And pen needles 1/day. Veteran says visual loss prevents him from using syringe and vial. 75 mL 3   ? metoprolol succinate (TOPROL-XL) 50 MG 24 hr tablet Take 1 tablet (50 mg total) by mouth every morning. (Patient not taking: Reported on 07/18/2021) 30 tablet 0 Not Taking  ? Misc Natural Products (GINSENG COMPLEX PO) Take 1 tablet by mouth daily.     ? Omega-3 1000 MG CAPS Take 1 capsule by mouth daily.     ? ondansetron (ZOFRAN ODT) 4 MG disintegrating tablet Take 1 tablet (4 mg total) every 8 (eight) hours as needed by mouth for nausea or vomiting. (Patient not taking: Reported on 07/18/2021) 20 tablet 0 Not Taking  ? OXcarbazepine (TRILEPTAL) 150 MG tablet Take 150 mg by mouth 2 (two) times daily. Take 2 tablets by mouth 3 times daily for headaches.     ? Oxycodone HCl 10 MG TABS Take 1 tablet (10 mg total) every 6 (six) hours as needed by mouth. 9 tablet 0   ? Pancrelipase, Lip-Prot-Amyl, 24000-76000 units CPEP Take 24,000 Units by mouth 3 (three) times daily.     ? sildenafil (VIAGRA) 100 MG tablet Take 100 mg by mouth daily as needed for erectile dysfunction.     ? simvastatin (ZOCOR) 80 MG tablet Take 80 mg by mouth daily.     ? triamcinolone cream (KENALOG) 0.1 % Apply 1 application topically 2 (two) times daily. Apply as directed to affected area bid.     ? TURMERIC PO Take 1 tablet by mouth daily.     ? ? ?Assessment: ?Pharmacy consulted to dose heparin in this 66 year old male admitted with CHF exacerbation, now with ACS/NSTEMI.  Pt received lovenox 40 mg SQ x 1 @ 2100. ?CrCl = 122.8 ml/min  ? ?Goal of Therapy:  ?Heparin level 0.3-0.7 units/ml ?Monitor platelets by anticoagulation protocol: Yes ?  ?Plan:  ?Pt received lovenox 40 mg SQ X 1 on 4/28 @ 2100, will not order bolus.  ?Start heparin  infusion at 1400 units/hr ?Check anti-Xa level in 6 hours and daily while on heparin ?Continue to monitor H&H and platelets ? ?Desmin Daleo D ?07/19/2021,12:47 AM ? ? ?

## 2021-07-19 NOTE — Progress Notes (Signed)
Pt frustrated and wanting to leave.  Pt argumentative with Designer, multimedia.  Attempted to provide pt education regarding medications, CHF, and the need for additional medical care/leaving AMA.  Pt refusing education and arguing with staff.  MD notified. ?

## 2021-07-19 NOTE — Progress Notes (Signed)
Pt refused to let RN start new bag of heparin (prior bag almost empty). RN explained purpose of heparin and risks of blood clots and stroke if not on heparin due to atrial fibrillation. Explained need for continuous heparin infusion since heparin does not last long. Pt argumentative and disagreeing about purpose of heparin and risks during conversation and still refused to let RN start new bag. Heparin stopped and disconnected from IV per patient's insistence. NP notified.  ?RN also explained need and purpose of telemetry but pt also refusing to be placed back on telemetry. Pt did allow RN to obtain vitals and do physical assessment.  ? ?Later pt requests oxycodone for back pain - informed pt that it is ordered every 6 hours and unable to give next dose until 2300. Declined tylenol. Pt sat on edge of bed watching TV for a while. Ate a salad then said "my pancreas is flaring up" but refused creon. At 2300 RN took oxycodone into pt's room but pt refused saying his back pain was too "severe" and the "oxycodone won't help" and IV dilaudid is the only thing that will help. NP notified. Order for oral dilaudid received. After long discussion, pt agreeable to try oral dilaudid. Pt states pain is not any better after oral dilaudid and again insisting on needing IV dilaudid.  ?NP notified and NP went to bedside to speak with pt about pain meds and telemetry but pt became agitated and repeatedly saying "get out of my room" in raised voice. RN and NP left.  ? ?Pt's exwife at bedside during all of above. Pt and ex-wife requesting supervisor or "a doctor above the NP" to order IV dilaudid. Camera operator and night nursing supervisor notified. RN explained to pt and exwife that they have been notified and RN is waiting for response. Pt and wife both agitated and pt talking about being discharged. RN offered option of AMA but pt and exwife state that they do not have a vehicle here tonight and someone else will be able to drive him  in the morning and bring his O2 tank. Pt agitated saying that RN is not helping him and that RN is "racist" and will not engage in conversation with this RN anymore. Agricultural consultant notified. Care of pt transferred to different RN per charge nurse instruction.  ?

## 2021-07-19 NOTE — Progress Notes (Signed)
Chaplain visited with patient and family due to consult entered. Pt. and family did not appear to be receptive to Chaplain's visit. Please contact Chaplain if requested.  ?

## 2021-07-19 NOTE — Progress Notes (Signed)
Pt refusing nasal swab - pt stating that he has Covid antibodies in his system and doesn't need to be tested.  Education provided.  Will continue to monitor.   ?

## 2021-07-19 NOTE — Progress Notes (Signed)
Pt refusing several morning medications.  Pt refused first stating that he didn't want all these medications to be filled and they are too expensive.  MD notified.  Re-attempted to administered coreg and pt continue to refuse stating there are too many side effects - at home he stopped taking his medications and he felt better.  Education provided.  Pt refused.  Will continue to monitor.  MD notified.   ?

## 2021-07-19 NOTE — Progress Notes (Signed)
RN attempted to administer coreg and oxcarbazepine after cardiology MD visit - pt continuing to refuse.  Education provided.  Pt daughter at bedside.  Will continue to monitor.   ?

## 2021-07-19 NOTE — Progress Notes (Signed)
Date and time results received: 07/19/21 0226 ?(use smartphrase ".now" to insert current time) ? ?Test: troponin ?Critical Value: 699 ? ?Name of Provider Notified: Sharion Settler NP ? ?Orders Received? Or Actions Taken?:  ?

## 2021-07-19 NOTE — Progress Notes (Signed)
?   07/19/21 0204  ?Assess: MEWS Score  ?Temp 98.1 ?F (36.7 ?C)  ?BP (!) 171/116  ?Pulse Rate (!) 145  ?ECG Heart Rate (!) 134  ?Resp (!) 21  ?Level of Consciousness Alert  ?SpO2 95 %  ?O2 Device Nasal Cannula  ?O2 Flow Rate (L/min) 2 L/min  ?Assess: MEWS Score  ?MEWS Temp 0  ?MEWS Systolic 0  ?MEWS Pulse 3  ?MEWS RR 1  ?MEWS LOC 0  ?MEWS Score 4  ?MEWS Score Color Red  ?Assess: if the MEWS score is Yellow or Red  ?Were vital signs taken at a resting state? Yes  ?Focused Assessment Change from prior assessment (see assessment flowsheet)  ?Does the patient meet 2 or more of the SIRS criteria? Yes  ?Does the patient have a confirmed or suspected source of infection? No  ?MEWS guidelines implemented *See Row Information* Yes  ?Treat  ?MEWS Interventions Escalated (See documentation below)  ?Pain Scale 0-10  ?Pain Score 0  ?Take Vital Signs  ?Increase Vital Sign Frequency  Yellow: Q 2hr X 2 then Q 4hr X 2, if remains yellow, continue Q 4hrs  ?Escalate  ?MEWS: Escalate Yellow: discuss with charge nurse/RN and consider discussing with provider and RRT  ?Notify: Charge Nurse/RN  ?Name of Charge Nurse/RN Notified Felicia  ?Date Charge Nurse/RN Notified 07/19/21  ?Time Charge Nurse/RN Notified 0200  ?Notify: Provider  ?Provider Name/Title Sharion Settler NP  ?Date Provider Notified 07/19/21  ?Time Provider Notified [phone]  ?Notification Type Call  ?Provider response See new orders  ?Date of Provider Response 07/19/21  ?Time of Provider Response 0205  ?Document  ?Patient Outcome Stabilized after interventions  ?Progress note created (see row info) Yes  ?Assess: SIRS CRITERIA  ?SIRS Temperature  0  ?SIRS Pulse 1  ?SIRS Respirations  1  ?SIRS WBC 0  ?SIRS Score Sum  2  ? ? ?

## 2021-07-19 NOTE — Progress Notes (Addendum)
?  Progress Note ? ? ?Patient: Don Brown UXN:[id number] DOB: 07-Aug-1955 DOA: 07/18/2021     1 ?DOS: the patient was seen and examined on 07/19/2021 ?  ?Brief hospital course: ?No notes on file ? ?Assessment and Plan: ?* Acute on chronic systolic CHF (congestive heart failure) ([place]) ?Patient presents for evaluation of worsening shortness of breath associated with hypoxia ?He is on 2 L of oxygen as needed at home but over the last several weeks has required continuous oxygen supplementation ?--LVEF 20-25%, same as Echo in 2015.  Has not followed up with cardiology.  Has been intentional non-compliant with his cardiac mes; has not been taking home coreg, and refuses to take any other forms of beta blockers; ran out of diuretics for weeks. ?--Patient declined any ischemic work-up or left heart cath ?Plan: ?--cardiology consult today ?--cont IV lasix, per cardio ?--Continue benazepril ?--He will need a higher dose of Lasix upon discharge previously on 40 mg daily, likely need 40 mg twice daily ? ?Type 2 diabetes mellitus () ?--last A1c 9.1, poorly controlled ?--resume home Humulin N as 30u daily ?--SSI for now ? ?Morbid obesity ([place]) ?BMI 36.63 ?Complicates overall prognosis and care ?Lifestyle modification and exercise discussed with patient in detail ? ?Chronic pancreatitis ([place]) ?Patient has a history of chronic abdominal pain secondary to chronic pancreatitis ?Plan: ?--cont home oxycodone 10 mg q6h PRN  ?--no IV pain meds ?--Creon PRN ? ?Chronic atrial fibrillation (HCC) ?--pt refused any beta blocker ?Continue digoxin ?--pt refused anticoagulation ?--cont heparin gtt, per cardio ? ?Depression ?--pt refused home Lexapro ? ?Noncompliance with medications ?--intentional. ? ?Acute on chronic respiratory failure with hypoxemia (HCC) ?--on baseline 2L PRN, now needing 2L continuously ?--CT chest showed increase in patchy infiltrates in both lower lung.  No leukocytosis, no fever, procal neg, so unlikely to be bacterial  PNA.  Clinical pic more consistent with fluid overload.  Pt complained of a month-long cough, however, refused to be swabbed for COVID test. ?Plan: ?--treat CHF exacerbation ?--Continue supplemental O2 to keep sats >=90%, wean as tolerated ? ? ? ? ?  ? ?Subjective:  ?Pt has been refusing cardiac meds, augmentative, and wanted to leave but didn't.  See nursing notes. ? ?Cardiology consulted, and pt refused all workup offered.  Only thing pt accepted was IV lasix and opioids pain meds. ? ? ?Physical Exam: ? ?Constitutional: NAD, AAOx3 ?HEENT: conjunctivae and lids normal, EOMI ?CV: No cyanosis.   ?RESP: increased RR, on 2L ?Neuro: II - XII grossly intact.   ?Psych: irritable mood and affect.   ? ? ?Data Reviewed: ? ?Family Communication: daughter updated on the phone today ? ?Disposition: ?Status is: Inpatient ? ? Planned Discharge Destination: Home ? ? ? ?Time spent: 70 minutes ? ?Author: ?Enzo Bi, MD ?07/19/2021 6:01 PM ? ?For on call review [email or web address].  ?

## 2021-07-19 NOTE — Progress Notes (Signed)
Pt refusing cardiac monitoring - pt stating "why do you need to monitor my heart" - MD notified.   ?

## 2021-07-20 DIAGNOSIS — I10 Essential (primary) hypertension: Secondary | ICD-10-CM

## 2021-07-20 DIAGNOSIS — I5023 Acute on chronic systolic (congestive) heart failure: Secondary | ICD-10-CM | POA: Diagnosis not present

## 2021-07-20 DIAGNOSIS — I429 Cardiomyopathy, unspecified: Secondary | ICD-10-CM | POA: Diagnosis not present

## 2021-07-20 DIAGNOSIS — I4821 Permanent atrial fibrillation: Secondary | ICD-10-CM | POA: Diagnosis not present

## 2021-07-20 LAB — BASIC METABOLIC PANEL
Anion gap: 8 (ref 5–15)
BUN: 20 mg/dL (ref 8–23)
CO2: 35 mmol/L — ABNORMAL HIGH (ref 22–32)
Calcium: 8.7 mg/dL — ABNORMAL LOW (ref 8.9–10.3)
Chloride: 98 mmol/L (ref 98–111)
Creatinine, Ser: 0.93 mg/dL (ref 0.61–1.24)
GFR, Estimated: >60 mL/min (ref >60)
Glucose, Bld: 182 mg/dL — ABNORMAL HIGH (ref 70–99)
Potassium: 3.6 mmol/L (ref 3.5–5.1)
Sodium: 141 mmol/L (ref 135–145)

## 2021-07-20 LAB — CBC
HCT: 40.7 % (ref 39.0–52.0)
Hemoglobin: 12 g/dL — ABNORMAL LOW (ref 13.0–17.0)
MCH: 25.1 pg — ABNORMAL LOW (ref 26.0–34.0)
MCHC: 29.5 g/dL — ABNORMAL LOW (ref 30.0–36.0)
MCV: 85 fL (ref 80.0–100.0)
Platelets: 164 10*3/uL (ref 150–400)
RBC: 4.79 MIL/uL (ref 4.22–5.81)
RDW: 15 % (ref 11.5–15.5)
WBC: 4.8 10*3/uL (ref 4.0–10.5)
nRBC: 0 % (ref 0.0–0.2)

## 2021-07-20 LAB — GLUCOSE, CAPILLARY
Glucose-Capillary: 178 mg/dL — ABNORMAL HIGH (ref 70–99)
Glucose-Capillary: 291 mg/dL — ABNORMAL HIGH (ref 70–99)
Glucose-Capillary: 136 mg/dL — ABNORMAL HIGH (ref 70–99)

## 2021-07-20 LAB — MAGNESIUM: Magnesium: 2 mg/dL (ref 1.7–2.4)

## 2021-07-20 LAB — HEPARIN LEVEL (UNFRACTIONATED): Heparin Unfractionated: <0.1 IU/mL — ABNORMAL LOW (ref 0.30–0.70)

## 2021-07-20 MED ORDER — ENOXAPARIN SODIUM 80 MG/0.8ML IJ SOSY
0.5000 mg/kg | PREFILLED_SYRINGE | INTRAMUSCULAR | Status: DC
  Filled 2021-07-20: qty 0.8

## 2021-07-20 MED ORDER — BENAZEPRIL HCL 20 MG PO TABS
20.0000 mg | ORAL_TABLET | Freq: Every day | ORAL | Status: DC
  Administered 2021-07-20: 20 mg via ORAL
  Filled 2021-07-20: qty 1

## 2021-07-20 MED ORDER — FUROSEMIDE 40 MG PO TABS
80.0000 mg | ORAL_TABLET | Freq: Two times a day (BID) | ORAL | 0 refills | Status: DC
Start: 2021-07-20 — End: 2021-11-26

## 2021-07-20 NOTE — Progress Notes (Signed)
Assumed the care of [name].  ?

## 2021-07-20 NOTE — Discharge Summary (Signed)
? ?Physician AMA Discharge Summary ? ? ?Don Brown  male DOB: 05/01/1955  ?MCN:[id number] ? ?PCP: Charlsie Merles, MD ? ?Admit date: 07/18/2021 ?AMA Discharge date: 07/20/2021 ? ?Admitted From: home ?Disposition:  home ?CODE STATUS: Full code ? ? ?Hospital Course:  ?For full details, please see H&P, progress notes, consult notes and ancillary notes.  ?Briefly,  ?Don Brown is a 66 y.o. male with medical history significant for chronic systolic heart failure with last known LVEF of 20 to 25% from a 2D echocardiogram which was done in 2015, history of paroxysmal A-fib, anxiety, depression, chronic pancreatitis, chronic respiratory failure on 2 L of oxygen as needed, diabetes mellitus type 2 who presented to the ER for evaluation of shortness of breath that has worsened over the last several weeks.   ? ?Patient states that he had problems with his medications and he was out of his diuretic for a period of time.  This was resumed by his primary care provider and he states that he has been taking it as recommended but notes that his urine output has decreased.  Shortness of breath is mostly with exertion and he denies having any chest pain.  He notes that he is currently using his oxygen continuously instead of as needed.  He has bilateral lower extremity swelling ? ?* Acute on chronic systolic CHF (congestive heart failure) ([place]) ?--current Echo showed LVEF 20-25%, same as Echo in 2015.  Has not followed up with cardiology.  Has been intentional non-compliant with his cardiac meds; has not been taking home coreg, and refuses to take any other forms of beta blockers; ran out of diuretics for weeks. ?--cardiology consulted.   ?--Patient declined any ischemic work-up or left heart cath ?--pt received 3 doses of IV lasix 40 mg and 3 doses of IV lasix 80 mg before pt decided to leave AMA.   ?--Pt was provided with printed Rx for oral lasix 80 mg BID for 7 days.  Pt will have to follow up with outpatient cardiology for  further diuretic Rx. ?--Pt should continue coreg and benazepril ? ?Acute on chronic respiratory failure with hypoxemia (HCC) ?--on baseline 2L PRN, now needing 2L continuously ?--CT chest showed increase in patchy infiltrates in both lower lung.  No leukocytosis, no fever, procal neg, so unlikely to be bacterial PNA.  Clinical pic more consistent with fluid overload.  Pt complained of a month-long cough, however, refused to be swabbed for COVID test.  ? ?Noncompliance with medications ?--intentional.  Pt has certain beliefs of what harm each medication had done to him.  The only medications pt was willing to take were lasix and opioids pain meds. ?  ?Type 2 diabetes mellitus ([place]) ?--last A1c 9.1, poorly controlled ?  ?Morbid obesity ([place]) ?BMI 36.63 ?Complicates overall prognosis and care ?Lifestyle modification and exercise discussed with patient in detail ?  ?Chronic pancreatitis ([place]) ?Patient has a history of chronic abdominal pain secondary to chronic pancreatitis.  Lipase wnl on presentation, and CT a/p showed no finding to suggest acute pancreatitis. ?--Pt repeatedly requested IV dilaudid and was ordered oral dilaudid instead of his home oxycodone for pain while inpatient. ?--Pt refused his Creon PRN ?  ?Chronic atrial fibrillation (HCC) ?--pt refused any beta blocker ?--Continue digoxin ?--pt refused anticoagulation ?  ?Depression ?--pt refused home Lexapro ?  ? ?Discharge Diagnoses:  ?Principal Problem: ?  Acute on chronic systolic CHF (congestive heart failure) ([place]) ?Active Problems: ?  Type 2 diabetes mellitus ([place]) ?  Morbid obesity (  [place]) ?  Chronic pancreatitis ([place]) ?  Chronic atrial fibrillation (HCC) ?  Depression ?  Acute on chronic respiratory failure with hypoxemia (HCC) ?  Noncompliance with medications ? ? ?30 Day Unplanned Readmission Risk Score   ? ?Flowsheet Row ED to Hosp-Admission (Current) from 07/18/2021 in [facility]  ?30 Day Unplanned Readmission Risk Score (%)  15.74 Filed at 07/20/2021 1600  ? ?  ? ? This score is the patient's risk of an unplanned readmission within 30 days of being discharged (0 -100%). The score is based on dignosis, age, lab data, medications, orders, and past utilization.   ?Low:  0-14.9   Medium: 15-21.9   High: 22-29.9   Extreme: 30 and above ? ?  ? ?  ? ? ?Discharge Instructions: ? ?No med rec due to pt leaving AMA. ? ? ? ?Allergies  ?Allergen Reactions  ? Morphine And Related Nausea And Vomiting  ? ? ? ?The results of significant diagnostics from this hospitalization (including imaging, microbiology, ancillary and laboratory) are listed below for reference.  ? ?Consultations: ? ? ?Procedures/Studies: ?DG Chest 2 View ? ?Result Date: 07/18/2021 ?CLINICAL DATA:  Shortness of breath. EXAM: CHEST - 2 VIEW COMPARISON:  AP chest 05/08/2021, chest two views 01/22/2016, CT abdomen 12/09/2020 FINDINGS: Cardiac silhouette is moderately to markedly enlarged, unchanged. Mediastinal contours are grossly within normal limits. Moderately decreased lung volumes are again seen. When comparing multiple prior radiographs, there is again moderate curvilinear right midlung and left basilar scarring. Mildly increased left costophrenic angle density compared to prior however there is improved aeration of the right lower lung field. No pneumothorax. No definitive pleural effusion. Moderate dextrocurvature of the mid to upper thoracic spine with multilevel degenerative disc changes. IMPRESSION: Chronic right mid and lower lung and left lower lung scarring. There may be slightly increased opacification overlying the left costophrenic angle, however there is also improved aeration of the right lower lung. Findings may represent waxing waning atelectasis versus pneumonia airspace opacities. Electronically Signed   By: Yvonne Kendall M.D.   On: 07/18/2021 11:26  ? ?ECHOCARDIOGRAM COMPLETE ? ?Result Date: 07/19/2021 ?   ECHOCARDIOGRAM REPORT   Patient Name:   Don Brown Date of  Exam: 07/18/2021 Medical Rec #:  [id number]   Height:       72.0 in Accession #:    [id number]  Weight:       270.1 lb Date of Birth:  Jun 30, 1955   BSA:          2.421 m? Patient Age:    53 years    BP:           139/67 mmHg Patient Gender: M           HR:           120 bpm. Exam Location:  [facility] Procedure: 2D Echo, Cardiac Doppler and Color Doppler Indications:     H06.23 Acute Systolic Heart Failure  History:         Patient has no prior history of Echocardiogram examinations.                  CHF, Mitral Valve Prolapse, Arrythmias:Atrial Fibrillation,                  Signs/Symptoms:Murmur; Risk Factors:Diabetes, Dyslipidemia,                  Sleep Apnea and Obesity.  Sonographer:     Wilford Sports Rodgers-Jones RDCS Referring Phys:  [place] Diagnosing Phys: Kate Sable MD IMPRESSIONS  1. Left ventricular ejection fraction, by estimation, is 20 to 25%. The left ventricle has severely decreased function. The left ventricle demonstrates global hypokinesis. The left ventricular internal cavity size was mildly dilated. There is mild left ventricular hypertrophy. Left ventricular diastolic parameters are indeterminate.  2. Right ventricular systolic function is low normal. The right ventricular size is mildly enlarged.  3. The mitral valve is grossly normal. No evidence of mitral valve regurgitation.  4. The aortic valve was not well visualized. Aortic valve regurgitation is not visualized.  5. Aortic dilatation noted. There is mild dilatation of the aortic root, measuring 43 mm. FINDINGS  Left Ventricle: Left ventricular ejection fraction, by estimation, is 20 to 25%. The left ventricle has severely decreased function. The left ventricle demonstrates global hypokinesis. The left ventricular internal cavity size was mildly dilated. There is mild left ventricular hypertrophy. Left ventricular diastolic parameters are indeterminate. Right Ventricle: The right ventricular size is mildly enlarged. No increase  in right ventricular wall thickness. Right ventricular systolic function is low normal. Left Atrium: Left atrial size was normal in size. Right Atrium: Right atrial size was normal in size. Pericardium:

## 2021-07-20 NOTE — Progress Notes (Signed)
Patient wanted to leave AMA, gave him prescription script for lasix as prescribe by MD, patient wanted me to give him a discharge paper and  I told him that I couldn't t give him any papers because he is leaving against medical  advice, patient became agitated , stated that we holding against his will . Patient currently in his room daughter at bedside. ?

## 2021-07-20 NOTE — Progress Notes (Addendum)
? ?Progress Note ? ?Patient Name: Don Brown ?Date of Encounter: 07/20/2021 ? ?[place] Cardiologist: [facility] ? ?Subjective  ? ?Shortness of breath improved, wants to go home ? ?Inpatient Medications  ?  ?Scheduled Meds: ? aspirin EC  81 mg Oral QAC breakfast  ? benazepril  20 mg Oral Daily  ? carvedilol  12.5 mg Oral BID WC  ? digoxin  125 mcg Oral BH-q7a  ? furosemide  80 mg Intravenous BID  ? insulin aspart  0-20 Units Subcutaneous TID WC  ? insulin detemir  30 Units Subcutaneous BH-q7a  ? potassium chloride SA  40 mEq Oral Daily  ? sodium chloride flush  3 mL Intravenous Q12H  ? ?Continuous Infusions: ? sodium chloride    ? ?PRN Meds: ?sodium chloride, acetaminophen **OR** acetaminophen, HYDROmorphone, iohexol, lipase/protease/amylase, ondansetron **OR** ondansetron (ZOFRAN) IV, sodium chloride flush  ? ?Vital Signs  ?  ?Vitals:  ? 07/20/21 2725 07/20/21 0406 07/20/21 0425 07/20/21 3664  ?BP:  (!) 139/128  (!) 172/88  ?Pulse:  93  87  ?Resp:    19  ?Temp: 98.2 ?F (36.8 ?C)   98.1 ?F (36.7 ?C)  ?TempSrc: Oral   Axillary  ?SpO2:  98%  99%  ?Weight:   127 kg   ?Height:      ? ? ?Intake/Output Summary (Last 24 hours) at 07/20/2021 1037 ?Last data filed at 07/20/2021 4034 ?Gross per 24 hour  ?Intake 322.76 ml  ?Output 2450 ml  ?Net -2127.24 ml  ? ? ?  07/20/2021  ?  4:25 AM 07/19/2021  ?  4:19 AM 07/18/2021  ? 10:44 PM  ?Last 3 Weights  ?Weight (lbs) 279 lb 15.8 oz 291 lb 0.1 oz 294 lb 1.5 oz  ?Weight (kg) 127 kg 132 kg 133.4 kg  ?   ? ?Telemetry  ?  ?Currently off telemetry- Personally Reviewed ? ?ECG  ?  ? - Personally Reviewed ? ?Physical Exam  ? ?GEN: Mild respiratory distress ?Neck: No JVD ?Cardiac: Irregular irregular ?Respiratory: Decreased breath sounds bilaterally ?GI: Soft, abdominal scar noted ?MS: No edema;  ?Neuro:  Nonfocal  ?Psych: Normal affect  ? ?Labs  ?  ?High Sensitivity Troponin:   ?Recent Labs  ?Lab 07/18/21 ?1107 07/18/21 ?2016 07/19/21 ?0127 07/19/21 ?7425  ?TROPONINIHS 53* 533*  669* 572*  ?   ?Chemistry ?Recent Labs  ?Lab 07/18/21 ?1107 07/18/21 ?2016 07/19/21 ?0127 07/20/21 ?0758  ?NA 139  --  140 141  ?K 3.4*  --  4.6 3.6  ?CL 99  --  96* 98  ?CO2 33*  --  39* 35*  ?GLUCOSE 133*  --  146* 182*  ?BUN 13  --  15 20  ?CREATININE 0.74  --  0.97 0.93  ?CALCIUM 9.0  --  8.7* 8.7*  ?MG  --  2.0  --  2.0  ?PROT 8.2*  --   --   --   ?ALBUMIN 3.1*  --   --   --   ?AST 18  --   --   --   ?ALT 15  --   --   --   ?ALKPHOS 57  --   --   --   ?BILITOT 0.9  --   --   --   ?GFRNONAA >60  --  >60 >60  ?ANIONGAP 7  --  5 8  ?  ?Lipids No results for input(s): CHOL, TRIG, HDL, LABVLDL, LDLCALC, CHOLHDL in the last 168 hours.  ?Hematology ?Recent Labs  ?Lab  07/18/21 ?1107 07/19/21 ?0127 07/20/21 ?9024  ?WBC 3.3* 5.1 4.8  ?RBC 4.93 4.79 4.79  ?HGB 12.4* 12.2* 12.0*  ?HCT 42.2 42.2 40.7  ?MCV 85.6 88.1 85.0  ?MCH 25.2* 25.5* 25.1*  ?MCHC 29.4* 28.9* 29.5*  ?RDW 14.9 14.9 15.0  ?PLT 156 170 164  ? ?Thyroid No results for input(s): TSH, FREET4 in the last 168 hours.  ?BNP ?Recent Labs  ?Lab 07/18/21 ?1107  ?BNP 263.6*  ?  ?DDimer No results for input(s): DDIMER in the last 168 hours.  ? ?Radiology  ?  ?DG Chest 2 View ? ?Result Date: 07/18/2021 ?CLINICAL DATA:  Shortness of breath. EXAM: CHEST - 2 VIEW COMPARISON:  AP chest 05/08/2021, chest two views 01/22/2016, CT abdomen 12/09/2020 FINDINGS: Cardiac silhouette is moderately to markedly enlarged, unchanged. Mediastinal contours are grossly within normal limits. Moderately decreased lung volumes are again seen. When comparing multiple prior radiographs, there is again moderate curvilinear right midlung and left basilar scarring. Mildly increased left costophrenic angle density compared to prior however there is improved aeration of the right lower lung field. No pneumothorax. No definitive pleural effusion. Moderate dextrocurvature of the mid to upper thoracic spine with multilevel degenerative disc changes. IMPRESSION: Chronic right mid and lower lung and left  lower lung scarring. There may be slightly increased opacification overlying the left costophrenic angle, however there is also improved aeration of the right lower lung. Findings may represent waxing waning atelectasis versus pneumonia airspace opacities. Electronically Signed   By: Yvonne Kendall M.D.   On: 07/18/2021 11:26  ? ?ECHOCARDIOGRAM COMPLETE ? ?Result Date: 07/19/2021 ?   ECHOCARDIOGRAM REPORT   Patient Name:   Don Brown Date of Exam: 07/18/2021 Medical Rec #:  [id number]   Height:       72.0 in Accession #:    [id number]  Weight:       270.1 lb Date of Birth:  07/30/1955   BSA:          2.421 m? Patient Age:    66 years    BP:           139/67 mmHg Patient Gender: M           HR:           120 bpm. Exam Location:  [facility] Procedure: 2D Echo, Cardiac Doppler and Color Doppler Indications:     Q22.97 Acute Systolic Heart Failure  History:         Patient has no prior history of Echocardiogram examinations.                  CHF, Mitral Valve Prolapse, Arrythmias:Atrial Fibrillation,                  Signs/Symptoms:Murmur; Risk Factors:Diabetes, Dyslipidemia,                  Sleep Apnea and Obesity.  Sonographer:     Cresenciano Lick RDCS Referring Phys:  [id number] HERDEYCX AGBATA Diagnosing Phys: Kate Sable MD IMPRESSIONS  1. Left ventricular ejection fraction, by estimation, is 20 to 25%. The left ventricle has severely decreased function. The left ventricle demonstrates global hypokinesis. The left ventricular internal cavity size was mildly dilated. There is mild left ventricular hypertrophy. Left ventricular diastolic parameters are indeterminate.  2. Right ventricular systolic function is low normal. The right ventricular size is mildly enlarged.  3. The mitral valve is grossly normal. No evidence of mitral valve regurgitation.  4. The aortic valve was not well  visualized. Aortic valve regurgitation is not visualized.  5. Aortic dilatation noted. There is mild dilatation of the aortic root,  measuring 43 mm. FINDINGS  Left Ventricle: Left ventricular ejection fraction, by estimation, is 20 to 25%. The left ventricle has severely decreased function. The left ventricle demonstrates global hypokinesis. The left ventricular internal cavity size was mildly dilated. There is mild left ventricular hypertrophy. Left ventricular diastolic parameters are indeterminate. Right Ventricle: The right ventricular size is mildly enlarged. No increase in right ventricular wall thickness. Right ventricular systolic function is low normal. Left Atrium: Left atrial size was normal in size. Right Atrium: Right atrial size was normal in size. Pericardium: There is no evidence of pericardial effusion. Mitral Valve: The mitral valve is grossly normal. No evidence of mitral valve regurgitation. Tricuspid Valve: The tricuspid valve is not well visualized. Tricuspid valve regurgitation is not demonstrated. Aortic Valve: The aortic valve was not well visualized. Aortic valve regurgitation is not visualized. Aortic regurgitation PHT measures 283 msec. Pulmonic Valve: The pulmonic valve was not well visualized. Pulmonic valve regurgitation is not visualized. Aorta: Aortic dilatation noted. There is mild dilatation of the aortic root, measuring 43 mm. Venous: The inferior vena cava was not well visualized. IAS/Shunts: No atrial level shunt detected by color flow Doppler.  LEFT VENTRICLE PLAX 2D LVIDd:         6.30 cm LVIDs:         5.00 cm LV PW:         1.10 cm LV IVS:        1.10 cm LVOT diam:     2.20 cm LV SV:         51 LV SV Index:   21 LVOT Area:     3.80 cm?  RIGHT VENTRICLE RV Basal diam:  4.40 cm RV S prime:     16.33 cm/s TAPSE (M-mode): 2.7 cm LEFT ATRIUM              Index        RIGHT ATRIUM           Index LA diam:        4.40 cm  1.82 cm/m?   RA Area:     24.60 cm? LA Vol (A2C):   133.0 ml 54.94 ml/m?  RA Volume:   88.50 ml  36.56 ml/m? LA Vol (A4C):   127.0 ml 52.47 ml/m? LA Biplane Vol: 137.0 ml 56.60 ml/m?  AORTIC  VALVE LVOT Vmax:   85.50 cm/s LVOT Vmean:  61.700 cm/s LVOT VTI:    0.134 m AI PHT:      283 msec  AORTA Ao Root diam: 4.30 cm Ao Asc diam:  3.70 cm MV E velocity: 111.00 cm/s                             SHUN

## 2021-07-20 NOTE — Progress Notes (Signed)
Patient left AMA at this time.

## 2021-07-20 NOTE — TOC Progression Note (Signed)
Transition of Care (TOC) - Progression Note  ? ? ?Patient Details  ?Name: Don Brown ?MRN: [id number] ?Date of Birth: June 28, 1955 ? ?Transition of Care (TOC) CM/SW Contact  ?Izola Price, RN ?Phone Number: ?07/20/2021, 1:54 PM ? ?Clinical Narrative: 4/30: 2 pm. Patient has been refusing medications, HF screen/education, treatments, and telemetry monitoring yesterday and through the night with multiple RN progress notes and discussed in rounds. Provider waiting on cardiology consult to clear before possible DC. Today patient is taking some medications per RN, but refusing further cardiac work up per consult and refusing to allow telemetry. Refused chaplain. May sign out AMA. RN CM unable to conduct a TOC consult just yet. Consult was in progress and patient has been refusing and yelling at staff. No PT/OT evaluations yet for [facility] consult. Chronic oxygen at home prn. [employer] insurance.  ?PCP: Dr. Herold Harms ?RX: 1. [place], [place] ?2. [place] ([place]), [place] - [address] ?3. [place], [place]. ?Morton,Rachelle (Daughter)  ?[phone] ([place]) ?Simmie Davies RN CM    ? ? ? ?  ?  ? ?Expected Discharge Plan and Services ?  ?  ?  ?  ?  ?                ?  ?  ?  ?  ?  ?  ?  ?  ?  ?  ? ? ?Social Determinants of Health (SDOH) Interventions ?  ? ?Readmission Risk Interventions ?   ? View : No data to display.  ?  ?  ?  ? ? ?

## 2021-07-20 NOTE — Progress Notes (Signed)
?  Progress Note ? ? ?Patient: Don Brown GEX:[id number] DOB: 1955-06-04 DOA: 07/18/2021     2 ?DOS: the patient was seen and examined on 07/20/2021 ?  ?Brief hospital course: ?No notes on file ? ?Assessment and Plan: ?* Acute on chronic systolic CHF (congestive heart failure) ([place]) ?Patient presents for evaluation of worsening shortness of breath associated with hypoxia ?He is on 2 L of oxygen as needed at home but over the last several weeks has required continuous oxygen supplementation ?--LVEF 20-25%, same as Echo in 2015.  Has not followed up with cardiology.  Has been intentional non-compliant with his cardiac mes; has not been taking home coreg, and refuses to take any other forms of beta blockers; ran out of diuretics for weeks. ?--cardiology consulted.   ?--Patient declined any ischemic work-up or left heart cath ?Plan: ?--cont IV lasix 80 mg BID, per cardio ?--Continue coreg and benazepril ? ?Type 2 diabetes mellitus ([place]) ?--last A1c 9.1, poorly controlled ?--cont home Humulin N as reduced 30u daily ?--SSI for now ? ?Morbid obesity ([place]) ?BMI 36.63 ?Complicates overall prognosis and care ?Lifestyle modification and exercise discussed with patient in detail ? ?Chronic pancreatitis ([place]) ?Patient has a history of chronic abdominal pain secondary to chronic pancreatitis.  Lipase wnl on presentation, and CT a/p showed no finding to suggest acute pancreatitis. ?--pain med switched to oral dilaudid with increased frequency overnight ?Plan: ?--cont oral dilaudid at current dose ?--no IV pain meds ?--Creon PRN ? ?Chronic atrial fibrillation (HCC) ?--pt refused any beta blocker ?Continue digoxin ?--pt refused anticoagulation, d/c heparin gtt ? ?Depression ?--pt refused home Lexapro ? ?Noncompliance with medications ?--intentional. ? ?Acute on chronic respiratory failure with hypoxemia (HCC) ?--on baseline 2L PRN, now needing 2L continuously ?--CT chest showed increase in patchy infiltrates in both lower lung.  No  leukocytosis, no fever, procal neg, so unlikely to be bacterial PNA.  Clinical pic more consistent with fluid overload.  Pt complained of a month-long cough, however, refused to be swabbed for COVID test. ?Plan: ?--treat CHF exacerbation ?--Continue supplemental O2 to keep sats >=90%, wean as tolerated ? ? ? ? ?  ? ?Subjective:  ?Pt continued to asked to go home.  Complained of abdominal pain and asking for IV dilaudid. ? ? ?Physical Exam: ? ?Constitutional: NAD, AAOx3 ?HEENT: conjunctivae and lids normal, EOMI ?CV: No cyanosis.   ?RESP: normal respiratory effort, on 2L ?Extremities: No effusions, edema in BLE ?SKIN: warm, dry ?Neuro: II - XII grossly intact.   ? ? ? ?Data Reviewed: ? ?Family Communication: daughter and family updated at bedside today ? ?Disposition: ?Status is: Inpatient ? ? Planned Discharge Destination: Home ? ? ? ?Time spent: 50 minutes ? ?Author: ?Enzo Bi, MD ?07/20/2021 6:59 PM ? ?For on call review [email or web address].  ?

## 2021-07-30 ENCOUNTER — Ambulatory Visit: Payer: No Typology Code available for payment source | Admitting: Endocrinology

## 2021-07-31 ENCOUNTER — Ambulatory Visit: Payer: No Typology Code available for payment source | Admitting: Family

## 2021-07-31 NOTE — Progress Notes (Deleted)
   Patient ID: Don Brown, male    DOB: September 03, 1955, 66 y.o.   MRN: [id number]  HPI  Don Brown is a 66 y/o male with a history of  Echo report from 07/18/21 reviewed and showed an EF of 20-25% along with mild LVH  Review of Systems    Physical Exam    Assessment & Plan:  1: Chronic heart failure with minimal ejection fraction- - NYHA class

## 2021-08-01 ENCOUNTER — Ambulatory Visit: Payer: No Typology Code available for payment source | Admitting: Family

## 2021-08-27 ENCOUNTER — Ambulatory Visit: Payer: No Typology Code available for payment source | Admitting: Endocrinology

## 2021-11-13 ENCOUNTER — Other Ambulatory Visit: Payer: Self-pay

## 2021-11-13 DIAGNOSIS — I509 Heart failure, unspecified: Secondary | ICD-10-CM | POA: Insufficient documentation

## 2021-11-13 DIAGNOSIS — Y900 Blood alcohol level of less than 20 mg/100 ml: Secondary | ICD-10-CM | POA: Insufficient documentation

## 2021-11-13 DIAGNOSIS — I110 Hypertensive heart disease with heart failure: Secondary | ICD-10-CM | POA: Insufficient documentation

## 2021-11-13 DIAGNOSIS — E1165 Type 2 diabetes mellitus with hyperglycemia: Secondary | ICD-10-CM | POA: Insufficient documentation

## 2021-11-13 DIAGNOSIS — R1010 Upper abdominal pain, unspecified: Secondary | ICD-10-CM | POA: Diagnosis present

## 2021-11-13 DIAGNOSIS — K861 Other chronic pancreatitis: Secondary | ICD-10-CM | POA: Diagnosis not present

## 2021-11-13 LAB — ETHANOL: Alcohol, Ethyl (B): <10 mg/dL (ref <10)

## 2021-11-13 LAB — COMPREHENSIVE METABOLIC PANEL
ALT: 27 U/L (ref 0–44)
AST: 24 U/L (ref 15–41)
Albumin: 3.7 g/dL (ref 3.5–5.0)
Alkaline Phosphatase: 85 U/L (ref 38–126)
Anion gap: 8 (ref 5–15)
BUN: 33 mg/dL — ABNORMAL HIGH (ref 8–23)
CO2: 29 mmol/L (ref 22–32)
Calcium: 9.3 mg/dL (ref 8.9–10.3)
Chloride: 101 mmol/L (ref 98–111)
Creatinine, Ser: 1.37 mg/dL — ABNORMAL HIGH (ref 0.61–1.24)
GFR, Estimated: 57 mL/min — ABNORMAL LOW (ref >60)
Glucose, Bld: 384 mg/dL — ABNORMAL HIGH (ref 70–99)
Potassium: 3.9 mmol/L (ref 3.5–5.1)
Sodium: 138 mmol/L (ref 135–145)
Total Bilirubin: 0.5 mg/dL (ref 0.3–1.2)
Total Protein: 9.2 g/dL — ABNORMAL HIGH (ref 6.5–8.1)

## 2021-11-13 LAB — CBC WITH DIFFERENTIAL/PLATELET
Abs Immature Granulocytes: 0.02 10*3/uL (ref 0.00–0.07)
Basophils Absolute: 0 10*3/uL (ref 0.0–0.1)
Basophils Relative: 0 %
Eosinophils Absolute: 0.1 10*3/uL (ref 0.0–0.5)
Eosinophils Relative: 1 %
HCT: 43.1 % (ref 39.0–52.0)
Hemoglobin: 13.2 g/dL (ref 13.0–17.0)
Immature Granulocytes: 0 %
Lymphocytes Relative: 8 %
Lymphs Abs: 0.6 10*3/uL — ABNORMAL LOW (ref 0.7–4.0)
MCH: 26.5 pg (ref 26.0–34.0)
MCHC: 30.6 g/dL (ref 30.0–36.0)
MCV: 86.5 fL (ref 80.0–100.0)
Monocytes Absolute: 0.4 10*3/uL (ref 0.1–1.0)
Monocytes Relative: 5 %
Neutro Abs: 6.4 10*3/uL (ref 1.7–7.7)
Neutrophils Relative %: 86 %
Platelets: 148 10*3/uL — ABNORMAL LOW (ref 150–400)
RBC: 4.98 MIL/uL (ref 4.22–5.81)
RDW: 14.5 % (ref 11.5–15.5)
WBC: 7.5 10*3/uL (ref 4.0–10.5)
nRBC: 0 % (ref 0.0–0.2)

## 2021-11-13 LAB — CBG MONITORING, ED: Glucose-Capillary: 374 mg/dL — ABNORMAL HIGH (ref 70–99)

## 2021-11-13 LAB — LIPASE, BLOOD: Lipase: 174 U/L — ABNORMAL HIGH (ref 11–51)

## 2021-11-13 LAB — TROPONIN I (HIGH SENSITIVITY): Troponin I (High Sensitivity): 18 ng/L — ABNORMAL HIGH (ref <18)

## 2021-11-13 NOTE — ED Triage Notes (Signed)
Pt presents to ER via EMS from home with c/o left upper abd pain that started this afternoon.  Pt has hx of chronic pancreatitis.  Pt denies any alcohol use.  Pt states pain radiates around his back.  Pt endorses n/v but states he has not had any diarrhea.  Pt is A&O x4 at this time in NAD in triage.

## 2021-11-14 ENCOUNTER — Emergency Department
Admission: EM | Admit: 2021-11-14 | Discharge: 2021-11-14 | Disposition: A | Discharge status: Home / Self Care | Payer: No Typology Code available for payment source | Attending: Emergency Medicine | Admitting: Emergency Medicine

## 2021-11-14 DIAGNOSIS — R739 Hyperglycemia, unspecified: Secondary | ICD-10-CM

## 2021-11-14 DIAGNOSIS — K861 Other chronic pancreatitis: Secondary | ICD-10-CM

## 2021-11-14 LAB — TROPONIN I (HIGH SENSITIVITY): Troponin I (High Sensitivity): 19 ng/L — ABNORMAL HIGH (ref <18)

## 2021-11-14 MED ORDER — HYDROMORPHONE HCL 1 MG/ML IJ SOLN
1.0000 mg | Freq: Once | INTRAMUSCULAR | Status: AC
  Administered 2021-11-14: 1 mg via INTRAVENOUS
  Filled 2021-11-14: qty 1

## 2021-11-14 MED ORDER — SODIUM CHLORIDE 0.9 % IV BOLUS
1000.0000 mL | Freq: Once | INTRAVENOUS | Status: AC
  Administered 2021-11-14: 1000 mL via INTRAVENOUS

## 2021-11-14 MED ORDER — FAMOTIDINE IN NACL 20-0.9 MG/50ML-% IV SOLN
20.0000 mg | Freq: Once | INTRAVENOUS | Status: AC
  Administered 2021-11-14: 20 mg via INTRAVENOUS
  Filled 2021-11-14: qty 50

## 2021-11-14 MED ORDER — ONDANSETRON HCL 4 MG/2ML IJ SOLN
4.0000 mg | Freq: Once | INTRAMUSCULAR | Status: AC
  Administered 2021-11-14: 4 mg via INTRAVENOUS
  Filled 2021-11-14: qty 2

## 2021-11-14 MED ORDER — HYDROMORPHONE HCL 1 MG/ML IJ SOLN
2.0000 mg | Freq: Once | INTRAMUSCULAR | Status: AC
  Administered 2021-11-14: 2 mg via INTRAVENOUS
  Filled 2021-11-14: qty 2

## 2021-11-14 MED ORDER — LORAZEPAM 2 MG PO TABS: 2.0000 mg | ORAL_TABLET | Freq: Once | ORAL | Status: DC

## 2021-11-14 NOTE — ED Provider Notes (Addendum)
[facility] Provider Note    Event Date/Time   First MD Initiated Contact with Patient 11/14/21 [phone]     (approximate)   History   Abdominal Pain   HPI  Don Brown is a 66 y.o. male with history of hypertension, CHF, chronic respiratory failure on home O2, chronic pancreatitis, diabetes, and atrial fibrillation who presents with upper abdominal pain, acute onset today, mainly in the epigastric area and upper left abdomen, and associated with nausea.  The patient states that the pain is identical to prior episodes of pancreatitis.  He is on oxycodone at home but it did not relieve the pain.  He states that he was on a long car ride today and then ate some donuts which seem to have triggered it.  He denies any alcohol use.  He denies any fever or chills.  He has not had any change in his bowel movements.  He has no chest pain or increased difficulty breathing.    Physical Exam   Triage Vital Signs: ED Triage Vitals  Enc Vitals Group     BP 11/13/21 2305 109/68     Pulse Rate 11/13/21 2305 94     Resp 11/13/21 2305 18     Temp 11/13/21 2305 97.7 F (36.5 C)     Temp Source 11/13/21 2305 Oral     SpO2 11/13/21 2305 98 %     Weight 11/13/21 2307 258 lb (117 kg)     Height 11/13/21 2307 6' (1.829 m)     Head Circumference --      Peak Flow --      Pain Score 11/13/21 2307 10     Pain Loc --      Pain Edu? --      Excl. in [place]? --     Most recent vital signs: Vitals:   11/14/21 0300 11/14/21 0330  BP: 116/76 128/81  Pulse: 86 86  Resp: 13 17  Temp:    SpO2: 97% 98%     General: Alert and oriented, relatively comfortable appearing. CV:  Good peripheral perfusion.  Resp:  Normal effort.   Abd:  Soft with mild epigastric and left upper quadrant discomfort to palpation.  No peritoneal signs.  No distention.  Other:  No jaundice or scleral icterus.  No significant peripheral edema.   ED Results / Procedures / Treatments   Labs (all labs  ordered are listed, but only abnormal results are displayed) Labs Reviewed  CBC WITH DIFFERENTIAL/PLATELET - Abnormal; Notable for the following components:      Result Value   Platelets 148 (*)    Lymphs Abs 0.6 (*)    All other components within normal limits  COMPREHENSIVE METABOLIC PANEL - Abnormal; Notable for the following components:   Glucose, Bld 384 (*)    BUN 33 (*)    Creatinine, Ser 1.37 (*)    Total Protein 9.2 (*)    GFR, Estimated 57 (*)    All other components within normal limits  LIPASE, BLOOD - Abnormal; Notable for the following components:   Lipase 174 (*)    All other components within normal limits  CBG MONITORING, ED - Abnormal; Notable for the following components:   Glucose-Capillary 374 (*)    All other components within normal limits  TROPONIN I (HIGH SENSITIVITY) - Abnormal; Notable for the following components:   Troponin I (High Sensitivity) 18 (*)    All other components within normal limits  TROPONIN I (HIGH  SENSITIVITY) - Abnormal; Notable for the following components:   Troponin I (High Sensitivity) 19 (*)    All other components within normal limits  ETHANOL  URINALYSIS, ROUTINE W REFLEX MICROSCOPIC     EKG  ED ECG REPORT I, Arta Silence, the attending physician, personally viewed and interpreted this ECG.  Date: 11/14/2021 EKG Time: 2312 Rate: 97 Rhythm: Atrial fibrillation QRS Axis: normal Intervals: normal ST/T Wave abnormalities: Nonspecific T wave abnormalities Narrative Interpretation: Nonspecific abnormalities with no evidence of acute ischemia; no significant change when compared to EKG of 07/18/2021    RADIOLOGY    PROCEDURES:  Critical Care performed: No  Procedures   MEDICATIONS ORDERED IN ED: Medications  HYDROmorphone (DILAUDID) injection 1 mg (1 mg Intravenous Given 11/14/21 0112)  ondansetron (ZOFRAN) injection 4 mg (4 mg Intravenous Given 11/14/21 0112)  sodium chloride 0.9 % bolus 1,000 mL (0 mLs  Intravenous Stopped 11/14/21 0411)  HYDROmorphone (DILAUDID) injection 2 mg (2 mg Intravenous Given 11/14/21 0202)  famotidine (PEPCID) IVPB 20 mg premix (0 mg Intravenous Stopped 11/14/21 0248)     IMPRESSION / MDM / ASSESSMENT AND PLAN / ED COURSE  I reviewed the triage vital signs and the nursing notes.  66 year old male with PMH as noted above presents with acute onset of epigastric pain this evening which is consistent with prior episodes of pancreatitis.  He denies other significant acute symptoms.  I reviewed the past medical records.  The patient was most recently admitted in April 2023 and per the hospitalist discharge summary he presented with shortness of breath and was treated for CHF exacerbation.  Previously he was seen in the ED on 2/16 and was planned to be admitted for pancreatitis but left AMA at that time.  On exam today the patient is overall relatively comfortable appearing.  His vital signs are normal.  The abdomen is soft with mild discomfort to palpation but no focal tenderness or peritoneal signs.  The patient does not appear fluid overloaded.  Differential diagnosis includes, but is not limited to, acute pancreatitis, gastritis, gastroenteritis, gastroparesis.  Initial lab work-up is consistent with mild acute pancreatitis.  The lipase is 174 which is similar to what it was when he presented to the ED in February.  LFTs are normal.  There is no leukocytosis.  Initial troponin is minimally elevated but consistent with the patient's baseline.  Glucose is elevated but the anion gap is normal so there is no evidence of DKA or other acute complication.  Patient's presentation is most consistent with exacerbation of chronic illness.  I ordered fluids and a dose of Dilaudid initially.  The patient stated that it wore off almost immediately and now is requesting more Dilaudid.  I will give an additional dose and reassess.  At this time based on the reassuring abdominal exam, and  isolated elevated lipase as well as the patient's chronic history, there is no indication for imaging today.  The most recent CT from April showed chronic calcific pancreatitis.  The patient is on the cardiac monitor to evaluate for evidence of arrhythmia and/or significant heart rate changes.  ----------------------------------------- 3:58 AM on 11/14/2021 -----------------------------------------  On reassessment now a few hours after receiving the second dose of Dilaudid, the patient stated that he was not having any active pain and felt significantly better.  We had a discussion about whether or not he would require admission.  Overall given the mildly elevated lipase, otherwise reassuring labs, and the improved pain, there is no strict indication for  admission.  Repeat troponin showed no increase.    The patient stated that since his pain was better he would prefer to go home although was concerned that the pain may come back.  Based on further discussion with the patient, we agreed on observation admission to the hospitalist with a plan that if the patient's pain continues to be well controlled over the next several hours he likely would be able to be discharged home at some point today.    A short while later, patient asked to speak to me immediately, and he appeared upset about a discussion he had with the RN.  He stated that he and I "had an understanding" but that the nurse had subsequently explained that once he was admitted his care would be under a "house doctor" and he did not want this.  He stated that on previous admissions they had changed around other medications and "played doctor," finding problems that he did not have.  He then stated that he felt that the nurse was interjecting her own opinion and/or changing the plan that I had discussed with him, which upset him.  I stated that based on what he was telling me, the RN had explained the admission process correctly, and that once  admitted, his care would indeed be under the hospitalist.  I attempted to answer all of his questions, although he continued to appear angry and made statements about staff and the hospital that I interpreted as accusatory.  He then pointed out multiple times that he felt that nurses at the desk were looking at him in a way that he did not like, that they were "playing doctor" and that different people were telling him different things.  He stated that he no longer wanted to be admitted and wanted to go home to "take my chances."    He continued to be accusatory and confrontational, and I stated that I felt he was trying to pick a fight with [place], while we are all working together and trying to help him.  I attempted to reassure him about the care he was receiving.  He again stated that he wanted to be discharged based on the way that the nurses were looking at him.  At this time, given that the primary reason for admission was pain control, and since he is no longer having active pain and has been tolerating p.o., he is stable for discharge as per his preference.  His Ranson's criteria is 2.  Vital signs are normal.  I counseled him on the results of the work-up including the diagnoses of pancreatitis and hyperglycemia, the plan of care, the need for follow-up, and gave him strict return precautions; although he continued to be argumentative, he expressed understanding.      FINAL CLINICAL IMPRESSION(S) / ED DIAGNOSES   Final diagnoses:  Chronic pancreatitis, unspecified pancreatitis type ([place])  Hyperglycemia     Rx / DC Orders   ED Discharge Orders     None        Note:  This document was prepared using Dragon voice recognition software and may include unintentional dictation errors.       Arta Silence, MD 11/14/21 [phone]

## 2021-11-14 NOTE — ED Notes (Signed)
Pt is A&Ox4. Pt is laying laterally endorsing pain to abd. Pt had HX of pancreatitis that flares everyday, but he comes in when it gets really bad and "pills don't work"

## 2021-11-14 NOTE — Discharge Instructions (Addendum)
Your lipase, which is the pancreas enzyme, is mildly elevated today.  Your glucose is also elevated but we treated this with fluids.  Continue to take your pain medication at home as prescribed.  Avoid any alcohol or any foods that can flareup the pancreatitis.  Return to the ER immediately for new, worsening, or recurrent pain, nausea or vomiting, inability to hold anything down, fever, weakness, or any other new or worsening symptoms that concern you.  Follow-up with your regular doctor within the next week.

## 2021-11-26 ENCOUNTER — Encounter: Payer: Self-pay | Admitting: Emergency Medicine

## 2021-11-26 ENCOUNTER — Inpatient Hospital Stay
Admission: EM | Admit: 2021-11-26 | Discharge: 2021-11-26 | DRG: 439 | Disposition: A | Discharge status: Home / Self Care | Payer: No Typology Code available for payment source | Attending: Internal Medicine | Admitting: Internal Medicine

## 2021-11-26 ENCOUNTER — Other Ambulatory Visit: Payer: Self-pay

## 2021-11-26 DIAGNOSIS — D696 Thrombocytopenia, unspecified: Secondary | ICD-10-CM | POA: Diagnosis present

## 2021-11-26 DIAGNOSIS — K861 Other chronic pancreatitis: Secondary | ICD-10-CM | POA: Diagnosis present

## 2021-11-26 DIAGNOSIS — E785 Hyperlipidemia, unspecified: Secondary | ICD-10-CM | POA: Diagnosis present

## 2021-11-26 DIAGNOSIS — E1165 Type 2 diabetes mellitus with hyperglycemia: Secondary | ICD-10-CM | POA: Diagnosis present

## 2021-11-26 DIAGNOSIS — F419 Anxiety disorder, unspecified: Secondary | ICD-10-CM | POA: Diagnosis present

## 2021-11-26 DIAGNOSIS — Z7982 Long term (current) use of aspirin: Secondary | ICD-10-CM | POA: Diagnosis not present

## 2021-11-26 DIAGNOSIS — Z833 Family history of diabetes mellitus: Secondary | ICD-10-CM | POA: Diagnosis not present

## 2021-11-26 DIAGNOSIS — K8681 Exocrine pancreatic insufficiency: Secondary | ICD-10-CM | POA: Diagnosis present

## 2021-11-26 DIAGNOSIS — F32A Depression, unspecified: Secondary | ICD-10-CM | POA: Diagnosis present

## 2021-11-26 DIAGNOSIS — E119 Type 2 diabetes mellitus without complications: Secondary | ICD-10-CM | POA: Diagnosis not present

## 2021-11-26 DIAGNOSIS — Z794 Long term (current) use of insulin: Secondary | ICD-10-CM | POA: Diagnosis not present

## 2021-11-26 DIAGNOSIS — I5042 Chronic combined systolic (congestive) and diastolic (congestive) heart failure: Secondary | ICD-10-CM | POA: Diagnosis present

## 2021-11-26 DIAGNOSIS — Z885 Allergy status to narcotic agent status: Secondary | ICD-10-CM | POA: Diagnosis not present

## 2021-11-26 DIAGNOSIS — I110 Hypertensive heart disease with heart failure: Secondary | ICD-10-CM | POA: Diagnosis present

## 2021-11-26 DIAGNOSIS — K859 Acute pancreatitis without necrosis or infection, unspecified: Secondary | ICD-10-CM | POA: Diagnosis present

## 2021-11-26 DIAGNOSIS — Z8249 Family history of ischemic heart disease and other diseases of the circulatory system: Secondary | ICD-10-CM | POA: Diagnosis not present

## 2021-11-26 DIAGNOSIS — I482 Chronic atrial fibrillation, unspecified: Secondary | ICD-10-CM | POA: Diagnosis present

## 2021-11-26 DIAGNOSIS — Z9841 Cataract extraction status, right eye: Secondary | ICD-10-CM

## 2021-11-26 DIAGNOSIS — Z79899 Other long term (current) drug therapy: Secondary | ICD-10-CM

## 2021-11-26 DIAGNOSIS — G4733 Obstructive sleep apnea (adult) (pediatric): Secondary | ICD-10-CM | POA: Diagnosis present

## 2021-11-26 DIAGNOSIS — I10 Essential (primary) hypertension: Secondary | ICD-10-CM | POA: Diagnosis not present

## 2021-11-26 DIAGNOSIS — Z9842 Cataract extraction status, left eye: Secondary | ICD-10-CM

## 2021-11-26 DIAGNOSIS — Z961 Presence of intraocular lens: Secondary | ICD-10-CM | POA: Diagnosis present

## 2021-11-26 DIAGNOSIS — G8929 Other chronic pain: Secondary | ICD-10-CM | POA: Diagnosis present

## 2021-11-26 LAB — CBC
HCT: 40.6 % (ref 39.0–52.0)
Hemoglobin: 12.3 g/dL — ABNORMAL LOW (ref 13.0–17.0)
MCH: 26.6 pg (ref 26.0–34.0)
MCHC: 30.3 g/dL (ref 30.0–36.0)
MCV: 87.9 fL (ref 80.0–100.0)
Platelets: 148 10*3/uL — ABNORMAL LOW (ref 150–400)
RBC: 4.62 MIL/uL (ref 4.22–5.81)
RDW: 14.2 % (ref 11.5–15.5)
WBC: 7.8 10*3/uL (ref 4.0–10.5)
nRBC: 0 % (ref 0.0–0.2)

## 2021-11-26 LAB — URINALYSIS, COMPLETE (UACMP) WITH MICROSCOPIC
Bacteria, UA: NONE SEEN
Bilirubin Urine: NEGATIVE
Glucose, UA: >=500 mg/dL — AB
Hgb urine dipstick: NEGATIVE
Ketones, ur: 5 mg/dL — AB
Leukocytes,Ua: NEGATIVE
Nitrite: NEGATIVE
Protein, ur: 30 mg/dL — AB
Specific Gravity, Urine: 1.022 (ref 1.005–1.030)
pH: 5 (ref 5.0–8.0)

## 2021-11-26 LAB — BRAIN NATRIURETIC PEPTIDE: B Natriuretic Peptide: 198.9 pg/mL — ABNORMAL HIGH (ref 0.0–100.0)

## 2021-11-26 LAB — COMPREHENSIVE METABOLIC PANEL
ALT: 20 U/L (ref 0–44)
AST: 19 U/L (ref 15–41)
Albumin: 3.3 g/dL — ABNORMAL LOW (ref 3.5–5.0)
Alkaline Phosphatase: 64 U/L (ref 38–126)
Anion gap: 9 (ref 5–15)
BUN: 27 mg/dL — ABNORMAL HIGH (ref 8–23)
CO2: 28 mmol/L (ref 22–32)
Calcium: 8.6 mg/dL — ABNORMAL LOW (ref 8.9–10.3)
Chloride: 101 mmol/L (ref 98–111)
Creatinine, Ser: 1.02 mg/dL (ref 0.61–1.24)
GFR, Estimated: >60 mL/min (ref >60)
Glucose, Bld: 306 mg/dL — ABNORMAL HIGH (ref 70–99)
Potassium: 4.3 mmol/L (ref 3.5–5.1)
Sodium: 138 mmol/L (ref 135–145)
Total Bilirubin: 0.7 mg/dL (ref 0.3–1.2)
Total Protein: 8.1 g/dL (ref 6.5–8.1)

## 2021-11-26 LAB — LIPASE, BLOOD: Lipase: 181 U/L — ABNORMAL HIGH (ref 11–51)

## 2021-11-26 LAB — TRIGLYCERIDES: Triglycerides: 114 mg/dL (ref <150)

## 2021-11-26 LAB — CBG MONITORING, ED
Glucose-Capillary: 260 mg/dL — ABNORMAL HIGH (ref 70–99)
Glucose-Capillary: 249 mg/dL — ABNORMAL HIGH (ref 70–99)

## 2021-11-26 MED ORDER — TURMERIC 500 MG PO CAPS: ORAL_CAPSULE | Freq: Every day | ORAL | Status: DC

## 2021-11-26 MED ORDER — INSULIN ASPART 100 UNIT/ML IJ SOLN: 0.0000 [IU] | Freq: Every day | INTRAMUSCULAR | Status: DC

## 2021-11-26 MED ORDER — INSULIN GLARGINE-YFGN 100 UNIT/ML ~~LOC~~ SOLN: 30.0000 [IU] | Freq: Every day | SUBCUTANEOUS | Status: DC

## 2021-11-26 MED ORDER — ONDANSETRON HCL 4 MG/2ML IJ SOLN: 4.0000 mg | Freq: Three times a day (TID) | INTRAMUSCULAR | Status: DC | PRN

## 2021-11-26 MED ORDER — BENAZEPRIL HCL 10 MG PO TABS: 5.0000 mg | ORAL_TABLET | Freq: Every day | ORAL | Status: DC

## 2021-11-26 MED ORDER — INSULIN ASPART 100 UNIT/ML IJ SOLN
0.0000 [IU] | Freq: Three times a day (TID) | INTRAMUSCULAR | Status: DC
  Administered 2021-11-26: 5 [IU] via SUBCUTANEOUS
  Administered 2021-11-26: 3 [IU] via SUBCUTANEOUS
  Filled 2021-11-26 (×2): qty 1

## 2021-11-26 MED ORDER — CARVEDILOL 6.25 MG PO TABS: 3.1250 mg | ORAL_TABLET | Freq: Two times a day (BID) | ORAL | Status: DC

## 2021-11-26 MED ORDER — SODIUM CHLORIDE 0.9 % IV BOLUS
1000.0000 mL | Freq: Once | INTRAVENOUS | Status: AC
  Administered 2021-11-26: 1000 mL via INTRAVENOUS

## 2021-11-26 MED ORDER — OMEGA-3 1000 MG PO CAPS: 1.0000 | ORAL_CAPSULE | Freq: Every day | ORAL | Status: DC

## 2021-11-26 MED ORDER — VITAMIN B 12 100 MCG PO LOZG: LOZENGE | Freq: Every day | ORAL | Status: DC

## 2021-11-26 MED ORDER — METOPROLOL SUCCINATE ER 50 MG PO TB24
50.0000 mg | ORAL_TABLET | ORAL | Status: DC
Start: 2021-11-27 — End: 2021-11-26

## 2021-11-26 MED ORDER — ENOXAPARIN SODIUM 40 MG/0.4ML IJ SOSY: 40.0000 mg | PREFILLED_SYRINGE | INTRAMUSCULAR | Status: DC

## 2021-11-26 MED ORDER — HYDROMORPHONE HCL 1 MG/ML IJ SOLN
1.0000 mg | Freq: Once | INTRAMUSCULAR | Status: AC
  Administered 2021-11-26: 1 mg via INTRAVENOUS
  Filled 2021-11-26: qty 1

## 2021-11-26 MED ORDER — GABAPENTIN 600 MG PO TABS: 600.0000 mg | ORAL_TABLET | Freq: Three times a day (TID) | ORAL | Status: DC

## 2021-11-26 MED ORDER — HYDROMORPHONE HCL 1 MG/ML IJ SOLN
1.0000 mg | INTRAMUSCULAR | Status: DC | PRN
  Administered 2021-11-26: 1 mg via INTRAVENOUS
  Filled 2021-11-26: qty 1

## 2021-11-26 MED ORDER — HYDRALAZINE HCL 20 MG/ML IJ SOLN
5.0000 mg | INTRAMUSCULAR | Status: DC | PRN
  Administered 2021-11-26: 5 mg via INTRAVENOUS
  Filled 2021-11-26: qty 1

## 2021-11-26 MED ORDER — ONDANSETRON HCL 4 MG/2ML IJ SOLN
4.0000 mg | Freq: Once | INTRAMUSCULAR | Status: AC
Start: 2021-11-26 — End: 2021-11-26
  Administered 2021-11-26: 4 mg via INTRAVENOUS
  Filled 2021-11-26: qty 2

## 2021-11-26 MED ORDER — ACETAMINOPHEN 325 MG PO TABS: 650.0000 mg | ORAL_TABLET | Freq: Four times a day (QID) | ORAL | Status: DC | PRN

## 2021-11-26 MED ORDER — DIGOXIN 125 MCG PO TABS: 125.0000 ug | ORAL_TABLET | ORAL | Status: DC

## 2021-11-26 MED ORDER — ENOXAPARIN SODIUM 60 MG/0.6ML IJ SOSY: 0.5000 mg/kg | PREFILLED_SYRINGE | INTRAMUSCULAR | Status: DC

## 2021-11-26 MED ORDER — SODIUM CHLORIDE 0.9 % IV SOLN: INTRAVENOUS | Status: DC

## 2021-11-26 MED ORDER — CARBOXYMETHYLCELLULOSE SOD PF 0.5 % OP SOLN: 1.0000 [drp] | Freq: Every day | OPHTHALMIC | Status: DC

## 2021-11-26 MED ORDER — OXYCODONE-ACETAMINOPHEN 5-325 MG PO TABS
1.0000 | ORAL_TABLET | ORAL | Status: DC | PRN
  Filled 2021-11-26: qty 1

## 2021-11-26 MED ORDER — ASPIRIN 81 MG PO TBEC: 81.0000 mg | DELAYED_RELEASE_TABLET | Freq: Every day | ORAL | Status: DC

## 2021-11-26 MED ORDER — CHOLECALCIFEROL 50 MCG (2000 UT) PO TABS: 1.0000 | ORAL_TABLET | Freq: Every day | ORAL | Status: DC

## 2021-11-26 MED ORDER — PANCRELIPASE (LIP-PROT-AMYL) 24000-76000 UNITS PO CPEP
24000.0000 [IU] | ORAL_CAPSULE | Freq: Three times a day (TID) | ORAL | Status: DC
Start: 2021-11-26 — End: 2021-11-26

## 2021-11-26 NOTE — Progress Notes (Signed)
PHARMACIST - PHYSICIAN COMMUNICATION  CONCERNING:  Enoxaparin (Lovenox) for DVT Prophylaxis    RECOMMENDATION: Patient was prescribed enoxaprin '40mg'$  q24 hours for VTE prophylaxis.   Filed Weights   11/26/21 0917  Weight: 117.9 kg (260 lb)    Body mass index is 35.26 kg/m.  Estimated Creatinine Clearance: 94.4 mL/min (by C-G formula based on SCr of 1.02 mg/dL).   Based on [place] patient is candidate for enoxaparin 0.'5mg'$ /kg TBW SQ every 24 hours based on BMI being >30.   DESCRIPTION: Pharmacy has adjusted enoxaparin dose per [facility] policy.  Patient is now receiving enoxaparin 60 mg every 24 hours    Pernell Dupre, PharmD Clinical Pharmacist  11/26/2021 11:51 AM

## 2021-11-26 NOTE — ED Notes (Signed)
Paduchowski, MD aware of patient asking for more pain medication.

## 2021-11-26 NOTE — ED Notes (Signed)
Pt updated on plan; pt notified this RN waiting for orders to be verified by pharm; pt reports L sided chronic abd pain that radiates to his back; reports it is a 9/10 right now; pt's resp reg/unlabored but pt remains on O2 via [place] as requested, able to speak in full sentences, skin dry and calmly laying on stretcher. Pt currently in Afib on cardiac monitor; pt reports history of this but denies being on anticoagulant/blood thinner as states he was told it will negatively effect one of his other meds and so wasn't prescribed one. Family remains at bedside with pt.

## 2021-11-26 NOTE — Discharge Summary (Signed)
Physician Discharge Summary  [name] QMV:[id number] DOB: 1956/02/02 DOA: 11/26/2021  PCP: Charlsie Merles, MD  Admit date: 11/26/2021 Discharge date: 11/26/2021  Recommendations for Outpatient Follow-up:  Follow up with PCP in 5 days  Home Health: none Equipment/Devices: none  Discharge Condition: improved abdominal pain CODE STATUS: full Diet recommendation: NPO or clear diet, advance diet slowly  Brief/Interim Summary (HPI)   Don Brown is a 66 y.o. male with medical history significant of chronic pancreatitis, pancreatic fistula, hypertension, hyperlipidemia, diabetes mellitus, depression, anxiety, thrombocytopenia, OSA not on CPAP, CHF with EF 20-25%, atrial fibrillation on digoxin (not on anticoagulants), anemia, on prn 2 to 3 L oxygen at home, who presents with abdominal pain.   Patient states that he has chronic chronic pancreatitis with abdominal pain.  His abdominal pain has been progressively worsening since this morning.  It is located in left upper quadrant, sharp, severe, 10 out of 10 in severity, sharp, nonradiating.  Associated with nausea, 1 episode of nonbilious nonbloody vomiting.  No diarrhea.  No fever or chills.  Patient has mild shortness breath, no chest pain or cough.  No symptoms of UTI.   Data reviewed independently and ED Course: pt was found to have lipase 181, WBC 7.8, GFR> 60, triglyceride level 114, BNP 198, temperature normal, blood pressure 142/70, heart rate 83, RR 18, oxygen saturation 98% on room air. Pt is admitted to telemetry bed as inpatient   EKG: I have personally reviewed.  Sinus rhythm, QTc 442, nonspecific T wave change    Subjective  -Abdominal pain, improved significantly  Discharge Diagnoses and Hospital Course:   Principal Problem:   Acute on chronic pancreatitis ([place]) Active Problems:   Chronic atrial fibrillation (H[facility])   Chronic combined systolic and diastolic CHF (congestive heart failure) (HCC)   HTN (hypertension), benign    Thrombocytopenia (HCC)   HLD (hyperlipidemia)   Diabetes mellitus without complication (HCC)  Assessment and Plan: * Acute on chronic pancreatitis ([place]):  Lipase 181. Patient initially patient had severe left upper quadrant abdominal pain, which has improved with IV Dilaudid and Percocet treatment.  In late afternoon, patient states that his abdominal pain has resolved.  No nausea vomiting or diarrhea.  He strongly wants to go home.  Patient is stable and safe to go home.   -Will discharge home -Follow-up with PCP in 5 days -Patient is advised to keep n.p.o. tonight, and start clear diet tomorrow, advance diet slowly. -Continue home as needed Dilaudid   Chronic atrial fibrillation [facility] [place]) Patient is not taking anticoagulants.  Heart rate 83 -Continue digoxin, Coreg   Diabetes mellitus without complication (HCC) Recent A1c 9.1, poorly controlled.  Patient is is taking NovoLog and Lantus 40 units daily --> will continue home meds -Sliding scale insulin in hospital -Glargine insulin 30 units daily in hospital   HLD (hyperlipidemia) - Patient's not taking Zocor currently -Follow-up with PCP   Thrombocytopenia ([place]) This is chronic issue.  Platelet 148. -Follow-up with CBC   HTN (hypertension), benign - IV hydralazine as needed in hospital -Coreg, Lotensin,   Chronic combined systolic and diastolic CHF (congestive heart failure) ([place]) 2D echo on 07/18/2021 showed EF of 20-25%.  Patient has trace leg edema, BNP 198.  Does not seem to have CHF exacerbation. -Hold Lasix due to pancreatitis -Watch volume status closely            Discharge Instructions  Discharge Instructions     Call MD for:  persistant nausea and vomiting   Complete by:  As directed    Call MD for:  severe uncontrolled pain   Complete by: As directed    Call MD for:  temperature >100.4   Complete by: As directed    Diet clear liquid   Complete by: As directed    Increase activity slowly   Complete by:  As directed       Allergies as of 11/26/2021       Reactions   Morphine And Related Nausea And Vomiting        Medication List     STOP taking these medications    folic acid 1 MG tablet Commonly known as: FOLVITE   HumuLIN N KwikPen 100 UNIT/ML Kiwkpen Generic drug: Insulin NPH (Human) ([place])   metoprolol succinate 50 MG 24 hr tablet Commonly known as: TOPROL-XL   potassium chloride SA 20 MEQ tablet Commonly known as: KLOR-CON M       TAKE these medications    APPLE CIDER VINEGAR PO Take 1 tablet by mouth 3 (three) times daily.   aspirin 81 MG tablet Take 81 mg by mouth daily before breakfast.   benazepril 10 MG tablet Commonly known as: LOTENSIN Take 10 mg by mouth daily. Take 1/2 tablet daily by mouth   Carboxymethylcellulose Sod PF 0.5 % Soln Place 1 drop into both eyes 5 (five) times daily.   carvedilol 6.25 MG tablet Commonly known as: COREG Take 3.125 mg by mouth 2 (two) times daily.   Cholecalciferol 50 MCG (2000 UT) Tabs Take 1 tablet by mouth daily.   digoxin 0.125 MG tablet Commonly known as: LANOXIN Take 1 tablet (125 mcg total) by mouth every morning.   FreeStyle Libre 2 Sensor Misc 1 Device by Does not apply route every 14 (fourteen) days.   gabapentin 600 MG tablet Commonly known as: NEURONTIN Take 600 mg by mouth 3 (three) times daily.   GINSENG COMPLEX PO Take 1 tablet by mouth daily.   HYDROmorphone 2 MG tablet Commonly known as: DILAUDID Take 2 mg by mouth 4 (four) times daily as needed for severe pain.   insulin aspart 100 UNIT/ML injection Commonly known as: novoLOG Inject into the skin 3 (three) times daily before meals. (According to sliding scale)   insulin glargine 100 UNIT/ML injection Commonly known as: LANTUS Inject 40 Units into the skin daily.   Omega-3 1000 MG Caps Take 1 capsule by mouth daily.   OXcarbazepine 150 MG tablet Commonly known as: TRILEPTAL Take 150 mg by mouth 2 (two) times daily. Take  2 tablets by mouth 3 times daily for headaches.   Pancrelipase (Lip-Prot-Amyl) 24000-76000 units Cpep Take 24,000 Units by mouth 3 (three) times daily.   sildenafil 100 MG tablet Commonly known as: VIAGRA Take 50 mg by mouth daily as needed for erectile dysfunction.   triamcinolone cream 0.1 % Commonly known as: KENALOG Apply 1 Application topically 2 (two) times daily as needed (rash).   TURMERIC PO Take 1 tablet by mouth daily.   VITAMIN B 12 PO Take 1 tablet by mouth daily.        Follow-up Information     Borum, Jaci Standard, MD Follow up.   Specialty: Internal Medicine Contact information: [place] [address] [phone]                Allergies  Allergen Reactions   Morphine And Related Nausea And Vomiting    Consultations: none   Procedures/Studies: No results found.    Discharge Exam: Vitals:  11/26/21 1700 11/26/21 1823  BP: (!) 132/94 126/78  Pulse: 70 77  Resp: 12 17  Temp:  98.4 F (36.9 C)  SpO2: 96% 100%   Vitals:   11/26/21 1600 11/26/21 1630 11/26/21 1700 11/26/21 1823  BP:  135/84 (!) 132/94 126/78  Pulse: 78 74 70 77  Resp: '18 18 12 17  '$ Temp:    98.4 F (36.9 C)  TempSrc:    Oral  SpO2: 100% 100% 96% 100%  Weight:        General: Not in acute distress HEENT:       Eyes: PERRL, EOMI, no scleral icterus.       ENT: No discharge from the ears and nose, no pharynx injection, no tonsillar enlargement.        Neck: No JVD, no bruit, no mass felt. Heme: No neck lymph node enlargement. Cardiac: S1/S2, RRR,  No gallops or rubs. Respiratory: No rales, wheezing, rhonchi or rubs. GI: Soft, nondistended, nontender, no rebound pain, no organomegaly, BS present. GU: No hematuria Ext: trace leg edema bilaterally. 1+DP/PT pulse bilaterally. Musculoskeletal: No joint deformities, No joint redness or warmth, no limitation of ROM in spin. Skin: No rashes.  Neuro: Alert, oriented X3, cranial nerves  II-XII grossly intact, moves all extremities normally.  Psych: Patient is not psychotic, no suicidal or hemocidal ideation.     The results of significant diagnostics from this hospitalization (including imaging, microbiology, ancillary and laboratory) are listed below for reference.     Microbiology: No results found for this or any previous visit (from the past 240 hour(s)).   Labs: BNP (last 3 results) Recent Labs    07/18/21 1107 11/26/21 0921  BNP 263.6* 852.7*   Basic Metabolic Panel: Recent Labs  Lab 11/26/21 0921  NA 138  K 4.3  CL 101  CO2 28  GLUCOSE 306*  BUN 27*  CREATININE 1.02  CALCIUM 8.6*   Liver Function Tests: Recent Labs  Lab 11/26/21 0921  AST 19  ALT 20  ALKPHOS 64  BILITOT 0.7  PROT 8.1  ALBUMIN 3.3*   Recent Labs  Lab 11/26/21 0921  LIPASE 181*   No results for input(s): "AMMONIA" in the last 168 hours. CBC: Recent Labs  Lab 11/26/21 0921  WBC 7.8  HGB 12.3*  HCT 40.6  MCV 87.9  PLT 148*   Cardiac Enzymes: No results for input(s): "CKTOTAL", "CKMB", "CKMBINDEX", "TROPONINI" in the last 168 hours. BNP: Invalid input(s): "POCBNP" CBG: Recent Labs  Lab 11/26/21 1157 11/26/21 1611  GLUCAP 260* 249*   D-Dimer No results for input(s): "DDIMER" in the last 72 hours. Hgb A1c No results for input(s): "HGBA1C" in the last 72 hours. Lipid Profile Recent Labs    11/26/21 0921  TRIG 114   Thyroid function studies No results for input(s): "TSH", "T4TOTAL", "T3FREE", "THYROIDAB" in the last 72 hours.  Invalid input(s): "FREET3" Anemia work up No results for input(s): "VITAMINB12", "FOLATE", "FERRITIN", "TIBC", "IRON", "RETICCTPCT" in the last 72 hours. Urinalysis    Component Value Date/Time   COLORURINE YELLOW (A) 11/26/2021 0921   APPEARANCEUR CLEAR (A) 11/26/2021 0921   LABSPEC 1.022 11/26/2021 0921   PHURINE 5.0 11/26/2021 0921   GLUCOSEU >=500 (A) 11/26/2021 0921   HGBUR NEGATIVE 11/26/2021 0921   BILIRUBINUR  NEGATIVE 11/26/2021 0921   KETONESUR 5 (A) 11/26/2021 0921   PROTEINUR 30 (A) 11/26/2021 0921   UROBILINOGEN 1.0 01/03/2015 0705   NITRITE NEGATIVE 11/26/2021 0921   LEUKOCYTESUR NEGATIVE 11/26/2021 0921   Sepsis  Labs Recent Labs  Lab 11/26/21 0921  WBC 7.8   Microbiology No results found for this or any previous visit (from the past 240 hour(s)).  Time coordinating discharge:  25 minutes.   SIGNED:  Ivor Costa, MD [facility] 11/26/2021, 6:45 PM   If 7PM-7AM, please contact night-coverage [email or web address]

## 2021-11-26 NOTE — Assessment & Plan Note (Signed)
2D echo on 07/18/2021 showed EF of 20-25%.  Patient has trace leg edema, BNP 198.  Does not seem to have CHF exacerbation. -Hold Lasix due to pancreatitis -Watch volume status closely

## 2021-11-26 NOTE — Discharge Instructions (Signed)
You were cared for by a hospitalist during your hospital stay. If you have any questions about your discharge medications or the care you received while you were in the hospital after you are discharged, you can call the unit and ask to speak with the hospitalist on call if the hospitalist that took care of you is not available. Once you are discharged, your primary care physician will handle any further medical issues. Please note that NO REFILLS for any discharge medications will be authorized once you are discharged, as it is imperative that you return to your primary care physician (or establish a relationship with a primary care physician if you do not have one) for your aftercare needs so that they can reassess your need for medications and monitor your lab values.  Follow up with PCP in 5 days. Please take clear diet starting tomorrow, and advance diet very slowly. If your abdominal pain gets worse, please stop eating solid food. Take all medications as prescribed. If symptoms change or worsen please return to the ED for evaluation

## 2021-11-26 NOTE — ED Notes (Signed)
Explained to pt why giving PRN BP med; pt complaining stating he is here for pain control and doesn't understand why staff is "trying to control everything but my pain"; my educated again; pt notified he has right to refuse treatment if he so chooses; pt did not refuse BP med once educated though was still frustrated with situation; pt then complained about urine specimen being collected and stated his frustration about "running up a bill"; explained pt can refuse for urine to be sent to lab; pt educated; pt did not confirm to send urine but also did not refuse to have it sent as he wouldn't reply when this RN asked him; urine sent to lab. Pt's family remains at bedside.

## 2021-11-26 NOTE — Assessment & Plan Note (Signed)
Patient is not taking anticoagulants.  Heart rate 83 -Continue digoxin, Coreg

## 2021-11-26 NOTE — Assessment & Plan Note (Signed)
Lipase 181. Patient initially patient had severe left upper quadrant abdominal pain, which has improved with IV Dilaudid and Percocet treatment.  -will admit to tele bed as inpt. -As needed Dilaudid and Percocet for pain -IV fluid: Patient received 1 L normal saline, will continue 50 cc/h (patient has EF of 20-25%, limiting aggressive IV fluid treatment) -As needed Zofran -N.p.o. -recheck lipase

## 2021-11-26 NOTE — Assessment & Plan Note (Signed)
Recent A1c 9.1, poorly controlled.  Patient is is taking NovoLog and Lantus 40 units daily -Sliding scale insulin -Glargine insulin 30 units daily

## 2021-11-26 NOTE — Assessment & Plan Note (Signed)
-   IV hydralazine as needed -Coreg, Lotensin,

## 2021-11-26 NOTE — ED Notes (Signed)
Patient argumentative with this RN and Paduchowski, MD. Patient states that we "are not giving him enough pain medication" and that he "is miserable." Patient states that 1 mg of dilaudid is not enough. Attempted to give patient second dose of dilaudid and patient arguing with RN that it is not a high enough dose. MD educated patient and explained we are trying to give patient more medication at this time.

## 2021-11-26 NOTE — ED Notes (Signed)
MD, Blaine Hamper at bedside.

## 2021-11-26 NOTE — ED Notes (Signed)
Pt requesting to speak with pt advocate as states feels is being under-medicated. Explained Lou-ann not here today but can have charge RN come speak with him for a second time. Sam, RN to bedside.

## 2021-11-26 NOTE — ED Triage Notes (Signed)
Patient to ED via [facility] from home for abd pain. Patient has known pancreatitis with prescription for dilaudid and has been taking with no relief. Patient wears 3L PRN at home. Patient refused IV fentanyl with EMS.

## 2021-11-26 NOTE — ED Provider Notes (Signed)
[facility] Provider Note    Event Date/Time   First MD Initiated Contact with Patient 11/26/21 [phone]     (approximate)  History   Chief Complaint: Abdominal Pain  HPI  Don Brown is a 66 y.o. male with a past medical history of CHF, chronic pain, chronic pancreatitis, presents to the emergency department for upper abdominal pain.  According to the patient he is prescribed Dilaudid at home to take when he has flares of pancreatitis.  He states for the last few days he has been experiencing significant upper abdominal pain consistent with prior pancreatitis flares.  Has tried his home pain medication without relief so he came to the emergency department.  Patient states nausea and vomiting over the past day or 2 denies any diarrhea denies any fever.  Physical Exam   Triage Vital Signs: ED Triage Vitals  Enc Vitals Group     BP 11/26/21 0919 127/72     Pulse Rate 11/26/21 0919 83     Resp 11/26/21 0919 18     Temp 11/26/21 0919 98.4 F (36.9 C)     Temp Source 11/26/21 0919 Oral     SpO2 11/26/21 0919 94 %     Weight 11/26/21 0917 260 lb (117.9 kg)     Height --      Head Circumference --      Peak Flow --      Pain Score 11/26/21 0917 10     Pain Loc --      Pain Edu? --      Excl. in [place]? --     Most recent vital signs: Vitals:   11/26/21 0919  BP: 127/72  Pulse: 83  Resp: 18  Temp: 98.4 F (36.9 C)  SpO2: 94%    General: Awake, no distress.  CV:  Good peripheral perfusion.  Regular rate and rhythm  Resp:  Normal effort.  Equal breath sounds bilaterally.  Abd:  No distention.  Soft, moderate epigastric tenderness.  Patient has old abdominal incisional scars   ED Results / Procedures / Treatments   EKG  EKG viewed and interpreted by myself shows atrial fibrillation at 76 bpm with a narrow QRS, normal axis, normal intervals, nonspecific ST changes.   MEDICATIONS ORDERED IN ED: Medications  sodium chloride 0.9 % bolus 1,000 mL (has  no administration in time range)  HYDROmorphone (DILAUDID) injection 1 mg (has no administration in time range)  ondansetron (ZOFRAN) injection 4 mg (has no administration in time range)     IMPRESSION / MDM / ASSESSMENT AND PLAN / ED COURSE  I reviewed the triage vital signs and the nursing notes.  Patient's presentation is most consistent with acute presentation with potential threat to life or bodily function.  Patient presents emergency department for upper abdominal pain consistent with past episodes of pancreatitis.  Patient states today's episode feels very consistent with pancreatitis as well.  Home pain medication has not been helpful for the patient so he came to the emergency department.  We will check labs including LFTs and lipase.  We will treat pain nausea and IV hydrate while awaiting results.  Patient agreeable to plan of care.  Patient's labs show a normal CBC, chemistry shows mild hyperglycemia otherwise reassuring findings.  Patient's lipase is elevated consistent with pancreatitis which also fits the patient's clinical picture.  I have reevaluated the patient he is very upset that we are not giving him adequate amounts of pain medication.  Patient has  already received 2 1 mg doses of IV Dilaudid within 1.5 hours.  Patient is upset that we are not giving him larger amounts of Dilaudid.  Had a very long conversation with the patient regarding admission to the hospital.  Patient states he does not want to be admitted to the hospital at this time.  I discussed with the patient that as I have already given him 2 doses of IV pain medication and he continues to state that his pain is unchanged from arrival I encouraged admission to the hospital for bowel rest and continued pain control.  At this time the patient is unwilling to say whether or not he wants to be admitted to the hospital so I will hold off on consulting the hospitalist.  He asked that I come back and check on him in 30 or 45  minutes to see if the pain medication is working so that he may then decide if he wants to be admitted to the hospital.  Throughout our conversation patient continued to request more pain medication despite having just received 1 mg of IV Dilaudid literally during our conversation.  I do not question the patient is in discomfort but I discussed with the patient that pancreatitis often times will take several days of bowel rest in addition to pain medication before it improves, however patient still will not state if he wants to be admitted or not.    I have reassessed the patient.  He continues to state pain.  I discussed with the patient I believe he would most benefit from admission to the hospital for continued pain control as well as bowel rest.  Patient is agreeable to this plan.  I have spoken to the hospitalist who is excepted to their service.  FINAL CLINICAL IMPRESSION(S) / ED DIAGNOSES   Upper abdominal pain Pancreatitis   Note:  This document was prepared using Dragon voice recognition software and may include unintentional dictation errors.   Harvest Dark, MD 11/26/21 [phone]

## 2021-11-26 NOTE — Assessment & Plan Note (Signed)
-   Patient's not taking Zocor currently -Follow-up with PCP

## 2021-11-26 NOTE — ED Notes (Signed)
Urinal attached to bedrail. Pt confirms he has urinated today. No urine currently noted in urinal to send to lab.

## 2021-11-26 NOTE — ED Notes (Addendum)
Pt requesting to talk with attending again; attending notified via secure chat. Pt threatening to leave AMA. Pt unhappy with how much and how frequently he is able to have IV pain meds; pt educated again. Attending confirms already explained to pt why he ordered pt's pain meds at current dosages and PRN time-frames.

## 2021-11-26 NOTE — Assessment & Plan Note (Signed)
This is chronic issue.  Platelet 148. -Follow-up with CBC

## 2021-11-26 NOTE — H&P (Signed)
History and Physical    Don Brown ZDG:[id number] DOB: [birth year] DOA: 11/26/2021  Referring MD/NP/PA:   PCP: Charlsie Merles, MD   Patient coming from:  The patient is coming from home.  At baseline, pt is independent for most of ADL.        Chief Complaint: abdominal pain  HPI: Don Brown is a 66 y.o. male with medical history significant of chronic pancreatitis, pancreatic fistula, hypertension, hyperlipidemia, diabetes mellitus, depression, anxiety, thrombocytopenia, OSA not on CPAP, CHF with EF 20-25%, atrial fibrillation on digoxin (not on anticoagulants), anemia, on prn 2 to 3 L oxygen at home, who presents with abdominal pain.  Patient states that he has chronic chronic pancreatitis with abdominal pain.  His abdominal pain has been progressively worsening since this morning.  It is located in left upper quadrant, sharp, severe, 10 out of 10 in severity, sharp, nonradiating.  Associated with nausea, 1 episode of nonbilious nonbloody vomiting.  No diarrhea.  No fever or chills.  Patient has mild shortness breath, no chest pain or cough.  No symptoms of UTI.  Data reviewed independently and ED Course: pt was found to have lipase 181, WBC 7.8, GFR> 60, triglyceride level 114, BNP 198, temperature normal, blood pressure 142/70, heart rate 83, RR 18, oxygen saturation 98% on room air. Pt is admitted to telemetry bed as inpatient  EKG: I have personally reviewed.  Sinus rhythm, QTc 442, nonspecific T wave change   Review of Systems:   General: no fevers, chills, no body weight gain, has poor appetite, has fatigue HEENT: no blurry vision, hearing changes or sore throat Respiratory: has mild dyspnea, no coughing, wheezing CV: no chest pain, no palpitations GI: has nausea, vomiting, abdominal pain, no diarrhea, constipation GU: no dysuria, burning on urination, increased urinary frequency, hematuria  Ext: has traced leg edema Neuro: no unilateral weakness, numbness, or tingling, no  vision change or hearing loss Skin: no rash, no skin tear. MSK: No muscle spasm, no deformity, no limitation of range of movement in spin Heme: No easy bruising.  Travel history: No recent long distant travel.   Allergy:  Allergies  Allergen Reactions   Morphine And Related Nausea And Vomiting    Past Medical History:  Diagnosis Date   Anemia    Anxiety    Atrial fibrillation (HCC)    Cervical compression fracture (HCC)    Cervical disk disease requiring surgery   CHF (congestive heart failure) (HCC)    Chronic pain    Chronic systolic heart failure (HCC)    LVEF 40-45%   Degenerative joint disease    Depression    Essential hypertension, benign    Headache    "stress"   Heart murmur    History of blood transfusion 2007   "related to OR"   History of renal failure    Requiring hemodialysis in the past with normal renal function at this point   History of ventral hernia repair    Hyperlipidemia    MGUS (monoclonal gammopathy of unknown significance)    "or myelona" (05/07/2014)   Mitral valve prolapse    Morbid obesity ([place])    Myeloma ([place])    "or MGUS" (05/07/2014)   Narcotic dependence, episodic use ([place])    On home oxygen therapy    "2L prn" (05/07/2014)   Pancreatitis    Severe pancreatitis status post surgery with 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 status post multiple endoscopic drainage procedures and stents    Respiratory failure ([place])  History of ventilatory-dependent respiratory failure and tracheostomy    Sleep apnea    "don't wear mask" (05/07/2014)   Type 2 diabetes mellitus [facility])     Past Surgical History:  Procedure Laterality Date   BONE MARROW BIOPSY  02/2014   CATARACT EXTRACTION W/ INTRAOCULAR LENS  IMPLANT, BILATERAL Bilateral    CHOLECYSTECTOMY     HERNIA REPAIR     INSERTION OF MESH  10/2006   IR RADIOLOGIST EVAL & MGMT  08/18/2017   PANCREAS SURGERY     "placed stents; multiple pseudo cysts removal"   SKIN GRAFT      TRACHEOSTOMY  2007   VENTRAL HERNIA REPAIR      Social History:  reports that he has never smoked. He has never used smokeless tobacco. He reports that he does not drink alcohol and does not use drugs.  Family History:  Family History  Problem Relation Age of Onset   Lymphoma Father    Hypertension Mother    Diabetes Mother      Prior to Admission medications   Medication Sig Start Date End Date Taking? Authorizing Provider  APPLE CIDER VINEGAR PO Take 1 tablet by mouth 3 (three) times daily.    [provider]  aspirin 81 MG tablet Take 81 mg by mouth daily before breakfast.     [provider]  benazepril (LOTENSIN) 10 MG tablet Take 10 mg by mouth daily. Take 1/2 tablet daily by mouth    [provider]  Carboxymethylcellulose Sod PF 0.5 % SOLN Apply 1 drop to eye 5 (five) times daily. 1 drop in each eye 5 times/day    [provider]  carvedilol (COREG) 25 MG tablet Take 25 mg by mouth daily. Takes 1/2 tablet by mouth bid.    [provider]  Cholecalciferol 2000 units TABS Take 1 tablet by mouth daily.    [provider]  Continuous Blood Gluc Sensor (FREESTYLE LIBRE 2 SENSOR) MISC 1 Device by Does not apply route every 14 (fourteen) days. 02/20/21   Renato Shin, MD  Cyanocobalamin (VITAMIN B 12 PO) Take 1 tablet by mouth daily.     [provider]  digoxin (LANOXIN) 0.125 MG tablet Take 1 tablet (125 mcg total) by mouth every morning. 02/12/14   Eugenie Filler, MD  escitalopram (LEXAPRO) 10 MG tablet Take 10 mg by mouth daily. Patient not taking: Reported on 07/19/2021    [provider]  folic acid (FOLVITE) 1 MG tablet Take 1 tablet (1 mg total) by mouth daily. Patient not taking: Reported on 07/18/2021 05/17/13   Thurnell Lose, MD  furosemide (LASIX) 40 MG tablet Take 2 tablets (80 mg total) by mouth 2 (two) times daily for 7 days. 07/20/21 07/27/21  Enzo Bi, MD  gabapentin (NEURONTIN) 600 MG tablet  Take 600 mg by mouth 3 (three) times daily.    [provider]  HYDROmorphone (DILAUDID) 2 MG tablet Take 2 mg by mouth every 4 (four) hours as needed for severe pain.    [provider]  insulin aspart (NOVOLOG) 100 UNIT/ML injection Inject into the skin 3 (three) times daily before meals.    [provider]  Insulin NPH, Human,, Isophane, (HUMULIN N KWIKPEN) 100 UNIT/ML Kiwkpen Inject 75 Units into the skin every morning. And pen needles 1/day. Veteran says visual loss prevents him from using syringe and vial. 05/06/21   Renato Shin, MD  metoprolol succinate (TOPROL-XL) 50 MG 24 hr tablet Take 1 tablet (50  mg total) by mouth every morning. Patient not taking: Reported on 07/18/2021 02/12/14   Eugenie Filler, MD  Misc Natural Products [facility] COMPLEX PO) Take 1 tablet by mouth daily.    [provider]  Omega-3 1000 MG CAPS Take 1 capsule by mouth daily.    [provider]  ondansetron (ZOFRAN ODT) 4 MG disintegrating tablet Take 1 tablet (4 mg total) every 8 (eight) hours as needed by mouth for nausea or vomiting. Patient not taking: Reported on 07/18/2021 02/08/17   Darel Hong, MD  OXcarbazepine (TRILEPTAL) 150 MG tablet Take 150 mg by mouth 2 (two) times daily. Take 2 tablets by mouth 3 times daily for headaches.    [provider]  Oxycodone HCl 10 MG TABS Take 1 tablet (10 mg total) every 6 (six) hours as needed by mouth. 02/08/17   Darel Hong, MD  Pancrelipase, Lip-Prot-Amyl, 24000-76000 units CPEP Take 24,000 Units by mouth 3 (three) times daily.    [provider]  potassium chloride SA (K-DUR,KLOR-CON) 20 MEQ tablet Take 2 tablets (40 mEq total) by mouth daily. Patient taking differently: Take 20 mEq by mouth daily. 02/12/14   Eugenie Filler, MD  sildenafil (VIAGRA) 100 MG tablet Take 100 mg by mouth daily as needed for erectile dysfunction.    [provider]  simvastatin (ZOCOR) 80 MG tablet Take 80  mg by mouth daily. Patient not taking: Reported on 07/19/2021    [provider]  thiamine 100 MG tablet Take 1 tablet (100 mg total) by mouth daily. 05/17/13   Thurnell Lose, MD  triamcinolone cream (KENALOG) 0.1 % Apply 1 application topically 2 (two) times daily. Apply as directed to affected area bid.    [provider]  TURMERIC PO Take 1 tablet by mouth daily.    [provider]    Physical Exam: Vitals:   11/26/21 1600 11/26/21 1630 11/26/21 1700 11/26/21 1823  BP:  135/84 (!) 132/94 126/78  Pulse: 78 74 70 77  Resp: '18 18 12 17  ' Temp:    98.4 F (36.9 C)  TempSrc:    Oral  SpO2: 100% 100% 96% 100%  Weight:       General: Not in acute distress HEENT:       Eyes: PERRL, EOMI, no scleral icterus.       ENT: No discharge from the ears and nose, no pharynx injection, no tonsillar enlargement.        Neck: No JVD, no bruit, no mass felt. Heme: No neck lymph node enlargement. Cardiac: S1/S2, RRR, No gallops or rubs. Respiratory: No rales, wheezing, rhonchi or rubs. GI: Soft, nondistended, has tenderness in LUQ, no rebound pain, no organomegaly, BS present. Has a large surgical scar GU: No hematuria Ext: has trace leg edema bilaterally. 1+DP/PT pulse bilaterally. Musculoskeletal: No joint deformities, No joint redness or warmth, no limitation of ROM in spin. Skin: No rashes.  Neuro: Alert, oriented X3, cranial nerves II-XII grossly intact, moves all extremities normally.  Psych: Patient is not psychotic, no suicidal or hemocidal ideation.  Labs on Admission: I have personally reviewed following labs and imaging studies  CBC: Recent Labs  Lab 11/26/21 0921  WBC 7.8  HGB 12.3*  HCT 40.6  MCV 87.9  PLT 353*   Basic Metabolic Panel: Recent Labs  Lab 11/26/21 0921  NA 138  K 4.3  CL 101  CO2 28  GLUCOSE 306*  BUN 27*  CREATININE 1.02  CALCIUM 8.6*   GFR:  Estimated Creatinine Clearance: 94.4 mL/min (by C-G formula based on SCr of  1.02 mg/dL). Liver Function Tests: Recent Labs  Lab 11/26/21 0921  AST 19  ALT 20  ALKPHOS 64  BILITOT 0.7  PROT 8.1  ALBUMIN 3.3*   Recent Labs  Lab 11/26/21 0921  LIPASE 181*   No results for input(s): "AMMONIA" in the last 168 hours. Coagulation Profile: No results for input(s): "INR", "PROTIME" in the last 168 hours. Cardiac Enzymes: No results for input(s): "CKTOTAL", "CKMB", "CKMBINDEX", "TROPONINI" in the last 168 hours. BNP (last 3 results) No results for input(s): "PROBNP" in the last 8760 hours. HbA1C: No results for input(s): "HGBA1C" in the last 72 hours. CBG: Recent Labs  Lab 11/26/21 1157 11/26/21 1611  GLUCAP 260* 249*   Lipid Profile: Recent Labs    11/26/21 0921  TRIG 114   Thyroid Function Tests: No results for input(s): "TSH", "T4TOTAL", "FREET4", "T3FREE", "THYROIDAB" in the last 72 hours. Anemia Panel: No results for input(s): "VITAMINB12", "FOLATE", "FERRITIN", "TIBC", "IRON", "RETICCTPCT" in the last 72 hours. Urine analysis:    Component Value Date/Time   COLORURINE YELLOW (A) 11/26/2021 0921   APPEARANCEUR CLEAR (A) 11/26/2021 0921   LABSPEC 1.022 11/26/2021 0921   PHURINE 5.0 11/26/2021 0921   GLUCOSEU >=500 (A) 11/26/2021 0921   HGBUR NEGATIVE 11/26/2021 0921   BILIRUBINUR NEGATIVE 11/26/2021 0921   KETONESUR 5 (A) 11/26/2021 0921   PROTEINUR 30 (A) 11/26/2021 0921   UROBILINOGEN 1.0 01/03/2015 0705   NITRITE NEGATIVE 11/26/2021 0921   LEUKOCYTESUR NEGATIVE 11/26/2021 0921   Sepsis Labs: '@LABRCNTIP' (procalcitonin:4,lacticidven:4) )No results found for this or any previous visit (from the past 240 hour(s)).   Radiological Exams on Admission: No results found.    Assessment/Plan Principal Problem:   Acute on chronic pancreatitis (HCC) Active Problems:   Chronic atrial fibrillation (HCC)   Chronic combined systolic and diastolic CHF (congestive heart failure) (HCC)   HTN (hypertension), benign   Thrombocytopenia (HCC)    HLD (hyperlipidemia)   Diabetes mellitus without complication (HCC)   Assessment and Plan: * Acute on chronic pancreatitis (HCC) Lipase 181. Patient initially patient had severe left upper quadrant abdominal pain, which has improved with IV Dilaudid and Percocet treatment.  -will admit to tele bed as inpt. -As needed Dilaudid and Percocet for pain -IV fluid: Patient received 1 L normal saline, will continue 50 cc/h (patient has EF of 20-25%, limiting aggressive IV fluid treatment) -As needed Zofran -N.p.o. -recheck lipase   Chronic atrial fibrillation ([place]) Patient is not taking anticoagulants.  Heart rate 83 -Continue digoxin, Coreg  Diabetes mellitus without complication (HCC) Recent A1c 9.1, poorly controlled.  Patient is is taking NovoLog and Lantus 40 units daily -Sliding scale insulin -Glargine insulin 30 units daily  HLD (hyperlipidemia) - Patient's not taking Zocor currently -Follow-up with PCP  Thrombocytopenia ([place]) This is chronic issue.  Platelet 148. -Follow-up with CBC  HTN (hypertension), benign - IV hydralazine as needed -Coreg, Lotensin,  Chronic combined systolic and diastolic CHF (congestive heart failure) (HCC) 2D echo on 07/18/2021 showed EF of 20-25%.  Patient has trace leg edema, BNP 198.  Does not seem to have CHF exacerbation. -Hold Lasix due to pancreatitis -Watch volume status closely          DVT ppx: SQ Lovenox  Code Status: Full code  Family Communication:  Yes, patient's daughter and son   at bed side.      Disposition Plan:  Anticipate discharge back to previous environment  Consults called:  none  Admission status and Level of care: Telemetry Medical:   as inpt       Dispo: The patient is from: Home              Anticipated d/c is to: Home              Anticipated d/c date is: 2 days              Patient currently is not medically stable to d/c.    Severity of Illness:  The appropriate patient status for this  patient is INPATIENT. Inpatient status is judged to be reasonable and necessary in order to provide the required intensity of service to ensure the patient's safety. The patient's presenting symptoms, physical exam findings, and initial radiographic and laboratory data in the context of their chronic comorbidities is felt to place them at high risk for further clinical deterioration. Furthermore, it is not anticipated that the patient will be medically stable for discharge from the hospital within 2 midnights of admission.   * I certify that at the point of admission it is my clinical judgment that the patient will require inpatient hospital care spanning beyond 2 midnights from the point of admission due to high intensity of service, high risk for further deterioration and high frequency of surveillance required.*       Date of Service 11/26/2021    Ivor Costa [facility]   If 7PM-7AM, please contact night-coverage [email or web address] 11/26/2021, 6:38 PM

## 2022-12-27 ENCOUNTER — Emergency Department
Admission: EM | Admit: 2022-12-27 | Discharge: 2022-12-28 | Disposition: A | Discharge status: Home / Self Care | Payer: No Typology Code available for payment source | Attending: Emergency Medicine | Admitting: Emergency Medicine

## 2022-12-27 ENCOUNTER — Emergency Department: Payer: No Typology Code available for payment source

## 2022-12-27 ENCOUNTER — Other Ambulatory Visit: Payer: Self-pay

## 2022-12-27 ENCOUNTER — Encounter: Payer: Self-pay | Admitting: Intensive Care

## 2022-12-27 DIAGNOSIS — I4891 Unspecified atrial fibrillation: Secondary | ICD-10-CM | POA: Diagnosis not present

## 2022-12-27 DIAGNOSIS — K859 Acute pancreatitis without necrosis or infection, unspecified: Secondary | ICD-10-CM | POA: Insufficient documentation

## 2022-12-27 DIAGNOSIS — K861 Other chronic pancreatitis: Secondary | ICD-10-CM | POA: Diagnosis not present

## 2022-12-27 DIAGNOSIS — E119 Type 2 diabetes mellitus without complications: Secondary | ICD-10-CM | POA: Insufficient documentation

## 2022-12-27 LAB — LIPASE, BLOOD: Lipase: 140 U/L — ABNORMAL HIGH (ref 11–51)

## 2022-12-27 LAB — COMPREHENSIVE METABOLIC PANEL
ALT: 21 U/L (ref 0–44)
AST: 21 U/L (ref 15–41)
Albumin: 3.4 g/dL — ABNORMAL LOW (ref 3.5–5.0)
Alkaline Phosphatase: 62 U/L (ref 38–126)
Anion gap: 12 (ref 5–15)
BUN: 13 mg/dL (ref 8–23)
CO2: 23 mmol/L (ref 22–32)
Calcium: 8.6 mg/dL — ABNORMAL LOW (ref 8.9–10.3)
Chloride: 101 mmol/L (ref 98–111)
Creatinine, Ser: 0.7 mg/dL (ref 0.61–1.24)
GFR, Estimated: >60 mL/min (ref >60)
Glucose, Bld: 277 mg/dL — ABNORMAL HIGH (ref 70–99)
Potassium: 4.7 mmol/L (ref 3.5–5.1)
Sodium: 136 mmol/L (ref 135–145)
Total Bilirubin: 1 mg/dL (ref 0.3–1.2)
Total Protein: 8.8 g/dL — ABNORMAL HIGH (ref 6.5–8.1)

## 2022-12-27 LAB — CBC
HCT: 43.2 % (ref 39.0–52.0)
Hemoglobin: 13.2 g/dL (ref 13.0–17.0)
MCH: 27.4 pg (ref 26.0–34.0)
MCHC: 30.6 g/dL (ref 30.0–36.0)
MCV: 89.6 fL (ref 80.0–100.0)
Platelets: 144 10*3/uL — ABNORMAL LOW (ref 150–400)
RBC: 4.82 MIL/uL (ref 4.22–5.81)
RDW: 13.6 % (ref 11.5–15.5)
WBC: 7.8 10*3/uL (ref 4.0–10.5)
nRBC: 0 % (ref 0.0–0.2)

## 2022-12-27 MED ORDER — ONDANSETRON 4 MG PO TBDP: 4.0000 mg | ORAL_TABLET | Freq: Once | ORAL | Status: AC

## 2022-12-27 MED ORDER — HYDROMORPHONE HCL 1 MG/ML IJ SOLN
0.5000 mg | INTRAMUSCULAR | Status: DC | PRN
  Administered 2022-12-27 (×2): 0.5 mg via INTRAVENOUS
  Filled 2022-12-27 (×2): qty 0.5

## 2022-12-27 MED ORDER — HYDROMORPHONE HCL 1 MG/ML IJ SOLN
0.5000 mg | INTRAMUSCULAR | Status: AC
  Administered 2022-12-27: 0.5 mg via INTRAVENOUS
  Filled 2022-12-27: qty 0.5

## 2022-12-27 MED ORDER — ONDANSETRON 4 MG PO TBDP
ORAL_TABLET | ORAL | Status: AC
  Administered 2022-12-27: 4 mg via ORAL
  Filled 2022-12-27: qty 1

## 2022-12-27 NOTE — ED Provider Notes (Signed)
[facility] Provider Note    Event Date/Time   First MD Initiated Contact with Patient 12/27/22 1959     (approximate)   History   Abdominal Pain   HPI  Melquan Ernsberger Whiters is a 67 y.o. male history of type 2 diabetes chronic pancreatitis, gallstone pancreatitis pancreatic cyst A-fib, cardiomyopathy chronic kidney disease   Patient reports he is having a flare of his "chronic pancreatitis".  Reports a longstanding history of pancreatitis.  He has had multiple episodes in the past.  He takes prescription hydromorphone tablets at home.  He recommends this morning after eating breakfast that he was having pain in his upper mid abdomen that he associates with pancreatitis.  He took hydromorphone it helped alleviate his pain and then he went to eat lunch today and it occurred again but more severe requiring him to take an additional 3 doses throughout the day of his pain medicine.  Reports ongoing pain in the upper abdomen  He advises sometimes he will get medication at the ER the pain will subside and he will be able to go home.  Sometimes he advises it will not go away and he will oftentimes need to be admitted for short-term treatment  He had nausea and vomiting earlier but now is left with primarily pain without ongoing nausea or vomiting.  No diarrhea.  No chest pain no shortness of breath.  Physical Exam   Triage Vital Signs: ED Triage Vitals  Encounter Vitals Group     BP 12/27/22 1848 (!) 153/79     Systolic BP Percentile --      Diastolic BP Percentile --      Pulse Rate 12/27/22 1848 (!) 101     Resp 12/27/22 1848 20     Temp 12/27/22 1848 98.4 F (36.9 C)     Temp Source 12/27/22 1848 Oral     SpO2 12/27/22 1848 97 %     Weight 12/27/22 1847 280 lb (127 kg)     Height 12/27/22 1847 6' (1.829 m)     Head Circumference --      Peak Flow --      Pain Score 12/27/22 1847 8     Pain Loc --      Pain Education --      Exclude from Growth Chart --      Most recent vital signs: Vitals:   12/27/22 1848  BP: (!) 153/79  Pulse: (!) 101  Resp: 20  Temp: 98.4 F (36.9 C)  SpO2: 97%     General: Awake, no distress.  Does however appear in some pain holding his hand over his epigastrium slight wincing. CV:  Good peripheral perfusion.  Resp:  Normal effort.  Clear bilateral Abd:  No distention.  Moderate tenderness in epigastrium.  No rebound or guarding.  No peritonitis.  No bruising of the flanks.  Lower abdomen soft nontender nondistended Other:     ED Results / Procedures / Treatments   Labs (all labs ordered are listed, but only abnormal results are displayed) Labs Reviewed  LIPASE, BLOOD - Abnormal; Notable for the following components:      Result Value   Lipase 140 (*)    All other components within normal limits  COMPREHENSIVE METABOLIC PANEL - Abnormal; Notable for the following components:   Glucose, Bld 277 (*)    Calcium 8.6 (*)    Total Protein 8.8 (*)    Albumin 3.4 (*)    All other  components within normal limits  URINALYSIS, ROUTINE W REFLEX MICROSCOPIC  CBC     EKG  Interpreted by me at 1850 heart rate 90 QRS 90 QTc 430 Atrial fibrillation, rate controlled.  Mild nonspecific T wave abnormality noted V2.  Patient does not have any associated chest or pulmonary symptoms.  He does have a history of chronic atrial fibrillation   RADIOLOGY  CT ABDOMEN PELVIS WO CONTRAST  Result Date: 12/27/2022 CLINICAL DATA:  Epigastric pain, query recurrent pancreatitis. Nausea and vomiting. Patient declined IV contrast. EXAM: CT ABDOMEN AND PELVIS WITHOUT CONTRAST TECHNIQUE: Multidetector CT imaging of the abdomen and pelvis was performed following the standard protocol without IV contrast. RADIATION DOSE REDUCTION: This exam was performed according to the departmental dose-optimization program which includes automated exposure control, adjustment of the mA and/or kV according to patient size and/or use of iterative  reconstruction technique. COMPARISON:  CT abdomen and pelvis 12/26/2021 FINDINGS: Lower chest: Bibasilar atelectasis.  No acute abnormality. Hepatobiliary: Hepatic cysts. Gallbladder is unremarkable. No biliary dilation. Pancreas: Pancreatic calcifications, pancreatic ductal dilation, pancreatic atrophy is redemonstrated. Scattered cystic pancreatic lesions are again seen. The largest is slightly increased in size measuring 2.1 cm, previously 1.6 cm in the posterior body of the pancreas on series 2/image 41. No peripancreatic stranding to suggest acute inflammation. Spleen: Unremarkable. Adrenals/Urinary Tract: Stable adrenal glands and kidneys. No urinary calculi or hydronephrosis. Unremarkable bladder. Stomach/Bowel: Normal caliber large and small bowel. Normal appendix. Stomach is within normal limits. Vascular/Lymphatic: Aortic atherosclerosis. No enlarged abdominal or pelvic lymph nodes. Reproductive: Prostatomegaly. Other: No free intraperitoneal fluid or air. Similar laxity of the ventral abdominal wall. Musculoskeletal: No acute fracture. IMPRESSION: 1. Findings of chronic pancreatitis. No evidence of acute pancreatitis. 2. Scattered cystic pancreatic lesions are again seen. The largest is slightly increased in size measuring 2.1 cm, previously 1.6 cm in the posterior body of the pancreas. These may represent side branch IPMN or pseudocysts. Consider follow-up pancreatic protocol MRI/MRCP with and without contrast in 12 months. 3. Prostatomegaly. Aortic Atherosclerosis (ICD10-I70.0). Electronically Signed   By: Minerva Fester M.D.   On: 12/27/2022 21:59      PROCEDURES:  Critical Care performed: No  Procedures   MEDICATIONS ORDERED IN ED: Medications  ondansetron (ZOFRAN-ODT) disintegrating tablet 4 mg (4 mg Oral Given 12/27/22 1950)     IMPRESSION / MDM / ASSESSMENT AND PLAN / ED COURSE  I reviewed the triage vital signs and the nursing notes.                              Differential  diagnosis includes, but is not limited to, recurrent pancreatitis, gastritis, choledocholithiasis, peptic ulcer disease, diverticulitis, etc.  The patient does however report a longstanding history of same symptoms and presents with what he classically describes as pancreatitis symptoms.  I suspect this is likely mild flare of his acute on chronic pancreatitis with his minimally elevated lipase but is chronic history.  Additionally CT imaging does not show evidence of an acute change or evidence of obvious acute severe pancreatitis.  CBC is normal he is afebrile    Patient's presentation is most consistent with acute complicated illness / injury requiring diagnostic workup.   The patient is on the cardiac monitor to evaluate for evidence of arrhythmia and/or significant heart rate changes.  Noted while resting after the second dose of hydromorphone to have mild hypoxia while asleep, this has resolved though with patient now alert and  oriented as of 10:30 PM.  Discussed with the patient his goal, and he reports he is still having some discomfort but is easing off.  He would like to wait an additional little bit of time, perhaps 15 minutes discussed with family and decide if he might want to go home and continue care for presumed chronic pancreatitis.  Evaluating patient approximately 1030PM, he is fully awake and alert, he advises abdominal pain has continued pain about 7.  He is discussed with his family, and he would like 1 more additional dose of pain medication prior to deciding if he would want to be hospitalized for continued care at home.  He does advise he has oral Dilaudid at home.  Discussed fluids, I recommended to him to consideration for admission due to his ongoing pain from pancreatitis and persistence of discomfort, but patient is fairly adamant that his goal of care is to return to his home.  He would like 1 additional dose of hydromorphone and reassessment.  He is awake alert oriented.  He  does have a mild oxygen requirement, but reports use of home oxygen in the evening.  He is in no distress  Will give additional dose of hydromorphone, for which she has received 2 previous doses.  If pain improved alert oriented feeling improved likely discharge to home to continue with his outpatient medication regimen which she will be next due for around breakfast time.  Ongoing care assigned to Dr. Elesa Massed at approximately 11 PM, follow-up on reassessment patient, pain control, mental status and if patient would desire admission versus outpatient treatment.     FINAL CLINICAL IMPRESSION(S) / ED DIAGNOSES   Chronic Pancreatitis   Rx / DC Orders   ED Discharge Orders     None        Note:  This document was prepared using Dragon voice recognition software and may include unintentional dictation errors.   Sharyn Creamer, MD 12/28/22 [phone]

## 2022-12-27 NOTE — ED Triage Notes (Addendum)
Pt to ED via [facility] from home. Pt reports abdominal pain. Pt with hx pancreatitis. Pt has taken 3 dilaudid without relief. Pt reports N/V. Pt has oxygen PRN. EMS reports pt anxious and placed on 2L [place] for comfort.  EMS VS:  BP 180/90 HR 86 243 CBG  98% [place] 2L

## 2022-12-27 NOTE — ED Provider Notes (Incomplete)
11:30 PM  Assumed care at shift change.  Patient with chronic pancreatitis.  Getting 3rd round of IV pain meds to help pain control.  Will reassess for disposition.  12:06 AM  Pt's pain has improved but he is worried about going home because pain is quickly returning requiring multiple rounds of IV medication.  He would like to try some thing to eat prior to making a decision on disposition.  Will p.o. challenge.   12:50 AM  Pt able to eat here without return of pain.  He states he has pain medication for home.  Have offered admission but patient states he is feeling better would like discharge home.  He has an appointment to see his primary care provider at the [facility] this morning at 8:40 AM.  Will provide him with a copy of his records from today.  At this time, I do not feel there is any life-threatening condition present. I reviewed all nursing notes, vitals, pertinent previous records.  All lab and urine results, EKGs, imaging ordered have been independently reviewed and interpreted by myself.  I reviewed all available radiology reports from any imaging ordered this visit.  Based on my assessment, I feel the patient is safe to be discharged home without further emergent workup and can continue workup as an outpatient as needed. Discussed all findings, treatment plan as well as usual and customary return precautions.  They verbalize understanding and are comfortable with this plan.  Outpatient follow-up has been provided as needed.  All questions have been answered.    Alontae Chaloux, Layla Maw, DO 12/28/22 [phone]

## 2022-12-27 NOTE — ED Notes (Signed)
Pt transported to CT ?

## 2022-12-27 NOTE — ED Notes (Signed)
This RN unsuccessful at IV

## 2022-12-28 ENCOUNTER — Inpatient Hospital Stay (HOSPITAL_COMMUNITY)
Admission: EM | Admit: 2022-12-28 | Discharge: 2022-12-29 | DRG: 917 | Disposition: A | Discharge status: Home Health | Payer: No Typology Code available for payment source | Attending: Internal Medicine | Admitting: Internal Medicine

## 2022-12-28 ENCOUNTER — Inpatient Hospital Stay (HOSPITAL_COMMUNITY): Payer: No Typology Code available for payment source

## 2022-12-28 ENCOUNTER — Encounter (HOSPITAL_COMMUNITY): Payer: Self-pay

## 2022-12-28 ENCOUNTER — Emergency Department (HOSPITAL_COMMUNITY): Payer: No Typology Code available for payment source

## 2022-12-28 DIAGNOSIS — W19XXXA Unspecified fall, initial encounter: Secondary | ICD-10-CM | POA: Diagnosis present

## 2022-12-28 DIAGNOSIS — Z9842 Cataract extraction status, left eye: Secondary | ICD-10-CM

## 2022-12-28 DIAGNOSIS — Z9981 Dependence on supplemental oxygen: Secondary | ICD-10-CM

## 2022-12-28 DIAGNOSIS — G8929 Other chronic pain: Secondary | ICD-10-CM | POA: Diagnosis present

## 2022-12-28 DIAGNOSIS — Z794 Long term (current) use of insulin: Secondary | ICD-10-CM

## 2022-12-28 DIAGNOSIS — T402X4A Poisoning by other opioids, undetermined, initial encounter: Secondary | ICD-10-CM | POA: Diagnosis not present

## 2022-12-28 DIAGNOSIS — R4781 Slurred speech: Secondary | ICD-10-CM | POA: Diagnosis present

## 2022-12-28 DIAGNOSIS — J9621 Acute and chronic respiratory failure with hypoxia: Secondary | ICD-10-CM | POA: Diagnosis present

## 2022-12-28 DIAGNOSIS — J9611 Chronic respiratory failure with hypoxia: Secondary | ICD-10-CM

## 2022-12-28 DIAGNOSIS — Z885 Allergy status to narcotic agent status: Secondary | ICD-10-CM

## 2022-12-28 DIAGNOSIS — I482 Chronic atrial fibrillation, unspecified: Secondary | ICD-10-CM | POA: Diagnosis present

## 2022-12-28 DIAGNOSIS — Z961 Presence of intraocular lens: Secondary | ICD-10-CM | POA: Diagnosis present

## 2022-12-28 DIAGNOSIS — Z9841 Cataract extraction status, right eye: Secondary | ICD-10-CM

## 2022-12-28 DIAGNOSIS — I130 Hypertensive heart and chronic kidney disease with heart failure and stage 1 through stage 4 chronic kidney disease, or unspecified chronic kidney disease: Secondary | ICD-10-CM | POA: Diagnosis present

## 2022-12-28 DIAGNOSIS — K861 Other chronic pancreatitis: Secondary | ICD-10-CM | POA: Diagnosis present

## 2022-12-28 DIAGNOSIS — I429 Cardiomyopathy, unspecified: Secondary | ICD-10-CM | POA: Diagnosis present

## 2022-12-28 DIAGNOSIS — I341 Nonrheumatic mitral (valve) prolapse: Secondary | ICD-10-CM | POA: Diagnosis present

## 2022-12-28 DIAGNOSIS — I5022 Chronic systolic (congestive) heart failure: Secondary | ICD-10-CM | POA: Diagnosis present

## 2022-12-28 DIAGNOSIS — D649 Anemia, unspecified: Secondary | ICD-10-CM | POA: Diagnosis present

## 2022-12-28 DIAGNOSIS — Y92009 Unspecified place in unspecified non-institutional (private) residence as the place of occurrence of the external cause: Secondary | ICD-10-CM | POA: Diagnosis not present

## 2022-12-28 DIAGNOSIS — R531 Weakness: Secondary | ICD-10-CM

## 2022-12-28 DIAGNOSIS — R519 Headache, unspecified: Principal | ICD-10-CM

## 2022-12-28 DIAGNOSIS — Z6841 Body Mass Index (BMI) 40.0 and over, adult: Secondary | ICD-10-CM

## 2022-12-28 DIAGNOSIS — Z8673 Personal history of transient ischemic attack (TIA), and cerebral infarction without residual deficits: Secondary | ICD-10-CM

## 2022-12-28 DIAGNOSIS — J9622 Acute and chronic respiratory failure with hypercapnia: Secondary | ICD-10-CM | POA: Diagnosis present

## 2022-12-28 DIAGNOSIS — Z8249 Family history of ischemic heart disease and other diseases of the circulatory system: Secondary | ICD-10-CM

## 2022-12-28 DIAGNOSIS — Z7982 Long term (current) use of aspirin: Secondary | ICD-10-CM

## 2022-12-28 DIAGNOSIS — D696 Thrombocytopenia, unspecified: Secondary | ICD-10-CM | POA: Diagnosis present

## 2022-12-28 DIAGNOSIS — T402X1A Poisoning by other opioids, accidental (unintentional), initial encounter: Secondary | ICD-10-CM | POA: Diagnosis present

## 2022-12-28 DIAGNOSIS — Z5320 Procedure and treatment not carried out because of patient's decision for unspecified reasons: Secondary | ICD-10-CM | POA: Diagnosis present

## 2022-12-28 DIAGNOSIS — E1140 Type 2 diabetes mellitus with diabetic neuropathy, unspecified: Secondary | ICD-10-CM | POA: Diagnosis present

## 2022-12-28 DIAGNOSIS — Z79891 Long term (current) use of opiate analgesic: Secondary | ICD-10-CM

## 2022-12-28 DIAGNOSIS — Z79899 Other long term (current) drug therapy: Secondary | ICD-10-CM | POA: Diagnosis not present

## 2022-12-28 DIAGNOSIS — E1122 Type 2 diabetes mellitus with diabetic chronic kidney disease: Secondary | ICD-10-CM | POA: Diagnosis present

## 2022-12-28 DIAGNOSIS — Z833 Family history of diabetes mellitus: Secondary | ICD-10-CM

## 2022-12-28 DIAGNOSIS — R0602 Shortness of breath: Secondary | ICD-10-CM | POA: Diagnosis not present

## 2022-12-28 DIAGNOSIS — G4733 Obstructive sleep apnea (adult) (pediatric): Secondary | ICD-10-CM | POA: Diagnosis present

## 2022-12-28 DIAGNOSIS — K859 Acute pancreatitis without necrosis or infection, unspecified: Secondary | ICD-10-CM | POA: Diagnosis present

## 2022-12-28 DIAGNOSIS — E785 Hyperlipidemia, unspecified: Secondary | ICD-10-CM | POA: Diagnosis present

## 2022-12-28 DIAGNOSIS — K862 Cyst of pancreas: Secondary | ICD-10-CM | POA: Diagnosis present

## 2022-12-28 DIAGNOSIS — M5030 Other cervical disc degeneration, unspecified cervical region: Secondary | ICD-10-CM | POA: Diagnosis present

## 2022-12-28 DIAGNOSIS — R29810 Facial weakness: Secondary | ICD-10-CM | POA: Diagnosis present

## 2022-12-28 DIAGNOSIS — Z807 Family history of other malignant neoplasms of lymphoid, hematopoietic and related tissues: Secondary | ICD-10-CM

## 2022-12-28 LAB — I-STAT CHEM 8, ED
BUN: 22 mg/dL (ref 8–23)
Calcium, Ion: 1.07 mmol/L — ABNORMAL LOW (ref 1.15–1.40)
Chloride: 101 mmol/L (ref 98–111)
Creatinine, Ser: 1 mg/dL (ref 0.61–1.24)
Glucose, Bld: 383 mg/dL — ABNORMAL HIGH (ref 70–99)
HCT: 44 % (ref 39.0–52.0)
Hemoglobin: 15 g/dL (ref 13.0–17.0)
Potassium: 5.2 mmol/L — ABNORMAL HIGH (ref 3.5–5.1)
Sodium: 139 mmol/L (ref 135–145)
TCO2: 29 mmol/L (ref 22–32)

## 2022-12-28 LAB — COMPREHENSIVE METABOLIC PANEL
ALT: 19 U/L (ref 0–44)
AST: 17 U/L (ref 15–41)
Albumin: 3.1 g/dL — ABNORMAL LOW (ref 3.5–5.0)
Alkaline Phosphatase: 58 U/L (ref 38–126)
Anion gap: 5 (ref 5–15)
BUN: 15 mg/dL (ref 8–23)
CO2: 31 mmol/L (ref 22–32)
Calcium: 8.5 mg/dL — ABNORMAL LOW (ref 8.9–10.3)
Chloride: 100 mmol/L (ref 98–111)
Creatinine, Ser: 1.17 mg/dL (ref 0.61–1.24)
GFR, Estimated: >60 mL/min (ref >60)
Glucose, Bld: 369 mg/dL — ABNORMAL HIGH (ref 70–99)
Potassium: 4.4 mmol/L (ref 3.5–5.1)
Sodium: 136 mmol/L (ref 135–145)
Total Bilirubin: 1 mg/dL (ref 0.3–1.2)
Total Protein: 7.8 g/dL (ref 6.5–8.1)

## 2022-12-28 LAB — LIPID PANEL
Cholesterol: 113 mg/dL (ref 0–200)
HDL: 38 mg/dL — ABNORMAL LOW (ref >40)
LDL Cholesterol: 59 mg/dL (ref 0–99)
Total CHOL/HDL Ratio: 3 {ratio}
Triglycerides: 78 mg/dL (ref <150)
VLDL: 16 mg/dL (ref 0–40)

## 2022-12-28 LAB — I-STAT VENOUS BLOOD GAS, ED
Acid-Base Excess: 4 mmol/L — ABNORMAL HIGH (ref 0.0–2.0)
Bicarbonate: 32 mmol/L — ABNORMAL HIGH (ref 20.0–28.0)
Calcium, Ion: 1.11 mmol/L — ABNORMAL LOW (ref 1.15–1.40)
HCT: 42 % (ref 39.0–52.0)
Hemoglobin: 14.3 g/dL (ref 13.0–17.0)
O2 Saturation: 84 %
Potassium: 4.2 mmol/L (ref 3.5–5.1)
Sodium: 141 mmol/L (ref 135–145)
TCO2: 34 mmol/L — ABNORMAL HIGH (ref 22–32)
pCO2, Ven: 60.3 mm[Hg] — ABNORMAL HIGH (ref 44–60)
pH, Ven: 7.334 (ref 7.25–7.43)
pO2, Ven: 54 mm[Hg] — ABNORMAL HIGH (ref 32–45)

## 2022-12-28 LAB — IRON AND TIBC
Iron: 48 ug/dL (ref 45–182)
Saturation Ratios: 14 % — ABNORMAL LOW (ref 17.9–39.5)
TIBC: 353 ug/dL (ref 250–450)
UIBC: 305 ug/dL

## 2022-12-28 LAB — DIFFERENTIAL
Abs Immature Granulocytes: 0.03 10*3/uL (ref 0.00–0.07)
Basophils Absolute: 0 10*3/uL (ref 0.0–0.1)
Basophils Relative: 0 %
Eosinophils Absolute: 0 10*3/uL (ref 0.0–0.5)
Eosinophils Relative: 0 %
Immature Granulocytes: 0 %
Lymphocytes Relative: 5 %
Lymphs Abs: 0.4 10*3/uL — ABNORMAL LOW (ref 0.7–4.0)
Monocytes Absolute: 0.3 10*3/uL (ref 0.1–1.0)
Monocytes Relative: 4 %
Neutro Abs: 6.7 10*3/uL (ref 1.7–7.7)
Neutrophils Relative %: 91 %

## 2022-12-28 LAB — CBC
HCT: 42.7 % (ref 39.0–52.0)
Hemoglobin: 12.6 g/dL — ABNORMAL LOW (ref 13.0–17.0)
MCH: 26.6 pg (ref 26.0–34.0)
MCHC: 29.5 g/dL — ABNORMAL LOW (ref 30.0–36.0)
MCV: 90.3 fL (ref 80.0–100.0)
Platelets: 149 10*3/uL — ABNORMAL LOW (ref 150–400)
RBC: 4.73 MIL/uL (ref 4.22–5.81)
RDW: 14 % (ref 11.5–15.5)
WBC: 7.4 10*3/uL (ref 4.0–10.5)
nRBC: 0 % (ref 0.0–0.2)

## 2022-12-28 LAB — HEMOGLOBIN A1C
Hgb A1c MFr Bld: 10.3 % — ABNORMAL HIGH (ref 4.8–5.6)
Mean Plasma Glucose: 248.91 mg/dL

## 2022-12-28 LAB — GLUCOSE, CAPILLARY
Glucose-Capillary: 181 mg/dL — ABNORMAL HIGH (ref 70–99)
Glucose-Capillary: 295 mg/dL — ABNORMAL HIGH (ref 70–99)

## 2022-12-28 LAB — HIV ANTIBODY (ROUTINE TESTING W REFLEX): HIV Screen 4th Generation wRfx: NONREACTIVE

## 2022-12-28 LAB — PROTIME-INR
INR: 1.1 (ref 0.8–1.2)
Prothrombin Time: 14.8 s (ref 11.4–15.2)

## 2022-12-28 LAB — BRAIN NATRIURETIC PEPTIDE: B Natriuretic Peptide: 235.6 pg/mL — ABNORMAL HIGH (ref 0.0–100.0)

## 2022-12-28 LAB — CBG MONITORING, ED
Glucose-Capillary: 336 mg/dL — ABNORMAL HIGH (ref 70–99)
Glucose-Capillary: 278 mg/dL — ABNORMAL HIGH (ref 70–99)

## 2022-12-28 LAB — APTT: aPTT: 26 s (ref 24–36)

## 2022-12-28 LAB — ETHANOL: Alcohol, Ethyl (B): <10 mg/dL (ref <10)

## 2022-12-28 LAB — DIGOXIN LEVEL: Digoxin Level: 0.5 ng/mL — ABNORMAL LOW (ref 0.8–2.0)

## 2022-12-28 LAB — FERRITIN: Ferritin: 142 ng/mL (ref 24–336)

## 2022-12-28 MED ORDER — INSULIN ASPART 100 UNIT/ML IJ SOLN
0.0000 [IU] | Freq: Every day | INTRAMUSCULAR | Status: DC
  Administered 2022-12-28: 3 [IU] via SUBCUTANEOUS

## 2022-12-28 MED ORDER — KETOROLAC TROMETHAMINE 15 MG/ML IJ SOLN
10.0000 mg | Freq: Once | INTRAMUSCULAR | Status: AC
  Administered 2022-12-28: 10 mg via INTRAVENOUS
  Filled 2022-12-28: qty 1

## 2022-12-28 MED ORDER — ENOXAPARIN SODIUM 40 MG/0.4ML IJ SOSY
40.0000 mg | PREFILLED_SYRINGE | INTRAMUSCULAR | Status: DC
  Filled 2022-12-28: qty 0.4

## 2022-12-28 MED ORDER — CARVEDILOL 3.125 MG PO TABS
3.1250 mg | ORAL_TABLET | Freq: Two times a day (BID) | ORAL | Status: DC
  Administered 2022-12-28 – 2022-12-29 (×2): 3.125 mg via ORAL
  Filled 2022-12-28 (×2): qty 1

## 2022-12-28 MED ORDER — INSULIN ASPART 100 UNIT/ML IJ SOLN
0.0000 [IU] | Freq: Three times a day (TID) | INTRAMUSCULAR | Status: DC
  Administered 2022-12-28: 4 [IU] via SUBCUTANEOUS
  Administered 2022-12-28: 11 [IU] via SUBCUTANEOUS
  Administered 2022-12-29: 7 [IU] via SUBCUTANEOUS
  Administered 2022-12-29: 4 [IU] via SUBCUTANEOUS

## 2022-12-28 MED ORDER — SODIUM CHLORIDE 0.9% FLUSH
3.0000 mL | Freq: Once | INTRAVENOUS | Status: AC
  Administered 2022-12-28: 3 mL via INTRAVENOUS

## 2022-12-28 MED ORDER — PROCHLORPERAZINE EDISYLATE 10 MG/2ML IJ SOLN
10.0000 mg | Freq: Once | INTRAMUSCULAR | Status: AC
  Administered 2022-12-28: 10 mg via INTRAVENOUS
  Filled 2022-12-28: qty 2

## 2022-12-28 MED ORDER — FUROSEMIDE 10 MG/ML IJ SOLN
40.0000 mg | Freq: Once | INTRAMUSCULAR | Status: AC
  Administered 2022-12-28: 40 mg via INTRAVENOUS
  Filled 2022-12-28: qty 4

## 2022-12-28 MED ORDER — FUROSEMIDE 10 MG/ML IJ SOLN
40.0000 mg | Freq: Once | INTRAMUSCULAR | Status: DC
  Filled 2022-12-28: qty 4

## 2022-12-28 MED ORDER — ACETAMINOPHEN 325 MG PO TABS
650.0000 mg | ORAL_TABLET | Freq: Four times a day (QID) | ORAL | Status: DC | PRN
  Administered 2022-12-29 (×2): 650 mg via ORAL
  Filled 2022-12-28 (×3): qty 2

## 2022-12-28 MED ORDER — DIGOXIN 0.25 MG/ML IJ SOLN
0.1250 mg | Freq: Every day | INTRAMUSCULAR | Status: DC
  Administered 2022-12-28 – 2022-12-29 (×2): 0.125 mg via INTRAVENOUS
  Filled 2022-12-28: qty 0.5
  Filled 2022-12-28: qty 2

## 2022-12-28 MED ORDER — ACETAMINOPHEN 650 MG RE SUPP: 650.0000 mg | Freq: Four times a day (QID) | RECTAL | Status: DC | PRN

## 2022-12-28 MED ORDER — INSULIN GLARGINE-YFGN 100 UNIT/ML ~~LOC~~ SOLN
20.0000 [IU] | Freq: Every day | SUBCUTANEOUS | Status: DC
  Administered 2022-12-28: 20 [IU] via SUBCUTANEOUS
  Filled 2022-12-28 (×2): qty 0.2

## 2022-12-28 MED ORDER — METOCLOPRAMIDE HCL 5 MG/ML IJ SOLN
10.0000 mg | Freq: Once | INTRAMUSCULAR | Status: AC
  Administered 2022-12-28: 10 mg via INTRAVENOUS
  Filled 2022-12-28: qty 2

## 2022-12-28 MED ORDER — DIGOXIN 125 MCG PO TABS: 0.1250 mg | ORAL_TABLET | Freq: Every day | ORAL | Status: DC

## 2022-12-28 MED ORDER — OXYCODONE HCL 5 MG PO TABS
5.0000 mg | ORAL_TABLET | ORAL | Status: DC | PRN
  Administered 2022-12-28 – 2022-12-29 (×4): 5 mg via ORAL
  Filled 2022-12-28 (×5): qty 1

## 2022-12-28 NOTE — Progress Notes (Signed)
VASCULAR LAB    Bilateral lower extremity venous duplex has been performed.  See CV proc for preliminary results.   Edras Wilford, RVT 12/28/2022, 12:41 PM

## 2022-12-28 NOTE — Evaluation (Signed)
Occupational Therapy Evaluation Patient Details Name: Don Brown MRN: [id number] DOB: 1955-08-26 Today's Date: 12/28/2022   History of Present Illness 67 y.o. M with PMH type 2 diabetes, chronic pancreatitis with opioid use, atrial fibrillation not on anticoagulation, adjustment disorder. HFrEF LV EF 20-25%, chronic hypoxic respiratory failure on 2-4L [place], HTN, HLD, who was BIB EMS after family found him stuporous and obtunded, followed by him falling, at home overnight.   Clinical Impression   Patient admitted for the diagnosis above.  PTA he lives with family in a single story home, and needs assist with in home mobility and lower body ADL per family present.  Patient very lethargic, with difficulty maintaining LOA during OT eval.  OT will continue efforts in the acute setting to determine functional status and help assist with transition to next level of care.         If plan is discharge home, recommend the following: Assist for transportation;Assistance with cooking/housework;A lot of help with walking and/or transfers;A lot of help with bathing/dressing/bathroom    Functional Status Assessment  Patient has had a recent decline in their functional status and demonstrates the ability to make significant improvements in function in a reasonable and predictable amount of time.  Equipment Recommendations  None recommended by OT    Recommendations for Other Services       Precautions / Restrictions Precautions Precautions: Fall Precaution Comments: Watch HR and O2 Restrictions Weight Bearing Restrictions: No      Mobility Bed Mobility Overal bed mobility: Needs Assistance Bed Mobility: Supine to Sit     Supine to sit: Mod assist       Patient Response: Cooperative  Transfers                   General transfer comment: NT due to falling asleep      Balance Overall balance assessment: Needs assistance Sitting-balance support: Feet unsupported, Single  extremity supported Sitting balance-Leahy Scale: Poor   Postural control: Posterior lean, Right lateral lean                                 ADL either performed or assessed with clinical judgement   ADL                                         General ADL Comments: Unable to fully assess due to lethargy     Vision Baseline Vision/History: 1 Wears glasses Patient Visual Report: No change from baseline       Perception Perception: Not tested       Praxis Praxis: Not tested       Pertinent Vitals/Pain Pain Assessment Pain Assessment: No/denies pain     Extremity/Trunk Assessment Upper Extremity Assessment Upper Extremity Assessment: Generalized weakness   Lower Extremity Assessment Lower Extremity Assessment: Defer to PT evaluation   Cervical / Trunk Assessment Cervical / Trunk Assessment: Other exceptions Cervical / Trunk Exceptions: Body habitus   Communication Communication Communication: Difficulty communicating thoughts/reduced clarity of speech   Cognition Arousal: Lethargic, Suspect due to medications Behavior During Therapy: Flat affect Overall Cognitive Status: Difficult to assess  General Comments   VSS on O2    Exercises     Shoulder Instructions      Home Living Family/patient expects to be discharged to:: Private residence Living Arrangements: Children Available Help at Discharge: Family;Available PRN/intermittently Type of Home: House Home Access: Stairs to enter [employer] of Steps: 1 Entrance Stairs-Rails: None Home Layout: One level     Bathroom Shower/Tub: Chief Strategy Officer: Standard Bathroom Accessibility: Yes How Accessible: Accessible via walker Home Equipment: Rolling Walker (2 wheels);Cane - single point          Prior Functioning/Environment Prior Level of Function : Needs assist             Mobility  Comments: SPC with assist from family ADLs Comments: Assist as needed from family for ADL.  Does not drive.  Limited ability to assist with iADL        OT Problem List: Decreased strength;Decreased cognition;Decreased safety awareness      OT Treatment/Interventions: Self-care/ADL training;Therapeutic activities;Balance training;DME and/or AE instruction    OT Goals(Current goals can be found in the care plan section) Acute Rehab OT Goals OT Goal Formulation: Patient unable to participate in goal setting Time For Goal Achievement: 01/11/23 Potential to Achieve Goals: Fair ADL Goals Pt Will Perform Grooming: with set-up;sitting Pt Will Perform Upper Body Dressing: with set-up;sitting Pt Will Perform Lower Body Dressing: with min assist;sitting/lateral leans;sit to/from stand Pt Will Transfer to Toilet: with min assist;stand pivot transfer;bedside commode  OT Frequency: Min 1X/week    Co-evaluation              AM-PAC OT "6 Clicks" Daily Activity     Outcome Measure Help from another person eating meals?: A Little Help from another person taking care of personal grooming?: A Little Help from another person toileting, which includes using toliet, bedpan, or urinal?: A Lot Help from another person bathing (including washing, rinsing, drying)?: A Lot Help from another person to put on and taking off regular upper body clothing?: A Lot Help from another person to put on and taking off regular lower body clothing?: A Lot 6 Click Score: 14   End of Session Nurse Communication: Mobility status  Activity Tolerance: Patient limited by lethargy Patient left: in bed;with family/visitor present  OT Visit Diagnosis: Unsteadiness on feet (R26.81);Other symptoms and signs involving cognitive function                Time: 1344-1400 OT Time Calculation (min): 16 min Charges:  OT General Charges $OT Visit: 1 Visit OT Evaluation $OT Eval Moderate Complexity: 1 Mod  12/28/2022  RP,  OTR/L  Acute Rehabilitation Services  Office:  [phone]   Suzanna Obey 12/28/2022, 2:12 PM

## 2022-12-28 NOTE — Inpatient Diabetes Management (Signed)
Inpatient Diabetes Program Recommendations  AACE/ADA: New Consensus Statement on Inpatient Glycemic Control  Target Ranges:  Prepandial:   less than 140 mg/dL      Peak postprandial:   less than 180 mg/dL (1-2 hours)      Critically ill patients:  140 - 180 mg/dL    Latest Reference Range & Units 12/28/22 07:10  Glucose-Capillary 70 - 99 mg/dL 161 (H)    Latest Reference Range & Units 12/28/22 07:17 12/28/22 08:00  Glucose 70 - 99 mg/dL 096 (H) 045 (H)   Review of Glycemic Control  Diabetes history: DM2 Outpatient Diabetes medications: Lantus 40 units daily, Novolog TID Current orders for Inpatient glycemic control: None; in ED  Inpatient Diabetes Program Recommendations:    Insulin: If patient is admitted, please consider ordering Lantus 30 units Q24H, CBGs AC&HS, and Novolog 0-15 units TID with meals and bedtime.  Thanks, Orlando Penner, RN, MSN, CDCES Diabetes Coordinator Inpatient Diabetes Program [phone] (Team Pager from 8am to 5pm)

## 2022-12-28 NOTE — ED Triage Notes (Signed)
Pt BIB [facility] after a fall. Upon EMS arrival, pt was weak in L arm and L leg and code stroke was called at that time. Pt was in A fib RVR rate 150 en route per EMS.

## 2022-12-28 NOTE — Progress Notes (Signed)
Paged by nurse as patient was complaining of pain. Saw patient at bedside, patient was very irritable and demanding oxycodone for a headache and his "body pain". Pt refused to elaborate on the nature of his symptoms, stating all the information should be in his chart. Returned 30 minutes later, at which time patient had received his scheduled oxycodone. Gave compazine for continued headache pain.   Monna Fam, MD

## 2022-12-28 NOTE — H&P (Cosign Needed Addendum)
Date: 12/28/2022               Patient Name:  Don Brown MRN: [id number]  DOB: 07-13-1955 Age / Sex: 67 y.o., male   PCP: Sherwood Gambler, MD         Medical Service: Internal Medicine Teaching Service         Attending Physician: Dr. [name], MD    First Contact: Dr. Peterson Ao Pager: [phone]  Second Contact: Dr. Rana Snare Pager: [phone]       After Hours (After 5p/  First Contact Pager: [phone]  weekends / holidays): Second Contact Pager: [phone]   Chief Complaint: weakness  History of Present Illness: Don Brown is a 67 y.o. M with PMH type 2 diabetes, chronic pancreatitis with opioid use, atrial fibrillation not on anticoagulation, adjustment disorder. HFrEF LV EF 20-25%, chronic hypoxic respiratory failure on 2-4L [place], HTN, HLD, who was BIB EMS after family found him stuporous and obtunded, followed by him falling, at home overnight.  History obtained from chart review. Patient was evaluated yesterday evening where he was treated for acute on chronic pancreatitis. He received several rounds of IV opioids prior to discharge. When EMS arrived on scene, patient was confused with pinpoint pupils and hypoxia. Patient was given narcan which resulted in improvement of clinical status. He had residual R sided weakness however so a code stroke was called. Neurology assessed patient and ultimately felt that an acute CVA was unlikely. Of note, he refused MRI brain and CTA head/neck citing that he does not like the MRI machine or contrast, and will not accept anxiolytics to assist with anxiety to get MRI. He also refused CTA chest for PE. A LE [place] for DVT study is pending.  When he does engage in conversation (minimally) he says that his head hurts but will not explain further.  His son does not believe his shortness of breath has been worse than baseline recently. He does not ambulate independently. He has an occasional cough. He has been able to tolerate PO  intake lately, does not comment on nausea/vomiting/diarrhea.  IMTS paged for admission.  M[facility]: [facility]; please assess further once patient more interactive. Amylase/lipase/protease 2 capsules TID with meals and 1 capsule with snacks PRN ASA 81 mg daily Carvedilol 3.125 mg BID Digoxin 125 mcg daily at 2P Furosemide 80 mg daily Gabapentin 600 mg BID Novolog SSI TID Glargine 40 units daily Oxycodone 10 mg 4 times daily PRN chronic pancreatitis  Allergies: Allergies as of 12/28/2022 - Reviewed 12/28/2022  Allergen Reaction Noted   Morphine and codeine Nausea And Vomiting 01/13/2011   Past Medical History: Type 2 diabetes mellitus Chronic pancreatitis Chronic opioid use Atrial fibrillation not on anticoagulation Adjustment disorder Cardiomyopathy Cervical disc disorder with radiculopathy CKD stage 3 Chronic respiratory failure on 2-4L [place] PRN HTN HLD Iron deficiency anemia MGUS Morbid obesity OSA Vitamin D deficeincy Generalized weakness HFrEF  Past Surgical History: Past Surgical History:  Procedure Laterality Date   BONE MARROW BIOPSY  02/2014   CATARACT EXTRACTION W/ INTRAOCULAR LENS  IMPLANT, BILATERAL Bilateral    CHOLECYSTECTOMY     HERNIA REPAIR     INSERTION OF MESH  10/2006   IR RADIOLOGIST EVAL & MGMT  08/18/2017   PANCREAS SURGERY     "placed stents; multiple pseudo cysts removal"   SKIN GRAFT     TRACHEOSTOMY  2007   VENTRAL HERNIA REPAIR     Family History: Unable  to obtain.  Social History: Lives with his son and daughter. His son helps with ADLs and IADLs. He sometimes uses a cane for ambulation. Denies alcohol, tobacco, illicit substance use. [facility]  Review of Systems: A complete ROS was negative except as per HPI.   Physical Exam: Blood pressure (!) 115/59, pulse (!) 115, temperature 98.2 F (36.8 C), temperature source Oral, resp. rate 20, weight 134.2 kg, SpO2 (!) 87%. Constitutional:Appears older than stated age. In no acute  distress. Cardio:Regular rate with irregular rhythm. Pulm:Clear to auscultation bilaterally on anterior auscultation. Normal work of breathing on 3L [place]. Abdomen:Soft, nontender, nondistended. MWU:[id number] for extremity edema. Skin:Warm and dry. Neuro: No focal deficit noted. Will not engage in exam of LE strength. Lifts arms and holds them for 5 seconds without droop. Does not follow commands for remainder of neuro exam, rather goes back to sleep. Psych:Pleasant mood and affect.  EKG: personally reviewed my interpretation is atrial fibrillation with nonspecific T wave abnormality in anterolateral leads.  CXR: Low lung volume with bibasilar atelectasis/scarring  Pelvis x-ray: Limited exam but without gross acute abnormality. Consideration regarding cross-sectional examination should be based on patient's symptomatology and physical examination findings.  CT Head: 1. No acute finding. 2. 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  CT abdomen/pelvis (10/06): 1. Findings of chronic pancreatitis. No evidence of acute pancreatitis. 2. Scattered cystic pancreatic lesions are again seen. The largest is slightly increased in size measuring 2.1 cm, previously 1.6 cm in the posterior body of the pancreas. These may represent side branch IPMN or pseudocysts. Consider follow-up pancreatic protocol MRI/MRCP with and without contrast in 12 months. 3. Prostatomegaly.  Assessment & Plan by Problem: Principal Problem:   Weakness generalized  Weakness Suspect multifactorial in setting of chronic low back pain, chronic weakness, increased amount of IV opioids prior to discharge from ED given improvement of clinical status with Narcan per EMS. He received at least 3 rounds of IV opioids. Cannot rule out acute infarct 2/2 patient refusal of MRI, CTA; CT head with chronic infarct. TIA in differential but low suspicion of this. Minimally hypercarbic. Plan: -Neurology consulted, low suspicion for CVA,  signed off; recommending OP f/u -PT/OT consult -Assess CVA risk factor control/modifactions: HbA1c, lipid panel, normotension, atrial fibrillation -STAT CT head if changes in neurological status  Acute on chronic respiratory failure VBG reviewed, is actually hypercarbic and with elevated oxygen level. Suspect some component of CO2 retention given history of OSA, chronic O2 dependence. Saturating well on [place]. He does have this PRN as outpatient. Plan: -Supplemental O2 PRN  Atrial fibrillation not on anticoagulation CHA2DS2-VASc score 7 In irregularly irregular rhythm here that is intermittently rate controlled. On carvedilol 3.125 mg BID as OP. Declines outpatient anticoagulation. Plan: -Resume home carvedilol 3.125 mg BID -Discuss anticoagulation/reasoning for not wanting this once patient is more interactive -F/u LE DVT US  Chronic pain 2/2 chronic pancreatitis Degenerative disc disease Chronic opioid use PDMP reviewed, patient picks up oxycodone 10 mg tablets, 120 for total of 30 day supply regularly. Concern for accidental overdose given response to narcan after IV opioids overnight and will need cautious administration of opioid medications while admitted. He was able to eat prior to discharge overnight. Lipase then was 140, previously higher than that (170s-180s) when admitted for acute on chronic pancreatitis. No imaging findings suggestive of acute pancreatitis. On my exam there are no signs of abdominal pain. Plan: -Pain control: tylenol 650 mg q6h PRN mild pain, fever; oxycodone 5 mg q4h PRN  moderate-severe pain -NPO except sips with meds, advance diet as tolerated -Recommend pancreatic protocol MRI/MRCP with and without contrast in 12 months to f/u IPMN  HFrEF (LV EF 20-25% 06/2021) On digoxin, level pending. CXR without evidence of volume overload, BNP elevated to 235.6. Clinically no peripheral pitting edema and JVD difficult to assess due to body habitus. Is not maximized on  recommended GDMT. Plan: -Will give diuresis challenge with IV lasix 40 mg x1 -Continue home digoxin, carvedilol -Consider advancing GDMT prior to discharge -Strict I's & O's, daily weights  OSA Morbid obesity Has history of ventilator dependence and tracheostomy. On 2-4L [place] PRN outpatient. Does not use CPAP at night. Plan: -Continue supplemental O2 PRN  Type 2 diabetes mellitus w/ neuropathy Hyperglycemic now. OP regimen includes lantus 40 units daily, novolog SSI TID. Currently NPO. Plan: -Resume home lantus at 20 units daily, SSI TID with meals and at bedtime; increase doses as indicated -Check HbA1c  HLD Not on a statin as OP. Plan: -Check lipid panel  HTN BP stable at this time. OP regimen includes carvedilol 3.125 mg BID, furosemide for volume status. Plan: -Hold off on starting additional anti-hypertensives  Hx MGUS Hx iron deficiency Normocytic anemia Mild thrombocytopenia Abnormalities are stable from prior lab results. Plan: -Trend CBC -Check iron studies  Dispo: Admit patient to Inpatient with expected length of stay greater than 2 midnights.  SignedChamp Mungo, DO 12/28/2022, 10:48 AM  After 5pm on weekdays and 1pm on weekends: On Call pager: [phone]

## 2022-12-28 NOTE — Consult Note (Signed)
NEUROLOGY CONSULTATION NOTE   Date of service: December 28, 2022 Patient Name: Don Brown MRN:  [id number] DOB:  06/29/1955 Reason for consult: "Stroke code, confusion and concern for left sided weakness and R facial droop" Requesting Provider: No att. providers found _ _ _   _ __   _ __ _ _  __ __   _ __   __ _  History of Present Illness  Barrett Worlds is a 67 y.o. male with PMH significant for atrial fibrillation who is not on anticoagulation, degenerative disc disease, MGUS, hyperlipidemia, morbid obesity, congestive heart failure, OSA, diabetes type 2 who presents with confusion, fall at home and concerned by EMS about potential left-sided weakness and a right facial droop.  Patient was seen last night at [facility] for chronic pancreatitis.  He got some IV opioids and he was sent home.  His discharge time was 0050 on 12/28/2022.  Patient reports that he was in a wheelchair, he had trouble transferring himself into his daughter's vehicle and when he got to home, he sat in a reclining chair.  He is unsure but he thinks around 4 AM, he tried to get up and he fell.  Called EMS and he was noted to be confused, pinpoint pupils and hypoxic.  He was given Narcan with improvement and EMS had potential concerns for left-sided weakness and right facial droop and therefore he was brought in as a code stroke.  EMS also mentions that he was satting in the 70s and was started on nonrebreather with oxygen with improvement in his sats.  EMS also reports that he was noted to be in A-fib with RVR with elevated heart rate which had improved to 80s upon arrival to the ED.  Upon arrival, patient is more awake.  He reports back pain.  Reports that this pain has been chronic.  He can wiggle his toes and dorsiflexion and plantarflexion is 5 out of 5 but declined to lift his legs up at the worsens his pain.  His sensation is intact in bilateral lower extremities. Reports nausea. EMS gave him sone zofran.   LKW: 0050  12/28/2022 mRS: 3 tNKASE: not offered, outside window, difficult to establish a LKW despite extensive discussion with patient. Presumed LKW is discharge time from [facility] ED. Thrombectomy: not offered, declined further imaging including CTA  and MRI. Understands that this may result in missing out on a stroke. NIHSS components Score: Comment  1a Level of Conscious 0[x]  1[]  2[]  3[]      1b LOC Questions 0[x]  1[]  2[]       1c LOC Commands 0[x]  1[]  2[]       2 Best Gaze 0[x]  1[]  2[]       3 Visual 0[x]  1[]  2[]  3[]      4 Facial Palsy 0[x]  1[]  2[]  3[]      5a Motor Arm - left 0[x]  1[]  2[]  3[]  4[]  UN[]    5b Motor Arm - Right 0[x]  1[]  2[]  3[]  4[]  UN[]    6a Motor Leg - Left 0[]  1[]  2[]  3[x]  4[]  UN[]  Dorsiflexion and plantar flexion 5/5 BL. Declined to lift his legs up due to pain in the back  6b Motor Leg - Right 0[]  1[]  2[]  3[x]  4[]  UN[]    7 Limb Ataxia 0[]  1[]  2[]  3[x]  UN[]     8 Sensory 0[x]  1[]  2[]  UN[]      9 Best Language 0[x]  1[]  2[]  3[]      10 Dysarthria 0[]  1[x]  2[]  UN[]      11 Extinct. and  Inattention 0[x]  1[]  2[]       TOTAL: 7      ROS   Constitutional Denies weight loss, fever and chills.   HEENT Denies changes in vision and hearing.   Respiratory + SOB and cough.   CV Denies palpitations and CP  GI + abdominal pain, nausea, vomiting but no diarrhea.  GU Denies dysuria and urinary frequency.   MSK Denies myalgia and joint pain.  Skin Denies rash and pruritus.   Neurological Denies headache and syncope.  Psychiatric Denies recent changes in mood. Denies anxiety and depression.   Past History   Past Medical History:  Diagnosis Date   Anemia    Anxiety    Atrial fibrillation (HCC)    Cervical compression fracture (HCC)    Cervical disk disease requiring surgery   CHF (congestive heart failure) (HCC)    Chronic pain    Chronic systolic heart failure (HCC)    LVEF 40-45%   Degenerative joint disease    Depression    Essential hypertension, benign    Headache    "stress"    Heart murmur    History of blood transfusion 2007   "related to OR"   History of renal failure    Requiring hemodialysis in the past with normal renal function at this point   History of ventral hernia repair    Hyperlipidemia    MGUS (monoclonal gammopathy of unknown significance)    "or myelona" (05/07/2014)   Mitral valve prolapse    Morbid obesity (HCC)    Myeloma (HCC)    "or MGUS" (05/07/2014)   Narcotic dependence, episodic use (HCC)    On home oxygen therapy    "2L prn" (05/07/2014)   Pancreatitis    Severe pancreatitis status post surgery with 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 status post multiple endoscopic drainage procedures and stents    Respiratory failure (HCC)    History of ventilatory-dependent respiratory failure and tracheostomy    Sleep apnea    "don't wear mask" (05/07/2014)   Type 2 diabetes mellitus (HCC)    Past Surgical History:  Procedure Laterality Date   BONE MARROW BIOPSY  02/2014   CATARACT EXTRACTION W/ INTRAOCULAR LENS  IMPLANT, BILATERAL Bilateral    CHOLECYSTECTOMY     HERNIA REPAIR     INSERTION OF MESH  10/2006   IR RADIOLOGIST EVAL & MGMT  08/18/2017   PANCREAS SURGERY     "placed stents; multiple pseudo cysts removal"   SKIN GRAFT     TRACHEOSTOMY  2007   VENTRAL HERNIA REPAIR     Family History  Problem Relation Age of Onset   Lymphoma Father    Hypertension Mother    Diabetes Mother    Social History   Socioeconomic History   Marital status: Single    Spouse name: Not on file   Number of children: Not on file   Years of education: Not on file   Highest education level: Not on file  Occupational History   Not on file  Tobacco Use   Smoking status: Never   Smokeless tobacco: Never  Vaping Use   Vaping status: Never Used  Substance and Sexual Activity   Alcohol use: No   Drug use: Yes    Comment: prescribed dilaudid, oxycodone   Sexual activity: Not Currently  Other Topics Concern   Not on file  Social  History Narrative   Not on file   Social Determinants of Health   Financial Resource Strain:  Not on file  Food Insecurity: Not on file  Transportation Needs: Not on file  Physical Activity: Not on file  Stress: Not on file  Social Connections: Not on file   Allergies  Allergen Reactions   Morphine And Codeine Nausea And Vomiting    Medications  (Not in a hospital admission)    Vitals   Vitals:   12/28/22 0700  Weight: 134.2 kg     Body mass index is 40.13 kg/m.  Physical Exam   General: Laying comfortably in bed; agitated easily. HENT: Normal oropharynx and mucosa. Normal external appearance of ears and nose.  Neck: Supple, no pain or tenderness  CV: No JVD. No peripheral edema.  Pulmonary: Symmetric Chest rise. Normal respiratory effort.  Abdomen: Soft to touch, non-tender.  Ext: No cyanosis, edema, or deformity  Skin: No rash. Normal palpation of skin.   Musculoskeletal: Normal digits and nails by inspection. No clubbing.   Neurologic Examination  Mental status/Cognition: Alert, oriented to self, place, month and year, good attention. Speech/language: dysarthric, fluent, comprehension intact, object naming intact, repetition intact.  Cranial nerves:   CN II Pupils equal and reactive to light, no VF deficits    CN III,IV,VI EOM intact, no gaze preference or deviation, no nystagmus    CN V normal sensation in V1, V2, and V3 segments bilaterally    CN VII no asymmetry, no nasolabial fold flattening    CN VIII normal hearing to speech    CN IX & X normal palatal elevation, no uvular deviation    CN XI 5/5 head turn and 5/5 shoulder shrug bilaterally    CN XII midline tongue protrusion    Motor:  Muscle bulk: normal, tone normal, pronator drift none tremor none Mvmt Root Nerve  Muscle Right Left Comments  SA C5/6 Ax Deltoid     EF C5/6 Mc Biceps 5 5   EE C6/7/8 Rad Triceps 5 5   WF C6/7 Med FCR     WE C7/8 PIN ECU     F Ab C8/T1 U ADM/FDI 5 5   HF L1/2/3  Fem Illopsoas - - Would not lift his legs up despite coaxing/encouragement due to pain in his back exacerbated by hip flexion  KE L2/3/4 Fem Quad     DF L4/5 D Peron Tib Ant 5 5   PF S1/2 Tibial Grc/Sol 5 5    Sensation:  Light touch Intact throughout   Pin prick    Temperature    Vibration   Proprioception    Coordination/Complex Motor:  - Finger to Nose intact BL - Heel to shin declined to do. - Rapid alternating movement are slowed BL - Gait: deferred. Reports uses wheelchair scooter at baseline.  Labs   CBC:  Recent Labs  Lab 12/27/22 2000 12/28/22 0712 12/28/22 0717  WBC 7.8 7.4  --   NEUTROABS  --  6.7  --   HGB 13.2 12.6* 15.0  HCT 43.2 42.7 44.0  MCV 89.6 90.3  --   PLT 144* 149*  --     Basic Metabolic Panel:  Lab Results  Component Value Date   NA 139 12/28/2022   K 5.2 (H) 12/28/2022   CO2 23 12/27/2022   GLUCOSE 383 (H) 12/28/2022   BUN 22 12/28/2022   CREATININE 1.00 12/28/2022   CALCIUM 8.6 (L) 12/27/2022   GFRNONAA >60 12/27/2022   GFRAA >60 02/08/2017   Lipid Panel:  Lab Results  Component Value Date   [facility]  09/24/2009  53        Total Cholesterol/HDL:CHD Risk Coronary Heart Disease Risk Table                     Men   Women  1/2 Average Risk   3.4   3.3  Average Risk       5.0   4.4  2 X Average Risk   9.6   7.1  3 X Average Risk  23.4   11.0        Use the calculated Patient Ratio above and the CHD Risk Table to determine the patient's CHD Risk.        ATP III CLASSIFICATION (LDL):  <100     mg/dL   Optimal  161-096  mg/dL   Near or Above                    Optimal  130-159  mg/dL   Borderline  045-409  mg/dL   High  >811     mg/dL   Very High   [id number]:  Lab Results  Component Value Date   HGBA1C 9.1 (A) 05/06/2021   Urine Drug Screen:     Component Value Date/Time   LABOPIA POSITIVE (A) 05/07/2014 0224   COCAINSCRNUR NONE DETECTED 05/07/2014 0224   LABBENZ NONE DETECTED 05/07/2014 0224   AMPHETMU NONE DETECTED  05/07/2014 0224   THCU NONE DETECTED 05/07/2014 0224   LABBARB NONE DETECTED 05/07/2014 0224    Alcohol Level     Component Value Date/Time   ETH <10 11/13/2021 2315   INR  Lab Results  Component Value Date   INR 0.92 01/22/2016   APTT  Lab Results  Component Value Date   APTT 29 05/28/2011     CT Head without contrast(Personally reviewed): CTH was negative for a large hypodensity concerning for a large territory infarct or hyperdensity concerning for an ICH  Impression   Theartis Sweezey is a 67 y.o. male with PMH significant for atrial fibrillation who is not on anticoagulation, degenerative disc disease, MGUS, hyperlipidemia, morbid obesity, congestive heart failure, OSA, diabetes type 2 who presents with confusion, fall at home and improved with narcan per EMS but concern for potential left sided weakness and R facial droop and brought in as a code stroke.  He is a bit stubborn which makes it difficult to do a full exam. However, no weakness noted in BL uppers, no obvious cranial nerve deficit except for slurred speech which could be explained by dryness of oral mucosa and oropharynx. He declined to participate with lower ext testing due to back pain and reports that this makes it worse. Dorsiflexion and plantar flexion is 5/5 BL with intact sensation.  He declined CTA and MRI or further imaging.  Recommendations  - PT, OT. - low suspicion for stroke. We will signoff. - discussed risks and benefits of starting eliquis to reduce risk of strokes in the future but he declined. - follow up with neurology outpatient.  ______________________________________________________________________   Thank you for the opportunity to take part in the care of this patient. If you have any further questions, please contact the neurology consultation attending.  Signed,  Erick Blinks [facility] _ _ _   _ __   _ __ _ _  __ __   _ __   __ _

## 2022-12-28 NOTE — ED Notes (Signed)
ED TO INPATIENT HANDOFF REPORT  ED Nurse Name and Phone #:  [phone]  S Name/Age/Gender Don Brown 67 y.o. male Room/Bed: 016C/016C  Code Status   Code Status: Full Code  Home/SNF/Other Home Patient oriented to: self, place, time, and situation Is this baseline? Yes   Triage Complete: Triage complete  Chief Complaint Weakness generalized [R53.1]  Triage Note Pt BIB [facility] after a fall. Upon EMS arrival, pt was weak in L arm and L leg and code stroke was called at that time. Pt was in A fib RVR rate 150 en route per EMS.    Allergies Allergies  Allergen Reactions   Morphine And Codeine Nausea And Vomiting    Level of Care/Admitting Diagnosis ED Disposition     ED Disposition  Admit   Condition  --   Comment  Hospital Area: [facility] [[id number]]  Level of Care: Progressive [102]  Admit to Progressive based on following criteria: NEUROLOGICAL AND NEUROSURGICAL complex patients with significant risk of instability, who do not meet ICU criteria, yet require close observation or frequent assessment (< / = every 2 - 4 hours) with medical / nursing intervention.  May admit patient to [facility] or [facility] if equivalent level of care is available:: No  Covid Evaluation: Asymptomatic - no recent exposure (last 10 days) testing not required  Diagnosis: Weakness generalized [241811]  Admitting Physician: [name] [[id number]]  Attending Physician: [name] [[id number]]  Certification:: I certify this patient will need inpatient services for at least 2 midnights  Expected Medical Readiness: 12/30/2022          B Medical/Surgery History Past Medical History:  Diagnosis Date   Anemia    Anxiety    Atrial fibrillation (HCC)    Cervical compression fracture (HCC)    Cervical disk disease requiring surgery   CHF (congestive heart failure) (HCC)    Chronic pain    Chronic systolic heart failure (HCC)    LVEF 40-45%   Degenerative  joint disease    Depression    Essential hypertension, benign    Headache    "stress"   Heart murmur    History of blood transfusion 2007   "related to OR"   History of renal failure    Requiring hemodialysis in the past with normal renal function at this point   History of ventral hernia repair    Hyperlipidemia    MGUS (monoclonal gammopathy of unknown significance)    "or myelona" (05/07/2014)   Mitral valve prolapse    Morbid obesity (HCC)    Myeloma (HCC)    "or MGUS" (05/07/2014)   Narcotic dependence, episodic use (HCC)    On home oxygen therapy    "2L prn" (05/07/2014)   Pancreatitis    Severe pancreatitis status post surgery with 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 status post multiple endoscopic drainage procedures and stents    Respiratory failure (HCC)    History of ventilatory-dependent respiratory failure and tracheostomy    Sleep apnea    "don't wear mask" (05/07/2014)   Type 2 diabetes mellitus (HCC)    Past Surgical History:  Procedure Laterality Date   BONE MARROW BIOPSY  02/2014   CATARACT EXTRACTION W/ INTRAOCULAR LENS  IMPLANT, BILATERAL Bilateral    CHOLECYSTECTOMY     HERNIA REPAIR     INSERTION OF MESH  10/2006   IR RADIOLOGIST EVAL & MGMT  08/18/2017   PANCREAS SURGERY     "placed stents; multiple pseudo  cysts removal"   SKIN GRAFT     TRACHEOSTOMY  2007   VENTRAL HERNIA REPAIR       A IV Location/Drains/Wounds Patient Lines/Drains/Airways Status     Active Line/Drains/Airways     Name Placement date Placement time Site Days   Peripheral IV 12/28/22 20 G Anterior;Left Forearm 12/28/22  0752  Forearm  less than 1            Intake/Output Last 24 hours  Intake/Output Summary (Last 24 hours) at 12/28/2022 1638 Last data filed at 12/28/2022 1151 Gross per 24 hour  Intake 3 ml  Output --  Net 3 ml    Labs/Imaging Results for orders placed or performed during the hospital encounter of 12/28/22 (from the past 48 hour(s))   CBG monitoring, ED     Status: Abnormal   Collection Time: 12/28/22  7:10 AM  Result Value Ref Range   Glucose-Capillary 336 (H) 70 - 99 mg/dL    Comment: Glucose reference range applies only to samples taken after fasting for at least 8 hours.  Protime-INR     Status: None   Collection Time: 12/28/22  7:12 AM  Result Value Ref Range   Prothrombin Time 14.8 11.4 - 15.2 seconds   INR 1.1 0.8 - 1.2    Comment: (NOTE) INR goal varies based on device and disease states. Performed at [facility], [address], [place], [place] [address]   APTT     Status: None   Collection Time: 12/28/22  7:12 AM  Result Value Ref Range   aPTT 26 24 - 36 seconds    Comment: Performed at [facility], [address] [place], [place] [address]  CBC     Status: Abnormal   Collection Time: 12/28/22  7:12 AM  Result Value Ref Range   WBC 7.4 4.0 - 10.5 K/uL   RBC 4.73 4.22 - 5.81 MIL/uL   Hemoglobin 12.6 (L) 13.0 - 17.0 g/dL   HCT 13.2 44.0 - 10.2 %   MCV 90.3 80.0 - 100.0 fL   MCH 26.6 26.0 - 34.0 pg   MCHC 29.5 (L) 30.0 - 36.0 g/dL   RDW 72.5 36.6 - 44.0 %   Platelets 149 (L) 150 - 400 K/uL   nRBC 0.0 0.0 - 0.2 %    Comment: Performed at [facility], [address] [place], [place] [address]  Differential     Status: Abnormal   Collection Time: 12/28/22  7:12 AM  Result Value Ref Range   Neutrophils Relative % 91 %   Neutro Abs 6.7 1.7 - 7.7 K/uL   Lymphocytes Relative 5 %   Lymphs Abs 0.4 (L) 0.7 - 4.0 K/uL   Monocytes Relative 4 %   Monocytes Absolute 0.3 0.1 - 1.0 K/uL   Eosinophils Relative 0 %   Eosinophils Absolute 0.0 0.0 - 0.5 K/uL   Basophils Relative 0 %   Basophils Absolute 0.0 0.0 - 0.1 K/uL   Immature Granulocytes 0 %   Abs Immature Granulocytes 0.03 0.00 - 0.07 K/uL    Comment: Performed at [facility], [address] [place], [place] [address]  Ethanol     Status: None   Collection Time: 12/28/22  7:12 AM  Result Value Ref Range   Alcohol,  Ethyl (B) <10 <10 mg/dL    Comment: (NOTE) Lowest detectable limit for serum alcohol is 10 mg/dL.  For medical purposes only. Performed at [facility]  Lab, [address] [place], [place] [address]   I-stat chem 8, ED     Status: Abnormal   Collection Time: 12/28/22  7:17 AM  Result Value Ref Range   Sodium 139 135 - 145 mmol/L   Potassium 5.2 (H) 3.5 - 5.1 mmol/L   Chloride 101 98 - 111 mmol/L   BUN 22 8 - 23 mg/dL   Creatinine, Ser 7.25 0.61 - 1.24 mg/dL   Glucose, Bld 366 (H) 70 - 99 mg/dL    Comment: Glucose reference range applies only to samples taken after fasting for at least 8 hours.   Calcium, Ion 1.07 (L) 1.15 - 1.40 mmol/L   TCO2 29 22 - 32 mmol/L   Hemoglobin 15.0 13.0 - 17.0 g/dL   HCT 44.0 34.7 - 42.5 %  Comprehensive metabolic panel     Status: Abnormal   Collection Time: 12/28/22  8:00 AM  Result Value Ref Range   Sodium 136 135 - 145 mmol/L   Potassium 4.4 3.5 - 5.1 mmol/L   Chloride 100 98 - 111 mmol/L   CO2 31 22 - 32 mmol/L   Glucose, Bld 369 (H) 70 - 99 mg/dL    Comment: Glucose reference range applies only to samples taken after fasting for at least 8 hours.   BUN 15 8 - 23 mg/dL   Creatinine, Ser 9.56 0.61 - 1.24 mg/dL   Calcium 8.5 (L) 8.9 - 10.3 mg/dL   Total Protein 7.8 6.5 - 8.1 g/dL   Albumin 3.1 (L) 3.5 - 5.0 g/dL   AST 17 15 - 41 U/L   ALT 19 0 - 44 U/L   Alkaline Phosphatase 58 38 - 126 U/L   Total Bilirubin 1.0 0.3 - 1.2 mg/dL   GFR, Estimated >38 >75 mL/min    Comment: (NOTE) Calculated using the CKD-EPI Creatinine Equation (2021)    Anion gap 5 5 - 15    Comment: Performed at [facility], [address] [place], [place] [address]  Digoxin level     Status: Abnormal   Collection Time: 12/28/22  8:11 AM  Result Value Ref Range   Digoxin Level 0.5 (L) 0.8 - 2.0 ng/mL    Comment: Performed at [facility], [address] [place], [place] [address]  Brain natriuretic peptide     Status: Abnormal   Collection Time:  12/28/22  8:11 AM  Result Value Ref Range   B Natriuretic Peptide 235.6 (H) 0.0 - 100.0 pg/mL    Comment: Performed at [facility], [address], [place], [place] [address]  I-Stat venous blood gas, [facility], [facility])     Status: Abnormal   Collection Time: 12/28/22  9:44 AM  Result Value Ref Range   pH, Ven 7.334 7.25 - 7.43   pCO2, Ven 60.3 (H) 44 - 60 mmHg   pO2, Ven 54 (H) 32 - 45 mmHg   Bicarbonate 32.0 (H) 20.0 - 28.0 mmol/L   TCO2 34 (H) 22 - 32 mmol/L   O2 Saturation 84 %   Acid-Base Excess 4.0 (H) 0.0 - 2.0 mmol/L   Sodium 141 135 - 145 mmol/L   Potassium 4.2 3.5 - 5.1 mmol/L   Calcium, Ion 1.11 (L) 1.15 - 1.40 mmol/L   HCT 42.0 39.0 - 52.0 %   Hemoglobin 14.3 13.0 - 17.0 g/dL   Sample type VENOUS   CBG monitoring, ED     Status: Abnormal   Collection Time: 12/28/22 11:28  AM  Result Value Ref Range   Glucose-Capillary 278 (H) 70 - 99 mg/dL    Comment: Glucose reference range applies only to samples taken after fasting for at least 8 hours.   VAS [place] LOWER EXTREMITY VENOUS (DVT) (7a-7p)  Result Date: 12/28/2022  Lower Venous DVT Study Patient Name:  Don Brown  Date of Exam:   12/28/2022 Medical Rec #: [id number]          Accession #:    [id number] Date of Birth: 09-10-1955          Patient Gender: M Patient Age:   59 years Exam Location:  [facility] Procedure:      VAS [place] LOWER EXTREMITY VENOUS (DVT) Referring Phys: Janey Genta NANAVATI --------------------------------------------------------------------------------  Indications: SOB.  Limitations: Poor ultrasound/tissue interface and patient would not remove pants, limited visualization of screen secondary to bright light that could not be turned off. Comparison Study: No prior study on file Performing Technologist: Sherren Kerns RVS  Examination Guidelines: A complete evaluation includes B-mode imaging, spectral Doppler, color Doppler, and power Doppler as needed of all accessible portions of each vessel.  Bilateral testing is considered an integral part of a complete examination. Limited examinations for reoccurring indications may be performed as noted. The reflux portion of the exam is performed with the patient in reverse Trendelenburg.  +---------+---------------+---------+-----------+----------+-------------------+ RIGHT    CompressibilityPhasicitySpontaneityPropertiesThrombus Aging      +---------+---------------+---------+-----------+----------+-------------------+ CFV                     Yes      Yes                  patent by color and                                                       Doppler             +---------+---------------+---------+-----------+----------+-------------------+ FV Prox  Full           Yes      Yes                                      +---------+---------------+---------+-----------+----------+-------------------+ FV Mid                                                Not well visualized +---------+---------------+---------+-----------+----------+-------------------+ FV DistalFull           Yes      Yes                                      +---------+---------------+---------+-----------+----------+-------------------+ PFV      Full                                                             +---------+---------------+---------+-----------+----------+-------------------+ POP  Full           Yes      Yes                                      +---------+---------------+---------+-----------+----------+-------------------+ PTV      Full                                                             +---------+---------------+---------+-----------+----------+-------------------+ PERO                                                  Not well visualized +---------+---------------+---------+-----------+----------+-------------------+   +---------+---------------+---------+-----------+----------+-------------------+ LEFT      CompressibilityPhasicitySpontaneityPropertiesThrombus Aging      +---------+---------------+---------+-----------+----------+-------------------+ CFV      Full           Yes      Yes                                      +---------+---------------+---------+-----------+----------+-------------------+ SFJ      Full                                                             +---------+---------------+---------+-----------+----------+-------------------+ FV Prox  Full                                                             +---------+---------------+---------+-----------+----------+-------------------+ FV Mid                                                Not well visualized +---------+---------------+---------+-----------+----------+-------------------+ FV Distal                                             Not well visualized +---------+---------------+---------+-----------+----------+-------------------+ PFV      Full                                                             +---------+---------------+---------+-----------+----------+-------------------+ POP      Full           Yes      Yes  patent by color and                                                       Doppler             +---------+---------------+---------+-----------+----------+-------------------+ PTV      Full                                                             +---------+---------------+---------+-----------+----------+-------------------+ PERO                                                  Not well visualized +---------+---------------+---------+-----------+----------+-------------------+    Summary: RIGHT: - There is no evidence of deep vein thrombosis in the lower extremity. However, portions of this examination were limited- see technologist comments above.  LEFT: - There is no evidence of deep vein thrombosis in the lower extremity.  However, portions of this examination were limited- see technologist comments above.  *See table(s) above for measurements and observations.    Preliminary    DG Pelvis Portable  Result Date: 12/28/2022 CLINICAL DATA:  shob, fall.  Left arm and left leg weakness. EXAM: PORTABLE PELVIS 1-2 VIEWS COMPARISON:  None Available. FINDINGS: Examination is limited due to technique and patient related factors. No acute fracture or dislocation. No aggressive osseous lesion. Visualized sacral arcuate lines are grossly unremarkable. Unremarkable symphysis pubis. There are mild degenerative changes of bilateral hip joints without significant joint space narrowing. Osteophytosis of the superior acetabulum. No radiopaque foreign bodies. IMPRESSION: Limited exam but without gross acute abnormality. Consideration regarding cross-sectional examination should be based on patient's symptomatology and physical examination findings. Electronically Signed   By: Jules Schick M.D.   On: 12/28/2022 08:14   DG Chest Port 1 View  Result Date: 12/28/2022 CLINICAL DATA:  Shortness of breath.  Fall. EXAM: PORTABLE CHEST 1 VIEW COMPARISON:  07/18/2021. FINDINGS: Low lung volume. Elevated right hemidiaphragm, similar to the prior study. There are bibasilar atelectasis/scarring, essentially similar to the prior study. Redemonstration of linear area of scarring/atelectasis overlying the right mid lung zone. No large pleural effusion seen. Subtle blunting of left lateral costophrenic angle is similar to the prior study and likely secondary to overlying soft tissue/lung parenchymal opacities. Stable cardio-mediastinal silhouette. No acute osseous abnormalities. The soft tissues are within normal limits. IMPRESSION: *Low lung volume with bibasilar atelectasis/scarring. Electronically Signed   By: Jules Schick M.D.   On: 12/28/2022 08:12   CT HEAD CODE STROKE WO CONTRAST  Result Date: 12/28/2022 CLINICAL DATA:  Code stroke.  Acute stroke  suspected EXAM: CT HEAD WITHOUT CONTRAST TECHNIQUE: Contiguous axial images were obtained from the base of the skull through the vertex without intravenous contrast. RADIATION DOSE REDUCTION: This exam was performed according to the departmental dose-optimization program which includes automated exposure control, adjustment of the mA and/or kV according to patient size and/or use of iterative reconstruction technique. COMPARISON:  04/30/2013 FINDINGS: Brain: No evidence of acute infarction, hemorrhage, hydrocephalus, extra-axial collection or mass  lesion/mass effect. Diffusely suboptimal gray-white differentiation attributed to hyperostosis. 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 Vascular: No hyperdense vessel or unexpected calcification. Tortuous appearance of intracranial vessels. Skull: Normal. Negative for fracture or focal lesion. Sinuses/Orbits: No acute finding. Other: Prelim sent in epic chat. ASPECTS [facility] Stroke Program Early CT Score) -on both sides, deficit not known - Ganglionic level infarction (caudate, lentiform nuclei, internal capsule, insula, M1-M3 cortex): 7 - Supraganglionic infarction (M4-M6 cortex): 3 Total score (0-10 with 10 being normal): 10 IMPRESSION: 1. No acute finding. 2. 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 Electronically Signed   By: Tiburcio Pea M.D.   On: 12/28/2022 07:26   CT ABDOMEN PELVIS WO CONTRAST  Result Date: 12/27/2022 CLINICAL DATA:  Epigastric pain, query recurrent pancreatitis. Nausea and vomiting. Patient declined IV contrast. EXAM: CT ABDOMEN AND PELVIS WITHOUT CONTRAST TECHNIQUE: Multidetector CT imaging of the abdomen and pelvis was performed following the standard protocol without IV contrast. RADIATION DOSE REDUCTION: This exam was performed according to the departmental dose-optimization program which includes automated exposure control, adjustment of the mA and/or kV according to patient size and/or use of iterative reconstruction  technique. COMPARISON:  CT abdomen and pelvis 12/26/2021 FINDINGS: Lower chest: Bibasilar atelectasis.  No acute abnormality. Hepatobiliary: Hepatic cysts. Gallbladder is unremarkable. No biliary dilation. Pancreas: Pancreatic calcifications, pancreatic ductal dilation, pancreatic atrophy is redemonstrated. Scattered cystic pancreatic lesions are again seen. The largest is slightly increased in size measuring 2.1 cm, previously 1.6 cm in the posterior body of the pancreas on series 2/image 41. No peripancreatic stranding to suggest acute inflammation. Spleen: Unremarkable. Adrenals/Urinary Tract: Stable adrenal glands and kidneys. No urinary calculi or hydronephrosis. Unremarkable bladder. Stomach/Bowel: Normal caliber large and small bowel. Normal appendix. Stomach is within normal limits. Vascular/Lymphatic: Aortic atherosclerosis. No enlarged abdominal or pelvic lymph nodes. Reproductive: Prostatomegaly. Other: No free intraperitoneal fluid or air. Similar laxity of the ventral abdominal wall. Musculoskeletal: No acute fracture. IMPRESSION: 1. Findings of chronic pancreatitis. No evidence of acute pancreatitis. 2. Scattered cystic pancreatic lesions are again seen. The largest is slightly increased in size measuring 2.1 cm, previously 1.6 cm in the posterior body of the pancreas. These may represent side branch IPMN or pseudocysts. Consider follow-up pancreatic protocol MRI/MRCP with and without contrast in 12 months. 3. Prostatomegaly. Aortic Atherosclerosis (ICD10-I70.0). Electronically Signed   By: Minerva Fester M.D.   On: 12/27/2022 21:59    Pending Labs Unresulted Labs (From admission, onward)     Start     Ordered   12/29/22 0500  Comprehensive metabolic panel  Tomorrow morning,   R        12/28/22 0959   12/29/22 0500  CBC  Tomorrow morning,   R        12/28/22 0959   12/28/22 1100  Iron and TIBC  Once,   R        12/28/22 1101   12/28/22 1100  Ferritin  Once,   R        12/28/22 1101    12/28/22 1014  Lipid panel  Once,   R        12/28/22 1101   12/28/22 1014  Hemoglobin A1c  Once,   R        12/28/22 1101   12/28/22 0958  HIV Antibody (routine testing w rflx)  (HIV Antibody (Routine testing w reflex) panel)  Once,   R        12/28/22 1610  Vitals/Pain Today's Vitals   12/28/22 1402 12/28/22 1419 12/28/22 1500 12/28/22 1530  BP: (!) 91/52  (!) 153/91 (!) 160/88  Pulse: 90 93 87 89  Resp: 14 14 12 15   Temp: 98.4 F (36.9 C)     TempSrc: Oral     SpO2: 97% 97% 96% 92%  Weight:        Isolation Precautions No active isolations  Medications Medications  enoxaparin (LOVENOX) injection 40 mg (40 mg Subcutaneous Not Given 12/28/22 1151)  acetaminophen (TYLENOL) tablet 650 mg (has no administration in time range)    Or  acetaminophen (TYLENOL) suppository 650 mg (has no administration in time range)  oxyCODONE (Oxy IR/ROXICODONE) immediate release tablet 5 mg (has no administration in time range)  carvedilol (COREG) tablet 3.125 mg (has no administration in time range)  insulin glargine-yfgn (SEMGLEE) injection 20 Units (has no administration in time range)  insulin aspart (novoLOG) injection 0-20 Units (11 Units Subcutaneous Given 12/28/22 1148)  insulin aspart (novoLOG) injection 0-5 Units (has no administration in time range)  digoxin (LANOXIN) 0.25 MG/ML injection 0.125 mg (0.125 mg Intravenous Given 12/28/22 1221)  sodium chloride flush (NS) 0.9 % injection 3 mL (3 mLs Intravenous Given 12/28/22 1151)  ketorolac (TORADOL) 15 MG/ML injection 10 mg (10 mg Intravenous Given 12/28/22 0913)  metoCLOPramide (REGLAN) injection 10 mg (10 mg Intravenous Given 12/28/22 0913)  furosemide (LASIX) injection 40 mg (40 mg Intravenous Given 12/28/22 1435)    Mobility walks with person assist     Focused Assessments Neuro Assessment Handoff:  Swallow screen pass? No    NIH Stroke Scale  Dizziness Present: No Headache Present: Yes Interval: Initial Level  of Consciousness (1a.)   : Not alert, but arousable by minor stimulation to obey, answer, or respond LOC Questions (1b. )   : Answers both questions correctly LOC Commands (1c. )   : Performs both tasks correctly Best Gaze (2. )  : Normal Visual (3. )  : No visual loss Facial Palsy (4. )    : Normal symmetrical movements Motor Arm, Left (5a. )   : No drift Motor Arm, Right (5b. ) : No drift Motor Leg, Left (6a. )  : No effort against gravity Motor Leg, Right (6b. ) : No effort against gravity Limb Ataxia (7. ): Absent Sensory (8. )  : Normal, no sensory loss Best Language (9. )  : No aphasia Dysarthria (10. ): Mild-to-moderate dysarthria, patient slurs at least some words and, at worst, can be understood with some difficulty Extinction/Inattention (11.)   : No Abnormality Complete NIHSS TOTAL: 8 Last date known well: 12/28/22 Last time known well: 0051 Neuro Assessment:   Neuro Checks:   Initial (12/28/22 0700)  Has TPA been given? No If patient is a Neuro Trauma and patient is going to OR before floor call report to 4N Charge nurse: [phone] or [phone]   R Recommendations: See Admitting Provider Note  Report given to:   Additional Notes:

## 2022-12-28 NOTE — Code Documentation (Signed)
Stroke Response Nurse Documentation Code Documentation  Don Brown is a 67 y.o. male arriving to [facility]  via [facility] on 12-28-2022 with past medical hx of AF, MGUS, morbid obesity, CHF, OSA, DM. On No antithrombotic. Code stroke was activated by EMS.   Patient from home where he was LKW at 0050 when he was discharged from [facility] ED and now complaining of confusion and left side weakness.  Per EMS they were called to home for a fall.  When they arrived patient was somnolent and required narcan.  As he started to arouse they noted Left side weakness and right gaze preference.  Upon arrival to [facility] ED he is more awake and conversant.  He has bilateral leg weakness.  Stroke team at the bedside on patient arrival. Labs drawn and patient cleared for CT by Dr. Rhunette Croft. Patient to CT with team. NIHSS 7, see documentation for details and code stroke times. Patient with bilateral leg weakness and dysarthria  on exam. The following imaging was completed:  CT Head. Patient is not a candidate for IV Thrombolytic due to LKW outside TNK window. Patient is not a candidate for IR due to refusal of CTA.   Care Plan: VS and NIHSS q 2 x 12 hours.   Bedside handoff with ED RN Casimiro Needle.    Marcellina Millin  Stroke Response RN

## 2022-12-28 NOTE — ED Provider Notes (Signed)
[place] EMERGENCY DEPARTMENT AT [facility] Provider Note   CSN: [id number] Arrival date & time: 12/28/22  1324  An emergency department physician performed an initial assessment on this suspected stroke patient at 0702.  History {Add pertinent medical, surgical, social history, OB history to HPI:1} Chief Complaint  Patient presents with   Code Stroke    Don Brown is a 67 y.o. male.  HPI    67 y.o. male with medical history significant of chronic pancreatitis, pancreatic fistula, hypertension, hyperlipidemia, diabetes mellitus, depression, anxiety, thrombocytopenia, OSA not on CPAP, CHF with EF 20-25%, atrial fibrillation on digoxin (not on anticoagulants), anemia, on prn 2 to 4 L oxygen at home comes to the emergency room with chief complaint of code stroke.  According to EMS, patient was discharged from [facility].  He received Dilaudid.  Upon arrival to the home, patient was found to be stuporous and had a fall.  EMS was called.  When EMS arrived they had to give him Narcan.  After Narcan, patient noted to have some right-sided weakness therefore code stroke was called.  Patient complains of headache.  He recalls being at [facility] and also the fall.  He was unaware that he is at [facility].  He denies any numbness, tingling.  He has slight chest pain, no new abdominal pain and denies any nausea or vision change.  Neurology team at the bedside.  Patient has declined CT angiogram head and neck and also MRI of the brain.   Family is at the bedside.  They confirm the EMS findings.  Home Medications Prior to Admission medications   Medication Sig Start Date End Date Taking? Authorizing Provider  APPLE CIDER VINEGAR PO Take 1 tablet by mouth 3 (three) times daily.    [provider]  aspirin 81 MG tablet Take 81 mg by mouth daily before breakfast.     [provider]  benazepril (LOTENSIN) 10 MG tablet Take 10 mg by  mouth daily. Take 1/2 tablet daily by mouth    [provider]  Carboxymethylcellulose Sod PF 0.5 % SOLN Place 1 drop into both eyes 5 (five) times daily.    [provider]  carvedilol (COREG) 6.25 MG tablet Take 3.125 mg by mouth 2 (two) times daily.    [provider]  Cholecalciferol 2000 units TABS Take 1 tablet by mouth daily.    [provider]  Continuous Blood Gluc Sensor (FREESTYLE LIBRE 2 SENSOR) MISC 1 Device by Does not apply route every 14 (fourteen) days. 02/20/21   Romero Belling, MD  Cyanocobalamin (VITAMIN B 12 PO) Take 1 tablet by mouth daily.     [provider]  digoxin (LANOXIN) 0.125 MG tablet Take 1 tablet (125 mcg total) by mouth every morning. 02/12/14   Rodolph Bong, MD  gabapentin (NEURONTIN) 600 MG tablet Take 600 mg by mouth 3 (three) times daily.    [provider]  HYDROmorphone (DILAUDID) 2 MG tablet Take 2 mg by mouth 4 (four) times daily as needed for severe pain.    [provider]  insulin aspart (NOVOLOG) 100 UNIT/ML injection Inject into the skin 3 (three) times daily before meals. (According to sliding scale)    [provider]  insulin glargine (LANTUS) 100 UNIT/ML injection Inject 40 Units into the skin daily.    [provider]  Misc Natural Products (GINSENG COMPLEX PO) Take 1 tablet by mouth daily.    [provider]  Omega-3 1000 MG CAPS Take 1 capsule by mouth daily.    [provider]  OXcarbazepine (TRILEPTAL) 150 MG tablet Take 150 mg by mouth 2 (two) times daily. Take 2 tablets by mouth 3 times daily for headaches.    [provider]  Pancrelipase, Lip-Prot-Amyl, 24000-76000 units CPEP Take 24,000 Units by mouth 3 (three) times daily.    [provider]  sildenafil (VIAGRA) 100 MG tablet Take 50 mg by mouth daily as needed for erectile dysfunction.    [provider]  triamcinolone cream (KENALOG) 0.1 % Apply 1 Application  topically 2 (two) times daily as needed (rash).    [provider]  TURMERIC PO Take 1 tablet by mouth daily.    [provider]      Allergies    Morphine and codeine    Review of Systems   Review of Systems  All other systems reviewed and are negative.   Physical Exam Updated Vital Signs BP 136/76   Pulse 92   Temp 99.6 F (37.6 C) (Oral)   Resp 20   Wt 134.2 kg   SpO2 98%   BMI 40.13 kg/m  Physical Exam Vitals and nursing note reviewed.  Constitutional:      Appearance: He is well-developed.     Comments: Somnolent, but oriented and follows commands  HENT:     Head: Atraumatic.  Eyes:     Extraocular Movements: Extraocular movements intact.     Pupils: Pupils are equal, round, and reactive to light.     Comments: No nystagmus  Cardiovascular:     Rate and Rhythm: Normal rate.  Pulmonary:     Comments: Patient is tachypneic Musculoskeletal:     Cervical back: Neck supple.  Skin:    General: Skin is warm.  Neurological:     Mental Status: He is oriented to person, place, and time.     Cranial Nerves: No cranial nerve deficit.     Sensory: No sensory deficit.     Comments: Patient noted to be laying in favoring the right side. Gross sensory exam is normal.  Patient is moving all 4 extremities and there is no focal upper extremity or lower extremity weakness.  Patient unable to keep both of his legs up for 5 seconds.     ED Results / Procedures / Treatments   Labs (all labs ordered are listed, but only abnormal results are displayed) Labs Reviewed  CBC - Abnormal; Notable for the following components:      Result Value   Hemoglobin 12.6 (*)    MCHC 29.5 (*)    Platelets 149 (*)    All other components within normal limits  DIFFERENTIAL - Abnormal; Notable for the following components:   Lymphs Abs 0.4 (*)    All other components within normal limits  BRAIN NATRIURETIC PEPTIDE - Abnormal; Notable for the following components:   B  Natriuretic Peptide 235.6 (*)    All other components within normal limits  COMPREHENSIVE METABOLIC PANEL - Abnormal; Notable for the following components:   Glucose, Bld 369 (*)    Calcium 8.5 (*)    Albumin 3.1 (*)    All other components within normal limits  CBG MONITORING, ED - Abnormal; Notable for the following components:   Glucose-Capillary 336 (*)    All other components within normal limits  I-STAT CHEM 8, ED - Abnormal; Notable for the following components:   Potassium 5.2 (*)    Glucose, Bld 383 (*)  Calcium, Ion 1.07 (*)    All other components within normal limits  PROTIME-INR  APTT  ETHANOL  DIGOXIN LEVEL  URINALYSIS, W/ REFLEX TO CULTURE (INFECTION SUSPECTED)  CBG MONITORING, ED  I-STAT VENOUS BLOOD GAS, ED    EKG EKG Interpretation Date/Time:  Monday December 28 2022 09:10:36 EDT Ventricular Rate:  106 PR Interval:    QRS Duration:  106 QT Interval:  308 QTC Calculation: 409 R Axis:   35  Text Interpretation: Atrial fibrillation Nonspecific T abnrm, anterolateral leads No significant change since last tracing Confirmed by Derwood Kaplan [phone]) on 12/28/2022 9:12:00 AM  Radiology DG Pelvis Portable  Result Date: 12/28/2022 CLINICAL DATA:  shob, fall.  Left arm and left leg weakness. EXAM: PORTABLE PELVIS 1-2 VIEWS COMPARISON:  None Available. FINDINGS: Examination is limited due to technique and patient related factors. No acute fracture or dislocation. No aggressive osseous lesion. Visualized sacral arcuate lines are grossly unremarkable. Unremarkable symphysis pubis. There are mild degenerative changes of bilateral hip joints without significant joint space narrowing. Osteophytosis of the superior acetabulum. No radiopaque foreign bodies. IMPRESSION: Limited exam but without gross acute abnormality. Consideration regarding cross-sectional examination should be based on patient's symptomatology and physical examination findings. Electronically Signed   By:  Jules Schick M.D.   On: 12/28/2022 08:14   DG Chest Port 1 View  Result Date: 12/28/2022 CLINICAL DATA:  Shortness of breath.  Fall. EXAM: PORTABLE CHEST 1 VIEW COMPARISON:  07/18/2021. FINDINGS: Low lung volume. Elevated right hemidiaphragm, similar to the prior study. There are bibasilar atelectasis/scarring, essentially similar to the prior study. Redemonstration of linear area of scarring/atelectasis overlying the right mid lung zone. No large pleural effusion seen. Subtle blunting of left lateral costophrenic angle is similar to the prior study and likely secondary to overlying soft tissue/lung parenchymal opacities. Stable cardio-mediastinal silhouette. No acute osseous abnormalities. The soft tissues are within normal limits. IMPRESSION: *Low lung volume with bibasilar atelectasis/scarring. Electronically Signed   By: Jules Schick M.D.   On: 12/28/2022 08:12   CT HEAD CODE STROKE WO CONTRAST  Result Date: 12/28/2022 CLINICAL DATA:  Code stroke.  Acute stroke suspected EXAM: CT HEAD WITHOUT CONTRAST TECHNIQUE: Contiguous axial images were obtained from the base of the skull through the vertex without intravenous contrast. RADIATION DOSE REDUCTION: This exam was performed according to the departmental dose-optimization program which includes automated exposure control, adjustment of the mA and/or kV according to patient size and/or use of iterative reconstruction technique. COMPARISON:  04/30/2013 FINDINGS: Brain: No evidence of acute infarction, hemorrhage, hydrocephalus, extra-axial collection or mass lesion/mass effect. Diffusely suboptimal gray-white differentiation attributed to hyperostosis. 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 Vascular: No hyperdense vessel or unexpected calcification. Tortuous appearance of intracranial vessels. Skull: Normal. Negative for fracture or focal lesion. Sinuses/Orbits: No acute finding. Other: Prelim sent in epic chat. ASPECTS [facility] Stroke Program  Early CT Score) -on both sides, deficit not known - Ganglionic level infarction (caudate, lentiform nuclei, internal capsule, insula, M1-M3 cortex): 7 - Supraganglionic infarction (M4-M6 cortex): 3 Total score (0-10 with 10 being normal): 10 IMPRESSION: 1. No acute finding. 2. 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 Electronically Signed   By: Tiburcio Pea M.D.   On: 12/28/2022 07:26   CT ABDOMEN PELVIS WO CONTRAST  Result Date: 12/27/2022 CLINICAL DATA:  Epigastric pain, query recurrent pancreatitis. Nausea and vomiting. Patient declined IV contrast. EXAM: CT ABDOMEN AND PELVIS WITHOUT CONTRAST TECHNIQUE: Multidetector CT imaging of the abdomen and pelvis was performed  following the standard protocol without IV contrast. RADIATION DOSE REDUCTION: This exam was performed according to the departmental dose-optimization program which includes automated exposure control, adjustment of the mA and/or kV according to patient size and/or use of iterative reconstruction technique. COMPARISON:  CT abdomen and pelvis 12/26/2021 FINDINGS: Lower chest: Bibasilar atelectasis.  No acute abnormality. Hepatobiliary: Hepatic cysts. Gallbladder is unremarkable. No biliary dilation. Pancreas: Pancreatic calcifications, pancreatic ductal dilation, pancreatic atrophy is redemonstrated. Scattered cystic pancreatic lesions are again seen. The largest is slightly increased in size measuring 2.1 cm, previously 1.6 cm in the posterior body of the pancreas on series 2/image 41. No peripancreatic stranding to suggest acute inflammation. Spleen: Unremarkable. Adrenals/Urinary Tract: Stable adrenal glands and kidneys. No urinary calculi or hydronephrosis. Unremarkable bladder. Stomach/Bowel: Normal caliber large and small bowel. Normal appendix. Stomach is within normal limits. Vascular/Lymphatic: Aortic atherosclerosis. No enlarged abdominal or pelvic lymph nodes. Reproductive: Prostatomegaly. Other: No free intraperitoneal fluid or  air. Similar laxity of the ventral abdominal wall. Musculoskeletal: No acute fracture. IMPRESSION: 1. Findings of chronic pancreatitis. No evidence of acute pancreatitis. 2. Scattered cystic pancreatic lesions are again seen. The largest is slightly increased in size measuring 2.1 cm, previously 1.6 cm in the posterior body of the pancreas. These may represent side branch IPMN or pseudocysts. Consider follow-up pancreatic protocol MRI/MRCP with and without contrast in 12 months. 3. Prostatomegaly. Aortic Atherosclerosis (ICD10-I70.0). Electronically Signed   By: Minerva Fester M.D.   On: 12/27/2022 21:59    Procedures .Critical Care  Performed by: Derwood Kaplan, MD Authorized by: Derwood Kaplan, MD   Critical care provider statement:    Critical care time (minutes):  52   Critical care was necessary to treat or prevent imminent or life-threatening deterioration of the following conditions:  Respiratory failure   Critical care was time spent personally by me on the following activities:  Development of treatment plan with patient or surrogate, discussions with consultants, evaluation of patient's response to treatment, examination of patient, ordering and review of laboratory studies, ordering and review of radiographic studies, ordering and performing treatments and interventions, pulse oximetry, re-evaluation of patient's condition and review of old charts   {Document cardiac monitor, telemetry assessment procedure when appropriate:1}  Medications Ordered in ED Medications  sodium chloride flush (NS) 0.9 % injection 3 mL (has no administration in time range)  ketorolac (TORADOL) 15 MG/ML injection 10 mg (10 mg Intravenous Given 12/28/22 0913)  metoCLOPramide (REGLAN) injection 10 mg (10 mg Intravenous Given 12/28/22 0913)    ED Course/ Medical Decision Making/ A&P   {   Click here for ABCD2, HEART and other calculatorsREFRESH Note before signing :1}                              Medical  Decision Making Amount and/or Complexity of Data Reviewed Labs: ordered. Radiology: ordered.  Risk Prescription drug management. Decision regarding hospitalization.  This patient presents to the ED with chief complaint(s) of code stroke, generalized weakness, unresponsiveness with pertinent past medical history of advanced CHF with EF of 25%, A-fib, digoxin use but not on any anticoagulation, chronic hypoxic respiratory failure, morbid obesity and metabolic syndrome.The complaint involves an extensive differential diagnosis and also carries with it a high risk of complications and morbidity.    The differential diagnosis includes : Acute ischemic stroke, acute hemorrhagic stroke, acute embolic stroke, intracranial hemorrhage -including subarachnoid, altered mental status because of opiate use, obesity hypoventilation syndrome, hypercapnic  respiratory failure, metabolic disorder including renal failure, electrolyte abnormality, pulmonary embolism, CHF exacerbation  Patient arrived as code stroke. Neurology team assessed the patient immediately.  There were some concerns about right-sided weakness.  Patient has no focal deficits that are significant, but he does have several risk factors for stroke including A-fib for which she is not any anticoagulation.  Patient not a TNK candidate for neurology. However, they wanted CT angiogram and MRI, but patient has declined.  Patient declines those studies because he " does not like contrast and does not like MRI".    Additional history obtained: Additional history obtained from family and EMS  Records reviewed previous admission documents  Independent labs interpretation:  The following labs were independently interpreted: Patient's initial labs are reassuring.  He just had labs drawn few hours ago, they are unchanged.  We are adding BNP and digoxin level to his initial labs.  Venous blood gas was ordered to ensure there is no hypercapnic respiratory  failure.  Patient states that he does not use CPAP but has OSA.  Independent visualization and interpretation of imaging: - I independently visualized the following imaging with scope of interpretation limited to determining acute life threatening conditions related to emergency care: CT scan of the brain, which revealed no evidence of brain bleed  Treatment and Reassessment: Patient reassessed on multiple occasions.  He is having headaches.  He states that the headaches   Consultation: - Consulted or discussed management/test interpretation with external professional: Neurology, who has indicated that patient is not a TNK candidate.   Patient has declined any CTs with contrast and also MRI of the brain against medical advice. Patient understands that his actions will lead to inadequate medical workup, and that he is at risk of complications of missed diagnosis, which includes morbidity and mortality.  Alternative options discussed -given medications to calm him down, sedate him. Opportunity to change mind given. Discussion witnessed by RN, neurologist Patient is demonstrating good capacity to make decision. Discussed with family to see if they can help patient changes mind.  Final Clinical Impression(s) / ED Diagnoses Final diagnoses:  Severe headache  Generalized weakness  Pancreatic cyst  Chronic hypoxic respiratory failure (HCC)    Rx / DC Orders ED Discharge Orders     None

## 2022-12-28 NOTE — Evaluation (Signed)
Clinical/Bedside Swallow Evaluation Patient Details  Name: Don Brown MRN: [id number] Date of Birth: 1956-03-20  Today's Date: 12/28/2022 Time: SLP Start Time (ACUTE ONLY): 1709 SLP Stop Time (ACUTE ONLY): 1718 SLP Time Calculation (min) (ACUTE ONLY): 9 min  Past Medical History:  Past Medical History:  Diagnosis Date   Anemia    Anxiety    Atrial fibrillation (HCC)    Cervical compression fracture (HCC)    Cervical disk disease requiring surgery   CHF (congestive heart failure) (HCC)    Chronic pain    Chronic systolic heart failure (HCC)    LVEF 40-45%   Degenerative joint disease    Depression    Essential hypertension, benign    Headache    "stress"   Heart murmur    History of blood transfusion 2007   "related to OR"   History of renal failure    Requiring hemodialysis in the past with normal renal function at this point   History of ventral hernia repair    Hyperlipidemia    MGUS (monoclonal gammopathy of unknown significance)    "or myelona" (05/07/2014)   Mitral valve prolapse    Morbid obesity (HCC)    Myeloma (HCC)    "or MGUS" (05/07/2014)   Narcotic dependence, episodic use (HCC)    On home oxygen therapy    "2L prn" (05/07/2014)   Pancreatitis    Severe pancreatitis status post surgery with 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 status post multiple endoscopic drainage procedures and stents    Respiratory failure (HCC)    History of ventilatory-dependent respiratory failure and tracheostomy    Sleep apnea    "don't wear mask" (05/07/2014)   Type 2 diabetes mellitus (HCC)    Past Surgical History:  Past Surgical History:  Procedure Laterality Date   BONE MARROW BIOPSY  02/2014   CATARACT EXTRACTION W/ INTRAOCULAR LENS  IMPLANT, BILATERAL Bilateral    CHOLECYSTECTOMY     HERNIA REPAIR     INSERTION OF MESH  10/2006   IR RADIOLOGIST EVAL & MGMT  08/18/2017   PANCREAS SURGERY     "placed stents; multiple pseudo cysts removal"   SKIN GRAFT      TRACHEOSTOMY  2007   VENTRAL HERNIA REPAIR     HPI:  Don Brown is a 67 yo male presenting to ED 10/7 from home after family found him stuporous and obtunded followed by a fall. Seen 10/6 at [facility] for chronic pancreatitis and was d/c. CTH negative. Pt seen for OP MBS in 2015 with WFL oropharyngeal swallowing function. PMH includes A-fib, degenerative disc disease, MGUS, HLD, morbid obesity, chronic pancreatitis, CHF, OSA, T2DM, chronic respiratory failure on 2-4L [place]    Assessment / Plan / Recommendation  Clinical Impression  Pt reports no difficulty with swallowing PTA. Oral motor exam WFL. Observed pt with trials of challening sips of thin liquids via straw and solids with no overt s/s of dypshagia or aspiration. Pt's level of alertness has improved significantly per RN. Recommend regular texture diet with thin liquids. No further SLP f/u clinically indicated for swallowing at this time. SLP Visit Diagnosis: Dysphagia, unspecified (R13.10)    Aspiration Risk  Mild aspiration risk    Diet Recommendation Regular;Thin liquid    Liquid Administration via: Cup;Straw Medication Administration: Whole meds with liquid Supervision: Patient able to self feed Compensations: Slow rate;Small sips/bites Postural Changes: Seated upright at 90 degrees    Other  Recommendations Oral Care Recommendations: Oral care BID  Recommendations for follow up therapy are one component of a multi-disciplinary discharge planning process, led by the attending physician.  Recommendations may be updated based on patient status, additional functional criteria and insurance authorization.  Follow up Recommendations No SLP follow up      Assistance Recommended at Discharge    Functional Status Assessment Patient has not had a recent decline in their functional status  Frequency and Duration            Prognosis Prognosis for improved oropharyngeal function: Good      Swallow Study   General HPI: Don Brown is a 67 yo male presenting to ED 10/7 from home after family found him stuporous and obtunded followed by a fall. Seen 10/6 at [facility] for chronic pancreatitis and was d/c. CTH negative. Pt seen for OP MBS in 2015 with WFL oropharyngeal swallowing function. PMH includes A-fib, degenerative disc disease, MGUS, HLD, morbid obesity, chronic pancreatitis, CHF, OSA, T2DM, chronic respiratory failure on 2-4L [place] Type of Study: Bedside Swallow Evaluation Previous Swallow Assessment: see HPI Diet Prior to this Study: NPO Temperature Spikes Noted: No Respiratory Status: Nasal cannula History of Recent Intubation: No Behavior/Cognition: Alert;Cooperative Oral Cavity Assessment: Within Functional Limits Oral Care Completed by SLP: No Oral Cavity - Dentition: Adequate natural dentition Vision: Functional for self-feeding Self-Feeding Abilities: Able to feed self Patient Positioning: Upright in bed Baseline Vocal Quality: Normal Volitional Cough: Strong Volitional Swallow: Able to elicit    Oral/Motor/Sensory Function Overall Oral Motor/Sensory Function: Within functional limits   Ice Chips Ice chips: Not tested   Thin Liquid Thin Liquid: Within functional limits Presentation: Straw;Self Fed    Nectar Thick Nectar Thick Liquid: Not tested   Honey Thick Honey Thick Liquid: Not tested   Puree Puree: Not tested   Solid     Solid: Within functional limits Presentation: Self Fed      Don Brown, M.A., CF-SLP Speech Language Pathology, Acute Rehabilitation Services  Secure Chat preferred [phone]  12/28/2022,5:25 PM

## 2022-12-29 DIAGNOSIS — T402X1A Poisoning by other opioids, accidental (unintentional), initial encounter: Secondary | ICD-10-CM | POA: Diagnosis not present

## 2022-12-29 DIAGNOSIS — R531 Weakness: Secondary | ICD-10-CM | POA: Diagnosis not present

## 2022-12-29 LAB — COMPREHENSIVE METABOLIC PANEL
ALT: 18 U/L (ref 0–44)
AST: 13 U/L — ABNORMAL LOW (ref 15–41)
Albumin: 3.1 g/dL — ABNORMAL LOW (ref 3.5–5.0)
Alkaline Phosphatase: 58 U/L (ref 38–126)
Anion gap: 7 (ref 5–15)
BUN: 18 mg/dL (ref 8–23)
CO2: 30 mmol/L (ref 22–32)
Calcium: 8.3 mg/dL — ABNORMAL LOW (ref 8.9–10.3)
Chloride: 99 mmol/L (ref 98–111)
Creatinine, Ser: 1.1 mg/dL (ref 0.61–1.24)
GFR, Estimated: >60 mL/min (ref >60)
Glucose, Bld: 239 mg/dL — ABNORMAL HIGH (ref 70–99)
Potassium: 4.2 mmol/L (ref 3.5–5.1)
Sodium: 136 mmol/L (ref 135–145)
Total Bilirubin: 0.8 mg/dL (ref 0.3–1.2)
Total Protein: 8.3 g/dL — ABNORMAL HIGH (ref 6.5–8.1)

## 2022-12-29 LAB — GLUCOSE, CAPILLARY
Glucose-Capillary: 230 mg/dL — ABNORMAL HIGH (ref 70–99)
Glucose-Capillary: 183 mg/dL — ABNORMAL HIGH (ref 70–99)
Glucose-Capillary: 311 mg/dL — ABNORMAL HIGH (ref 70–99)

## 2022-12-29 LAB — CBC
HCT: 46 % (ref 39.0–52.0)
Hemoglobin: 13.6 g/dL (ref 13.0–17.0)
MCH: 26.8 pg (ref 26.0–34.0)
MCHC: 29.6 g/dL — ABNORMAL LOW (ref 30.0–36.0)
MCV: 90.6 fL (ref 80.0–100.0)
Platelets: 127 10*3/uL — ABNORMAL LOW (ref 150–400)
RBC: 5.08 MIL/uL (ref 4.22–5.81)
RDW: 13.7 % (ref 11.5–15.5)
WBC: 6.5 10*3/uL (ref 4.0–10.5)
nRBC: 0 % (ref 0.0–0.2)

## 2022-12-29 LAB — VITAMIN B12: Vitamin B-12: 2109 pg/mL — ABNORMAL HIGH (ref 180–914)

## 2022-12-29 MED ORDER — ENOXAPARIN SODIUM 60 MG/0.6ML IJ SOSY
60.0000 mg | PREFILLED_SYRINGE | INTRAMUSCULAR | Status: DC
  Filled 2022-12-29: qty 0.6

## 2022-12-29 MED ORDER — GABAPENTIN 600 MG PO TABS: 300.0000 mg | ORAL_TABLET | Freq: Three times a day (TID) | ORAL | Status: AC

## 2022-12-29 MED ORDER — PANCRELIPASE (LIP-PROT-AMYL) 12000-38000 UNITS PO CPEP
24000.0000 [IU] | ORAL_CAPSULE | Freq: Three times a day (TID) | ORAL | Status: DC
  Filled 2022-12-29 (×2): qty 2

## 2022-12-29 NOTE — Plan of Care (Signed)

## 2022-12-29 NOTE — Discharge Summary (Cosign Needed Addendum)
Reflex Scores:      Patellar reflexes are 0 on the right side and 0 on the left side.    Comments: Exam limited due to patient participation. Chronic weakness and lower extremity neuropathy   Psychiatric:        Behavior: Behavior is uncooperative.      Pertinent Labs, Studies, and Procedures:     Latest Ref Rng & Units 12/29/2022    5:19 AM 12/28/2022    9:44 AM 12/28/2022    7:17 AM  CBC  WBC 4.0 - 10.5 K/uL 6.5     Hemoglobin 13.0 - 17.0 g/dL 96.0  45.4  09.8   Hematocrit 39.0 - 52.0 % 46.0  42.0  44.0   Platelets 150 - 400 K/uL 127          Latest Ref Rng & Units 12/29/2022    5:19 AM 12/28/2022    9:44 AM 12/28/2022    8:00 AM  CMP  Glucose 70 - 99 mg/dL 119   147   BUN 8 - 23 mg/dL 18   15   Creatinine 8.29 - 1.24 mg/dL 5.62   1.30   Sodium 865 - 145 mmol/L 136  141  136   Potassium 3.5 - 5.1 mmol/L 4.2  4.2  4.4   Chloride 98 - 111 mmol/L 99   100   CO2 22 - 32 mmol/L 30   31   Calcium 8.9 - 10.3 mg/dL 8.3   8.5   Total Protein 6.5 - 8.1 g/dL 8.3   7.8   Total Bilirubin 0.3 - 1.2 mg/dL 0.8   1.0   Alkaline Phos 38 - 126 U/L 58   58   AST 15 - 41 U/L 13   17   ALT 0 - 44 U/L 18   19     VAS [place] LOWER EXTREMITY VENOUS (DVT) (7a-7p)  Result Date: 12/28/2022  Lower Venous DVT Study Patient Name:  Don Brown  Date of Exam:   12/28/2022 Medical Rec #: [id number]          Accession #:    [id number] Date of Birth: 26-Jan-1956          Patient Gender: M Patient Age:   67 years Exam Location:  [facility] Procedure:      VAS [place] LOWER EXTREMITY VENOUS (DVT) Referring Phys: Janey Genta NANAVATI --------------------------------------------------------------------------------  Indications: SOB.  Limitations: Poor ultrasound/tissue interface and patient would not remove pants,  limited visualization of screen secondary to bright light that could not be turned off. Comparison Study: No prior study on file Performing Technologist: Sherren Kerns RVS  Examination Guidelines: A complete evaluation includes B-mode imaging, spectral Doppler, color Doppler, and power Doppler as needed of all accessible portions of each vessel. Bilateral testing is considered an integral part of a complete examination. Limited examinations for reoccurring indications may be performed as noted. The reflux portion of the exam is performed with the patient in reverse Trendelenburg.  +---------+---------------+---------+-----------+----------+-------------------+ RIGHT    CompressibilityPhasicitySpontaneityPropertiesThrombus Aging      +---------+---------------+---------+-----------+----------+-------------------+ CFV                     Yes      Yes                  patent by color and  Reflex Scores:      Patellar reflexes are 0 on the right side and 0 on the left side.    Comments: Exam limited due to patient participation. Chronic weakness and lower extremity neuropathy   Psychiatric:        Behavior: Behavior is uncooperative.      Pertinent Labs, Studies, and Procedures:     Latest Ref Rng & Units 12/29/2022    5:19 AM 12/28/2022    9:44 AM 12/28/2022    7:17 AM  CBC  WBC 4.0 - 10.5 K/uL 6.5     Hemoglobin 13.0 - 17.0 g/dL 96.0  45.4  09.8   Hematocrit 39.0 - 52.0 % 46.0  42.0  44.0   Platelets 150 - 400 K/uL 127          Latest Ref Rng & Units 12/29/2022    5:19 AM 12/28/2022    9:44 AM 12/28/2022    8:00 AM  CMP  Glucose 70 - 99 mg/dL 119   147   BUN 8 - 23 mg/dL 18   15   Creatinine 8.29 - 1.24 mg/dL 5.62   1.30   Sodium 865 - 145 mmol/L 136  141  136   Potassium 3.5 - 5.1 mmol/L 4.2  4.2  4.4   Chloride 98 - 111 mmol/L 99   100   CO2 22 - 32 mmol/L 30   31   Calcium 8.9 - 10.3 mg/dL 8.3   8.5   Total Protein 6.5 - 8.1 g/dL 8.3   7.8   Total Bilirubin 0.3 - 1.2 mg/dL 0.8   1.0   Alkaline Phos 38 - 126 U/L 58   58   AST 15 - 41 U/L 13   17   ALT 0 - 44 U/L 18   19     VAS [place] LOWER EXTREMITY VENOUS (DVT) (7a-7p)  Result Date: 12/28/2022  Lower Venous DVT Study Patient Name:  Don Brown  Date of Exam:   12/28/2022 Medical Rec #: [id number]          Accession #:    [id number] Date of Birth: 26-Jan-1956          Patient Gender: M Patient Age:   67 years Exam Location:  [facility] Procedure:      VAS [place] LOWER EXTREMITY VENOUS (DVT) Referring Phys: Janey Genta NANAVATI --------------------------------------------------------------------------------  Indications: SOB.  Limitations: Poor ultrasound/tissue interface and patient would not remove pants,  limited visualization of screen secondary to bright light that could not be turned off. Comparison Study: No prior study on file Performing Technologist: Sherren Kerns RVS  Examination Guidelines: A complete evaluation includes B-mode imaging, spectral Doppler, color Doppler, and power Doppler as needed of all accessible portions of each vessel. Bilateral testing is considered an integral part of a complete examination. Limited examinations for reoccurring indications may be performed as noted. The reflux portion of the exam is performed with the patient in reverse Trendelenburg.  +---------+---------------+---------+-----------+----------+-------------------+ RIGHT    CompressibilityPhasicitySpontaneityPropertiesThrombus Aging      +---------+---------------+---------+-----------+----------+-------------------+ CFV                     Yes      Yes                  patent by color and  Name: Don Brown MRN: [id number] DOB: 1955/07/17 67 y.o. PCP: Sherwood Gambler, MD  Date of Admission: 12/28/2022  7:06 AM Date of Discharge:  12/29/2022  Attending Physician: Dr. Antony Contras  DISCHARGE DIAGNOSIS:  Primary Problem: Opioid overdose [facility])   Hospital Problems: Principal Problem:   Opioid overdose [facility]) Active Problems:   Chronic atrial fibrillation (HCC)   Acute on chronic pancreatitis (HCC)   Weakness generalized    DISCHARGE MEDICATIONS:   Allergies as of 12/29/2022       Reactions   Morphine And Codeine Nausea And Vomiting        Medication List     STOP taking these medications    HYDROmorphone 2 MG tablet Commonly known as: DILAUDID       TAKE these medications    APPLE CIDER VINEGAR PO Take 1 tablet by mouth 3 (three) times daily.   aspirin 81 MG tablet Take 81 mg by mouth daily before breakfast.   benazepril 10 MG tablet Commonly known as: LOTENSIN Take 10 mg by mouth daily. Take 1/2 tablet daily by mouth   Carboxymethylcellulose Sod PF 0.5 % Soln Place 1 drop into both eyes 5 (five) times daily.   carvedilol 6.25 MG tablet Commonly known as: COREG Take 3.125 mg by mouth 2 (two) times daily.   Cholecalciferol 50 MCG (2000 UT) Tabs Take 1 tablet by mouth daily.   digoxin 0.125 MG tablet Commonly known as: LANOXIN Take 1 tablet (125 mcg total) by mouth every morning.   FreeStyle Libre 2 Sensor Misc 1 Device by Does not apply route every 14 (fourteen) days.   furosemide 80 MG tablet Commonly known as: LASIX Take 80 mg by mouth daily.   gabapentin 600 MG tablet Commonly known as: NEURONTIN Take 0.5 tablets (300 mg total) by mouth 3 (three) times daily. What changed: how much to take   GINSENG COMPLEX PO Take 1 tablet by mouth daily.   insulin aspart 100 UNIT/ML injection Commonly known as: novoLOG Inject into the skin 3 (three) times daily before meals. (According to sliding scale)   insulin glargine 100  UNIT/ML injection Commonly known as: LANTUS Inject 40 Units into the skin daily.   Omega-3 1000 MG Caps Take 1 capsule by mouth daily.   OXcarbazepine 150 MG tablet Commonly known as: TRILEPTAL Take 150 mg by mouth 2 (two) times daily. Take 2 tablets by mouth 3 times daily for headaches.   Pancrelipase (Lip-Prot-Amyl) 24000-76000 units Cpep Take 24,000 Units by mouth 3 (three) times daily.   sildenafil 100 MG tablet Commonly known as: VIAGRA Take 50 mg by mouth daily as needed for erectile dysfunction.   triamcinolone cream 0.1 % Commonly known as: KENALOG Apply 1 Application topically 2 (two) times daily as needed (rash).   TURMERIC PO Take 1 tablet by mouth daily.   VITAMIN B 12 PO Take 1 tablet by mouth daily.        DISPOSITION AND FOLLOW-UP:  Mr.Don Brown was discharged from [facility] in stable condition. At the hospital follow up visit please address:  Follow-up Recommendations: Consults: [facility] or [facility] for symptoms  Labs:  -Suggest work up with SPEP, UPEP, IFE, and serum free light chains to rule out a monoclonal gammopathy.  Studies:  - Radiology recommended pancreatic protocol MRI/MRCP with and without contrast in 12 months to f/u IPMN. Medications: Discuss starting additional blood pressure, statin and anticoagulation with primary care physician or specialist   Follow-up  Name: Don Brown MRN: [id number] DOB: 1955/07/17 67 y.o. PCP: Sherwood Gambler, MD  Date of Admission: 12/28/2022  7:06 AM Date of Discharge:  12/29/2022  Attending Physician: Dr. Antony Contras  DISCHARGE DIAGNOSIS:  Primary Problem: Opioid overdose [facility])   Hospital Problems: Principal Problem:   Opioid overdose [facility]) Active Problems:   Chronic atrial fibrillation (HCC)   Acute on chronic pancreatitis (HCC)   Weakness generalized    DISCHARGE MEDICATIONS:   Allergies as of 12/29/2022       Reactions   Morphine And Codeine Nausea And Vomiting        Medication List     STOP taking these medications    HYDROmorphone 2 MG tablet Commonly known as: DILAUDID       TAKE these medications    APPLE CIDER VINEGAR PO Take 1 tablet by mouth 3 (three) times daily.   aspirin 81 MG tablet Take 81 mg by mouth daily before breakfast.   benazepril 10 MG tablet Commonly known as: LOTENSIN Take 10 mg by mouth daily. Take 1/2 tablet daily by mouth   Carboxymethylcellulose Sod PF 0.5 % Soln Place 1 drop into both eyes 5 (five) times daily.   carvedilol 6.25 MG tablet Commonly known as: COREG Take 3.125 mg by mouth 2 (two) times daily.   Cholecalciferol 50 MCG (2000 UT) Tabs Take 1 tablet by mouth daily.   digoxin 0.125 MG tablet Commonly known as: LANOXIN Take 1 tablet (125 mcg total) by mouth every morning.   FreeStyle Libre 2 Sensor Misc 1 Device by Does not apply route every 14 (fourteen) days.   furosemide 80 MG tablet Commonly known as: LASIX Take 80 mg by mouth daily.   gabapentin 600 MG tablet Commonly known as: NEURONTIN Take 0.5 tablets (300 mg total) by mouth 3 (three) times daily. What changed: how much to take   GINSENG COMPLEX PO Take 1 tablet by mouth daily.   insulin aspart 100 UNIT/ML injection Commonly known as: novoLOG Inject into the skin 3 (three) times daily before meals. (According to sliding scale)   insulin glargine 100  UNIT/ML injection Commonly known as: LANTUS Inject 40 Units into the skin daily.   Omega-3 1000 MG Caps Take 1 capsule by mouth daily.   OXcarbazepine 150 MG tablet Commonly known as: TRILEPTAL Take 150 mg by mouth 2 (two) times daily. Take 2 tablets by mouth 3 times daily for headaches.   Pancrelipase (Lip-Prot-Amyl) 24000-76000 units Cpep Take 24,000 Units by mouth 3 (three) times daily.   sildenafil 100 MG tablet Commonly known as: VIAGRA Take 50 mg by mouth daily as needed for erectile dysfunction.   triamcinolone cream 0.1 % Commonly known as: KENALOG Apply 1 Application topically 2 (two) times daily as needed (rash).   TURMERIC PO Take 1 tablet by mouth daily.   VITAMIN B 12 PO Take 1 tablet by mouth daily.        DISPOSITION AND FOLLOW-UP:  Mr.Don Brown was discharged from [facility] in stable condition. At the hospital follow up visit please address:  Follow-up Recommendations: Consults: [facility] for symptoms  Labs:  -Suggest work up with SPEP, UPEP, IFE, and serum free light chains to rule out a monoclonal gammopathy.  Studies:  - Radiology recommended pancreatic protocol MRI/MRCP with and without contrast in 12 months to f/u IPMN. Medications: Discuss starting additional blood pressure, statin and anticoagulation with primary care physician or specialist   Follow-up  Name: Don Brown MRN: [id number] DOB: 1955/07/17 67 y.o. PCP: Sherwood Gambler, MD  Date of Admission: 12/28/2022  7:06 AM Date of Discharge:  12/29/2022  Attending Physician: Dr. Antony Contras  DISCHARGE DIAGNOSIS:  Primary Problem: Opioid overdose [facility])   Hospital Problems: Principal Problem:   Opioid overdose [facility]) Active Problems:   Chronic atrial fibrillation (HCC)   Acute on chronic pancreatitis (HCC)   Weakness generalized    DISCHARGE MEDICATIONS:   Allergies as of 12/29/2022       Reactions   Morphine And Codeine Nausea And Vomiting        Medication List     STOP taking these medications    HYDROmorphone 2 MG tablet Commonly known as: DILAUDID       TAKE these medications    APPLE CIDER VINEGAR PO Take 1 tablet by mouth 3 (three) times daily.   aspirin 81 MG tablet Take 81 mg by mouth daily before breakfast.   benazepril 10 MG tablet Commonly known as: LOTENSIN Take 10 mg by mouth daily. Take 1/2 tablet daily by mouth   Carboxymethylcellulose Sod PF 0.5 % Soln Place 1 drop into both eyes 5 (five) times daily.   carvedilol 6.25 MG tablet Commonly known as: COREG Take 3.125 mg by mouth 2 (two) times daily.   Cholecalciferol 50 MCG (2000 UT) Tabs Take 1 tablet by mouth daily.   digoxin 0.125 MG tablet Commonly known as: LANOXIN Take 1 tablet (125 mcg total) by mouth every morning.   FreeStyle Libre 2 Sensor Misc 1 Device by Does not apply route every 14 (fourteen) days.   furosemide 80 MG tablet Commonly known as: LASIX Take 80 mg by mouth daily.   gabapentin 600 MG tablet Commonly known as: NEURONTIN Take 0.5 tablets (300 mg total) by mouth 3 (three) times daily. What changed: how much to take   GINSENG COMPLEX PO Take 1 tablet by mouth daily.   insulin aspart 100 UNIT/ML injection Commonly known as: novoLOG Inject into the skin 3 (three) times daily before meals. (According to sliding scale)   insulin glargine 100  UNIT/ML injection Commonly known as: LANTUS Inject 40 Units into the skin daily.   Omega-3 1000 MG Caps Take 1 capsule by mouth daily.   OXcarbazepine 150 MG tablet Commonly known as: TRILEPTAL Take 150 mg by mouth 2 (two) times daily. Take 2 tablets by mouth 3 times daily for headaches.   Pancrelipase (Lip-Prot-Amyl) 24000-76000 units Cpep Take 24,000 Units by mouth 3 (three) times daily.   sildenafil 100 MG tablet Commonly known as: VIAGRA Take 50 mg by mouth daily as needed for erectile dysfunction.   triamcinolone cream 0.1 % Commonly known as: KENALOG Apply 1 Application topically 2 (two) times daily as needed (rash).   TURMERIC PO Take 1 tablet by mouth daily.   VITAMIN B 12 PO Take 1 tablet by mouth daily.        DISPOSITION AND FOLLOW-UP:  Mr.Don Brown was discharged from [facility] in stable condition. At the hospital follow up visit please address:  Follow-up Recommendations: Consults: [facility] for symptoms  Labs:  -Suggest work up with SPEP, UPEP, IFE, and serum free light chains to rule out a monoclonal gammopathy.  Studies:  - Radiology recommended pancreatic protocol MRI/MRCP with and without contrast in 12 months to f/u IPMN. Medications: Discuss starting additional blood pressure, statin and anticoagulation with primary care physician or specialist   Follow-up  Reflex Scores:      Patellar reflexes are 0 on the right side and 0 on the left side.    Comments: Exam limited due to patient participation. Chronic weakness and lower extremity neuropathy   Psychiatric:        Behavior: Behavior is uncooperative.      Pertinent Labs, Studies, and Procedures:     Latest Ref Rng & Units 12/29/2022    5:19 AM 12/28/2022    9:44 AM 12/28/2022    7:17 AM  CBC  WBC 4.0 - 10.5 K/uL 6.5     Hemoglobin 13.0 - 17.0 g/dL 96.0  45.4  09.8   Hematocrit 39.0 - 52.0 % 46.0  42.0  44.0   Platelets 150 - 400 K/uL 127          Latest Ref Rng & Units 12/29/2022    5:19 AM 12/28/2022    9:44 AM 12/28/2022    8:00 AM  CMP  Glucose 70 - 99 mg/dL 119   147   BUN 8 - 23 mg/dL 18   15   Creatinine 8.29 - 1.24 mg/dL 5.62   1.30   Sodium 865 - 145 mmol/L 136  141  136   Potassium 3.5 - 5.1 mmol/L 4.2  4.2  4.4   Chloride 98 - 111 mmol/L 99   100   CO2 22 - 32 mmol/L 30   31   Calcium 8.9 - 10.3 mg/dL 8.3   8.5   Total Protein 6.5 - 8.1 g/dL 8.3   7.8   Total Bilirubin 0.3 - 1.2 mg/dL 0.8   1.0   Alkaline Phos 38 - 126 U/L 58   58   AST 15 - 41 U/L 13   17   ALT 0 - 44 U/L 18   19     VAS [place] LOWER EXTREMITY VENOUS (DVT) (7a-7p)  Result Date: 12/28/2022  Lower Venous DVT Study Patient Name:  Don Brown  Date of Exam:   12/28/2022 Medical Rec #: [id number]          Accession #:    [id number] Date of Birth: 26-Jan-1956          Patient Gender: M Patient Age:   67 years Exam Location:  [facility] Procedure:      VAS [place] LOWER EXTREMITY VENOUS (DVT) Referring Phys: Janey Genta NANAVATI --------------------------------------------------------------------------------  Indications: SOB.  Limitations: Poor ultrasound/tissue interface and patient would not remove pants,  limited visualization of screen secondary to bright light that could not be turned off. Comparison Study: No prior study on file Performing Technologist: Sherren Kerns RVS  Examination Guidelines: A complete evaluation includes B-mode imaging, spectral Doppler, color Doppler, and power Doppler as needed of all accessible portions of each vessel. Bilateral testing is considered an integral part of a complete examination. Limited examinations for reoccurring indications may be performed as noted. The reflux portion of the exam is performed with the patient in reverse Trendelenburg.  +---------+---------------+---------+-----------+----------+-------------------+ RIGHT    CompressibilityPhasicitySpontaneityPropertiesThrombus Aging      +---------+---------------+---------+-----------+----------+-------------------+ CFV                     Yes      Yes                  patent by color and  Reflex Scores:      Patellar reflexes are 0 on the right side and 0 on the left side.    Comments: Exam limited due to patient participation. Chronic weakness and lower extremity neuropathy   Psychiatric:        Behavior: Behavior is uncooperative.      Pertinent Labs, Studies, and Procedures:     Latest Ref Rng & Units 12/29/2022    5:19 AM 12/28/2022    9:44 AM 12/28/2022    7:17 AM  CBC  WBC 4.0 - 10.5 K/uL 6.5     Hemoglobin 13.0 - 17.0 g/dL 96.0  45.4  09.8   Hematocrit 39.0 - 52.0 % 46.0  42.0  44.0   Platelets 150 - 400 K/uL 127          Latest Ref Rng & Units 12/29/2022    5:19 AM 12/28/2022    9:44 AM 12/28/2022    8:00 AM  CMP  Glucose 70 - 99 mg/dL 119   147   BUN 8 - 23 mg/dL 18   15   Creatinine 8.29 - 1.24 mg/dL 5.62   1.30   Sodium 865 - 145 mmol/L 136  141  136   Potassium 3.5 - 5.1 mmol/L 4.2  4.2  4.4   Chloride 98 - 111 mmol/L 99   100   CO2 22 - 32 mmol/L 30   31   Calcium 8.9 - 10.3 mg/dL 8.3   8.5   Total Protein 6.5 - 8.1 g/dL 8.3   7.8   Total Bilirubin 0.3 - 1.2 mg/dL 0.8   1.0   Alkaline Phos 38 - 126 U/L 58   58   AST 15 - 41 U/L 13   17   ALT 0 - 44 U/L 18   19     VAS [place] LOWER EXTREMITY VENOUS (DVT) (7a-7p)  Result Date: 12/28/2022  Lower Venous DVT Study Patient Name:  Don Brown  Date of Exam:   12/28/2022 Medical Rec #: [id number]          Accession #:    [id number] Date of Birth: 26-Jan-1956          Patient Gender: M Patient Age:   67 years Exam Location:  [facility] Procedure:      VAS [place] LOWER EXTREMITY VENOUS (DVT) Referring Phys: Janey Genta NANAVATI --------------------------------------------------------------------------------  Indications: SOB.  Limitations: Poor ultrasound/tissue interface and patient would not remove pants,  limited visualization of screen secondary to bright light that could not be turned off. Comparison Study: No prior study on file Performing Technologist: Sherren Kerns RVS  Examination Guidelines: A complete evaluation includes B-mode imaging, spectral Doppler, color Doppler, and power Doppler as needed of all accessible portions of each vessel. Bilateral testing is considered an integral part of a complete examination. Limited examinations for reoccurring indications may be performed as noted. The reflux portion of the exam is performed with the patient in reverse Trendelenburg.  +---------+---------------+---------+-----------+----------+-------------------+ RIGHT    CompressibilityPhasicitySpontaneityPropertiesThrombus Aging      +---------+---------------+---------+-----------+----------+-------------------+ CFV                     Yes      Yes                  patent by color and  Reflex Scores:      Patellar reflexes are 0 on the right side and 0 on the left side.    Comments: Exam limited due to patient participation. Chronic weakness and lower extremity neuropathy   Psychiatric:        Behavior: Behavior is uncooperative.      Pertinent Labs, Studies, and Procedures:     Latest Ref Rng & Units 12/29/2022    5:19 AM 12/28/2022    9:44 AM 12/28/2022    7:17 AM  CBC  WBC 4.0 - 10.5 K/uL 6.5     Hemoglobin 13.0 - 17.0 g/dL 96.0  45.4  09.8   Hematocrit 39.0 - 52.0 % 46.0  42.0  44.0   Platelets 150 - 400 K/uL 127          Latest Ref Rng & Units 12/29/2022    5:19 AM 12/28/2022    9:44 AM 12/28/2022    8:00 AM  CMP  Glucose 70 - 99 mg/dL 119   147   BUN 8 - 23 mg/dL 18   15   Creatinine 8.29 - 1.24 mg/dL 5.62   1.30   Sodium 865 - 145 mmol/L 136  141  136   Potassium 3.5 - 5.1 mmol/L 4.2  4.2  4.4   Chloride 98 - 111 mmol/L 99   100   CO2 22 - 32 mmol/L 30   31   Calcium 8.9 - 10.3 mg/dL 8.3   8.5   Total Protein 6.5 - 8.1 g/dL 8.3   7.8   Total Bilirubin 0.3 - 1.2 mg/dL 0.8   1.0   Alkaline Phos 38 - 126 U/L 58   58   AST 15 - 41 U/L 13   17   ALT 0 - 44 U/L 18   19     VAS [place] LOWER EXTREMITY VENOUS (DVT) (7a-7p)  Result Date: 12/28/2022  Lower Venous DVT Study Patient Name:  Don Brown  Date of Exam:   12/28/2022 Medical Rec #: [id number]          Accession #:    [id number] Date of Birth: 26-Jan-1956          Patient Gender: M Patient Age:   67 years Exam Location:  [facility] Procedure:      VAS [place] LOWER EXTREMITY VENOUS (DVT) Referring Phys: Janey Genta NANAVATI --------------------------------------------------------------------------------  Indications: SOB.  Limitations: Poor ultrasound/tissue interface and patient would not remove pants,  limited visualization of screen secondary to bright light that could not be turned off. Comparison Study: No prior study on file Performing Technologist: Sherren Kerns RVS  Examination Guidelines: A complete evaluation includes B-mode imaging, spectral Doppler, color Doppler, and power Doppler as needed of all accessible portions of each vessel. Bilateral testing is considered an integral part of a complete examination. Limited examinations for reoccurring indications may be performed as noted. The reflux portion of the exam is performed with the patient in reverse Trendelenburg.  +---------+---------------+---------+-----------+----------+-------------------+ RIGHT    CompressibilityPhasicitySpontaneityPropertiesThrombus Aging      +---------+---------------+---------+-----------+----------+-------------------+ CFV                     Yes      Yes                  patent by color and

## 2022-12-29 NOTE — Plan of Care (Signed)

## 2022-12-29 NOTE — Discharge Instructions (Addendum)
Dear Don Brown,   You were brought in by ambulance to [facility] emergency department for weakness and confusion. You were evaluated by the neurologist to evaluate for a recent stroke. CT imaging did not show a recent stroke, however imaging did show evidence of a prior stroke in the past. They did not believe that you had a recent stroke, however they were unable to definitively say without additional images collected. They recommended following up with your Neurologist in clinic for your weakness. We believe that your increased confusion and weakness could have been due to the amount of oxycodone you took at home and the multiple rounds of IV pain medicines you received at the hospital. Please continue to be mindful about the amount of oxycodone you use to control your chronic pain. It can put you at an increased risk for falls or confusion. We also recommend setting up a follow-up appointment with your [facility] provider to discuss this hospitalization. We recommended discussing adjusting or adding medications to decrease your chance of having a stroke. While also recommend reviewing chronic medical conditions to see if any adjustments need to be made to improve your health.   Sincerely,  [facility] Teaching [facility]

## 2022-12-29 NOTE — TOC Transition Note (Signed)
Transition of Care [facility]) - CM/SW Discharge Note   Patient Details  Name: Don Brown MRN: [id number] Date of Birth: 01-Nov-1955  [facility] CM/SW Contact:  Kermit Balo, RN Phone Number: 12/29/2022, 4:12 PM   Clinical Narrative:     Patient is discharging home with home health services through [place]. Information on the AVS. Frances Furbish will contact him for the first home visit. CM has sent information to the [place] about his admission and HH needs.  Family will provide supervision at home and transportation to home. Pt uses oxygen at home through [place]. Family will have at d/c for transport.  Final next level of care: Home w Home Health Services Barriers to Discharge: No Barriers Identified   Patient Goals and CMS Choice CMS Medicare.gov Compare Post Acute Care list provided to:: Patient Choice offered to / list presented to : Patient  Discharge Placement                         Discharge Plan and Services Additional resources added to the After Visit Summary for                            [facility] Arranged: PT, RN [facility] Agency: [facility] Health Care Date [facility] Agency Contacted: 12/29/22   Representative spoke with at [facility] Agency: Kandee Keen  Social Determinants of Health (SDOH) Interventions SDOH Screenings   Food Insecurity: No Food Insecurity (12/28/2022)  Housing: Medium Risk (12/28/2022)  Transportation Needs: No Transportation Needs (12/28/2022)  Utilities: Not At Risk (12/29/2022)  Tobacco Use: Low Risk  (12/28/2022)     Readmission Risk Interventions     No data to display

## 2022-12-29 NOTE — Inpatient Diabetes Management (Signed)
Inpatient Diabetes Program Recommendations  AACE/ADA: New Consensus Statement on Inpatient Glycemic Control (2015)  Target Ranges:  Prepandial:   less than 140 mg/dL      Peak postprandial:   less than 180 mg/dL (1-2 hours)      Critically ill patients:  140 - 180 mg/dL   Lab Results  Component Value Date   GLUCAP 183 (H) 12/29/2022   HGBA1C 10.3 (H) 12/28/2022    Review of Glycemic Control  Diabetes history: DM2 Outpatient Diabetes medications:  Lantus 40 units every day Novolog 3 units TID, Freestyle Libre 2  Current orders for Inpatient glycemic control:  Semglee 20 units every day Novolog 0-20 units TID and 0-5 units QHS  Met with patient at bedside.  Reviewed patient's current A1c of 10.3% (average BG of 249 mg/dL). Explained what a A1c is and what it measures. Also reviewed goal A1c with patient, importance of good glucose control @ home, and blood sugar goals.  He is current with his PCP.  His CGM was removed when he came to the hospital.  He uses a reader to follow his BG.  Encouraged him to reach out to his PCP and request the FSL 3 & reader.   Does not drink caloric beverages.  He has a glucometer at home for back up.  Reviewed hypoglycemia, signs, symptoms and treatments.  Will continue to follow while inpatient.  Thank you, Dulce Sellar, MSN, CDCES Diabetes Coordinator Inpatient Diabetes Program [phone] (team pager from 8a-5p)

## 2022-12-29 NOTE — Evaluation (Signed)
Physical Therapy Evaluation Patient Details Name: Bryxton Forshey MRN: [id number] DOB: 1955-09-25 Today's Date: 12/29/2022  History of Present Illness  Patient is a 67 y/o male admitted 12/28/22 with AMS and fall at home.  PMH positive for DM, CHF, chronic hypoxic respiratory failure on 2-4L O2, HTN, HLD, a-fib, chonic pancreatitis, opioid use, adjustment disorder, Cervical disc disorder with radiculopathy, CKD stage 3, MGUS, Morbid obesity, OSA.  Clinical Impression  Patient presents with decreased mobility due to generalized weakness and limited activity tolerance slightly more than at his baseline.  He feels medications making him sleepy at home and without much energy to do stuff.  Feel he can benefit from skilled PT in the acute setting and follow up HHPT initially possibly progressing to outpatient PT to improve safety, independence and endurance.         If plan is discharge home, recommend the following: A little help with walking and/or transfers   Can travel by private vehicle        Equipment Recommendations None recommended by PT  Recommendations for Other Services       Functional Status Assessment Patient has had a recent decline in their functional status and demonstrates the ability to make significant improvements in function in a reasonable and predictable amount of time.     Precautions / Restrictions Precautions Precautions: Fall Precaution Comments: Watch HR and O2      Mobility  Bed Mobility Overal bed mobility: Needs Assistance Bed Mobility: Supine to Sit, Sit to Supine     Supine to sit: Mod assist Sit to supine: Min assist   General bed mobility comments: son present and helping to lift trunk; to supine assist for guiding legs over EOB    Transfers Overall transfer level: Needs assistance Equipment used: Rolling walker (2 wheels) Transfers: Sit to/from Stand Sit to Stand: From elevated surface, Contact guard assist, Min assist            General transfer comment: pt pulls up on RW, increased help needed from chair used in hallway for seated rest    Ambulation/Gait Ambulation/Gait assistance: Min assist Gait Distance (Feet): 35 Feet (&15') Assistive device: Rolling walker (2 wheels) Gait Pattern/deviations: Step-to pattern, Step-through pattern, Decreased stride length, Trunk flexed, Wide base of support, Staggering right, Staggering left       General Gait Details: mild LOB in hallway with min A for safety and using UE's on walker to rebalance  Stairs            Wheelchair Mobility     Tilt Bed    Modified Rankin (Stroke Patients Only)       Balance Overall balance assessment: Needs assistance Sitting-balance support: Feet supported Sitting balance-Leahy Scale: Good     Standing balance support: Bilateral upper extremity supported Standing balance-Leahy Scale: Poor Standing balance comment: UE support for balance in standing                             Pertinent Vitals/Pain Pain Assessment Pain Assessment: Faces Faces Pain Scale: Hurts whole lot Pain Location: headache Pain Descriptors / Indicators: Headache Pain Intervention(s): Monitored during session    Home Living Family/patient expects to be discharged to:: Private residence Living Arrangements: Children Available Help at Discharge: Family;Available PRN/intermittently Type of Home: House Home Access: Stairs to enter Entrance Stairs-Rails: None Entrance Stairs-Number of Steps: 1   Home Layout: One level Home Equipment: Rollator (4 wheels);Cane - single point;Electric scooter;Toilet  riser Additional Comments: lift chair, though currently not working    Prior Function Prior Level of Function : Needs assist             Mobility Comments: walks in the home with cane and son assist; scooter does not fit in the home ADLs Comments: sponge bathes     Extremity/Trunk Assessment   Upper Extremity Assessment Upper  Extremity Assessment: Right hand dominant    Lower Extremity Assessment Lower Extremity Assessment: RLE deficits/detail;LLE deficits/detail RLE Deficits / Details: AROM WFL, strength grossly 4/5 throughout RLE Sensation: history of peripheral neuropathy LLE Deficits / Details: AROM WFL, strength grossly 4/5 throughout LLE Sensation: history of peripheral neuropathy    Cervical / Trunk Assessment Cervical / Trunk Assessment: Other exceptions Cervical / Trunk Exceptions: left lateral head tilt and trunk shortened on L seated EOB  Communication      Cognition Arousal: Alert Behavior During Therapy: WFL for tasks assessed/performed Overall Cognitive Status: Within Functional Limits for tasks assessed                                          General Comments General comments (skin integrity, edema, etc.): son and daughter present and supportive, SpO2 95% walking on 2-3L O2 and HR 100    Exercises Other Exercises Other Exercises: Encouraged sit to stand practice, but pt too fatigued   Assessment/Plan    PT Assessment Patient needs continued PT services  PT Problem List Decreased strength;Decreased balance;Decreased mobility;Decreased activity tolerance;Decreased knowledge of use of DME       PT Treatment Interventions DME instruction;Functional mobility training;Balance training;Patient/family education;Therapeutic activities;Gait training;Therapeutic exercise    PT Goals (Current goals can be found in the Care Plan section)  Acute Rehab PT Goals Patient Stated Goal: to get stronger PT Goal Formulation: With patient/family Time For Goal Achievement: 01/12/23 Potential to Achieve Goals: Good    Frequency Min 1X/week     Co-evaluation               AM-PAC PT "6 Clicks" Mobility  Outcome Measure Help needed turning from your back to your side while in a flat bed without using bedrails?: A Little Help needed moving from lying on your back to sitting  on the side of a flat bed without using bedrails?: A Little Help needed moving to and from a bed to a chair (including a wheelchair)?: A Little Help needed standing up from a chair using your arms (e.g., wheelchair or bedside chair)?: A Little Help needed to walk in hospital room?: A Little Help needed climbing 3-5 steps with a railing? : Total 6 Click Score: 16    End of Session Equipment Utilized During Treatment: Gait belt;Oxygen Activity Tolerance: Patient limited by fatigue Patient left: in bed;with call bell/phone within reach;with family/visitor present   PT Visit Diagnosis: Other abnormalities of gait and mobility (R26.89);Repeated falls (R29.6);Muscle weakness (generalized) (M62.81)    Time: 1610-9604 PT Time Calculation (min) (ACUTE ONLY): 26 min   Charges:   PT Evaluation $PT Eval Moderate Complexity: 1 Mod PT Treatments $Gait Training: 8-22 mins PT General Charges $$ ACUTE PT VISIT: 1 Visit         Sheran Lawless, PT Acute Rehabilitation Services Office:[phone] 12/29/2022   Elray Mcgregor 12/29/2022, 2:49 PM

## 2023-04-21 ENCOUNTER — Emergency Department: Payer: Medicare Other

## 2023-04-21 ENCOUNTER — Emergency Department: Payer: Non-veteran care

## 2023-04-21 ENCOUNTER — Inpatient Hospital Stay
Admission: EM | Admit: 2023-04-21 | Discharge: 2023-04-23 | DRG: 439 | Disposition: A | Discharge status: Home / Self Care | Payer: Medicare Other | Attending: Internal Medicine | Admitting: Internal Medicine

## 2023-04-21 ENCOUNTER — Other Ambulatory Visit: Payer: Self-pay

## 2023-04-21 DIAGNOSIS — K859 Acute pancreatitis without necrosis or infection, unspecified: Secondary | ICD-10-CM | POA: Diagnosis not present

## 2023-04-21 DIAGNOSIS — E785 Hyperlipidemia, unspecified: Secondary | ICD-10-CM | POA: Diagnosis present

## 2023-04-21 DIAGNOSIS — Z79899 Other long term (current) drug therapy: Secondary | ICD-10-CM

## 2023-04-21 DIAGNOSIS — Z8249 Family history of ischemic heart disease and other diseases of the circulatory system: Secondary | ICD-10-CM

## 2023-04-21 DIAGNOSIS — F32A Depression, unspecified: Secondary | ICD-10-CM | POA: Diagnosis present

## 2023-04-21 DIAGNOSIS — Z833 Family history of diabetes mellitus: Secondary | ICD-10-CM

## 2023-04-21 DIAGNOSIS — Z807 Family history of other malignant neoplasms of lymphoid, hematopoietic and related tissues: Secondary | ICD-10-CM

## 2023-04-21 DIAGNOSIS — D696 Thrombocytopenia, unspecified: Secondary | ICD-10-CM | POA: Diagnosis present

## 2023-04-21 DIAGNOSIS — E1165 Type 2 diabetes mellitus with hyperglycemia: Secondary | ICD-10-CM | POA: Diagnosis present

## 2023-04-21 DIAGNOSIS — K5900 Constipation, unspecified: Secondary | ICD-10-CM | POA: Diagnosis present

## 2023-04-21 DIAGNOSIS — Z6841 Body Mass Index (BMI) 40.0 and over, adult: Secondary | ICD-10-CM

## 2023-04-21 DIAGNOSIS — R0902 Hypoxemia: Secondary | ICD-10-CM | POA: Diagnosis present

## 2023-04-21 DIAGNOSIS — Z9049 Acquired absence of other specified parts of digestive tract: Secondary | ICD-10-CM

## 2023-04-21 DIAGNOSIS — G4733 Obstructive sleep apnea (adult) (pediatric): Secondary | ICD-10-CM | POA: Diagnosis present

## 2023-04-21 DIAGNOSIS — F419 Anxiety disorder, unspecified: Secondary | ICD-10-CM | POA: Diagnosis present

## 2023-04-21 DIAGNOSIS — G8929 Other chronic pain: Secondary | ICD-10-CM | POA: Diagnosis present

## 2023-04-21 DIAGNOSIS — Z885 Allergy status to narcotic agent status: Secondary | ICD-10-CM

## 2023-04-21 DIAGNOSIS — Z90411 Acquired partial absence of pancreas: Secondary | ICD-10-CM

## 2023-04-21 DIAGNOSIS — I10 Essential (primary) hypertension: Secondary | ICD-10-CM

## 2023-04-21 DIAGNOSIS — Z794 Long term (current) use of insulin: Secondary | ICD-10-CM

## 2023-04-21 DIAGNOSIS — Z7982 Long term (current) use of aspirin: Secondary | ICD-10-CM

## 2023-04-21 DIAGNOSIS — Z9981 Dependence on supplemental oxygen: Secondary | ICD-10-CM

## 2023-04-21 DIAGNOSIS — E119 Type 2 diabetes mellitus without complications: Secondary | ICD-10-CM

## 2023-04-21 DIAGNOSIS — I5042 Chronic combined systolic (congestive) and diastolic (congestive) heart failure: Secondary | ICD-10-CM | POA: Diagnosis present

## 2023-04-21 DIAGNOSIS — K861 Other chronic pancreatitis: Secondary | ICD-10-CM | POA: Diagnosis present

## 2023-04-21 DIAGNOSIS — I110 Hypertensive heart disease with heart failure: Secondary | ICD-10-CM | POA: Diagnosis present

## 2023-04-21 DIAGNOSIS — I482 Chronic atrial fibrillation, unspecified: Secondary | ICD-10-CM | POA: Diagnosis present

## 2023-04-21 DIAGNOSIS — D649 Anemia, unspecified: Secondary | ICD-10-CM | POA: Diagnosis present

## 2023-04-21 DIAGNOSIS — R109 Unspecified abdominal pain: Secondary | ICD-10-CM | POA: Diagnosis not present

## 2023-04-21 LAB — COMPREHENSIVE METABOLIC PANEL
ALT: 17 U/L (ref 0–44)
AST: 19 U/L (ref 15–41)
Albumin: 3.1 g/dL — ABNORMAL LOW (ref 3.5–5.0)
Alkaline Phosphatase: 54 U/L (ref 38–126)
Anion gap: 8 (ref 5–15)
BUN: 18 mg/dL (ref 8–23)
CO2: 28 mmol/L (ref 22–32)
Calcium: 8.4 mg/dL — ABNORMAL LOW (ref 8.9–10.3)
Chloride: 102 mmol/L (ref 98–111)
Creatinine, Ser: 0.77 mg/dL (ref 0.61–1.24)
GFR, Estimated: >60 mL/min (ref >60)
Glucose, Bld: 318 mg/dL — ABNORMAL HIGH (ref 70–99)
Potassium: 3.7 mmol/L (ref 3.5–5.1)
Sodium: 138 mmol/L (ref 135–145)
Total Bilirubin: 1 mg/dL (ref 0.0–1.2)
Total Protein: 8.3 g/dL — ABNORMAL HIGH (ref 6.5–8.1)

## 2023-04-21 LAB — TRIGLYCERIDES: Triglycerides: 107 mg/dL (ref <150)

## 2023-04-21 LAB — CBC
HCT: 43.1 % (ref 39.0–52.0)
Hemoglobin: 13.4 g/dL (ref 13.0–17.0)
MCH: 27.2 pg (ref 26.0–34.0)
MCHC: 31.1 g/dL (ref 30.0–36.0)
MCV: 87.6 fL (ref 80.0–100.0)
Platelets: 150 10*3/uL (ref 150–400)
RBC: 4.92 MIL/uL (ref 4.22–5.81)
RDW: 13.8 % (ref 11.5–15.5)
WBC: 7.7 10*3/uL (ref 4.0–10.5)
nRBC: 0 % (ref 0.0–0.2)

## 2023-04-21 LAB — LIPASE, BLOOD: Lipase: 147 U/L — ABNORMAL HIGH (ref 11–51)

## 2023-04-21 LAB — CBG MONITORING, ED: Glucose-Capillary: 397 mg/dL — ABNORMAL HIGH (ref 70–99)

## 2023-04-21 MED ORDER — DIGOXIN 125 MCG PO TABS
125.0000 ug | ORAL_TABLET | ORAL | Status: DC
  Administered 2023-04-22 – 2023-04-23 (×2): 125 ug via ORAL
  Filled 2023-04-21 (×2): qty 1

## 2023-04-21 MED ORDER — INSULIN ASPART 100 UNIT/ML IJ SOLN
0.0000 [IU] | Freq: Three times a day (TID) | INTRAMUSCULAR | Status: DC
  Administered 2023-04-22: 2 [IU] via SUBCUTANEOUS
  Administered 2023-04-22 (×2): 5 [IU] via SUBCUTANEOUS
  Administered 2023-04-23: 2 [IU] via SUBCUTANEOUS
  Filled 2023-04-21 (×4): qty 1

## 2023-04-21 MED ORDER — GABAPENTIN 300 MG PO CAPS
300.0000 mg | ORAL_CAPSULE | Freq: Three times a day (TID) | ORAL | Status: DC
  Filled 2023-04-21: qty 1

## 2023-04-21 MED ORDER — POLYVINYL ALCOHOL 1.4 % OP SOLN
1.0000 [drp] | Freq: Three times a day (TID) | OPHTHALMIC | Status: DC
  Administered 2023-04-22 (×2): 1 [drp] via OPHTHALMIC
  Filled 2023-04-21: qty 15

## 2023-04-21 MED ORDER — HYDRALAZINE HCL 20 MG/ML IJ SOLN: 5.0000 mg | INTRAMUSCULAR | Status: DC | PRN

## 2023-04-21 MED ORDER — ONDANSETRON HCL 4 MG/2ML IJ SOLN
4.0000 mg | Freq: Three times a day (TID) | INTRAMUSCULAR | Status: DC | PRN
  Administered 2023-04-21: 4 mg via INTRAVENOUS
  Filled 2023-04-21: qty 2

## 2023-04-21 MED ORDER — OXCARBAZEPINE 300 MG PO TABS
300.0000 mg | ORAL_TABLET | Freq: Three times a day (TID) | ORAL | Status: DC
  Administered 2023-04-22: 300 mg via ORAL
  Filled 2023-04-21: qty 2
  Filled 2023-04-21 (×2): qty 1

## 2023-04-21 MED ORDER — FUROSEMIDE 40 MG PO TABS
80.0000 mg | ORAL_TABLET | Freq: Every day | ORAL | Status: DC
  Administered 2023-04-22 – 2023-04-23 (×2): 80 mg via ORAL
  Filled 2023-04-21 (×2): qty 2

## 2023-04-21 MED ORDER — METOCLOPRAMIDE HCL 5 MG/ML IJ SOLN: 10.0000 mg | Freq: Once | INTRAMUSCULAR | Status: DC

## 2023-04-21 MED ORDER — HYDROMORPHONE HCL 1 MG/ML IJ SOLN
1.0000 mg | INTRAMUSCULAR | Status: DC | PRN
  Administered 2023-04-21: 1 mg via INTRAVENOUS
  Filled 2023-04-21: qty 1

## 2023-04-21 MED ORDER — POLYETHYLENE GLYCOL 3350 17 G PO PACK: 17.0000 g | PACK | Freq: Every day | ORAL | Status: DC | PRN

## 2023-04-21 MED ORDER — HYDROMORPHONE HCL 1 MG/ML IJ SOLN
0.5000 mg | INTRAMUSCULAR | Status: DC | PRN
  Administered 2023-04-21: 0.5 mg via INTRAVENOUS
  Filled 2023-04-21 (×2): qty 0.5

## 2023-04-21 MED ORDER — OXYCODONE HCL 5 MG PO TABS
5.0000 mg | ORAL_TABLET | Freq: Four times a day (QID) | ORAL | Status: DC | PRN
  Administered 2023-04-23 (×2): 5 mg via ORAL
  Filled 2023-04-21 (×3): qty 1

## 2023-04-21 MED ORDER — ONDANSETRON HCL 4 MG/2ML IJ SOLN
4.0000 mg | Freq: Once | INTRAMUSCULAR | Status: AC
  Administered 2023-04-21: 4 mg via INTRAVENOUS
  Filled 2023-04-21: qty 2

## 2023-04-21 MED ORDER — SODIUM CHLORIDE 0.9 % IV BOLUS: 500.0000 mL | Freq: Once | INTRAVENOUS | Status: DC

## 2023-04-21 MED ORDER — PANCRELIPASE (LIP-PROT-AMYL) 12000-38000 UNITS PO CPEP
24000.0000 [IU] | ORAL_CAPSULE | Freq: Three times a day (TID) | ORAL | Status: DC
  Administered 2023-04-22: 24000 [IU] via ORAL
  Filled 2023-04-21 (×3): qty 2

## 2023-04-21 MED ORDER — HYDROMORPHONE HCL 1 MG/ML IJ SOLN
1.0000 mg | INTRAMUSCULAR | Status: DC | PRN
  Administered 2023-04-21 – 2023-04-22 (×3): 1 mg via INTRAVENOUS
  Filled 2023-04-21 (×3): qty 1

## 2023-04-21 MED ORDER — ASPIRIN 81 MG PO TBEC
81.0000 mg | DELAYED_RELEASE_TABLET | Freq: Every day | ORAL | Status: DC
  Administered 2023-04-22 – 2023-04-23 (×2): 81 mg via ORAL
  Filled 2023-04-21 (×2): qty 1

## 2023-04-21 MED ORDER — SODIUM CHLORIDE 0.9 % IV SOLN: INTRAVENOUS | Status: DC

## 2023-04-21 MED ORDER — INSULIN GLARGINE-YFGN 100 UNIT/ML ~~LOC~~ SOLN
30.0000 [IU] | Freq: Every day | SUBCUTANEOUS | Status: DC
  Administered 2023-04-21 – 2023-04-22 (×2): 30 [IU] via SUBCUTANEOUS
  Filled 2023-04-21 (×3): qty 0.3

## 2023-04-21 MED ORDER — SENNOSIDES-DOCUSATE SODIUM 8.6-50 MG PO TABS
1.0000 | ORAL_TABLET | Freq: Two times a day (BID) | ORAL | Status: DC
  Filled 2023-04-21 (×4): qty 1

## 2023-04-21 MED ORDER — SODIUM CHLORIDE 0.9 % IV BOLUS
500.0000 mL | Freq: Once | INTRAVENOUS | Status: AC
  Administered 2023-04-21: 500 mL via INTRAVENOUS

## 2023-04-21 MED ORDER — CARVEDILOL 3.125 MG PO TABS
3.1250 mg | ORAL_TABLET | Freq: Two times a day (BID) | ORAL | Status: DC
  Administered 2023-04-22 – 2023-04-23 (×4): 3.125 mg via ORAL
  Filled 2023-04-21 (×4): qty 1

## 2023-04-21 MED ORDER — ACETAMINOPHEN 325 MG PO TABS
650.0000 mg | ORAL_TABLET | Freq: Four times a day (QID) | ORAL | Status: DC | PRN
  Administered 2023-04-22 – 2023-04-23 (×2): 650 mg via ORAL
  Filled 2023-04-21 (×2): qty 2

## 2023-04-21 MED ORDER — ENOXAPARIN SODIUM 60 MG/0.6ML IJ SOSY
0.5000 mg/kg | PREFILLED_SYRINGE | INTRAMUSCULAR | Status: DC
  Filled 2023-04-21 (×2): qty 0.6

## 2023-04-21 MED ORDER — POLYVINYL ALCOHOL 1.4 % OP SOLN
1.0000 [drp] | Freq: Every day | OPHTHALMIC | Status: DC
  Filled 2023-04-21: qty 15

## 2023-04-21 MED ORDER — INSULIN ASPART 100 UNIT/ML IJ SOLN: 0.0000 [IU] | Freq: Every day | INTRAMUSCULAR | Status: DC

## 2023-04-21 NOTE — ED Triage Notes (Signed)
Pt here via EMS from home for 10/10 abd pain since 5am. Hx of chronic pancreatitis. Took a 2mg  tab of dilaudid at 0700 at home. Refused pain meds or zofran with EMS. Wears 2L [place] at baseline. Pt abd pain is left and does not radiate.

## 2023-04-21 NOTE — ED Notes (Signed)
This RN called over to patient. Patient c/o abd pain and SOB. Patient has removed O2- pt placed back on O2 and given new tank.

## 2023-04-21 NOTE — ED Triage Notes (Signed)
First Nurse Note: Patient to ED via EMS from home for 10/10 abd pain since 5am. Hx of chronic pancreatitis. Took a 2mg  tab of dilaudid at 0700 at home. Refused pain meds or zofran with EMS. Wears 2L [place] at baseline. Aox4  20 L AC 159 cbg 82 HR 220/100 98% 2L

## 2023-04-21 NOTE — ED Notes (Addendum)
Patient and family to First Nurse Desk multiple times. Patient and family explained that we are unable to given patient pain medication without doctor orders. Patient and family argumentative with this RN. Pt and family explained process. This RN apologized for wait.

## 2023-04-21 NOTE — H&P (Signed)
History and Physical    Quavion [facility]le [facility] ZOX:[id number] DOB: 09/19/1955 DOA: 04/21/2023  Referring MD/NP/PA:   PCP: Sherwood Gambler, MD   Patient coming from:  The patient is coming from home.     Chief Complaint: Nausea, vomiting, abdominal pain  HPI: Don Brown is a 68 y.o. 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  Patient has history of chronic pancreatitis, stating that his abdominal pain has been progressively worsening since this morning.  Associated with nausea, will nonbilious nonbloody vomiting.  No diarrhea.  He is constipated.  No fever or chills.  His abdominal pain is mainly located in the upper abdomen, constant, severe, sharp, nonradiating.  Not aggravated or alleviated by any known factors.  Patient has mild cough and mild shortness of breath, denies chest pain.  No symptoms of UTI.  Patient denies drinking alcohol.  Data reviewed independently and ED Course: pt was found to have lipase of 147, WBC 7.1, triglyceride level 107, GFR> 60, temperature normal, blood pressure 179/98, heart rate 89, RR 18, oxygen saturation 98% on room air.  Patient is placed on telemetry bed for observation.   EKG:  Not done in ED, will get one.     CT-abdomen/pelvis: 1. Pancreatic duct dilatation is mildly increased from comparison exam. Findings concerning for acute on chronic pancreatitis. Minimal peripancreatic inflammation. No organized fluid collections. 2. Venous collaterals about the stomach suggest portal hypertension. Spleen is normal volume.    Review of Systems:   General: no fevers, chills, no body  weight gain, has poor appetite, has fatigue HEENT: no blurry vision, hearing changes or sore throat Respiratory: has dyspnea, coughing, no wheezing CV: no chest pain, no palpitations GI: has nausea, vomiting, abdominal pain, no diarrhea, constipation GU: no dysuria, burning on urination, increased urinary frequency, hematuria  Ext: no leg edema Neuro: no unilateral weakness, numbness, or tingling, no vision change or hearing loss Skin: no rash, no skin tear. MSK: No muscle spasm, no deformity, no limitation of range of movement in spin Heme: No easy bruising.  Travel history: No recent long distant travel.   Allergy:  Allergies  Allergen Reactions   Morphine And Codeine Nausea And Vomiting    Past Medical History:  Diagnosis Date   Anemia    Anxiety    Atrial fibrillation (HCC)    Cervical compression fracture (HCC)    Cervical disk disease requiring surgery   CHF (congestive heart failure) (HCC)    Chronic pain    Chronic systolic heart failure (HCC)    LVEF 40-45%   Degenerative joint disease    Depression    Essential hypertension, benign    Headache    "stress"   Heart murmur    History of blood transfusion 2007   "related to OR"   History of renal failure    Requiring hemodialysis in the past with normal renal function at this point   History of ventral hernia repair    Hyperlipidemia    MGUS (monoclonal gammopathy of unknown significance)    "or myelona" (05/07/2014)   Mitral valve prolapse    Morbid obesity (HCC)    Myeloma (HCC)    "or MGUS" (05/07/2014)  Narcotic dependence, episodic use (HCC)    On home oxygen therapy    "2L prn" (05/07/2014)   Pancreatitis    Severe pancreatitis status post surgery with 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 status post multiple endoscopic drainage procedures and stents    Respiratory failure (HCC)    History of ventilatory-dependent respiratory failure and tracheostomy    Sleep apnea    "don't wear mask"  (05/07/2014)   Type 2 diabetes mellitus [facility])     Past Surgical History:  Procedure Laterality Date   BONE MARROW BIOPSY  02/2014   CATARACT EXTRACTION W/ INTRAOCULAR LENS  IMPLANT, BILATERAL Bilateral    CHOLECYSTECTOMY     HERNIA REPAIR     INSERTION OF MESH  10/2006   IR RADIOLOGIST EVAL & MGMT  08/18/2017   PANCREAS SURGERY     "placed stents; multiple pseudo cysts removal"   SKIN GRAFT     TRACHEOSTOMY  2007   VENTRAL HERNIA REPAIR      Social History:  reports that he has never smoked. He has never used smokeless tobacco. He reports current drug use. He reports that he does not drink alcohol.  Family History:  Family History  Problem Relation Age of Onset   Lymphoma Father    Hypertension Mother    Diabetes Mother      Prior to Admission medications   Medication Sig Start Date End Date Taking? Authorizing Provider  APPLE CIDER VINEGAR PO Take 1 tablet by mouth 3 (three) times daily.    [provider]  aspirin 81 MG tablet Take 81 mg by mouth daily before breakfast.     [provider]  benazepril (LOTENSIN) 10 MG tablet Take 10 mg by mouth daily. Take 1/2 tablet daily by mouth    [provider]  Carboxymethylcellulose Sod PF 0.5 % SOLN Place 1 drop into both eyes 5 (five) times daily.    [provider]  carvedilol (COREG) 6.25 MG tablet Take 3.125 mg by mouth 2 (two) times daily.    [provider]  Cholecalciferol 2000 units TABS Take 1 tablet by mouth daily.    [provider]  Continuous Blood Gluc Sensor (FREESTYLE LIBRE 2 SENSOR) MISC 1 Device by Does not apply route every 14 (fourteen) days. 02/20/21   Romero Belling, MD  Cyanocobalamin (VITAMIN B 12 PO) Take 1 tablet by mouth daily.     [provider]  digoxin (LANOXIN) 0.125 MG tablet Take 1 tablet (125 mcg total) by mouth every morning. 02/12/14   Rodolph Bong, MD  furosemide (LASIX) 80 MG tablet Take 80 mg by mouth daily. 03/06/22   [provider]  gabapentin (NEURONTIN) 600 MG tablet Take 0.5 tablets (300 mg total) by mouth 3 (three) times daily. 12/29/22   Peterson Ao, MD  insulin aspart (NOVOLOG) 100 UNIT/ML injection Inject into the skin 3 (three) times daily before meals. (According to sliding scale)    [provider]  insulin glargine (LANTUS) 100 UNIT/ML injection Inject 40 Units into the skin daily.    [provider]  Misc Natural Products (GINSENG COMPLEX PO) Take 1 tablet by mouth daily.    [provider]  Omega-3 1000 MG CAPS Take 1 capsule by mouth daily.    [provider]  OXcarbazepine (TRILEPTAL) 150 MG tablet Take 150 mg by mouth 2 (two) times daily. Take 2 tablets by mouth 3 times daily for headaches.    [provider]  Pancrelipase, Lip-Prot-Amyl,  24000-76000 units CPEP Take 24,000 Units by mouth 3 (three) times daily.    [provider]  sildenafil (VIAGRA) 100 MG tablet Take 50 mg by mouth daily as needed for erectile dysfunction.    [provider]  triamcinolone cream (KENALOG) 0.1 % Apply 1 Application topically 2 (two) times daily as needed (rash).    [provider]  TURMERIC PO Take 1 tablet by mouth daily.    [provider]    Physical Exam: Vitals:   04/21/23 1540 04/21/23 1700 04/21/23 2140 04/21/23 2141  BP: (!) 171/76 (!) 179/98    Pulse: 93 89 94 86  Resp: 18 18 12 11   Temp:      TempSrc:      SpO2: 98% 100% 100% 100%   General: Not in acute distress HEENT:       Eyes: PERRL, EOMI, no jaundice       ENT: No discharge from the ears and nose, no pharynx injection, no tonsillar enlargement.        Neck: No JVD, no bruit, no mass felt. Heme: No neck lymph node enlargement. Cardiac: S1/S2, RRR, No murmurs, No gallops or rubs. Respiratory: No rales, wheezing, rhonchi or rubs. GI: Soft, nondistended, has tenderness in upper abdomen, has a big surgical scar, no rebound pain, no organomegaly, BS  present. GU: No hematuria Ext: No pitting leg edema bilaterally. 1+DP/PT pulse bilaterally. Musculoskeletal: No joint deformities, No joint redness or warmth, no limitation of ROM in spin. Skin: No rashes.  Neuro: Alert, oriented X3, cranial nerves II-XII grossly intact, moves all extremities normally. Psych: Patient is not psychotic, no suicidal or hemocidal ideation.  Labs on Admission: I have personally reviewed following labs and imaging studies  CBC: Recent Labs  Lab 04/21/23 0921  WBC 7.7  HGB 13.4  HCT 43.1  MCV 87.6  PLT 150   Basic Metabolic Panel: Recent Labs  Lab 04/21/23 0921  NA 138  K 3.7  CL 102  CO2 28  GLUCOSE 318*  BUN 18  CREATININE 0.77  CALCIUM 8.4*   GFR: CrCl cannot be calculated (Unknown ideal weight.). Liver Function Tests: Recent Labs  Lab 04/21/23 0921  AST 19  ALT 17  ALKPHOS 54  BILITOT 1.0  PROT 8.3*  ALBUMIN 3.1*   Recent Labs  Lab 04/21/23 0921  LIPASE 147*   No results for input(s): "AMMONIA" in the last 168 hours. Coagulation Profile: No results for input(s): "INR", "PROTIME" in the last 168 hours. Cardiac Enzymes: No results for input(s): "CKTOTAL", "CKMB", "CKMBINDEX", "TROPONINI" in the last 168 hours. BNP (last 3 results) No results for input(s): "PROBNP" in the last 8760 hours. HbA1C: No results for input(s): "HGBA1C" in the last 72 hours. CBG: No results for input(s): "GLUCAP" in the last 168 hours. Lipid Profile: Recent Labs    04/21/23 0921  TRIG 107   Thyroid Function Tests: No results for input(s): "TSH", "T4TOTAL", "FREET4", "T3FREE", "THYROIDAB" in the last 72 hours. Anemia Panel: No results for input(s): "VITAMINB12", "FOLATE", "FERRITIN", "TIBC", "IRON", "RETICCTPCT" in the last 72 hours. Urine analysis:    Component Value Date/Time   COLORURINE YELLOW (A) 11/26/2021 0921   APPEARANCEUR CLEAR (A) 11/26/2021 0921   LABSPEC 1.022 11/26/2021 0921   PHURINE 5.0 11/26/2021 0921   GLUCOSEU >=500  (A) 11/26/2021 0921   HGBUR NEGATIVE 11/26/2021 0921   BILIRUBINUR NEGATIVE 11/26/2021 0921   KETONESUR 5 (A) 11/26/2021 0921   PROTEINUR 30 (A) 11/26/2021 0921   UROBILINOGEN 1.0 01/03/2015  5284   NITRITE NEGATIVE 11/26/2021 0921   LEUKOCYTESUR NEGATIVE 11/26/2021 0921   Sepsis Labs: @LABRCNTIP (procalcitonin:4,lacticidven:4) )No results found for this or any previous visit (from the past 240 hours).   Radiological Exams on Admission:   Assessment/Plan Principal Problem:   Acute recurrent pancreatitis Active Problems:   Chronic combined systolic and diastolic CHF (congestive heart failure) (HCC)   HTN (hypertension), benign   Diabetes mellitus without complication (HCC)   Chronic atrial fibrillation (HCC)   HLD (hyperlipidemia)   Morbid obesity (HCC)   Assessment and Plan:  Acute on chronic pancreatitis (HCC):  Lipase  147. TG level normal 107. No fever or leukocytosis.   -will place in tele bed for obs -As needed Dilaudid and oxycodone for pain -IV fluid: Patient received 500 m L normal saline, will continue 50 cc/h (patient has EF of 20-25%, limiting aggressive IV fluid treatment) -As needed Zofran -N.p.o.   Chronic atrial fibrillation (HCC): HR 80s.  Patient is not taking anticoagulants. -Continue digoxin, Coreg   Diabetes mellitus without complication [facility]): Recent A1c 10.3, poorly controlled.  Patient is is taking NovoLog and Lantus 40 units daily -Sliding scale insulin -Glargine insulin 30 units daily   HLD (hyperlipidemia):  -Atient's not taking Zocor currently -Follow-up with PCP    HTN (hypertension), benign - IV hydralazine as needed -Coreg -hold Lotensin since it may have contributed to recurrent pancreatitis   Chronic combined systolic and diastolic CHF (congestive heart failure) (HCC): 2D echo on 07/18/2021 showed EF of 20-25%.  Patient has no leg edema. Does not seem to have CHF exacerbation. -Hold Lasix due to pancreatitis tonight and restart it  tomorrow since pt need IVF -Watch volume status closely - check BNP   Morbid obesity (HCC): Body weight 134.2 kg, BMI 40.11 -Encourage losing weight -Exercise and healthy diet    DVT ppx: SQ Lovenox  Code Status: Full code    Family Communication:  Yes, patient's son  at bed side.      Disposition Plan:  Anticipate discharge back to previous environment  Consults called:  none  Admission status and Level of care: Telemetry Medical:    for obs     Dispo: The patient is from: Home              Anticipated d/c is to: Home              Anticipated d/c date is: 1 day              Patient currently is not medically stable to d/c.    Severity of Illness:  The appropriate patient status for this patient is OBSERVATION. Observation status is judged to be reasonable and necessary in order to provide the required intensity of service to ensure the patient's safety. The patient's presenting symptoms, physical exam findings, and initial radiographic and laboratory data in the context of their medical condition is felt to place them at decreased risk for further clinical deterioration. Furthermore, it is anticipated that the patient will be medically stable for discharge from the hospital within 2 midnights of admission.        Date of Service 04/21/2023    Lorretta Harp [facility]   If 7PM-7AM, please contact night-coverage [email or web address] 04/21/2023, 9:52 PM

## 2023-04-21 NOTE — ED Provider Notes (Signed)
[facility] Provider Note    Event Date/Time   First MD Initiated Contact with Patient 04/21/23 1519     (approximate)   History   Abdominal Pain   HPI  Don Brown is a 68 y.o. male with a history of chronic pancreatitis presents to the ER for evaluation of 24 hours of severe epigastric pain nausea vomiting.  He does not drink alcohol.  Has had some chills.  No measured fevers.  States the pain is all over.     Physical Exam   Triage Vital Signs: ED Triage Vitals  Encounter Vitals Group     BP 04/21/23 0911 (!) 170/79     Systolic BP Percentile --      Diastolic BP Percentile --      Pulse Rate 04/21/23 0902 93     Resp 04/21/23 0902 18     Temp 04/21/23 0902 98 F (36.7 C)     Temp Source 04/21/23 0902 Oral     SpO2 04/21/23 0902 98 %     Weight --      Height --      Head Circumference --      Peak Flow --      Pain Score 04/21/23 0911 10     Pain Loc --      Pain Education --      Exclude from Growth Chart --     Most recent vital signs: Vitals:   04/21/23 1540 04/21/23 1700  BP: (!) 171/76 (!) 179/98  Pulse: 93 89  Resp: 18 18  Temp:    SpO2: 98% 100%     Constitutional: Alert  Eyes: Conjunctivae are normal.  Head: Atraumatic. Nose: No congestion/rhinnorhea. Mouth/Throat: Mucous membranes are moist.   Neck: Painless ROM.  Cardiovascular:   Good peripheral circulation. Respiratory: Normal respiratory effort.  No retractions.  Gastrointestinal: Soft  with mild ttp Musculoskeletal:  no deformity Neurologic:  MAE spontaneously. No gross focal neurologic deficits are appreciated.  Skin:  Skin is warm, dry and intact. No rash noted. Psychiatric: Mood and affect are normal. Speech and behavior are normal.    ED Results / Procedures / Treatments   Labs (all labs ordered are listed, but only abnormal results are displayed) Labs Reviewed  LIPASE, BLOOD - Abnormal; Notable for the following components:      Result  Value   Lipase 147 (*)    All other components within normal limits  COMPREHENSIVE METABOLIC PANEL - Abnormal; Notable for the following components:   Glucose, Bld 318 (*)    Calcium 8.4 (*)    Total Protein 8.3 (*)    Albumin 3.1 (*)    All other components within normal limits  CBC  URINALYSIS, ROUTINE W REFLEX MICROSCOPIC     EKG     RADIOLOGY Please see ED Course for my review and interpretation.  I personally reviewed all radiographic images ordered to evaluate for the above acute complaints and reviewed radiology reports and findings.  These findings were personally discussed with the patient.  Please see medical record for radiology report.    PROCEDURES:  Critical Care performed: No  Procedures   MEDICATIONS ORDERED IN ED: Medications  HYDROmorphone (DILAUDID) injection 1 mg (1 mg Intravenous Given 04/21/23 1707)  metoCLOPramide (REGLAN) injection 10 mg (has no administration in time range)  sodium chloride 0.9 % bolus 500 mL (has no administration in time range)  sodium chloride 0.9 % bolus 500 mL (0 mLs  Intravenous Stopped 04/21/23 1659)  ondansetron (ZOFRAN) injection 4 mg (4 mg Intravenous Given 04/21/23 1537)     IMPRESSION / MDM / ASSESSMENT AND PLAN / ED COURSE  I reviewed the triage vital signs and the nursing notes.                              Differential diagnosis includes, but is not limited to, otitis, SBO, diverticulitis, colitis, appendicitis, viral illness, foodborne illness  Patient presenting to the ER for evaluation of symptoms as described above.  Based on symptoms, risk factors and considered above differential, this presenting complaint could reflect a potentially life-threatening illness therefore the patient will be placed on continuous pulse oximetry and telemetry for monitoring.  Laboratory evaluation will be sent to evaluate for the above complaints.  CT imaging will be ordered for the but differential given the patient's history pain  and active vomiting.  Will give IV Dilaudid for pain.  Patient demonstrates understanding of my recommendations but is refusing IV contrast due to stated intolerance.  He understands that it is not an oncologic reaction and the lack of contrast may limit some diagnostic ability.   Clinical Course as of 04/21/23 2024  Wed Apr 21, 2023  1746 Patient taken to CT for the second time and now refusing CT imaging. [PR]  1812 Patient frequently changing his story and refusing to get CT scan now because CT tech would not allow him to shield his pelvis.  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  Currently he is agreeing to CT abdomen only again demonstrating understanding that this is further limiting our diagnostic width. [PR]  2019 CT imaging on my review interpretation without evidence of perforation or obstruction.  Does show evidence of probable acute on chronic pancreatitis.  Patient with persistently elevated lipase roughly at baseline no leukocytosis or fever to suggest infective pancreatitis but patient with persistent nausea and vomiting after 11 hours.  Unable to tolerate p.o. will consult hospitalist for admission. [PR]    Clinical Course User Index [PR] Willy Eddy, MD     FINAL CLINICAL IMPRESSION(S) / ED DIAGNOSES   Final diagnoses:  Acute on chronic pancreatitis [facility])     Rx / DC Orders   ED Discharge Orders     None        Note:  This document was prepared using Dragon voice recognition software and may include unintentional dictation errors.    Willy Eddy, MD 04/21/23 2024

## 2023-04-22 ENCOUNTER — Inpatient Hospital Stay: Payer: Medicare Other

## 2023-04-22 ENCOUNTER — Encounter: Payer: Self-pay | Admitting: Internal Medicine

## 2023-04-22 DIAGNOSIS — I110 Hypertensive heart disease with heart failure: Secondary | ICD-10-CM | POA: Diagnosis present

## 2023-04-22 DIAGNOSIS — D696 Thrombocytopenia, unspecified: Secondary | ICD-10-CM | POA: Diagnosis present

## 2023-04-22 DIAGNOSIS — R0902 Hypoxemia: Secondary | ICD-10-CM | POA: Diagnosis present

## 2023-04-22 DIAGNOSIS — G4733 Obstructive sleep apnea (adult) (pediatric): Secondary | ICD-10-CM | POA: Diagnosis present

## 2023-04-22 DIAGNOSIS — K859 Acute pancreatitis without necrosis or infection, unspecified: Secondary | ICD-10-CM | POA: Diagnosis present

## 2023-04-22 DIAGNOSIS — K861 Other chronic pancreatitis: Secondary | ICD-10-CM | POA: Diagnosis present

## 2023-04-22 DIAGNOSIS — F419 Anxiety disorder, unspecified: Secondary | ICD-10-CM | POA: Diagnosis present

## 2023-04-22 DIAGNOSIS — R109 Unspecified abdominal pain: Secondary | ICD-10-CM | POA: Diagnosis present

## 2023-04-22 DIAGNOSIS — Z833 Family history of diabetes mellitus: Secondary | ICD-10-CM | POA: Diagnosis not present

## 2023-04-22 DIAGNOSIS — E1165 Type 2 diabetes mellitus with hyperglycemia: Secondary | ICD-10-CM | POA: Diagnosis present

## 2023-04-22 DIAGNOSIS — Z9981 Dependence on supplemental oxygen: Secondary | ICD-10-CM | POA: Diagnosis not present

## 2023-04-22 DIAGNOSIS — E785 Hyperlipidemia, unspecified: Secondary | ICD-10-CM | POA: Diagnosis present

## 2023-04-22 DIAGNOSIS — Z79899 Other long term (current) drug therapy: Secondary | ICD-10-CM | POA: Diagnosis not present

## 2023-04-22 DIAGNOSIS — Z794 Long term (current) use of insulin: Secondary | ICD-10-CM | POA: Diagnosis not present

## 2023-04-22 DIAGNOSIS — I5042 Chronic combined systolic (congestive) and diastolic (congestive) heart failure: Secondary | ICD-10-CM | POA: Diagnosis present

## 2023-04-22 DIAGNOSIS — Z90411 Acquired partial absence of pancreas: Secondary | ICD-10-CM | POA: Diagnosis not present

## 2023-04-22 DIAGNOSIS — Z807 Family history of other malignant neoplasms of lymphoid, hematopoietic and related tissues: Secondary | ICD-10-CM | POA: Diagnosis not present

## 2023-04-22 DIAGNOSIS — F32A Depression, unspecified: Secondary | ICD-10-CM | POA: Diagnosis present

## 2023-04-22 DIAGNOSIS — K5900 Constipation, unspecified: Secondary | ICD-10-CM | POA: Diagnosis present

## 2023-04-22 DIAGNOSIS — G8929 Other chronic pain: Secondary | ICD-10-CM | POA: Diagnosis present

## 2023-04-22 DIAGNOSIS — Z9049 Acquired absence of other specified parts of digestive tract: Secondary | ICD-10-CM | POA: Diagnosis not present

## 2023-04-22 DIAGNOSIS — I482 Chronic atrial fibrillation, unspecified: Secondary | ICD-10-CM | POA: Diagnosis present

## 2023-04-22 DIAGNOSIS — Z6841 Body Mass Index (BMI) 40.0 and over, adult: Secondary | ICD-10-CM | POA: Diagnosis not present

## 2023-04-22 DIAGNOSIS — D649 Anemia, unspecified: Secondary | ICD-10-CM | POA: Diagnosis present

## 2023-04-22 LAB — BASIC METABOLIC PANEL
Anion gap: 8 (ref 5–15)
BUN: 25 mg/dL — ABNORMAL HIGH (ref 8–23)
CO2: 32 mmol/L (ref 22–32)
Calcium: 8.6 mg/dL — ABNORMAL LOW (ref 8.9–10.3)
Chloride: 101 mmol/L (ref 98–111)
Creatinine, Ser: 1.17 mg/dL (ref 0.61–1.24)
GFR, Estimated: >60 mL/min (ref >60)
Glucose, Bld: 289 mg/dL — ABNORMAL HIGH (ref 70–99)
Potassium: 3.6 mmol/L (ref 3.5–5.1)
Sodium: 141 mmol/L (ref 135–145)

## 2023-04-22 LAB — GLUCOSE, CAPILLARY
Glucose-Capillary: 264 mg/dL — ABNORMAL HIGH (ref 70–99)
Glucose-Capillary: 157 mg/dL — ABNORMAL HIGH (ref 70–99)

## 2023-04-22 LAB — CBG MONITORING, ED
Glucose-Capillary: 323 mg/dL — ABNORMAL HIGH (ref 70–99)
Glucose-Capillary: 255 mg/dL — ABNORMAL HIGH (ref 70–99)
Glucose-Capillary: 201 mg/dL — ABNORMAL HIGH (ref 70–99)

## 2023-04-22 LAB — LIPASE, BLOOD: Lipase: 68 U/L — ABNORMAL HIGH (ref 11–51)

## 2023-04-22 LAB — BRAIN NATRIURETIC PEPTIDE: B Natriuretic Peptide: 315.8 pg/mL — ABNORMAL HIGH (ref 0.0–100.0)

## 2023-04-22 MED ORDER — PANCRELIPASE (LIP-PROT-AMYL) 36000-114000 UNITS PO CPEP
36000.0000 [IU] | ORAL_CAPSULE | Freq: Three times a day (TID) | ORAL | Status: DC
  Administered 2023-04-22 – 2023-04-23 (×4): 36000 [IU] via ORAL
  Filled 2023-04-22 (×4): qty 1

## 2023-04-22 MED ORDER — PROCHLORPERAZINE EDISYLATE 10 MG/2ML IJ SOLN
10.0000 mg | Freq: Once | INTRAMUSCULAR | Status: AC
  Administered 2023-04-22: 10 mg via INTRAVENOUS
  Filled 2023-04-22: qty 2

## 2023-04-22 MED ORDER — INSULIN ASPART 100 UNIT/ML IJ SOLN
10.0000 [IU] | Freq: Once | INTRAMUSCULAR | Status: AC
  Administered 2023-04-22: 10 [IU] via SUBCUTANEOUS
  Filled 2023-04-22: qty 1

## 2023-04-22 NOTE — Progress Notes (Signed)
Anticoagulation monitoring(Lovenox):  68 yo male ordered Lovenox 40 mg Q24h    Filed Weights   04/22/23 0235  Weight: 118.8 kg (262 lb)   BMI 35.5    Lab Results  Component Value Date   CREATININE 0.77 04/21/2023   CREATININE 1.10 12/29/2022   CREATININE 1.17 12/28/2022   Estimated Creatinine Clearance: 117.6 mL/min (by C-G formula based on SCr of 0.77 mg/dL). Hemoglobin & Hematocrit     Component Value Date/Time   HGB 13.4 04/21/2023 0921   HGB 14.2 07/30/2011 0939   HCT 43.1 04/21/2023 0921   HCT 44.5 07/30/2011 0939     Per Protocol for Patient with estCrcl > 30 ml/min and BMI > 30, will transition to Lovenox 60 mg Q24h.

## 2023-04-22 NOTE — Progress Notes (Signed)
       CROSS COVER NOTE  NAME: Don Brown MRN: [id number] DOB : 08-21-1955 ATTENDING PHYSICIAN: Loyce Dys, MD    Date of Service   04/22/2023   HPI/Events of Note   Message received from [facility]. Per protocol, making you aware that this patient refused his Trileptal this evening. Per his med reconciliation on his admission navigator it says that he no longer takes it at home. Thank you.   Interventions   Assessment/Plan: Meds reviewed, no seizure history noted on [place] records Trileptal discontinued        Don Mesa NP [facility] Cross Cover 7pm-7am - check amion for availability Pager [phone]

## 2023-04-22 NOTE — ED Notes (Signed)
Patient is refusing most of the "home" medications that we have listed stating he no longer takes them and our list is not up to date.

## 2023-04-22 NOTE — Inpatient Diabetes Management (Addendum)
Inpatient Diabetes Program Recommendations  AACE/ADA: New Consensus Statement on Inpatient Glycemic Control   Target Ranges:  Prepandial:   less than 140 mg/dL      Peak postprandial:   less than 180 mg/dL (1-2 hours)      Critically ill patients:  140 - 180 mg/dL    Latest Reference Range & Units 04/21/23 23:53 04/22/23 02:18 04/22/23 08:36  Glucose-Capillary 70 - 99 mg/dL 865 (H) 784 (H) 696 (H)   Review of Glycemic Control  Diabetes history: DM2 Outpatient Diabetes medications: Lantus 40 units daily, Novolog TID per sliding scale  Current orders for Inpatient glycemic control: Semglee 30 units at bedtime, Novolog 0-9 units TID, Novolog 0-5 units QHS  Inpatient Diabetes Program Recommendations:    Insulin: Please consider increasing Semglee to 34 units at bedtime and increasing Novolog correction to 0-15 units TID with meals.  Thanks, Orlando Penner, RN, MSN, CDCES Diabetes Coordinator Inpatient Diabetes Program [phone] (Team Pager from 8am to 5pm)

## 2023-04-22 NOTE — ED Notes (Signed)
Notified Dr. Clyde Lundborg patients CBG was 397. Verbal order received for 10 units of novalog given SQ.

## 2023-04-22 NOTE — ED Notes (Signed)
Pt placed O2 back on, O2 increased to 95%, placed on 3L Baseline

## 2023-04-22 NOTE — Progress Notes (Signed)
Progress Note   Patient: Don Brown HYQ:[id number] DOB: 22-Aug-1955 DOA: 04/21/2023     0 DOS: the patient was seen and examined on May 22, 2023   Brief hospital course:  Don Brown is a 68 y.o. 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  Lipase elevated and CT scan of the abdomen concerning for acute on chronic pancreatitis   Assessment and Plan:   Acute on chronic pancreatitis [facility]):  Lipase  147. TG level normal 107. No fever or leukocytosis. CT scan of the abdomen showing acute on chronic pancreatitis Continue IV fluid Continue as needed pain medication Advancement of diet as able to tolerate    Chronic atrial fibrillation (HCC): HR 80s.  Patient is not taking anticoagulants. Continue digoxin, Coreg   Diabetes mellitus without complication [facility]): Recent A1c 10.3, poorly controlled.  Patient is is taking NovoLog and Lantus 40 units daily -Sliding scale insulin Continue glargine Diabetic coordinator on board and case discussed, we appreciate input   HLD (hyperlipidemia):  -Atient's not taking Zocor currently -Follow-up with PCP   Hypoxia of unclear etiology Currently requiring 5 L of oxygen X-ray requested  HTN (hypertension), benign Continue above medications   Chronic combined systolic and diastolic CHF (congestive heart failure) (HCC): 2D echo on 07/18/2021 showed EF of 20-25%.  Patient has no leg edema. Does not seem to have CHF exacerbation. -Hold Lasix due to pancreatitis requiring IV fluid Monitor input and output Closely monitor volume status   Morbid obesity (HCC): Body weight 134.2 kg,  BMI 40.11 -Encourage losing weight -Exercise and healthy diet   DVT ppx: SQ Lovenox   Code Status: Full code     Family Communication: Discussed with patient's son at bedside   Disposition Plan:  Anticipate discharge back to previous environment   Consults called:  none   Subjective:  Patient seen and examined at bedside this morning He tells me his abdominal pain is improving although not completely resolved He has agreed for his diet to be advanced Denies chest pain nausea or vomiting Currently requiring 5 L of intranasal oxygen however uses oxygen as needed at home but not continuously Chest x-ray have been requested  Physical Exam: General: Elderly male laying in bed Heme: No neck lymph node enlargement. Cardiac: S1/S2, RRR, No murmurs, No gallops or rubs. Respiratory: No rales, wheezing, rhonchi or rubs. GI: Mildly tender in the epigastric area GU: No hematuria Ext: No pitting leg edema bilaterally. 1+DP/PT pulse bilaterally. Musculoskeletal: No joint deformities, No joint redness or warmth, no limitation of ROM in spin. Skin: No rashes.  Neuro: Alert, oriented X3, cranial nerves II-XII grossly intact, moves all extremities normally. Psych: Patient is not psychotic, no suicidal or hemocidal ideation.   Labs on Admission: I have personally reviewed following labs and imaging studies  Vitals:   2023/05/22 1003 2023/05/22 1200 22-May-2023 1257 22-May-2023 1544  BP:  127/75  (!) 145/62  Pulse:  88  (!) 103  Resp:  15  17  Temp: 98 F (36.7 C)  98 F (36.7 C) 98.9 F (37.2 C)  TempSrc: Oral  Oral   SpO2:  97%  100%  Weight:      Height:  Data Reviewed:  CT scan of the abdomen reviewed showing findings of acute on chronic pancreatitis    Latest Ref Rng & Units 04/22/2023    4:58 AM 04/21/2023    9:21 AM 12/29/2022    5:19 AM  BMP  Glucose 70 - 99 mg/dL 161  096  045   BUN 8 - 23 mg/dL 25  18  18    Creatinine 0.61 - 1.24 mg/dL 4.09  8.11  9.14   Sodium 135 -  145 mmol/L 141  138  136   Potassium 3.5 - 5.1 mmol/L 3.6  3.7  4.2   Chloride 98 - 111 mmol/L 101  102  99   CO2 22 - 32 mmol/L 32  28  30   Calcium 8.9 - 10.3 mg/dL 8.6  8.4  8.3        Latest Ref Rng & Units 04/21/2023    9:21 AM 12/29/2022    5:19 AM 12/28/2022    9:44 AM  CBC  WBC 4.0 - 10.5 K/uL 7.7  6.5    Hemoglobin 13.0 - 17.0 g/dL 78.2  95.6  21.3   Hematocrit 39.0 - 52.0 % 43.1  46.0  42.0   Platelets 150 - 400 K/uL 150  127        Time spent: 58 minutes  Author: Loyce Dys, MD 04/22/2023 4:37 PM  For on call review [email or web address].

## 2023-04-22 NOTE — ED Notes (Signed)
Pts O2 has desatted to 82-84%, Pt removed 5L [place], Pt stated "I need to breathe on my own sometimes". This RN educated Pt that he will continue to feel SOB until placing his O2 back on, Pt refused. O2 sats remain in the 80s

## 2023-04-23 ENCOUNTER — Encounter: Payer: Self-pay | Admitting: Internal Medicine

## 2023-04-23 LAB — BASIC METABOLIC PANEL
Anion gap: 8 (ref 5–15)
BUN: 23 mg/dL (ref 8–23)
CO2: 31 mmol/L (ref 22–32)
Calcium: 8.1 mg/dL — ABNORMAL LOW (ref 8.9–10.3)
Chloride: 103 mmol/L (ref 98–111)
Creatinine, Ser: 1 mg/dL (ref 0.61–1.24)
GFR, Estimated: >60 mL/min (ref >60)
Glucose, Bld: 110 mg/dL — ABNORMAL HIGH (ref 70–99)
Potassium: 3.6 mmol/L (ref 3.5–5.1)
Sodium: 142 mmol/L (ref 135–145)

## 2023-04-23 LAB — CBC WITH DIFFERENTIAL/PLATELET
Abs Immature Granulocytes: 0.02 10*3/uL (ref 0.00–0.07)
Basophils Absolute: 0 10*3/uL (ref 0.0–0.1)
Basophils Relative: 0 %
Eosinophils Absolute: 0.1 10*3/uL (ref 0.0–0.5)
Eosinophils Relative: 1 %
HCT: 40.2 % (ref 39.0–52.0)
Hemoglobin: 12.1 g/dL — ABNORMAL LOW (ref 13.0–17.0)
Immature Granulocytes: 0 %
Lymphocytes Relative: 16 %
Lymphs Abs: 0.9 10*3/uL (ref 0.7–4.0)
MCH: 27.3 pg (ref 26.0–34.0)
MCHC: 30.1 g/dL (ref 30.0–36.0)
MCV: 90.5 fL (ref 80.0–100.0)
Monocytes Absolute: 0.4 10*3/uL (ref 0.1–1.0)
Monocytes Relative: 7 %
Neutro Abs: 4.1 10*3/uL (ref 1.7–7.7)
Neutrophils Relative %: 76 %
Platelets: 146 10*3/uL — ABNORMAL LOW (ref 150–400)
RBC: 4.44 MIL/uL (ref 4.22–5.81)
RDW: 13.8 % (ref 11.5–15.5)
WBC: 5.4 10*3/uL (ref 4.0–10.5)
nRBC: 0 % (ref 0.0–0.2)

## 2023-04-23 LAB — GLUCOSE, CAPILLARY
Glucose-Capillary: 102 mg/dL — ABNORMAL HIGH (ref 70–99)
Glucose-Capillary: 190 mg/dL — ABNORMAL HIGH (ref 70–99)
Glucose-Capillary: 188 mg/dL — ABNORMAL HIGH (ref 70–99)

## 2023-04-23 MED ORDER — LOSARTAN POTASSIUM 25 MG PO TABS: 25.0000 mg | ORAL_TABLET | Freq: Every day | ORAL | 11 refills | Status: AC

## 2023-04-23 MED ORDER — POLYETHYLENE GLYCOL 3350 17 G PO PACK: 17.0000 g | PACK | Freq: Every day | ORAL | 0 refills | Status: AC | PRN

## 2023-04-23 MED ORDER — SPIRONOLACTONE 25 MG PO TABS: 25.0000 mg | ORAL_TABLET | Freq: Every day | ORAL | 11 refills | Status: AC

## 2023-04-23 MED ORDER — OXYCODONE HCL 5 MG PO TABS
5.0000 mg | ORAL_TABLET | Freq: Once | ORAL | Status: AC
  Administered 2023-04-23: 5 mg via ORAL
  Filled 2023-04-23: qty 1

## 2023-04-23 NOTE — Discharge Summary (Signed)
Physician Discharge Summary   Patient: Don Brown MRN: [id number] DOB: [birth year]  Admit date:     04/21/2023  Discharge date: 04/23/23  Discharge Physician: Loyce Dys   PCP: Sherwood Gambler, MD   Recommendations at discharge:  Follow-up with PCP  Discharge Diagnoses:  Acute on chronic pancreatitis [facility]): Chronic atrial fibrillation (HCC):  Diabetes mellitus without complication (HCC): HLD (hyperlipidemia):  HTN (hypertension), benign Chronic combined systolic and diastolic CHF (congestive heart failure) (HCC): Morbid obesity [facility]):  Hospital Course: Don Brown is a 68 y.o. 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  Lipase elevated and CT scan of the abdomen concerning for acute on chronic pancreatitis.  Abdominal pain resolved and patient's diet advanced and is able to tolerate with no issues.  Patient was weaned off his oxygen.  He tells me he uses oxygen at home intermittently. Patient was seen by PT OT who recommended home health however patient declined.  Patient also requested for additional Dilaudid for discharge however since he is already on oxycodone I deferred any further pain medication to his pain doctor and he agreed.    Consultants: None Procedures performed: None Disposition: Home Diet recommendation:  Carb modified diet DISCHARGE MEDICATION: Allergies as of 04/23/2023       Reactions   Morphine And Codeine Nausea And Vomiting        Medication List     STOP taking these medications    benazepril 10 MG tablet Commonly known as: LOTENSIN       TAKE these medications     APPLE CIDER VINEGAR PO Take 1 tablet by mouth 3 (three) times daily.   aspirin 81 MG tablet Take 81 mg by mouth daily before breakfast.   Carboxymethylcellulose Sod PF 0.5 % Soln Place 1 drop into both eyes 5 (five) times daily.   carvedilol 6.25 MG tablet Commonly known as: COREG Take 3.125 mg by mouth 2 (two) times daily.   Cholecalciferol 50 MCG (2000 UT) Tabs Take 1 tablet by mouth daily.   digoxin 0.125 MG tablet Commonly known as: LANOXIN Take 1 tablet (125 mcg total) by mouth every morning. What changed: when to take this   FreeStyle Libre 2 Sensor Misc 1 Device by Does not apply route every 14 (fourteen) days.   furosemide 80 MG tablet Commonly known as: LASIX Take 80 mg by mouth daily.   gabapentin 600 MG tablet Commonly known as: NEURONTIN Take 0.5 tablets (300 mg total) by mouth 3 (three) times daily. What changed:  how much to take when to take this   GINSENG COMPLEX PO Take 1 tablet by mouth daily.   insulin aspart 100 UNIT/ML injection Commonly known as: novoLOG Inject into the skin 3 (three) times daily before meals. (According to sliding scale)   insulin glargine 100 UNIT/ML injection Commonly known as: LANTUS Inject 40 Units into the skin daily.   ketoconazole 2 % shampoo Commonly known as: NIZORAL Apply 1 Application topically 3 (three) times a week.   losartan 25 MG tablet Commonly known as: Cozaar Take 1 tablet (25 mg total) by mouth daily.   naloxone 4 MG/0.1ML Liqd nasal spray kit Commonly known as: NARCAN Place 1 spray  into the nose as needed.   Omega-3 1000 MG Caps Take 1 capsule by mouth daily.   OXcarbazepine 150 MG tablet Commonly known as: TRILEPTAL Take 150 mg by mouth 2 (two) times daily. Take 2 tablets by mouth 3 times daily for headaches.   Oxycodone HCl 10 MG Tabs Take 10 mg by mouth 4 (four) times daily as needed (chronic pancreatitis).   Pancrelipase (Lip-Prot-Amyl) 24000-76000 units Cpep Take 24,000 Units by  mouth 3 (three) times daily.   lipase/protease/amylase [address] UNITS Cpep capsule Commonly known as: CREON Take 72,000 Units by mouth as directed. TAKE 2 CAPSULES BY MOUTH THREE TIMES A DAY WITH MEALS AND TAKE 1 CAPSULE AS DIRECTED WITH SNACKS AS NEEDED AS DIRECTED   polyethylene glycol 17 g packet Commonly known as: MIRALAX / GLYCOLAX Take 17 g by mouth daily as needed for mild constipation.   sildenafil 100 MG tablet Commonly known as: VIAGRA Take 50 mg by mouth daily as needed for erectile dysfunction.   spironolactone 25 MG tablet Commonly known as: Aldactone Take 1 tablet (25 mg total) by mouth daily.   triamcinolone cream 0.1 % Commonly known as: KENALOG Apply 1 Application topically 2 (two) times daily as needed (rash).   TURMERIC PO Take 1 tablet by mouth daily.   VITAMIN B 12 PO Take 1 tablet by mouth daily.               Durable Medical Equipment  (From admission, onward)           Start     Ordered   04/23/23 1523  For home use only DME Cane  Once        04/23/23 1523            Discharge Exam: Filed Weights   04/22/23 0235 04/23/23 0500  Weight: 118.8 kg 126.5 kg   General: Elderly male laying in bed Heme: No neck lymph node enlargement. Cardiac: S1/S2, RRR, No murmurs, No gallops or rubs. Respiratory: No rales, wheezing, rhonchi or rubs. GI: No tenderness GU: No hematuria Ext: No pitting leg edema bilaterally. 1+DP/PT pulse bilaterally. Musculoskeletal: No joint deformities, No joint redness or warmth, no limitation of ROM in spin. Skin: No rashes.  Neuro: Alert, oriented X3 Psych: Patient is not psychotic, no suicidal or hemocidal ideation.    Condition at discharge: good  The results of significant diagnostics from this hospitalization (including imaging, microbiology, ancillary and laboratory) are listed below for reference.   Imaging Studies: DG Chest Port 1 View Result Date: 04/22/2023 CLINICAL DATA:  Respiratory failure  EXAM: PORTABLE CHEST 1 VIEW COMPARISON:  12/28/2022 FINDINGS: Mildly degraded exam due to AP portable technique and patient body habitus. Midline trachea. Moderate cardiomegaly. Suspect layering bilateral pleural effusions. No pneumothorax. Interstitial prominence and indistinctness. Bibasilar airspace disease. IMPRESSION: Mild technique degradation as detailed above. Cardiomegaly with probable mild interstitial edema. Probable layering bilateral pleural effusions. Bibasilar airspace disease could represent atelectasis or infection. Electronically Signed   By: Jeronimo Greaves M.D.   On: 04/22/2023 17:49   CT ABDOMEN WO CONTRAST Result Date: 04/21/2023 CLINICAL DATA:  Chronic pancreatitis. EXAM: CT ABDOMEN WITHOUT CONTRAST TECHNIQUE: Multidetector CT imaging of the abdomen was performed following the standard protocol without IV contrast. RADIATION DOSE REDUCTION: This exam was performed according to the departmental dose-optimization program which includes automated exposure control, adjustment of the mA and/or kV according to patient size and/or use of iterative reconstruction technique. COMPARISON:  CT 12/27/2022 FINDINGS: Lower chest: Bands of linear atelectasis in  the RIGHT lung base. Hepatobiliary: 2 low-density lesions in the liver have simple fluid attenuation consistent benign cysts. No biliary duct dilatation. Postcholecystectomy. Pancreas: There is marked dilatation of the pancreatic duct through the body and tail the pancreas. Duct dilatation up to 2 cm compared to 18 mm on prior. There is speckled calcifications along the body and tail the pancreas. There is atrophy in the tail the pancreas. Head of pancreas relatively normal. There is no peripancreatic inflammation or fluid collections. Spleen: Normal spleen Adrenals/urinary tract: Adrenal glands and kidneys are normal. The ureters and bladder normal. Stomach/Bowel: Stomach duodenum and limited view of the bowel is unremarkable. Vascular/Lymphatic:  There venous collaterals along the body of the stomach. No abdominal adenopathy Reproductive: Other: No free fluid. Musculoskeletal: No aggressive osseous lesion. IMPRESSION: 1. Pancreatic duct dilatation is mildly increased from comparison exam. Findings concerning for acute on chronic pancreatitis. Minimal peripancreatic inflammation. No organized fluid collections. 2. Venous collaterals about the stomach suggest portal hypertension. Spleen is normal volume. Electronically Signed   By: Genevive Bi M.D.   On: 04/21/2023 20:06    Microbiology: Results for orders placed or performed during the hospital encounter of 05/08/21  Resp Panel by RT-PCR (Flu A&B, Covid) Nasopharyngeal Swab     Status: None   Collection Time: 05/08/21  2:30 AM   Specimen: Nasopharyngeal Swab; Nasopharyngeal(NP) swabs in vial transport medium  Result Value Ref Range Status   SARS Coronavirus 2 by RT PCR NEGATIVE NEGATIVE Final    Comment: (NOTE) SARS-CoV-2 target nucleic acids are NOT DETECTED.  The SARS-CoV-2 RNA is generally detectable in upper respiratory specimens during the acute phase of infection. The lowest concentration of SARS-CoV-2 viral copies this assay can detect is 138 copies/mL. A negative result does not preclude SARS-Cov-2 infection and should not be used as the sole basis for treatment or other patient management decisions. A negative result may occur with  improper specimen collection/handling, submission of specimen other than nasopharyngeal swab, presence of viral mutation(s) within the areas targeted by this assay, and inadequate number of viral copies(<138 copies/mL). A negative result must be combined with clinical observations, patient history, and epidemiological information. The expected result is Negative.  Fact Sheet for Patients:  BloggerCourse.com  Fact Sheet for Healthcare Providers:  SeriousBroker.it  This test is no t yet  approved or cleared by the [place] FDA and  has been authorized for detection and/or diagnosis of SARS-CoV-2 by FDA under an Emergency Use Authorization (EUA). This EUA will remain  in effect (meaning this test can be used) for the duration of the COVID-19 declaration under Section 564(b)(1) of the Act, 21 U.S.C.section 360bbb-3(b)(1), unless the authorization is terminated  or revoked sooner.       Influenza A by PCR NEGATIVE NEGATIVE Final   Influenza B by PCR NEGATIVE NEGATIVE Final    Comment: (NOTE) The Xpert Xpress SARS-CoV-2/FLU/RSV plus assay is intended as an aid in the diagnosis of influenza from Nasopharyngeal swab specimens and should not be used as a sole basis for treatment. Nasal washings and aspirates are unacceptable for Xpert Xpress SARS-CoV-2/FLU/RSV testing.  Fact Sheet for Patients: BloggerCourse.com  Fact Sheet for Healthcare Providers: SeriousBroker.it  This test is not yet approved or cleared by the [place] FDA and has been authorized for detection and/or diagnosis of SARS-CoV-2 by FDA under an Emergency Use Authorization (EUA). This EUA will remain in effect (meaning this test can be used) for the duration of the COVID-19 declaration under Section 564(b)(1) of  the Act, 21 U.S.C. section 360bbb-3(b)(1), unless the authorization is terminated or revoked.  Performed at [facility], [address] [place], [place] [address]     Labs: CBC: Recent Labs  Lab 04/21/23 [phone] 04/23/23 0534  WBC 7.7 5.4  NEUTROABS  --  4.1  HGB 13.4 12.1*  HCT 43.1 40.2  MCV 87.6 90.5  PLT 150 146*   Basic Metabolic Panel: Recent Labs  Lab 04/21/23 0921 04/22/23 0458 04/23/23 0534  NA 138 141 142  K 3.7 3.6 3.6  CL 102 101 103  CO2 28 32 31  GLUCOSE 318* 289* 110*  BUN 18 25* 23  CREATININE 0.77 1.17 1.00  CALCIUM 8.4* 8.6* 8.1*   Liver Function Tests: Recent Labs  Lab  04/21/23 0921  AST 19  ALT 17  ALKPHOS 54  BILITOT 1.0  PROT 8.3*  ALBUMIN 3.1*   CBG: Recent Labs  Lab 04/22/23 1151 04/22/23 1620 04/22/23 2229 04/23/23 0825 04/23/23 1222  GLUCAP 201* 264* 157* 102* 190*    Discharge time spent:  33 minutes.  Signed: Loyce Dys, MD [facility] 04/23/2023

## 2023-04-23 NOTE — Progress Notes (Signed)
Heart Failure Stewardship Pharmacy Note  PCP: Sherwood Gambler, MD PCP-Cardiologist: None  HPI: Don Brown is a 68 y.o. male with chronic pancreatitis, pancreatic fistula, s/p of partial resection for pancreas with a big scarr in abdomen, hypertension, hyperlipidemia, diabetes mellitus, depression, anxiety, thrombocytopenia, OSA not on CPAP, CHF with EF 20-25%, atrial fibrillation on digoxin (not on anticoagulants), anemia, on 2 L oxygen at home who presented with abdominal pain, nausea, and vomiting. Found to have pancreatitis flare. BNP on admission was 315.8. Chest XR showed probable mild interstitial edema and probably layering bilateral pleural effusions. Patient reported running low on pancreatic enzymes and was skipping doses prior to admission.   Pertinent cardiac history: Reported having rheumatic fever as a child, but no valvular issues noted on most recent echo, though they were not well visualized. Reports longstanding history of atrial fibrillation that began at 68 years old. Echo in 12/2010 with LVEF of 40-45%, moderate LVH. LVEF in 04/2013 reduced to 20-25% and unchanged in 06/2021. RV function low normal in 06/2021.   Pertinent Lab Values: Creatinine, Ser  Date Value Ref Range Status  04/23/2023 1.00 0.61 - 1.24 mg/dL Final   BUN  Date Value Ref Range Status  04/23/2023 23 8 - 23 mg/dL Final   Potassium  Date Value Ref Range Status  04/23/2023 3.6 3.5 - 5.1 mmol/L Final   Sodium  Date Value Ref Range Status  04/23/2023 142 135 - 145 mmol/L Final   B Natriuretic Peptide  Date Value Ref Range Status  04/22/2023 315.8 (H) 0.0 - 100.0 pg/mL Final    Comment:    Performed at [facility], [address] [place], [place] [address]   Magnesium  Date Value Ref Range Status  07/20/2021 2.0 1.7 - 2.4 mg/dL Final    Comment:    Performed at [facility], [address] [place], [place] [address]   Hgb A1c MFr Bld  Date Value Ref Range Status   12/28/2022 10.3 (H) 4.8 - 5.6 % Final    Comment:    (NOTE) Pre diabetes:          5.7%-6.4%  Diabetes:              >6.4%  Glycemic control for   <7.0% adults with diabetes    Digoxin Level  Date Value Ref Range Status  12/28/2022 0.5 (L) 0.8 - 2.0 ng/mL Final    Comment:    Performed at [facility], [address], [place], [place] [address]   TSH  Date Value Ref Range Status  05/06/2014 0.955 0.350 - 4.500 uIU/mL Final   LDH  Date Value Ref Range Status  05/07/2013 265 (H) 94 - 250 U/L Final    Comment:    SLIGHT HEMOLYSIS    Vital Signs: Admission weight: 278.88 lbs Temp:  [98 F (36.7 C)-98.9 F (37.2 C)] 98.7 F (37.1 C) (01/30 2303) Pulse Rate:  [85-103] 85 (01/30 2303) Cardiac Rhythm: Atrial fibrillation (01/30 1717) Resp:  [14-20] 20 (01/30 2303) BP: (111-148)/(62-77) 111/66 (01/30 2303) SpO2:  [92 %-100 %] 99 % (01/30 2303) Weight:  [126.5 kg (278 lb 14.1 oz)] 126.5 kg (278 lb 14.1 oz) (01/31 0500)  Intake/Output Summary (Last 24 hours) at 04/23/2023 0735 Last data filed at 04/23/2023 0500 Gross per 24 hour  Intake 1327.94 ml  Output 850 ml  Net 477.94 ml    Current Heart Failure Medications:  Loop diuretic: furosemide 80 mg daily Beta-Blocker: carvedilol 3.125 mg twice  daily ACEI/ARB/ARNI: none MRA: none SGLT2i: none Other: digoxin 0.125 mg daily  Prior to admission Heart Failure Medications:  Loop diuretic: furosemide 80 mg daily Beta-Blocker: carvedilol 3.125 mg twice daily ACEI/ARB/ARNI: benazepril 10 mg daily MRA: none SGLT2i: none Other: digoxin 0.125 mg daily  Assessment: 1. Acute on chronic systolic heart failure (LVEF 20-25%) with low-normal RV function, due to presumed NICM. NYHA class III-IV symptoms.  -Symptoms: Reports shortness of breath with minimal exertion -Volume: Appears mildly volume overloaded, but difficult to asses given body habitus. Oral furosemide resumed.   -Hemodynamics: BP is elevated while benazepril  is being held for the possibility of contributing to pancreatitis. -BB: Continue carvedilol 3.125 mg twice dialy -ACEI/ARB/ARNI: Consider starting losartan 25 mg daily. ARBs do not carry the same pancreatitis risk as ACEis. -MRA: Consider starting spironolactone 25 mg daily -SGLT2i: Avoid SGLT2i for now given A1c >10.  Plan: 1) Medication changes recommended at this time: -Consider starting spironolactone 25 mg daily -Consider transitioning benazepril to losartan at discharge.  2) [facility] assistance: [facility]  3) Education: - Patient has been educated on current HF medications and potential additions to HF medication regimen - Patient verbalizes understanding that over the next few months, these medication doses may change and more medications may be added to optimize HF regimen - Patient has been educated on basic disease state pathophysiology and goals of therapy  Medication Assistance / Insurance Benefits Check: Does the patient have prescription insurance?    Type of insurance plan:  Does the patient qualify for medication assistance through manufacturers or grants? No   Outpatient Pharmacy[facility]     Please do not hesitate to reach out with questions or concerns,  Enos Fling, PharmD, CPP, BCPS Heart Failure Pharmacist  Phone - [phone] 04/23/2023 11:56 AM

## 2023-04-23 NOTE — TOC Progression Note (Signed)
Transition of Care [facility]) - Progression Note    Patient Details  Name: Don Brown MRN: [id number] Date of Birth: 1955/06/01  [facility] CM/SW Contact  Marlowe Sax, RN Phone Number: 04/23/2023, 4:36 PM  Clinical Narrative:    Heavy duty cane to be delivered by [employer]  Expected Discharge Plan: Home/Self Care Barriers to Discharge: Continued Medical Work up  Expected Discharge Plan and Services   Discharge Planning Services: CM Consult   Living arrangements for the past 2 months: Single Family Home Expected Discharge Date: 04/23/23               DME Arranged: N/A DME Agency: NA       HH Arranged: NA           Social Determinants of Health (SDOH) Interventions SDOH Screenings   Food Insecurity: No Food Insecurity (04/22/2023)  Housing: Low Risk  (04/23/2023)  Transportation Needs: No Transportation Needs (04/23/2023)  Utilities: Not At Risk (04/22/2023)  Financial Resource Strain: High Risk (04/23/2023)  Social Connections: Moderately Isolated (04/22/2023)  Tobacco Use: Low Risk  (04/23/2023)    Readmission Risk Interventions     No data to display

## 2023-04-23 NOTE — TOC Initial Note (Signed)
Transition of Care [facility]) - Initial/Assessment Note    Patient Details  Name: Don Brown MRN: [id number] Date of Birth: [birth year]  Transition of Care [facility]) CM/SW Contact:    Marlowe Sax, RN Phone Number: 04/23/2023, 2:18 PM  Clinical Narrative:                  I met with the patient and his family in the room, he reports that he lives at home with his family, they provide assistance for him and provide transportation He has oxygen at home and a cane, provided by the [place], he reports that he wants things that the [place] wont cover I explained that I can only get things thru the [place] if they are paying for it and I can not get anything not covered unless he pays for it, He stated frustration that the [place] does not cover things such as a portable oxygen concentrator and a walk in Tub, I explained I could not get anything for him that is not covered unless he wants to pay for it.  I encouraged him to let his nurse know if he has additional needs that I can help with  He and his family stated understanding   Expected Discharge Plan: Home/Self Care Barriers to Discharge: Continued Medical Work up   Patient Goals and CMS Choice            Expected Discharge Plan and Services   Discharge Planning Services: CM Consult   Living arrangements for the past 2 months: Single Family Home Expected Discharge Date: 04/23/23               DME Arranged: N/A DME Agency: NA       HH Arranged: NA          Prior Living Arrangements/Services Living arrangements for the past 2 months: Single Family Home Lives with:: Self, Adult Children, Significant Other, Minor Children Patient language and need for interpreter reviewed:: Yes Do you feel safe going back to the place where you live?: Yes      Need for Family Participation in Patient Care: Yes (Comment)   Current home services: DME (oxygen, cane) Criminal Activity/Legal Involvement Pertinent to Current Situation/Hospitalization: No -  Comment as needed  Activities of Daily Living   ADL Screening (condition at time of admission) Independently performs ADLs?: No Does the patient have a NEW difficulty with bathing/dressing/toileting/self-feeding that is expected to last >3 days?: No Does the patient have a NEW difficulty with getting in/out of bed, walking, or climbing stairs that is expected to last >3 days?: No Does the patient have a NEW difficulty with communication that is expected to last >3 days?: No Is the patient deaf or have difficulty hearing?: No Does the patient have difficulty seeing, even when wearing glasses/contacts?: No Does the patient have difficulty concentrating, remembering, or making decisions?: No  Permission Sought/Granted   Permission granted to share information with : Yes, Verbal Permission Granted              Emotional Assessment Appearance:: Appears stated age Attitude/Demeanor/Rapport: Engaged Affect (typically observed): Pleasant Orientation: : Oriented to Self, Oriented to Place, Oriented to  Time, Oriented to Situation Alcohol / Substance Use: Not Applicable Psych Involvement: No (comment)  Admission diagnosis:  Acute recurrent pancreatitis [K85.90] Acute on chronic pancreatitis (HCC) [K85.90, K86.1] Patient Active Problem List   Diagnosis Date Noted   Acute recurrent pancreatitis 04/21/2023   Opioid overdose (HCC) 12/29/2022   Weakness generalized  12/28/2022   Acute on chronic pancreatitis (HCC) 11/26/2021   HLD (hyperlipidemia) 11/26/2021   Diabetes mellitus without complication (HCC) 11/26/2021   Acute on chronic respiratory failure with hypoxemia (HCC) 07/19/2021   Noncompliance with medications 07/19/2021   Acute on chronic systolic CHF (congestive heart failure) (HCC) 07/18/2021   Depression    Recurrent ventral incisional hernia 04/05/2018   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   Thrombocytopenia (HCC) 01/05/2015   Chronic pain 05/07/2014   Type 2 diabetes mellitus  (HCC) 05/07/2014   Bradycardia 05/07/2014   Streptococcal infection 05/07/2014   Abdominal pain 05/06/2014   HTN (hypertension), benign    Chest pain 02/05/2014   Chronic atrial fibrillation (HCC) 02/05/2014   Diabetes mellitus type 2, controlled (HCC) 02/05/2014   Pancreatitis 10/03/2013   Dermatomyositis (HCC) 10/03/2013   Acute pancreatitis 10/03/2013   Acute on chronic combined systolic and diastolic CHF (congestive heart failure) (HCC) 07/07/2013   Morbid obesity (HCC) 07/07/2013   Chronic combined systolic and diastolic CHF (congestive heart failure) (HCC) 07/07/2013   Hyperkalemia 05/14/2013   AKI (acute kidney injury) (HCC) 05/13/2013   Pancreatitis, acute 05/10/2013   Fever 05/03/2013   Hyperosmolar non-ketotic state in patient with type 2 diabetes mellitus (HCC) 05/01/2013   Lactic acidosis 05/01/2013   Delirium, acute 05/01/2013   Chronic pancreatitis (HCC) 05/01/2013   Acute respiratory failure (HCC) 05/01/2013   Recurrent incisional hernia with obstruction 12/29/2012   Nonischemic cardiomyopathy (HCC) 07/14/2012   Atrial fibrillation (HCC) 01/14/2011   Acute on chronic combined systolic and diastolic heart failure 01/14/2011   Valvular heart disease 01/14/2011   PCP:  Sherwood Gambler, MD Pharmacy:   [id number] - [place] - [address] [place] [address] Phone: [phone] Fax: [phone]  [facility] - [place], [place] - [address] [place] [address] Phone: [phone] Fax: [phone]  [facility] - [place], [place] - [address] [place] [address] Phone: [phone] Fax: [phone]  [facility] [address] [place] - [address] [place] [address] Phone: [phone] Fax: [phone]     Social Drivers of Health (SDOH) Social  History: SDOH Screenings   Food Insecurity: No Food Insecurity (04/22/2023)  Housing: Low Risk  (04/23/2023)  Transportation Needs: No Transportation Needs (04/23/2023)  Utilities: Not At Risk (04/22/2023)  Financial Resource Strain: High Risk (04/23/2023)  Social Connections: Moderately Isolated (04/22/2023)  Tobacco Use: Low Risk  (04/23/2023)   SDOH Interventions: Housing Interventions: Intervention Not Indicated Transportation Interventions: Intervention Not Indicated Financial Strain Interventions: Intervention Not Indicated   Readmission Risk Interventions     No data to display

## 2023-04-23 NOTE — Evaluation (Signed)
Physical Therapy Evaluation Patient Details Name: Don Brown MRN: [id number] DOB: June 20, 1955 Today's Date: 04/23/2023  History of Present Illness  Don Brown is a 68 y.o. 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   Clinical Impression  Pt admitted with above diagnosis. Pt currently with functional limitations due to the deficits listed below (see PT Problem List). Pt received upright in bed agreeable to PT with son and daughter present. Per pt, PTA pt reliant on son for physical assist around home, ambulate using SPC in house and scooter for community ADL's.   To date, minA for bed mobility but otherwise pt reliant on CGA for STS and ambulation. Completed one 40' bout and one 60' bout with seated rest b/t. Provided chair follow for safety as pt is mildly impulsive and has anterior trunk lean and SOB with mobility. Overall pt and family report pt is ambulating at baseline levels. Feels comfortable with return home. Encouraged use of O2 and how it can assist with energy conservation and overall endurance with functional activity. Pt understanding with all needs in reach. Pt appropriate for discharge recs to address deficits in muscle weakness, limited endurance, and gait abnormality to reduce risk for falls in home setting and maximize independence.       If plan is discharge home, recommend the following: A little help with walking and/or transfers;Assist for transportation;Help with stairs or ramp for entrance;A little help with bathing/dressing/bathroom   Can travel by private vehicle        Equipment  Recommendations Gilmer Mor (bariatric)  Recommendations for Other Services       Functional Status Assessment Patient has had a recent decline in their functional status and demonstrates the ability to make significant improvements in function in a reasonable and predictable amount of time.     Precautions / Restrictions Precautions Precautions: Fall Restrictions Weight Bearing Restrictions Per Provider Order: No      Mobility  Bed Mobility Overal bed mobility: Needs Assistance Bed Mobility: Supine to Sit     Supine to sit: Min assist, HOB elevated     General bed mobility comments: uses son at baseline for minA for bed level and seated transfers. Patient Response: Cooperative  Transfers Overall transfer level: Needs assistance Equipment used: Straight cane Transfers: Sit to/from Stand Sit to Stand: Contact guard assist                Ambulation/Gait Ambulation/Gait assistance: Contact guard assist Gait Distance (Feet): 100 Feet (40' +60' with seated rest b/t bouts. Chair follow for safety.) Assistive device: Straight cane Gait Pattern/deviations: Step-to pattern, Wide base of support, Trunk flexed       General Gait Details: Very laborious gait with wide BOS, limited foot clearance. safe and correct 3 point pattern with spc.  Stairs            Wheelchair Mobility     Tilt Bed Tilt Bed Patient Response: Cooperative  Modified Rankin (Stroke Patients Only)       Balance Overall balance assessment: Needs assistance Sitting-balance support: No upper extremity supported, Feet supported Sitting balance-Leahy Scale: Good     Standing balance support: Single extremity supported, Reliant on assistive device for balance Standing  balance-Leahy Scale: Poor Standing balance comment: Reliant on counter and SPC for support in standing.                             Pertinent Vitals/Pain Pain Assessment Pain Assessment: Faces Faces Pain Scale:  Hurts a little bit Pain Location: BLE's Pain Descriptors / Indicators: Burning, Sharp Pain Intervention(s): Premedicated before session    Home Living Family/patient expects to be discharged to:: Private residence Living Arrangements: Children Available Help at Discharge: Family;Available 24 hours/day Type of Home: House Home Access: Stairs to enter Entrance Stairs-Rails: None Entrance Stairs-Number of Steps: 1   Home Layout: One level Home Equipment: Rollator (4 wheels);Cane - single point;Electric scooter;Toilet riser (SPC is bent and unsafe) Additional Comments: has lift chair    Prior Function Prior Level of Function : Needs assist             Mobility Comments: walks in the home with cane and son assist; scooter does not fit in the home. uses scooter for community tasks.       Extremity/Trunk Assessment   Upper Extremity Assessment Upper Extremity Assessment: Generalized weakness    Lower Extremity Assessment Lower Extremity Assessment: Generalized weakness       Communication   Communication Communication: No apparent difficulties Cueing Techniques: Verbal cues  Cognition Arousal: Alert Behavior During Therapy: WFL for tasks assessed/performed Overall Cognitive Status: Within Functional Limits for tasks assessed                                          General Comments General comments (skin integrity, edema, etc.): SPO2 on RA desat to 88%. Placed back on 2 L/min with SPo2 at 92-94% with rest and after ambulation.    Exercises Other Exercises Other Exercises: energy conservation techniques, need for O2 for OOB mobility.   Assessment/Plan    PT Assessment Patient needs continued PT services  PT Problem List Decreased strength;Decreased mobility;Decreased safety awareness;Decreased activity tolerance;Cardiopulmonary status limiting activity;Decreased balance;Obesity       PT Treatment Interventions DME instruction;Therapeutic  exercise;Gait training;Balance training;Stair training;Neuromuscular re-education;Functional mobility training;Therapeutic activities;Patient/family education    PT Goals (Current goals can be found in the Care Plan section)  Acute Rehab PT Goals Patient Stated Goal: Return home PT Goal Formulation: With patient Time For Goal Achievement: 05/07/23 Potential to Achieve Goals: Good    Frequency Min 1X/week     Co-evaluation               AM-PAC PT "6 Clicks" Mobility  Outcome Measure Help needed turning from your back to your side while in a flat bed without using bedrails?: A Lot Help needed moving from lying on your back to sitting on the side of a flat bed without using bedrails?: A Little Help needed moving to and from a bed to a chair (including a wheelchair)?: A Little Help needed standing up from a chair using your arms (e.g., wheelchair or bedside chair)?: A Little Help needed to walk in hospital room?: A Little Help needed climbing 3-5 steps with a railing? : A Lot 6 Click Score: 16    End of Session Equipment Utilized During Treatment: Gait belt;Oxygen Activity Tolerance: Patient tolerated treatment well Patient left: in chair;with call bell/phone within reach;with family/visitor present;with nursing/sitter in room;with chair alarm set Nurse Communication: Mobility status PT Visit Diagnosis: Unsteadiness  on feet (R26.81);Difficulty in walking, not elsewhere classified (R26.2);Muscle weakness (generalized) (M62.81)    Time: 1442-1500 PT Time Calculation (min) (ACUTE ONLY): 18 min   Charges:   PT Evaluation $PT Eval Moderate Complexity: 1 Mod PT Treatments $Therapeutic Activity: 8-22 mins PT General Charges $$ ACUTE PT VISIT: 1 Visit        Delphia Grates. Fairly IV, PT, DPT Physical Therapist- [place]  [facility]  04/23/2023, 3:21 PM

## 2023-04-23 NOTE — Progress Notes (Signed)
Heart Failure Nurse Navigator Progress Note  PCP: Borum, Vista Mink, MD PCP-Cardiologist: [facility] Admission Diagnosis: Acute on chronic pancreatitis [facility]) Admitted from: Home  Presentation:   Don Brown presented with severe epigastric pain, nausea, chills and vomiting. BNP 315.8. Chest XR showed probable mild interstitial edema and probably layering bilateral pleural effusions.  ECHO/ LVEF: 20-25%  Clinical Course:  Past Medical History:  Diagnosis Date   Anemia    Anxiety    Atrial fibrillation (HCC)    Cervical compression fracture (HCC)    Cervical disk disease requiring surgery   CHF (congestive heart failure) (HCC)    Chronic pain    Chronic systolic heart failure (HCC)    LVEF 40-45%   Degenerative joint disease    Depression    Essential hypertension, benign    Headache    "stress"   Heart murmur    History of blood transfusion 2007   "related to OR"   History of renal failure    Requiring hemodialysis in the past with normal renal function at this point   History of ventral hernia repair    Hyperlipidemia    MGUS (monoclonal gammopathy of unknown significance)    "or myelona" (05/07/2014)   Mitral valve prolapse    Morbid obesity (HCC)    Myeloma (HCC)    "or MGUS" (05/07/2014)   Narcotic dependence, episodic use (HCC)    On home oxygen therapy    "2L prn" (05/07/2014)   Pancreatitis    Severe pancreatitis status post surgery with 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 status post multiple endoscopic drainage procedures and stents    Respiratory failure (HCC)    History of ventilatory-dependent respiratory failure and tracheostomy    Sleep apnea    "don't wear mask" (05/07/2014)   Type 2 diabetes mellitus (HCC)      Social History   Socioeconomic History   Marital status: Single    Spouse name: Not on file   Number of children: 4   Years of education: Not on file   Highest education level: Some college, no degree   Occupational History   Not on file  Tobacco Use   Smoking status: Never   Smokeless tobacco: Never  Vaping Use   Vaping status: Never Used  Substance and Sexual Activity   Alcohol use: No   Drug use: Yes    Comment: prescribed dilaudid, oxycodone   Sexual activity: Not Currently  Other Topics Concern   Not on file  Social History Narrative   Not on file   Social Drivers of Health   Financial Resource Strain: High Risk (04/23/2023)   Overall Financial Resource Strain (CARDIA)    Difficulty of Paying Living Expenses: Hard  Food Insecurity: No Food Insecurity (04/22/2023)   Hunger Vital Sign    Worried About Running Out of Food in the Last Year: Never true    Ran Out of Food in the Last Year: Never true  Transportation Needs: No Transportation Needs (04/23/2023)   PRAPARE - Administrator, Civil Service (Medical): No    Lack of Transportation (Non-Medical): No  Physical Activity: Not on file  Stress: Not on file  Social Connections: Moderately Isolated (04/22/2023)   Social Connection and Isolation Panel [NHANES]    Frequency of Communication with Friends and Family: More than three times a week    Frequency of Social Gatherings with Friends and Family: Twice a week    Attends Religious Services: Never  Active Member of Clubs or Organizations: No    Attends Banker Meetings: Never    Marital Status: Living with partner   Education Assessment and Provision:  Detailed education and instructions provided on heart failure disease management including the following:  Signs and symptoms of Heart Failure When to call the physician Importance of daily weights Low sodium diet Fluid restriction Medication management Anticipated future follow-up appointments  Patient education given on each of the above topics.  Patient acknowledges understanding via teach back method and acceptance of all instructions.  Education Materials:  "Living Better With Heart  Failure" Booklet, HF zone tool, & Daily Weight Tracker Tool.  Patient has scale at home: Yes Patient has pill box at home: Yes    High Risk Criteria for Readmission and/or Poor Patient Outcomes: Heart failure hospital admissions (last 6 months): 0  No Show rate: 7 Difficult social situation: None Demonstrates medication adherence: Yes Primary Language: English Literacy level: Reading, Writing & Comprehension  Barriers of Care:   None  Considerations/Referrals:   Referral made to Heart Failure Pharmacist Stewardship: Yes Referral made to Heart Failure CSW/NCM TOC: Pt prefers to go to the [place] for 1 week follow-up appointment Referral made to [facility] Case Management per pt request: notified Marlowe Sax, RN  Items for Follow-up on DC/TOC: Diet & Fluid Restrictions Daily Weights Medication Compliance Continued Heart Failure Education  Roxy Horseman, RN, BSN [facility] Heart Failure Navigator Secure Chat Only

## 2023-04-23 NOTE — Plan of Care (Signed)
Patient alert and oriented. Complained of headache, relieved with ordered medication. Patient without acute distress tonight. All needs attended to. Will continue to monitor.   Problem: Fluid Volume: Goal: Ability to maintain a balanced intake and output will improve Outcome: Progressing   Problem: Metabolic: Goal: Ability to maintain appropriate glucose levels will improve Outcome: Progressing   Problem: Tissue Perfusion: Goal: Adequacy of tissue perfusion will improve Outcome: Progressing   Problem: Clinical Measurements: Goal: Ability to maintain clinical measurements within normal limits will improve Outcome: Progressing   Problem: Activity: Goal: Risk for activity intolerance will decrease Outcome: Progressing   Problem: Pain Managment: Goal: General experience of comfort will improve and/or be controlled Outcome: Progressing   Problem: Safety: Goal: Ability to remain free from injury will improve Outcome: Progressing

## 2023-04-23 NOTE — Plan of Care (Signed)
RN was called into the room by nurse tech. Tech reports patient walked to the bathroom and back to bed but became very winded and she was worried about his safety when walking to the bathroom in the future as patient. RN went into patients room where tech was assessing the patients O2 levels and vital signs, all WNL. RN explained to patient that because of his need for oxygen and his pending physical therapy evaluation that for his safety he should be using the bedside commode until futher notice instead of walking to the bathroom as he is at a high risk for falling. Patient became very defensive and stated that he will not be using a bedside commode. Patient stated " I know what I need to do, I need to push myself". Patient stated that he needs a handicap seat in the bathroom because the toilet is too low. Patient stated that if he had the seat he wouldn't be so winded and getting back to bed would not be a problem. RN went into restroom and noted that a bariatric raised commode is already over the toilet and patient has been using it already. RN again tried to educate patient on the issue of his safety and the need to use bedside commode instead of walking to the bathroom until PT could work with him. He continued to argue with RN stating " aren't you listening to me I told you the problem, I am not using that thing." RN did not attempt further education to prevent patient from becoming increasingly aggitated. Bed alarm was turned on and RN and nurse tech left patients room as his meal tray and family arrived.
